# Patient Record
Sex: Female | Born: 1970 | Race: White | Hispanic: No | Smoking: Never smoker
Health system: Southern US, Community
[De-identification: ages and names within clinical notes are randomized; demographics above are authoritative.]

## PROBLEM LIST (undated history)

## (undated) DIAGNOSIS — I219 Acute myocardial infarction, unspecified: Secondary | ICD-10-CM

## (undated) DIAGNOSIS — I2510 Atherosclerotic heart disease of native coronary artery without angina pectoris: Secondary | ICD-10-CM

## (undated) DIAGNOSIS — W57XXXA Bitten or stung by nonvenomous insect and other nonvenomous arthropods, initial encounter: Secondary | ICD-10-CM

## (undated) DIAGNOSIS — I739 Peripheral vascular disease, unspecified: Secondary | ICD-10-CM

## (undated) DIAGNOSIS — E119 Type 2 diabetes mellitus without complications: Secondary | ICD-10-CM

## (undated) DIAGNOSIS — I10 Essential (primary) hypertension: Secondary | ICD-10-CM

## (undated) HISTORY — DX: Bitten or stung by nonvenomous insect and other nonvenomous arthropods, initial encounter: W57.XXXA

## (undated) HISTORY — PX: CORONARY ANGIOPLASTY WITH STENT PLACEMENT: SHX49

---

## 2003-11-19 ENCOUNTER — Emergency Department: Payer: Self-pay | Admitting: Emergency Medicine

## 2003-11-25 ENCOUNTER — Ambulatory Visit: Payer: Self-pay | Admitting: Obstetrics & Gynecology

## 2003-12-28 ENCOUNTER — Emergency Department: Payer: Self-pay | Admitting: Emergency Medicine

## 2004-03-22 ENCOUNTER — Emergency Department: Payer: Self-pay | Admitting: Emergency Medicine

## 2005-03-11 ENCOUNTER — Emergency Department: Payer: Self-pay | Admitting: General Practice

## 2006-12-28 ENCOUNTER — Emergency Department: Payer: Self-pay | Admitting: Emergency Medicine

## 2006-12-28 ENCOUNTER — Other Ambulatory Visit: Payer: Self-pay

## 2007-03-19 ENCOUNTER — Emergency Department: Payer: Self-pay | Admitting: Emergency Medicine

## 2009-06-16 DIAGNOSIS — E1142 Type 2 diabetes mellitus with diabetic polyneuropathy: Secondary | ICD-10-CM | POA: Insufficient documentation

## 2009-06-16 DIAGNOSIS — F1010 Alcohol abuse, uncomplicated: Secondary | ICD-10-CM | POA: Insufficient documentation

## 2009-06-16 DIAGNOSIS — F1210 Cannabis abuse, uncomplicated: Secondary | ICD-10-CM | POA: Insufficient documentation

## 2009-08-28 DIAGNOSIS — D234 Other benign neoplasm of skin of scalp and neck: Secondary | ICD-10-CM | POA: Insufficient documentation

## 2011-07-04 DIAGNOSIS — J309 Allergic rhinitis, unspecified: Secondary | ICD-10-CM | POA: Insufficient documentation

## 2011-07-04 DIAGNOSIS — Z1239 Encounter for other screening for malignant neoplasm of breast: Secondary | ICD-10-CM | POA: Insufficient documentation

## 2011-09-13 ENCOUNTER — Ambulatory Visit: Payer: Self-pay

## 2012-04-23 ENCOUNTER — Emergency Department: Payer: Self-pay | Admitting: Internal Medicine

## 2012-04-23 LAB — URINALYSIS, COMPLETE
Bacteria: NONE SEEN
Ph: 5 (ref 4.5–8.0)
Protein: NEGATIVE
Squamous Epithelial: 2
WBC UR: 1 /HPF (ref 0–5)

## 2013-10-05 DIAGNOSIS — N951 Menopausal and female climacteric states: Secondary | ICD-10-CM | POA: Insufficient documentation

## 2013-11-13 DIAGNOSIS — E7849 Other hyperlipidemia: Secondary | ICD-10-CM | POA: Insufficient documentation

## 2013-12-23 ENCOUNTER — Ambulatory Visit: Payer: Self-pay

## 2013-12-27 DIAGNOSIS — L820 Inflamed seborrheic keratosis: Secondary | ICD-10-CM | POA: Insufficient documentation

## 2014-01-15 ENCOUNTER — Ambulatory Visit: Payer: Self-pay

## 2014-09-12 ENCOUNTER — Encounter: Payer: Self-pay | Admitting: *Deleted

## 2014-09-12 ENCOUNTER — Emergency Department
Admission: EM | Admit: 2014-09-12 | Discharge: 2014-09-12 | Disposition: A | Discharge status: Home / Self Care | Payer: Self-pay | Attending: Student | Admitting: Student

## 2014-09-12 DIAGNOSIS — E119 Type 2 diabetes mellitus without complications: Secondary | ICD-10-CM | POA: Insufficient documentation

## 2014-09-12 DIAGNOSIS — B070 Plantar wart: Secondary | ICD-10-CM | POA: Insufficient documentation

## 2014-09-12 DIAGNOSIS — Z87891 Personal history of nicotine dependence: Secondary | ICD-10-CM | POA: Insufficient documentation

## 2014-09-12 HISTORY — DX: Type 2 diabetes mellitus without complications: E11.9

## 2014-09-12 MED ORDER — MELOXICAM 15 MG PO TABS: 15.0000 mg | ORAL_TABLET | Freq: Every day | ORAL | Status: DC

## 2014-09-12 NOTE — ED Notes (Signed)
Pt complains of a painful foot callous on bottom of right foot

## 2014-09-12 NOTE — ED Notes (Signed)
Has small bump on bottom of right foot past several months, states is hard to work

## 2014-09-12 NOTE — Discharge Instructions (Signed)
Plantar Warts  Plantar warts are growths on the bottom of your foot. Warts are caused by a germ.   HOME CARE  · Soak your foot in warm water. Dry your foot when you are done. Remove the top layer of softened skin, then apply any medicine as told by your doctor.  · Remove any bandages daily. File off extra wart tissue. Repeat this as told by your doctor until the wart goes away.  · Only use medicine as told by your doctor.  · Use a bandage with a hole in it (doughnut bandage) to relieve pain. Put the hole over the wart.  · Wear shoes and socks and change them daily.  · Keep your foot clean and dry.  · Check your feet regularly.  · Avoid contact with warts on other people.  · Have your warts checked by your doctor.  GET HELP RIGHT AWAY IF:  The treated skin becomes red, puffy (swollen), or painful.  MAKE SURE YOU:  · Understand these instructions.  · Will watch your condition.  · Will get help right away if you are not doing well or get worse.  Document Released: 03/05/2010 Document Revised: 06/17/2013 Document Reviewed: 03/05/2010  Exit[employer]® Patient Information ©2015 [employer]. This information is not intended to replace advice given to you by your health care provider. Make sure you discuss any questions you have with your health care provider.

## 2014-09-12 NOTE — ED Provider Notes (Signed)
CSN: [id number]     Arrival date & time 09/12/14  1630 History   First MD Initiated Contact with Patient 09/12/14 1725     Chief Complaint  Patient presents with  . Foot Pain     (Consider location/radiation/quality/duration/timing/severity/associated sxs/prior Treatment) HPI  44 year old female presents today for evaluation of right foot pain. Patient states she's had a tender nodule on the plantar aspect of her right foot for 2-3 months. Over the last few days this has become very painful. She describes a sharp pain along the plantar aspect of the right fifth metatarsal. She denies any trauma or injury. She is able to ambulate but only with putting weight on her toes. She has tried cushions with not much relief. She has not tried any anti-inflammatory medications. She denies any fevers warmth redness or recent foreign body secondary to puncture wound.  Past Medical History  Diagnosis Date  . Diabetes mellitus without complication    History reviewed. No pertinent past surgical history. No family history on file. History  Substance Use Topics  . Smoking status: Former Games developer  . Smokeless tobacco: Not on file  . Alcohol Use: 1.2 oz/week    2 Glasses of wine per week   OB History    No data available     Review of Systems  Constitutional: Negative for fever, chills, activity change and fatigue.  HENT: Negative for congestion, sinus pressure and sore throat.   Eyes: Negative for visual disturbance.  Respiratory: Negative for cough, chest tightness and shortness of breath.   Cardiovascular: Negative for chest pain and leg swelling.  Gastrointestinal: Negative for nausea, vomiting, abdominal pain and diarrhea.  Genitourinary: Negative for dysuria.  Musculoskeletal: Negative for arthralgias and gait problem.  Skin: Positive for wound (Plantar wart right foot). Negative for rash.  Neurological: Negative for weakness, numbness and headaches.  Hematological: Negative for adenopathy.   Psychiatric/Behavioral: Negative for behavioral problems, confusion and agitation.      Allergies  Review of patient's allergies indicates no known allergies.  Home Medications   Prior to Admission medications   Medication Sig Start Date End Date Taking? Authorizing Provider  meloxicam (MOBIC) 15 MG tablet Take 1 tablet (15 mg total) by mouth daily. 09/12/14   Evon Slack, PA-C   BP 136/79 mmHg  Pulse 100  Temp(Src) 98.3 F (36.8 C) (Oral)  Resp 18  Ht 5\' 6"  (1.676 m)  Wt 150 lb (68.04 kg)  BMI 24.22 kg/m2  SpO2 98% Physical Exam  Constitutional: She is oriented to person, place, and time. She appears well-developed and well-nourished. No distress.  HENT:  Head: Normocephalic and atraumatic.  Mouth/Throat: Oropharynx is clear and moist.  Eyes: EOM are normal. Pupils are equal, round, and reactive to light. Right eye exhibits no discharge. Left eye exhibits no discharge.  Neck: Normal range of motion. Neck supple.  Cardiovascular: Normal rate and intact distal pulses.   Pulmonary/Chest: No respiratory distress.  Abdominal: Soft.  Musculoskeletal: Normal range of motion. She exhibits no edema.  Neurological: She is alert and oriented to person, place, and time. She has normal reflexes.  Skin: Skin is warm and dry.  Plantar aspect of the right foot at the fifth metatarsal shows a 1 x 1 cm raised nodule with induration and tenderness. There is no open wound, warmth, erythema, drainage. Patient has no signs of cellulitis. She has normal ankle range of motion.  Psychiatric: She has a normal mood and affect. Her behavior is normal. Thought content normal.  ED Course  Procedures (including critical care time) Labs Review Labs Reviewed - No data to display  Imaging Review No results found.   EKG Interpretation None      MDM   Final diagnoses:  Plantar wart of right foot    44 year old female with 2-3 month history of right foot plantar wart. No signs of  infection. Patient was given postop shoe to help reduce weight placed on the right lower extremity. He will continue with doughnut Band-Aids/cushions. Start Meloxicam 15 mg 1 tab by mouth daily. Follow-up with podiatrist.    Evon Slack, PA-C 09/12/14 1734  Gayla Doss, MD 09/12/14 [phone]

## 2015-06-26 ENCOUNTER — Encounter: Payer: Self-pay | Admitting: Podiatry

## 2015-06-26 ENCOUNTER — Ambulatory Visit (INDEPENDENT_AMBULATORY_CARE_PROVIDER_SITE_OTHER): Payer: Self-pay | Admitting: Podiatry

## 2015-06-26 VITALS — BP 166/95 | HR 78 | Resp 16

## 2015-06-26 DIAGNOSIS — B070 Plantar wart: Secondary | ICD-10-CM

## 2015-06-26 NOTE — Progress Notes (Signed)
Subjective:     Patient ID: Claudia Mills, female   DOB: [birth year], 45 y.o.   MRN: [id number]  HPI patient states she has a painful lesion plantar aspect right foot that she's had for a while and it makes it hard to walk on. Patient states she thinks it's towards she's had several others and she does have diabetes which she believes is under good control   Review of Systems  All other systems reviewed and are negative.      Objective:   Physical Exam  Constitutional: She is oriented to person, place, and time.  Cardiovascular: Intact distal pulses.   Musculoskeletal: Normal range of motion.  Neurological: She is oriented to person, place, and time.  Skin: Skin is warm.  Nursing note and vitals reviewed.  neurovascular status intact muscle strength adequate range of motion within normal limits with patient found to have a painful keratotic lesion plantar mid arch area right lateral side with pinpoint bleeding upon debridement and pain to lateral pressure. Patient's noted to have good digital perfusion is well oriented 3 with good hair growth to the digits     Assessment:     What appears to be a chronic lesion plantar aspect right lateral side with pain    Plan:     H&P and condition discussed and I explained difference between possible verruca versus possible porokeratotic type lesion. It is very painful the patient and she wants it removed at this time I infiltrated 60 mg Xylocaine with epinephrine and under sterile conditions I removed the mass and it did appear to be wart tissue and applied a small amount of phenol to the base and sterile dressing. She does not want a set pathology due to cost so I did go ahead and disposable it with again and active look at it indicating verruca plantaris. Patient will be seen back for us to recheck again

## 2015-06-26 NOTE — Progress Notes (Signed)
   Subjective:    Patient ID: Claudia Mills, female    DOB: 06/14/1970, 45 y.o.   MRN: [id number]  HPI    Review of Systems  All other systems reviewed and are negative.      Objective:   Physical Exam        Assessment & Plan:

## 2015-12-30 ENCOUNTER — Emergency Department: Payer: Self-pay

## 2015-12-30 ENCOUNTER — Encounter: Payer: Self-pay | Admitting: *Deleted

## 2015-12-30 ENCOUNTER — Emergency Department
Admission: EM | Admit: 2015-12-30 | Discharge: 2015-12-31 | Disposition: A | Discharge status: Home / Self Care | Payer: Self-pay | Attending: Emergency Medicine | Admitting: Emergency Medicine

## 2015-12-30 DIAGNOSIS — Z791 Long term (current) use of non-steroidal anti-inflammatories (NSAID): Secondary | ICD-10-CM | POA: Insufficient documentation

## 2015-12-30 DIAGNOSIS — K852 Alcohol induced acute pancreatitis without necrosis or infection: Secondary | ICD-10-CM | POA: Insufficient documentation

## 2015-12-30 DIAGNOSIS — E119 Type 2 diabetes mellitus without complications: Secondary | ICD-10-CM

## 2015-12-30 DIAGNOSIS — E1165 Type 2 diabetes mellitus with hyperglycemia: Secondary | ICD-10-CM | POA: Insufficient documentation

## 2015-12-30 DIAGNOSIS — Z87891 Personal history of nicotine dependence: Secondary | ICD-10-CM | POA: Insufficient documentation

## 2015-12-30 DIAGNOSIS — R739 Hyperglycemia, unspecified: Secondary | ICD-10-CM

## 2015-12-30 LAB — URINALYSIS COMPLETE WITH MICROSCOPIC (ARMC ONLY)
BACTERIA UA: NONE SEEN
BILIRUBIN URINE: NEGATIVE
Glucose, UA: >500 mg/dL — AB
HGB URINE DIPSTICK: NEGATIVE
Ketones, ur: NEGATIVE mg/dL
LEUKOCYTES UA: NEGATIVE
Nitrite: NEGATIVE
PH: 6 (ref 5.0–8.0)
PROTEIN: NEGATIVE mg/dL
RBC / HPF: NONE SEEN RBC/hpf (ref 0–5)
Specific Gravity, Urine: 1.006 (ref 1.005–1.030)

## 2015-12-30 LAB — BASIC METABOLIC PANEL
Anion gap: 12 (ref 5–15)
BUN: 6 mg/dL (ref 6–20)
CHLORIDE: 97 mmol/L — AB (ref 101–111)
CO2: 25 mmol/L (ref 22–32)
CREATININE: 0.6 mg/dL (ref 0.44–1.00)
Calcium: 9.3 mg/dL (ref 8.9–10.3)
GFR calc Af Amer: >60 mL/min (ref >60)
GFR calc non Af Amer: >60 mL/min (ref >60)
Glucose, Bld: 378 mg/dL — ABNORMAL HIGH (ref 65–99)
POTASSIUM: 3.5 mmol/L (ref 3.5–5.1)
Sodium: 134 mmol/L — ABNORMAL LOW (ref 135–145)

## 2015-12-30 LAB — CBC WITH DIFFERENTIAL/PLATELET
Basophils Absolute: 0.1 10*3/uL (ref 0–0.1)
Basophils Relative: 1 %
EOS PCT: 2 %
Eosinophils Absolute: 0.2 10*3/uL (ref 0–0.7)
HEMATOCRIT: 46.2 % (ref 35.0–47.0)
Hemoglobin: 16.2 g/dL — ABNORMAL HIGH (ref 12.0–16.0)
LYMPHS ABS: 2.7 10*3/uL (ref 1.0–3.6)
LYMPHS PCT: 28 %
MCH: 34 pg (ref 26.0–34.0)
MCHC: 35 g/dL (ref 32.0–36.0)
MCV: 97.3 fL (ref 80.0–100.0)
MONO ABS: 0.5 10*3/uL (ref 0.2–0.9)
Monocytes Relative: 6 %
NEUTROS ABS: 6 10*3/uL (ref 1.4–6.5)
Neutrophils Relative %: 63 %
PLATELETS: 191 10*3/uL (ref 150–440)
RBC: 4.75 MIL/uL (ref 3.80–5.20)
RDW: 12.3 % (ref 11.5–14.5)
WBC: 9.4 10*3/uL (ref 3.6–11.0)

## 2015-12-30 LAB — POCT PREGNANCY, URINE: PREG TEST UR: NEGATIVE

## 2015-12-30 LAB — LIPASE, BLOOD: Lipase: 81 U/L — ABNORMAL HIGH (ref 11–51)

## 2015-12-30 MED ORDER — SODIUM CHLORIDE 0.9 % IV BOLUS (SEPSIS)
1000.0000 mL | Freq: Once | INTRAVENOUS | Status: AC
  Administered 2015-12-30: 1000 mL via INTRAVENOUS

## 2015-12-30 MED ORDER — IOPAMIDOL (ISOVUE-300) INJECTION 61%
30.0000 mL | Freq: Once | INTRAVENOUS | Status: AC | PRN
  Administered 2015-12-30: 30 mL via ORAL

## 2015-12-30 MED ORDER — IOPAMIDOL (ISOVUE-300) INJECTION 61%
100.0000 mL | Freq: Once | INTRAVENOUS | Status: AC | PRN
  Administered 2015-12-30: 100 mL via INTRAVENOUS

## 2015-12-30 NOTE — ED Provider Notes (Signed)
[facility] Emergency Department Provider Note  ____________________________________________  Time seen: 10:45 PM  I have reviewed the triage vital signs and the nursing notes.   HISTORY  Chief Complaint Urinary Frequency    HPI Claudia Mills is a 45 y.o. female presents with intermittent left lower quadrant abdominal pain 3 days. Patient also admits to history of diabetes for which she's been noncompliant with medications secondary to cost times one year. Patient admits to urinary frequency however no dysuria. Patient admits to daily EtOH ingestion most recent ingestion before presentation to the emergency department.     Past Medical History:  Diagnosis Date  . Diabetes mellitus without complication (HCC)     There are no active problems to display for this patient.   Past surgical history None  Current Outpatient Rx  . Order #: [id number] Class: Print    Allergies No known drug allergies History reviewed. No pertinent family history.  Social History Social History  Substance Use Topics  . Smoking status: Former Games developermoker  . Smokeless tobacco: Never Used  . Alcohol use 1.2 oz/week    2 Glasses of wine per week    Review of Systems  Constitutional: Negative for fever. Eyes: Negative for visual changes. ENT: Negative for sore throat. Cardiovascular: Negative for chest pain. Respiratory: Negative for shortness of breath. Gastrointestinal: Negative for abdominal pain, vomiting and diarrhea. Genitourinary: Negative for dysuria. Musculoskeletal: Negative for back pain. Skin: Negative for rash. Neurological: Negative for headaches, focal weakness or numbness. Psychiatric: Positive for   10-point ROS otherwise negative.  ____________________________________________   PHYSICAL EXAM:  VITAL SIGNS: ED Triage Vitals  Enc Vitals Group     BP 12/30/15 2045 (!) 142/81     Pulse Rate 12/30/15 2045 (!) 121     Resp 12/30/15 2045 20      Temp 12/30/15 2045 98.1 F (36.7 C)     Temp Source 12/30/15 2045 Oral     SpO2 12/30/15 2045 99 %     Weight 12/30/15 2045 143 lb (64.9 kg)     Height 12/30/15 2045 5\' 6"  (1.676 m)     Head Circumference --      Peak Flow --      Pain Score 12/30/15 2046 5     Pain Loc --      Pain Edu? --      Excl. in GC? --      Constitutional: Alert and oriented. Well appearing and in no distress. Eyes: Conjunctivae are normal. PERRL. Normal extraocular movements. ENT   Head: Normocephalic and atraumatic.   Nose: No congestion/rhinnorhea.   Mouth/Throat: Mucous membranes are moist.   Neck: No stridor. Hematological/Lymphatic/Immunilogical: No cervical lymphadenopathy. Cardiovascular: Normal rate, regular rhythm. Normal and symmetric distal pulses are present in all extremities. No murmurs, rubs, or gallops. Respiratory: Normal respiratory effort without tachypnea nor retractions. Breath sounds are clear and equal bilaterally. No wheezes/rales/rhonchi. Gastrointestinal: Soft and nontender. No distention. There is no CVA tenderness. Genitourinary: deferred Musculoskeletal: Nontender with normal range of motion in all extremities. No joint effusions.  No lower extremity tenderness nor edema. Neurologic:  Normal speech and language. No gross focal neurologic deficits are appreciated. Speech is normal.  Skin:  Skin is warm, dry and intact. No rash noted. Psychiatric: Mood and affect are normal. Speech and behavior are normal. Patient exhibits appropriate insight and judgment.  ____________________________________________    LABS (pertinent positives/negatives)  Labs Reviewed  BASIC METABOLIC PANEL - Abnormal; Notable for the following:  Result Value   Sodium 134 (*)    Chloride 97 (*)    Glucose, Bld 378 (*)    All other components within normal limits  LIPASE, BLOOD - Abnormal; Notable for the following:    Lipase 81 (*)    All other components within normal limits   CBC WITH DIFFERENTIAL/PLATELET - Abnormal; Notable for the following:    Hemoglobin 16.2 (*)    All other components within normal limits  URINALYSIS COMPLETEWITH MICROSCOPIC ([facility] ONLY) - Abnormal; Notable for the following:    Color, Urine COLORLESS (*)    APPearance CLEAR (*)    Glucose, UA >500 (*)    Squamous Epithelial / LPF 0-5 (*)    All other components within normal limits  GLUCOSE, CAPILLARY - Abnormal; Notable for the following:    Glucose-Capillary 295 (*)    All other components within normal limits  GLUCOSE, CAPILLARY - Abnormal; Notable for the following:    Glucose-Capillary 248 (*)    All other components within normal limits  POC URINE PREG, ED  POCT PREGNANCY, URINE      RADIOLOGY  CLINICAL DATA:  Acute onset of left lower abdominal pain and increased urinary frequency. Intermittent back pain and nausea. Initial encounter.  EXAM: CT ABDOMEN AND PELVIS WITH CONTRAST  TECHNIQUE: Multidetector CT imaging of the abdomen and pelvis was performed using the standard protocol following bolus administration of intravenous contrast.  CONTRAST:  ISOVUE-300 IOPAMIDOL (ISOVUE-300) INJECTION 61%  COMPARISON:  Left hip radiographs performed 04/23/2012  FINDINGS: Lower chest: The visualized lung bases are grossly clear. The visualized portions of the mediastinum are unremarkable.  Hepatobiliary: The liver is unremarkable in appearance. The gallbladder is unremarkable in appearance. The common bile duct remains normal in caliber.  Pancreas: The pancreas is within normal limits.  Spleen: The spleen is unremarkable in appearance.  Adrenals/Urinary Tract: The adrenal glands are unremarkable in appearance. The kidneys are within normal limits. There is no evidence of hydronephrosis. No renal or ureteral stones are identified. No perinephric stranding is seen.  Stomach/Bowel: The stomach is unremarkable in appearance. The small bowel is within  normal limits. The appendix is normal in caliber, without evidence of appendicitis. The colon is unremarkable in appearance.  Vascular/Lymphatic: Scattered calcification is seen along the abdominal aorta and its branches. The abdominal aorta is otherwise grossly unremarkable. The inferior vena cava is grossly unremarkable. No retroperitoneal lymphadenopathy is seen. No pelvic sidewall lymphadenopathy is identified.  Reproductive: The bladder is moderately distended and within normal limits. The uterus is grossly unremarkable in appearance. The ovaries are relatively symmetric. No suspicious adnexal masses are seen. Bilateral tubal ligation clips are noted.  Other: No additional soft tissue abnormalities are seen.  Musculoskeletal: No acute osseous abnormalities are identified. Multilevel vacuum phenomenon is noted along the lower lumbar spine, with associated sclerosis. Air is noted tracking within an underlying disc extrusion, likely arising at L4-L5. The visualized musculature is unremarkable in appearance.  IMPRESSION: 1. No acute abnormality seen within the abdomen or pelvis. 2. Scattered aortic atherosclerosis. 3. Mild degenerative change along the lower lumbar spine.     Procedures    INITIAL IMPRESSION / ASSESSMENT AND PLAN / ED COURSE  Pertinent labs & imaging results that were available during my care of the patient were reviewed by me and considered in my medical decision making (see chart for details).  NS 2 liters given as well as SQ insulin 6 units repeat glucose 248. Patient prescribed glipizide and [facility] and management.  ____________________________________________   FINAL CLINICAL IMPRESSION(S) / ED DIAGNOSES  Final diagnoses:  Hyperglycemia  Type 2 diabetes mellitus without complication, without long-term current use of insulin (HCC)  Alcohol-induced acute pancreatitis without  infection or necrosis      Darci Currentandolph N Brown, MD 12/31/15 [phone]

## 2015-12-30 NOTE — ED Triage Notes (Signed)
Pt has left lower abd pain and urinary frequency for 2-3 days.  No vag bleeding.  Pt reports vag discharge.  Pt is diabetic, no meds for 1 year.   Intermittent back pain.  Pt has nausea.  Pt alert.

## 2015-12-31 LAB — GLUCOSE, CAPILLARY
Glucose-Capillary: 248 mg/dL — ABNORMAL HIGH (ref 65–99)
Glucose-Capillary: 295 mg/dL — ABNORMAL HIGH (ref 65–99)

## 2015-12-31 MED ORDER — GLIPIZIDE 5 MG PO TABS: 5.0000 mg | ORAL_TABLET | Freq: Every day | ORAL | 0 refills | Status: DC

## 2015-12-31 MED ORDER — INSULIN ASPART 100 UNIT/ML ~~LOC~~ SOLN
SUBCUTANEOUS | Status: AC
  Administered 2015-12-31: 6 [IU] via SUBCUTANEOUS
  Filled 2015-12-31: qty 6

## 2015-12-31 MED ORDER — INSULIN ASPART 100 UNIT/ML ~~LOC~~ SOLN
6.0000 [IU] | Freq: Once | SUBCUTANEOUS | Status: AC
  Administered 2015-12-31: 6 [IU] via SUBCUTANEOUS

## 2016-05-18 ENCOUNTER — Encounter: Payer: Self-pay | Admitting: Emergency Medicine

## 2016-05-18 ENCOUNTER — Emergency Department: Payer: Self-pay

## 2016-05-18 ENCOUNTER — Emergency Department
Admission: EM | Admit: 2016-05-18 | Discharge: 2016-05-18 | Disposition: A | Discharge status: Home / Self Care | Payer: Self-pay | Attending: Student in an Organized Health Care Education/Training Program | Admitting: Student in an Organized Health Care Education/Training Program

## 2016-05-18 DIAGNOSIS — Z87891 Personal history of nicotine dependence: Secondary | ICD-10-CM | POA: Insufficient documentation

## 2016-05-18 DIAGNOSIS — J069 Acute upper respiratory infection, unspecified: Secondary | ICD-10-CM | POA: Insufficient documentation

## 2016-05-18 DIAGNOSIS — E119 Type 2 diabetes mellitus without complications: Secondary | ICD-10-CM | POA: Insufficient documentation

## 2016-05-18 MED ORDER — BENZONATATE 100 MG PO CAPS: 100.0000 mg | ORAL_CAPSULE | Freq: Three times a day (TID) | ORAL | 0 refills | Status: DC | PRN

## 2016-05-18 MED ORDER — FLUTICASONE PROPIONATE 50 MCG/ACT NA SUSP: 1.0000 | Freq: Two times a day (BID) | NASAL | 0 refills | Status: DC

## 2016-05-18 MED ORDER — LORATADINE 10 MG PO TABS: 10.0000 mg | ORAL_TABLET | Freq: Every day | ORAL | 0 refills | Status: DC

## 2016-05-18 NOTE — ED Provider Notes (Signed)
[facility] Emergency Department Provider Note  ____________________________________________  Time seen: Approximately 6:24 PM  I have reviewed the triage vital signs and the nursing notes.   HISTORY  Chief Complaint Influenza    HPI Claudia Mills is a 46 y.o. female who presents emergency department for several day history of chills, body aches, nasal congestion, coughing. Patient's states that symptoms began gradually but has increased over the past several days. Patient reports that she drinks significant amounts of caffeine and has not had any over the last 2 days and has developed a headache from same. She denies any visual changes, neck pain or stiffness, chest pain, shortness of breath, abdominal pain, nausea vomiting. Patient states that she had a bad fall approximately a month and a half ago and injured her left ribs and has residual left rib pain from same. No other pain complaint. No other complaints. Patient is tried NyQuil and DayQuil for her symptoms with mild relief.   Past Medical History:  Diagnosis Date  . Diabetes mellitus without complication (HCC)     There are no active problems to display for this patient.   History reviewed. No pertinent surgical history.  Prior to Admission medications   Medication Sig Start Date End Date Taking? Authorizing Provider  benzonatate (TESSALON) 100 MG capsule Take 1 capsule (100 mg total) by mouth 3 (three) times daily as needed for cough. 05/18/16   Delorise Royals Cuthriell, PA-C  fluticasone (FLONASE) 50 MCG/ACT nasal spray Place 1 spray into both nostrils 2 (two) times daily. 05/18/16   Delorise Royals Cuthriell, PA-C  glipiZIDE (GLUCOTROL) 5 MG tablet Take 1 tablet (5 mg total) by mouth daily before breakfast. 12/31/15 03/30/16  Darci Current, MD  loratadine (CLARITIN) 10 MG tablet Take 1 tablet (10 mg total) by mouth daily. 05/18/16   Delorise Royals Cuthriell, PA-C  meloxicam (MOBIC) 15 MG tablet Take 1 tablet (15 mg  total) by mouth daily. 09/12/14   Evon Slack, PA-C    Allergies Patient has no known allergies.  No family history on file.  Social History Social History  Substance Use Topics  . Smoking status: Former Games developer  . Smokeless tobacco: Never Used  . Alcohol use 1.2 oz/week    2 Glasses of wine per week     Review of Systems  Constitutional: No definitive fever, positive for chills Eyes: No visual changes. No discharge ENT: Nasal congestion upper respiratory complaints. Cardiovascular: no chest pain. Respiratory: Positive cough. No SOB. Gastrointestinal: No abdominal pain.  No nausea, no vomiting.  No diarrhea.  No constipation. Musculoskeletal: Negative for musculoskeletal pain. Skin: Negative for rash, abrasions, lacerations, ecchymosis. Neurological: Negative for headaches, focal weakness or numbness. 10-point ROS otherwise negative.  ____________________________________________   PHYSICAL EXAM:  VITAL SIGNS: ED Triage Vitals  Enc Vitals Group     BP 05/18/16 1759 (!) 168/74     Pulse Rate 05/18/16 1759 (!) 109     Resp 05/18/16 1759 16     Temp --      Temp src --      SpO2 05/18/16 1759 98 %     Weight --      Height --      Head Circumference --      Peak Flow --      Pain Score 05/18/16 1757 5     Pain Loc --      Pain Edu? --      Excl. in GC? --  Constitutional: Alert and oriented. Well appearing and in no acute distress. Eyes: Conjunctivae are normal. PERRL. EOMI. Head: Atraumatic. ENT:      Ears: EACs unremarkable bilaterally. TMs are mildly bulging bilaterally. No air-fluid level.      Nose: Moderate congestion/rhinnorhea.      Mouth/Throat: Mucous membranes are moist. Oropharynx is mildly erythematous but nonedematous. She is midline. Neck: No stridor. Neck is supple for range of motion Hematological/Lymphatic/Immunilogical: No cervical lymphadenopathy. Cardiovascular: Normal rate, regular rhythm. Normal S1 and S2.  Good peripheral  circulation. Respiratory: Normal respiratory effort without tachypnea or retractions. Lungs CTAB. Good air entry to the bases with no decreased or absent breath sounds. Musculoskeletal: Full range of motion to all extremities. No gross deformities appreciated. Neurologic:  Normal speech and language. No gross focal neurologic deficits are appreciated.  Skin:  Skin is warm, dry and intact. No rash noted. Psychiatric: Mood and affect are normal. Speech and behavior are normal. Patient exhibits appropriate insight and judgement.   ____________________________________________   LABS (all labs ordered are listed, but only abnormal results are displayed)  Labs Reviewed - No data to display ____________________________________________  EKG   ____________________________________________  RADIOLOGY Festus Barren Cuthriell, personally viewed and evaluated these images (plain radiographs) as part of my medical decision making, as well as reviewing the written report by the radiologist.  Dg Chest 2 View  Result Date: 05/18/2016 CLINICAL DATA:  46 year old presenting with a 3 day history of chills and left-sided chest pain. EXAM: CHEST  2 VIEW COMPARISON:  12/28/2006. FINDINGS: Cardiomediastinal silhouette unremarkable, unchanged. Lungs clear. Bronchovascular markings normal. Pulmonary vascularity normal. No visible pleural effusions. No pneumothorax. Visualized bony thorax intact. IMPRESSION: No acute cardiopulmonary disease. Electronically Signed   By: Hulan Saas M.D.   On: 05/18/2016 18:51    ____________________________________________    PROCEDURES  Procedure(s) performed:    Procedures    Medications - No data to display   ____________________________________________   INITIAL IMPRESSION / ASSESSMENT AND PLAN / ED COURSE  Pertinent labs & imaging results that were available during my care of the patient were reviewed by me and considered in my medical decision making  (see chart for details).  Review of the [place] CSRS was performed in accordance of the [facility] prior to dispensing any controlled drugs.     Patient's diagnosis is consistent with viral respiratory infection. Chest x-ray reveals no areas of consolidation consistent with pneumonia. Exam is reassuring. No indication for further imaging or labs.. Patient will be discharged home with prescriptions for symptom control medications. Patient is to follow up with primary care as needed or otherwise directed. Patient is given ED precautions to return to the ED for any worsening or new symptoms.     ____________________________________________  FINAL CLINICAL IMPRESSION(S) / ED DIAGNOSES  Final diagnoses:  Viral upper respiratory tract infection      NEW MEDICATIONS STARTED DURING THIS VISIT:  New Prescriptions   BENZONATATE (TESSALON) 100 MG CAPSULE    Take 1 capsule (100 mg total) by mouth 3 (three) times daily as needed for cough.   FLUTICASONE (FLONASE) 50 MCG/ACT NASAL SPRAY    Place 1 spray into both nostrils 2 (two) times daily.   LORATADINE (CLARITIN) 10 MG TABLET    Take 1 tablet (10 mg total) by mouth daily.        This chart was dictated using voice recognition software/Dragon. Despite best efforts to proofread, errors can occur which can change the meaning. Any change was purely unintentional.  Delorise Royals Cuthriell, PA-C 05/18/16 1918    Willy Eddy, MD 05/18/16 2037

## 2016-05-18 NOTE — ED Notes (Signed)
Pt discharged to home.  Family member driving.  Discharge instructions reviewed.  Verbalized understanding.  No questions or concerns at this time.  Teach back verified.  Pt in NAD.  No items left in ED.   

## 2016-05-18 NOTE — ED Notes (Signed)
Patient denies pain and is resting comfortably.  

## 2016-05-18 NOTE — ED Notes (Signed)
AAOx3.  Skin warm and dry.  C/O intermittent chills, onset of symptoms Sunday evening.  Denies SOB/ DOE.  Also c/o left hip pain intermittently.

## 2016-05-18 NOTE — ED Triage Notes (Signed)
Patient presents to ED via POV with c/o flu-like symptoms since Sunday. Mask applied to patient. Patient also c/o left rib cage pain. Patient states this is a chronic pain from a fall she had in January. Ambulatory to triage. Even and non labored respirations noted.

## 2016-06-14 ENCOUNTER — Telehealth: Payer: Self-pay | Admitting: Nurse Practitioner

## 2016-06-14 NOTE — Telephone Encounter (Signed)
Pt wants to be called back

## 2016-10-04 DIAGNOSIS — Z0000 Encounter for general adult medical examination without abnormal findings: Secondary | ICD-10-CM | POA: Insufficient documentation

## 2016-11-07 ENCOUNTER — Ambulatory Visit: Payer: Self-pay | Attending: Oncology | Admitting: *Deleted

## 2016-11-07 ENCOUNTER — Ambulatory Visit
Admission: RE | Admit: 2016-11-07 | Discharge: 2016-11-07 | Disposition: A | Discharge status: Home / Self Care | Payer: Self-pay | Source: Ambulatory Visit | Attending: Oncology | Admitting: Oncology

## 2016-11-07 VITALS — BP 181/109 | HR 112 | Temp 98.3°F | Ht 66.0 in | Wt 152.0 lb

## 2016-11-07 DIAGNOSIS — Z0000 Encounter for general adult medical examination without abnormal findings: Secondary | ICD-10-CM

## 2016-11-07 NOTE — Patient Instructions (Signed)

## 2016-11-08 ENCOUNTER — Encounter: Payer: Self-pay | Admitting: *Deleted

## 2016-11-08 NOTE — Progress Notes (Signed)
Subjective:     Patient ID: Claudia Mills, female   DOB: 07/02/1970, 46 y.o.   MRN: [id number]  HPI   Review of Systems     Objective:   Physical Exam  Pulmonary/Chest: Right breast exhibits no inverted nipple, no mass, no nipple discharge, no skin change and no tenderness. Left breast exhibits no inverted nipple, no mass, no nipple discharge, no skin change and no tenderness. Breasts are symmetrical.       Assessment:     46 year old female referred to [facility] by the Jeralyn Ruths for clinical breast exam and mammogram.  Clinical breast exam unremarkable.  Taught self breast awareness.  Last pap on 10/04/16 was negative / no HPV co-testing.  Next pap due in 2021.  Blood pressure elevated at 181/109.  States she has not taken her blood pressure meds.  She is to take them as soon as possible and then recheck her blood pressure at [facility] or [facility], and if remains higher than 140/90 she is to follow-up with her primary care provider.  Hand out on hypertention given to patient.Patient has been screened for eligibility.  She does not have any insurance, Medicare or Medicaid.  She also meets financial eligibility.  Hand-out given on the Affordable Care Act.    Plan:     Screening mammogram ordered.  Will follow-up per [facility] protocol.

## 2016-11-09 ENCOUNTER — Encounter: Payer: Self-pay | Admitting: *Deleted

## 2016-11-09 NOTE — Progress Notes (Signed)
Letter mailed from the [facility] to inform patient of her normal mammogram results.  Patient is to follow-up with annual screening in one year.  HSIS to [place].

## 2018-12-28 ENCOUNTER — Other Ambulatory Visit: Payer: Self-pay

## 2018-12-28 DIAGNOSIS — Z20822 Contact with and (suspected) exposure to covid-19: Secondary | ICD-10-CM

## 2018-12-31 LAB — NOVEL CORONAVIRUS, NAA: SARS-CoV-2, NAA: NOT DETECTED

## 2019-01-01 ENCOUNTER — Telehealth: Payer: Self-pay

## 2019-01-01 NOTE — Telephone Encounter (Signed)
Patient given negative result and verbalized understanding

## 2019-01-31 DIAGNOSIS — I214 Non-ST elevation (NSTEMI) myocardial infarction: Secondary | ICD-10-CM | POA: Insufficient documentation

## 2019-01-31 DIAGNOSIS — I10 Essential (primary) hypertension: Secondary | ICD-10-CM | POA: Diagnosis present

## 2019-01-31 DIAGNOSIS — E1165 Type 2 diabetes mellitus with hyperglycemia: Secondary | ICD-10-CM | POA: Insufficient documentation

## 2019-01-31 DIAGNOSIS — I200 Unstable angina: Secondary | ICD-10-CM | POA: Insufficient documentation

## 2019-02-06 DIAGNOSIS — I2510 Atherosclerotic heart disease of native coronary artery without angina pectoris: Secondary | ICD-10-CM | POA: Insufficient documentation

## 2019-03-06 DIAGNOSIS — M545 Low back pain, unspecified: Secondary | ICD-10-CM | POA: Insufficient documentation

## 2019-03-06 DIAGNOSIS — G4700 Insomnia, unspecified: Secondary | ICD-10-CM | POA: Insufficient documentation

## 2019-03-06 DIAGNOSIS — R8290 Unspecified abnormal findings in urine: Secondary | ICD-10-CM | POA: Insufficient documentation

## 2019-03-06 DIAGNOSIS — K5900 Constipation, unspecified: Secondary | ICD-10-CM | POA: Insufficient documentation

## 2019-03-06 DIAGNOSIS — R229 Localized swelling, mass and lump, unspecified: Secondary | ICD-10-CM | POA: Insufficient documentation

## 2019-03-22 ENCOUNTER — Encounter: Payer: Self-pay | Attending: Family Medicine

## 2020-04-09 ENCOUNTER — Other Ambulatory Visit: Payer: Self-pay

## 2020-04-09 ENCOUNTER — Emergency Department: Payer: No Typology Code available for payment source

## 2020-04-09 ENCOUNTER — Emergency Department
Admission: EM | Admit: 2020-04-09 | Discharge: 2020-04-09 | Disposition: A | Discharge status: Home / Self Care | Payer: No Typology Code available for payment source | Attending: Emergency Medicine | Admitting: Emergency Medicine

## 2020-04-09 ENCOUNTER — Encounter: Payer: Self-pay | Admitting: *Deleted

## 2020-04-09 DIAGNOSIS — E119 Type 2 diabetes mellitus without complications: Secondary | ICD-10-CM | POA: Insufficient documentation

## 2020-04-09 DIAGNOSIS — Y92410 Unspecified street and highway as the place of occurrence of the external cause: Secondary | ICD-10-CM | POA: Diagnosis not present

## 2020-04-09 DIAGNOSIS — Z7984 Long term (current) use of oral hypoglycemic drugs: Secondary | ICD-10-CM | POA: Diagnosis not present

## 2020-04-09 DIAGNOSIS — Z87891 Personal history of nicotine dependence: Secondary | ICD-10-CM | POA: Diagnosis not present

## 2020-04-09 DIAGNOSIS — S39012A Strain of muscle, fascia and tendon of lower back, initial encounter: Secondary | ICD-10-CM | POA: Diagnosis not present

## 2020-04-09 DIAGNOSIS — S34109A Unspecified injury to unspecified level of lumbar spinal cord, initial encounter: Secondary | ICD-10-CM | POA: Diagnosis present

## 2020-04-09 MED ORDER — MELOXICAM 7.5 MG PO TABS
15.0000 mg | ORAL_TABLET | Freq: Once | ORAL | Status: AC
  Administered 2020-04-09: 15 mg via ORAL
  Filled 2020-04-09: qty 2

## 2020-04-09 MED ORDER — MELOXICAM 15 MG PO TABS: 15.0000 mg | ORAL_TABLET | Freq: Every day | ORAL | 0 refills | Status: DC

## 2020-04-09 MED ORDER — METHOCARBAMOL 500 MG PO TABS: 500.0000 mg | ORAL_TABLET | Freq: Four times a day (QID) | ORAL | 0 refills | Status: DC

## 2020-04-09 MED ORDER — METHOCARBAMOL 500 MG PO TABS
1000.0000 mg | ORAL_TABLET | Freq: Once | ORAL | Status: AC
  Administered 2020-04-09: 1000 mg via ORAL
  Filled 2020-04-09: qty 2
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
## 2021-02-11 ENCOUNTER — Other Ambulatory Visit: Payer: Self-pay

## 2021-02-11 ENCOUNTER — Emergency Department
Admission: EM | Admit: 2021-02-11 | Discharge: 2021-02-11 | Disposition: A | Discharge status: Home / Self Care | Payer: Self-pay | Attending: Emergency Medicine | Admitting: Emergency Medicine

## 2021-02-11 ENCOUNTER — Emergency Department: Payer: Self-pay

## 2021-02-11 ENCOUNTER — Encounter: Payer: Self-pay | Admitting: Emergency Medicine

## 2021-02-11 DIAGNOSIS — Z87891 Personal history of nicotine dependence: Secondary | ICD-10-CM | POA: Insufficient documentation

## 2021-02-11 DIAGNOSIS — I2510 Atherosclerotic heart disease of native coronary artery without angina pectoris: Secondary | ICD-10-CM | POA: Insufficient documentation

## 2021-02-11 DIAGNOSIS — Z7984 Long term (current) use of oral hypoglycemic drugs: Secondary | ICD-10-CM | POA: Insufficient documentation

## 2021-02-11 DIAGNOSIS — E119 Type 2 diabetes mellitus without complications: Secondary | ICD-10-CM | POA: Insufficient documentation

## 2021-02-11 DIAGNOSIS — Z79899 Other long term (current) drug therapy: Secondary | ICD-10-CM | POA: Insufficient documentation

## 2021-02-11 DIAGNOSIS — Z7982 Long term (current) use of aspirin: Secondary | ICD-10-CM | POA: Insufficient documentation

## 2021-02-11 DIAGNOSIS — I10 Essential (primary) hypertension: Secondary | ICD-10-CM | POA: Insufficient documentation

## 2021-02-11 DIAGNOSIS — Z760 Encounter for issue of repeat prescription: Secondary | ICD-10-CM | POA: Insufficient documentation

## 2021-02-11 DIAGNOSIS — G8929 Other chronic pain: Secondary | ICD-10-CM | POA: Insufficient documentation

## 2021-02-11 DIAGNOSIS — M25511 Pain in right shoulder: Secondary | ICD-10-CM | POA: Insufficient documentation

## 2021-02-11 MED ORDER — NAPROXEN 500 MG PO TABS
500.0000 mg | ORAL_TABLET | Freq: Once | ORAL | Status: AC
  Administered 2021-02-11: 09:00:00 500 mg via ORAL
  Filled 2021-02-11: qty 1

## 2021-02-11 MED ORDER — LIDOCAINE 5 % EX PTCH
1.0000 | MEDICATED_PATCH | CUTANEOUS | Status: DC
  Administered 2021-02-11: 09:00:00 1 via TRANSDERMAL
  Filled 2021-02-11: qty 1

## 2021-02-11 MED ORDER — METOPROLOL SUCCINATE ER 100 MG PO TB24: 100.0000 mg | ORAL_TABLET | Freq: Every day | ORAL | 11 refills | Status: DC

## 2021-02-11 MED ORDER — DICLOFENAC SODIUM 1 % EX GEL: 2.0000 g | Freq: Two times a day (BID) | CUTANEOUS | 1 refills | Status: AC | PRN

## 2021-02-11 MED ORDER — METFORMIN HCL 500 MG PO TABS: 500.0000 mg | ORAL_TABLET | Freq: Two times a day (BID) | ORAL | 0 refills | Status: DC

## 2021-02-11 MED ORDER — ATORVASTATIN CALCIUM 80 MG PO TABS: 80.0000 mg | ORAL_TABLET | Freq: Every day | ORAL | 2 refills | Status: DC

## 2021-02-11 MED ORDER — ASPIRIN 81 MG PO CHEW: 81.0000 mg | CHEWABLE_TABLET | Freq: Every day | ORAL | 0 refills | Status: AC

## 2021-02-11 MED ORDER — LISINOPRIL 5 MG PO TABS: 5.0000 mg | ORAL_TABLET | Freq: Every day | ORAL | 11 refills | Status: DC
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
## 2021-02-11 NOTE — ED Triage Notes (Signed)
Pt comes into the ED via POV c/o right shoulder pain.  Pt states she has chronic pain in that shoulder from an initial injury where she dislocated it years ago.  Pt concerned she may have some arthritis in the shoulder.  Pt has full movement and is in NAD.

## 2021-02-11 NOTE — ED Notes (Signed)
Patient discharged to home per MD order. Patient in stable condition, and deemed medically cleared by ED provider for discharge. Discharge instructions reviewed with patient/family using "Teach Back"; verbalized understanding of medication education and administration, and information about follow-up care. Denies further concerns. ° °

## 2021-04-07 ENCOUNTER — Other Ambulatory Visit: Payer: Self-pay

## 2021-04-07 ENCOUNTER — Emergency Department
Admission: EM | Admit: 2021-04-07 | Discharge: 2021-04-07 | Disposition: A | Discharge status: Home / Self Care | Payer: Self-pay | Attending: Emergency Medicine | Admitting: Emergency Medicine

## 2021-04-07 ENCOUNTER — Emergency Department: Payer: Self-pay

## 2021-04-07 DIAGNOSIS — S32020A Wedge compression fracture of second lumbar vertebra, initial encounter for closed fracture: Secondary | ICD-10-CM | POA: Insufficient documentation

## 2021-04-07 DIAGNOSIS — Z7984 Long term (current) use of oral hypoglycemic drugs: Secondary | ICD-10-CM | POA: Insufficient documentation

## 2021-04-07 DIAGNOSIS — E119 Type 2 diabetes mellitus without complications: Secondary | ICD-10-CM | POA: Insufficient documentation

## 2021-04-07 DIAGNOSIS — X58XXXA Exposure to other specified factors, initial encounter: Secondary | ICD-10-CM | POA: Insufficient documentation

## 2021-04-07 DIAGNOSIS — I10 Essential (primary) hypertension: Secondary | ICD-10-CM | POA: Insufficient documentation

## 2021-04-07 HISTORY — DX: Acute myocardial infarction, unspecified: I21.9

## 2021-04-07 MED ORDER — CYCLOBENZAPRINE HCL 5 MG PO TABS: 5.0000 mg | ORAL_TABLET | Freq: Three times a day (TID) | ORAL | 0 refills | Status: DC | PRN

## 2021-04-07 MED ORDER — KETOROLAC TROMETHAMINE 30 MG/ML IJ SOLN
30.0000 mg | Freq: Once | INTRAMUSCULAR | Status: AC
  Administered 2021-04-07: 30 mg via INTRAMUSCULAR
  Filled 2021-04-07: qty 1

## 2021-04-07 MED ORDER — HYDROCODONE-ACETAMINOPHEN 5-325 MG PO TABS: 1.0000 | ORAL_TABLET | Freq: Four times a day (QID) | ORAL | 0 refills | Status: DC | PRN

## 2021-04-07 NOTE — Discharge Instructions (Signed)
Call make an appoint with Dr. Harlow Mares who is the orthopedist on-call today.  His office address and phone number listed on your discharge papers.  Medication was sent to your pharmacy.  Take only as directed and be aware that the combination of the muscle relaxant and pain medication may cause drowsiness and increase your risk for falling.  You may use ice or heat to your back as needed for discomfort.  Do not drive or operate machinery while taking the medication.

## 2021-04-07 NOTE — ED Notes (Signed)
50 yof c/o lower back pain for the past two weeks. The pt denies any injury.

## 2021-04-07 NOTE — ED Triage Notes (Signed)
Pt c/o lower back pain , worse on the left for the past 2 weeks, denies injury , states she was seen at [facility] ED 2/9 for the same and was given medication , states she ran out on Saturday and is having trouble sleeping at night

## 2021-04-07 NOTE — ED Provider Notes (Signed)
[facility] Provider Note    Event Date/Time   First MD Initiated Contact with Patient 04/07/21 [phone]     (approximate)   History   Back Pain   HPI  Claudia Mills is a 51 y.o. female   presents to the ED with complaint of left-sided low back pain for the past 2 weeks.  Patient states she was seen for the same at [facility] ED on 03/25/2021 and given medication.  She states that she was doing well until she ran out of medication on Saturday.  She denies any urinary symptoms, kidney stones or incontinence of bowel or bladder.  Patient continues to ambulate without any assistance and drove herself to the ED.  Patient has a history of hypertension, MI with coronary angioplasty and diabetes mellitus for which she takes metformin and glipizide.      Physical Exam   Triage Vital Signs: ED Triage Vitals  Enc Vitals Group     BP      Pulse      Resp      Temp      Temp src      SpO2      Weight      Height      Head Circumference      Peak Flow      Pain Score      Pain Loc      Pain Edu?      Excl. in [place]?     Most recent vital signs: Vitals:   04/07/21 0920  BP: (!) 173/89  Pulse: (!) 115  Resp: 16  Temp: 98.2 F (36.8 C)  SpO2: 100%     General: Awake, no distress.  CV:  Good peripheral perfusion.  Heart regular rate and rhythm. Resp:  Normal effort.  Lungs are clear bilaterally. Abd:  No distention.  Flat, soft, nontender. Other:  Examination of the back there is no gross deformity and no point tenderness on palpation of the thoracic or lumbar spine.  There is moderate tenderness to the left paravertebral muscles lower lumbar and SI joint area.  Straight leg raises were approximately 45 degrees with minimal discomfort in the lower back.  Good muscle strength bilaterally 5/5.  Reflexes 2+ bilaterally.  Patient is ambulatory without any assistance.   ED Results / Procedures / Treatments   Labs (all labs ordered are listed, but only abnormal results  are displayed) Labs Reviewed - No data to display    RADIOLOGY Lumbar spine x-ray was reviewed by myself is suspicious for a compression fracture a L2.  Radiology report was reviewed and agrees there is a 25% compression fracture at L2 with age undetermined.  CT scan radiology report was reviewed and confirms a 25% compression L2 fracture that is reported as being new as of 04/09/2020.   PROCEDURES:  Critical Care performed:   Procedures   MEDICATIONS ORDERED IN ED: Medications  ketorolac (TORADOL) 30 MG/ML injection 30 mg (30 mg Intramuscular Given 04/07/21 0934)     IMPRESSION / MDM / ASSESSMENT AND PLAN / ED COURSE  I reviewed the triage vital signs and the nursing notes.   Differential diagnosis includes, but is not limited to, degenerative disc disease, low back pain with left leg sciatica, osteoarthritis, compression fracture.   51 year old female presents to the ED with complaint of back pain.  Patient was seen earlier in the month at [facility] for the same.  She states she was doing better until  she ran out of medication over the weekend and has continued to have pain since that time especially at night when she is trying to sleep.  She denies any incontinence of bowel or bladder or recent falls.  Patient has been taking Flexeril and tramadol.  Lumbar spine x-ray shows L2 compression fracture and CT confirms that this is new with 25% loss of height.  Patient was made aware.  She was given follow-up with Dr. Harlow Mares who is the orthopedist on-call.  A prescription for Flexeril 5 mg 1 3 times daily as needed and hydrocodone 1 every 6 hours as needed especially at night if she is having difficulty sleeping due to pain.  She may also use ice or heat to her back as needed for discomfort.       FINAL CLINICAL IMPRESSION(S) / ED DIAGNOSES   Final diagnoses:  Compression fracture of L2 vertebra, initial encounter ([place])     Rx / DC Orders   ED Discharge Orders           Ordered    cyclobenzaprine (FLEXERIL) 5 MG tablet  3 times daily PRN        04/07/21 1233    HYDROcodone-acetaminophen (NORCO/VICODIN) 5-325 MG tablet  Every 6 hours PRN        04/07/21 1233             Note:  This document was prepared using Dragon voice recognition software and may include unintentional dictation errors.   Johnn Hai, PA-C 04/07/21 1336    Rada Hay, MD 04/07/21 [phone]

## 2021-09-20 ENCOUNTER — Emergency Department: Payer: Medicaid Other

## 2021-09-20 ENCOUNTER — Other Ambulatory Visit: Payer: Self-pay

## 2021-09-20 ENCOUNTER — Emergency Department
Admission: EM | Admit: 2021-09-20 | Discharge: 2021-09-20 | Disposition: A | Discharge status: Home / Self Care | Payer: Medicaid Other | Attending: Emergency Medicine | Admitting: Emergency Medicine

## 2021-09-20 DIAGNOSIS — E119 Type 2 diabetes mellitus without complications: Secondary | ICD-10-CM | POA: Insufficient documentation

## 2021-09-20 DIAGNOSIS — M5416 Radiculopathy, lumbar region: Secondary | ICD-10-CM | POA: Insufficient documentation

## 2021-09-20 DIAGNOSIS — M79604 Pain in right leg: Secondary | ICD-10-CM

## 2021-09-20 DIAGNOSIS — I10 Essential (primary) hypertension: Secondary | ICD-10-CM | POA: Insufficient documentation

## 2021-09-20 LAB — CBC WITH DIFFERENTIAL/PLATELET
Abs Immature Granulocytes: 0.03 10*3/uL (ref 0.00–0.07)
Basophils Absolute: 0.1 10*3/uL (ref 0.0–0.1)
Basophils Relative: 1 %
Eosinophils Absolute: 0.1 10*3/uL (ref 0.0–0.5)
Eosinophils Relative: 1 %
HCT: 40 % (ref 36.0–46.0)
Hemoglobin: 14 g/dL (ref 12.0–15.0)
Immature Granulocytes: 1 %
Lymphocytes Relative: 34 %
Lymphs Abs: 2.2 10*3/uL (ref 0.7–4.0)
MCH: 34.4 pg — ABNORMAL HIGH (ref 26.0–34.0)
MCHC: 35 g/dL (ref 30.0–36.0)
MCV: 98.3 fL (ref 80.0–100.0)
Monocytes Absolute: 0.4 10*3/uL (ref 0.1–1.0)
Monocytes Relative: 7 %
Neutro Abs: 3.6 10*3/uL (ref 1.7–7.7)
Neutrophils Relative %: 56 %
Platelets: 211 10*3/uL (ref 150–400)
RBC: 4.07 MIL/uL (ref 3.87–5.11)
RDW: 12 % (ref 11.5–15.5)
WBC: 6.3 10*3/uL (ref 4.0–10.5)
nRBC: 0 % (ref 0.0–0.2)

## 2021-09-20 LAB — BASIC METABOLIC PANEL
Anion gap: 10 (ref 5–15)
BUN: 6 mg/dL (ref 6–20)
CO2: 24 mmol/L (ref 22–32)
Calcium: 8.9 mg/dL (ref 8.9–10.3)
Chloride: 98 mmol/L (ref 98–111)
Creatinine, Ser: 0.61 mg/dL (ref 0.44–1.00)
GFR, Estimated: >60 mL/min (ref >60)
Glucose, Bld: 393 mg/dL — ABNORMAL HIGH (ref 70–99)
Potassium: 3.6 mmol/L (ref 3.5–5.1)
Sodium: 132 mmol/L — ABNORMAL LOW (ref 135–145)

## 2021-09-20 LAB — MAGNESIUM: Magnesium: 1.9 mg/dL (ref 1.7–2.4)

## 2021-09-20 LAB — CK: Total CK: 26 U/L — ABNORMAL LOW (ref 38–234)

## 2021-09-20 MED ORDER — ACETAMINOPHEN 325 MG PO TABS
650.0000 mg | ORAL_TABLET | Freq: Once | ORAL | Status: AC
  Administered 2021-09-20: 650 mg via ORAL
  Filled 2021-09-20: qty 2

## 2021-09-20 MED ORDER — KETOROLAC TROMETHAMINE 15 MG/ML IJ SOLN
15.0000 mg | Freq: Once | INTRAMUSCULAR | Status: AC
  Administered 2021-09-20: 15 mg via INTRAMUSCULAR
  Filled 2021-09-20: qty 1

## 2021-09-20 MED ORDER — NAPROXEN 500 MG PO TABS: 500.0000 mg | ORAL_TABLET | Freq: Two times a day (BID) | ORAL | 0 refills | Status: AC

## 2021-09-20 NOTE — ED Provider Triage Note (Signed)
  Emergency Medicine Provider Triage Evaluation Note  Claudia Mills , a 51 y.o.female,  was evaluated in triage.  Pt complains of right upper leg pain x 2 weeks.  Denies any recent injuries or illnesses.  Reports concern for blood clot.  No recent surgeries or travel.   Review of Systems  Positive: Right upper leg pain Negative: Denies fever, chest pain, vomiting  Physical Exam  There were no vitals filed for this visit. Gen:   Awake, no distress   Resp:  Normal effort  MSK:   Moves extremities without difficulty  Other:  Tenderness with palpation of the right thigh, particular along the medial aspect.  Medical Decision Making  Given the patient's initial medical screening exam, the following diagnostic evaluation has been ordered. The patient will be placed in the appropriate treatment space, once one is available, to complete the evaluation and treatment. I have discussed the plan of care with the patient and I have advised the patient that an ED physician or mid-level practitioner will reevaluate their condition after the test results have been received, as the results may give them additional insight into the type of treatment they may need.    Diagnostics: X-ray, ultrasound  Treatments: none immediately   Varney Daily, [place] 09/20/21 1653

## 2021-09-20 NOTE — ED Notes (Signed)
EMT tried x2 for labs but failed

## 2021-09-20 NOTE — Discharge Instructions (Signed)
Please return to the emergency department for any new, worsening, or changing symptoms or other concerns including weakness in your legs, urinary or stool incontinence or retention, numbness or tingling in your extremities/buttocks/groin, fevers, or any other concerns or change in symptoms.  

## 2021-09-20 NOTE — ED Triage Notes (Signed)
Pt to ED via POV from home. Pt reports medial right thigh pain that started 2 weeks ago. Pt denies injury to area. Pt denies hx DVT or blood thinner use.   Pt with Hx DM and MI

## 2021-09-20 NOTE — ED Provider Notes (Signed)
[facility] Provider Note    Event Date/Time   First MD Initiated Contact with Patient 09/20/21 1822     (approximate)   History   Leg Pain (Right medial thigh )   HPI  Claudia Mills is a 51 y.o. female with a PMH of HLD, HTN, DM, who presents today for evaluation of right leg pain.  Patient was seen in outside emergency department on 04/16/21 for evaluation of back pain and found to have a closed compression fracture of the L2 lumbar vertebrae.  Patient reports that her back is feeling better, however she has mild dull achiness in her thigh.  She reports that the pain is mostly medial and goes to about 2 inches below her knee.  She has not noticed any skin color changes, redness, or warmth there.  She denies any weakness in her leg.  She denies any saddle anesthesia or urinary/fecal incontinence or retention.  No infectious type symptoms.  She is still able to ambulate.  She has not noticed any swelling.  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  There are no problems to display for this patient.         Physical Exam   Triage Vital Signs: ED Triage Vitals  Enc Vitals Group     BP 09/20/21 1744 (!) 156/85     Pulse Rate 09/20/21 1744 91     Resp 09/20/21 1744 18     Temp 09/20/21 1744 98.9 F (37.2 C)     Temp Source 09/20/21 1744 Oral     SpO2 09/20/21 1744 96 %     Weight 09/20/21 1742 125 lb (56.7 kg)     Height 09/20/21 1742 5\' 6"  (1.676 m)     Head Circumference --      Peak Flow --      Pain Score 09/20/21 1742 5     Pain Loc --      Pain Edu? --      Excl. in [place]? --     Most recent vital signs: Vitals:   09/20/21 1744 09/20/21 2113  BP: (!) 156/85 (!) 154/79  Pulse: 91 84  Resp: 18 16  Temp: 98.9 F (37.2 C) 98.6 F (37 C)  SpO2: 96% 98%    Physical Exam Vitals and nursing note reviewed.  Constitutional:      General: Awake and alert. No  acute distress.    Appearance: Normal appearance. The patient is normal weight.  HENT:     Head: Normocephalic and atraumatic.     Mouth: Mucous membranes are moist.  Eyes:     General: PERRL. Normal EOMs        Right eye: No discharge.        Left eye: No discharge.     Conjunctiva/sclera: Conjunctivae normal.  Cardiovascular:     Rate and Rhythm: Normal rate and regular rhythm.     Pulses: Normal pulses.     Heart sounds: Normal heart sounds Pulmonary:     Effort: Pulmonary effort is normal. No respiratory distress.     Breath sounds: Normal breath sounds.  Abdominal:     Abdomen is soft. There is no abdominal tenderness. No rebound or guarding. No distention. Back: No midline tenderness. Strength and sensation 5/5 to bilateral lower extremities. Normal great toe extension against resistance. Normal sensation throughout feet. Normal patellar reflexes. Negative SLR and  opposite SLR bilaterally. Negative FABER test Normal-appearing legs bilaterally.  No pitting edema.  No skin color changes.  No erythema, warmth, crepitus.  Full and normal range of motion of the level of the hip, knee, ankle with active and passive range of motion.  Negative logroll of the hip.  Normal distal pulses, equal bilaterally.  Compartments are soft and compressible throughout. Musculoskeletal:        General: No swelling. Normal range of motion.     Cervical back: Normal range of motion and neck supple.  Skin:    General: Skin is warm and dry.     Capillary Refill: Capillary refill takes less than 2 seconds.     Findings: No rash.  Neurological:     Mental Status: The patient is awake and alert.      ED Results / Procedures / Treatments   Labs (all labs ordered are listed, but only abnormal results are displayed) Labs Reviewed  CBC WITH DIFFERENTIAL/PLATELET - Abnormal; Notable for the following components:      Result Value   MCH 34.4 (*)    All other components within normal limits  BASIC  METABOLIC PANEL - Abnormal; Notable for the following components:   Sodium 132 (*)    Glucose, Bld 393 (*)    All other components within normal limits  CK - Abnormal; Notable for the following components:   Total CK 26 (*)    All other components within normal limits  MAGNESIUM     EKG     RADIOLOGY     PROCEDURES:  Critical Care performed:   Procedures   MEDICATIONS ORDERED IN ED: Medications  ketorolac (TORADOL) 15 MG/ML injection 15 mg (15 mg Intramuscular Given 09/20/21 2052)  acetaminophen (TYLENOL) tablet 650 mg (650 mg Oral Given 09/20/21 2049)     IMPRESSION / MDM / ASSESSMENT AND PLAN / ED COURSE  I reviewed the triage vital signs and the nursing notes.   Differential diagnosis includes, but is not limited to, DVT, lumbar radiculopathy, electrolyte abnormality, lumbar radiculopathy, osseous injury.  Patient is awake and alert, hemodynamically stable and afebrile.  DVT study ordered at triage is negative for clot.  X-ray demonstrates no acute osseous abnormality.  Patient has normal distal pulses, normal sensation and strength throughout her leg.  No infectious type symptoms to suggest a psoas abscess.  No abdominal tenderness on exam.  She is full active and passive range of motion of hips, knees, ankles.  Do not suspect vascular occlusion given her normal pedal pulses, and foot are warm and well-perfused bilaterally.  Do not suspect compression at the level of the abdomen given the lack of edema and lack of abdominal tenderness.  There are no skin color changes to suggest infection.  No infectious symptoms to suggest infection.  Distribution of her pain is consistent with the distribution of her L2-L3 nerve root, which is at the level that she had injured before.  She may have residual lumbar radiculopathy with no neuro compromise.  Her blood tests are overall reassuring without leukocytosis, normal CPK.  No electrolyte disarray.  We will treat for lumbar radiculopathy.   We discussed return precautions and the importance of close outpatient follow-up.  Patient understands and agrees with plan.  Discharged in stable condition.   Patient's presentation is most consistent with acute complicated illness / injury requiring diagnostic workup.      FINAL CLINICAL IMPRESSION(S) / ED DIAGNOSES   Final diagnoses:  Right leg pain  Lumbar  radiculopathy     Rx / DC Orders   ED Discharge Orders          Ordered    naproxen (NAPROSYN) 500 MG tablet  2 times daily with meals        09/20/21 2102             Note:  This document was prepared using Dragon voice recognition software and may include unintentional dictation errors.   Emeline Gins 09/20/21 2205    Delman Kitten, MD 09/21/21 Laureen Abrahams

## 2022-01-17 DIAGNOSIS — E1140 Type 2 diabetes mellitus with diabetic neuropathy, unspecified: Secondary | ICD-10-CM | POA: Diagnosis not present

## 2022-01-17 DIAGNOSIS — Z0131 Encounter for examination of blood pressure with abnormal findings: Secondary | ICD-10-CM | POA: Diagnosis not present

## 2022-01-17 DIAGNOSIS — I10 Essential (primary) hypertension: Secondary | ICD-10-CM | POA: Diagnosis not present

## 2022-01-17 DIAGNOSIS — M545 Low back pain, unspecified: Secondary | ICD-10-CM | POA: Diagnosis not present

## 2022-01-17 DIAGNOSIS — M79604 Pain in right leg: Secondary | ICD-10-CM | POA: Diagnosis not present

## 2022-01-17 DIAGNOSIS — Z712 Person consulting for explanation of examination or test findings: Secondary | ICD-10-CM | POA: Diagnosis not present

## 2022-01-17 DIAGNOSIS — I2510 Atherosclerotic heart disease of native coronary artery without angina pectoris: Secondary | ICD-10-CM | POA: Diagnosis not present

## 2022-01-17 DIAGNOSIS — Z1389 Encounter for screening for other disorder: Secondary | ICD-10-CM | POA: Diagnosis not present

## 2022-01-17 DIAGNOSIS — R7309 Other abnormal glucose: Secondary | ICD-10-CM | POA: Diagnosis not present

## 2022-01-17 DIAGNOSIS — Z0130 Encounter for examination of blood pressure without abnormal findings: Secondary | ICD-10-CM | POA: Diagnosis not present

## 2022-01-21 DIAGNOSIS — M79604 Pain in right leg: Secondary | ICD-10-CM | POA: Diagnosis not present

## 2022-01-21 DIAGNOSIS — M5137 Other intervertebral disc degeneration, lumbosacral region: Secondary | ICD-10-CM | POA: Diagnosis not present

## 2022-01-21 DIAGNOSIS — I10 Essential (primary) hypertension: Secondary | ICD-10-CM | POA: Diagnosis not present

## 2022-01-21 DIAGNOSIS — M545 Low back pain, unspecified: Secondary | ICD-10-CM | POA: Diagnosis not present

## 2022-01-21 DIAGNOSIS — I252 Old myocardial infarction: Secondary | ICD-10-CM | POA: Diagnosis not present

## 2022-01-21 DIAGNOSIS — Z7984 Long term (current) use of oral hypoglycemic drugs: Secondary | ICD-10-CM | POA: Diagnosis not present

## 2022-01-21 DIAGNOSIS — Z7902 Long term (current) use of antithrombotics/antiplatelets: Secondary | ICD-10-CM | POA: Diagnosis not present

## 2022-01-21 DIAGNOSIS — R634 Abnormal weight loss: Secondary | ICD-10-CM | POA: Diagnosis not present

## 2022-01-21 DIAGNOSIS — M533 Sacrococcygeal disorders, not elsewhere classified: Secondary | ICD-10-CM | POA: Diagnosis not present

## 2022-01-21 DIAGNOSIS — E1140 Type 2 diabetes mellitus with diabetic neuropathy, unspecified: Secondary | ICD-10-CM | POA: Diagnosis not present

## 2022-01-21 DIAGNOSIS — M79605 Pain in left leg: Secondary | ICD-10-CM | POA: Diagnosis not present

## 2022-01-21 DIAGNOSIS — Z6820 Body mass index (BMI) 20.0-20.9, adult: Secondary | ICD-10-CM | POA: Diagnosis not present

## 2022-01-21 DIAGNOSIS — M160 Bilateral primary osteoarthritis of hip: Secondary | ICD-10-CM | POA: Diagnosis not present

## 2022-01-21 DIAGNOSIS — R252 Cramp and spasm: Secondary | ICD-10-CM | POA: Diagnosis not present

## 2022-01-21 DIAGNOSIS — Z7982 Long term (current) use of aspirin: Secondary | ICD-10-CM | POA: Diagnosis not present

## 2022-01-21 DIAGNOSIS — R61 Generalized hyperhidrosis: Secondary | ICD-10-CM | POA: Diagnosis not present

## 2022-01-21 DIAGNOSIS — Z79899 Other long term (current) drug therapy: Secondary | ICD-10-CM | POA: Diagnosis not present

## 2022-01-21 DIAGNOSIS — M1909 Primary osteoarthritis, other specified site: Secondary | ICD-10-CM | POA: Diagnosis not present

## 2022-01-25 DIAGNOSIS — M5126 Other intervertebral disc displacement, lumbar region: Secondary | ICD-10-CM | POA: Diagnosis not present

## 2022-01-25 DIAGNOSIS — M47816 Spondylosis without myelopathy or radiculopathy, lumbar region: Secondary | ICD-10-CM | POA: Diagnosis not present

## 2022-01-25 DIAGNOSIS — M5127 Other intervertebral disc displacement, lumbosacral region: Secondary | ICD-10-CM | POA: Diagnosis not present

## 2022-01-27 DIAGNOSIS — E119 Type 2 diabetes mellitus without complications: Secondary | ICD-10-CM | POA: Diagnosis not present

## 2022-01-27 DIAGNOSIS — Z7982 Long term (current) use of aspirin: Secondary | ICD-10-CM | POA: Diagnosis not present

## 2022-01-27 DIAGNOSIS — Z87891 Personal history of nicotine dependence: Secondary | ICD-10-CM | POA: Diagnosis not present

## 2022-01-27 DIAGNOSIS — I10 Essential (primary) hypertension: Secondary | ICD-10-CM | POA: Diagnosis not present

## 2022-01-27 DIAGNOSIS — S8991XA Unspecified injury of right lower leg, initial encounter: Secondary | ICD-10-CM | POA: Diagnosis not present

## 2022-01-27 DIAGNOSIS — S80211A Abrasion, right knee, initial encounter: Secondary | ICD-10-CM | POA: Diagnosis not present

## 2022-01-27 DIAGNOSIS — E785 Hyperlipidemia, unspecified: Secondary | ICD-10-CM | POA: Diagnosis not present

## 2022-01-27 DIAGNOSIS — S8001XA Contusion of right knee, initial encounter: Secondary | ICD-10-CM | POA: Diagnosis not present

## 2022-01-27 DIAGNOSIS — Z79899 Other long term (current) drug therapy: Secondary | ICD-10-CM | POA: Diagnosis not present

## 2022-01-27 DIAGNOSIS — M25561 Pain in right knee: Secondary | ICD-10-CM | POA: Diagnosis not present

## 2022-02-09 DIAGNOSIS — E1140 Type 2 diabetes mellitus with diabetic neuropathy, unspecified: Secondary | ICD-10-CM | POA: Diagnosis not present

## 2022-02-09 DIAGNOSIS — Z712 Person consulting for explanation of examination or test findings: Secondary | ICD-10-CM | POA: Diagnosis not present

## 2022-02-09 DIAGNOSIS — L989 Disorder of the skin and subcutaneous tissue, unspecified: Secondary | ICD-10-CM | POA: Insufficient documentation

## 2022-02-09 DIAGNOSIS — M25561 Pain in right knee: Secondary | ICD-10-CM | POA: Insufficient documentation

## 2022-02-09 DIAGNOSIS — M79604 Pain in right leg: Secondary | ICD-10-CM | POA: Diagnosis not present

## 2022-02-09 DIAGNOSIS — Z0130 Encounter for examination of blood pressure without abnormal findings: Secondary | ICD-10-CM | POA: Diagnosis not present

## 2022-02-09 DIAGNOSIS — Z1331 Encounter for screening for depression: Secondary | ICD-10-CM | POA: Diagnosis not present

## 2022-02-09 DIAGNOSIS — Z1389 Encounter for screening for other disorder: Secondary | ICD-10-CM | POA: Diagnosis not present

## 2022-02-22 DIAGNOSIS — C44219 Basal cell carcinoma of skin of left ear and external auricular canal: Secondary | ICD-10-CM | POA: Diagnosis not present

## 2022-02-22 DIAGNOSIS — D485 Neoplasm of uncertain behavior of skin: Secondary | ICD-10-CM | POA: Diagnosis not present

## 2022-02-22 DIAGNOSIS — D2371 Other benign neoplasm of skin of right lower limb, including hip: Secondary | ICD-10-CM | POA: Diagnosis not present

## 2022-02-22 DIAGNOSIS — C4441 Basal cell carcinoma of skin of scalp and neck: Secondary | ICD-10-CM | POA: Diagnosis not present

## 2022-02-28 ENCOUNTER — Ambulatory Visit: Payer: Medicaid Other | Attending: Family Medicine | Admitting: Physical Therapy

## 2022-02-28 ENCOUNTER — Encounter: Payer: Self-pay | Admitting: Physical Therapy

## 2022-02-28 DIAGNOSIS — M5459 Other low back pain: Secondary | ICD-10-CM | POA: Insufficient documentation

## 2022-02-28 DIAGNOSIS — M6281 Muscle weakness (generalized): Secondary | ICD-10-CM | POA: Diagnosis not present

## 2022-02-28 DIAGNOSIS — M2560 Stiffness of unspecified joint, not elsewhere classified: Secondary | ICD-10-CM | POA: Diagnosis not present

## 2022-02-28 NOTE — Therapy (Deleted)
OUTPATIENT PHYSICAL THERAPY THORACOLUMBAR EVALUATION   Patient Name: Claudia Mills MRN: [id number] DOB:08/24/1970, 52 y.o., female Today's Date: 02/28/2022  END OF SESSION:   Past Medical History:  Diagnosis Date   Diabetes mellitus without complication ([place])    MI (myocardial infarction) ([place])    Past Surgical History:  Procedure Laterality Date   CORONARY ANGIOPLASTY WITH STENT PLACEMENT     There are no problems to display for this patient.   PCP: ***  REFERRING PROVIDER: ***  REFERRING DIAG: ***  RATIONALE FOR EVALUATION AND TREATMENT: {HABREHAB:27488}  THERAPY DIAG: No diagnosis found.  ONSET DATE: ***  FOLLOW-UP APPT SCHEDULED WITH REFERRING PROVIDER: {yes/no:20286}    SUBJECTIVE:                                                                                                                                                                                         SUBJECTIVE STATEMENT:  ***  PERTINENT HISTORY: ***  PAIN:    Pain Intensity: Present: /10, Best: /10, Worst: /10 Pain location: *** Pain Quality: {PAIN DESCRIPTION:21022940}  Radiating: {yes/no:20286}  Numbness/Tingling: {yes/no:20286} Focal Weakness: {yes/no:20286} Aggravating factors: *** Relieving factors: *** 24-hour pain behavior: *** How long can you sit: How long can you stand: History of prior back injury, pain, surgery, or therapy: {yes/no:20286} Dominant hand: {RIGHT/LEFT:20294} Imaging: {yes/no:20286}  Red flags: Negative for bowel/bladder changes, saddle paresthesia, personal history of cancer, h/o spinal tumors, h/o compression fx, h/o abdominal aneurysm, abdominal pain, chills/fever, night sweats, nausea, vomiting, unrelenting pain, first onset of insidious LBP <20 y/o  PRECAUTIONS: {Therapy precautions:24002}  WEIGHT BEARING RESTRICTIONS: {Yes ***/No:24003}  FALLS: Has patient fallen in last 6 months? {fallsyesno:27318}  Living Environment Lives with: {OPRC lives  with:25569::"lives with their family"} Lives in: {Lives in:25570} Stairs: {opstairs:27293} Has following equipment at home: {Assistive devices:23999}  Prior level of function: {PLOF:24004}  Occupational demands:   Hobbies:   Patient Goals: ***   OBJECTIVE:  Patient Surveys  {rehab surveys:24030}  Cognition Patient is oriented to person, place, and time.  Recent memory is intact.  Remote memory is intact.  Attention span and concentration are intact.  Expressive speech is intact.  Patient's fund of knowledge is within normal limits for educational level.    Gross Musculoskeletal Assessment Tremor: None Bulk: Normal Tone: Normal No visible step-off along spinal column, no signs of scoliosis  GAIT: Distance walked: *** Assistive device utilized: {Assistive devices:23999} Level of assistance: {Levels of assistance:24026} Comments: ***  Posture: Lumbar lordosis: WNL Iliac crest height: Equal bilaterally Lumbar lateral shift: Negative  AROM AROM (Normal range in degrees) AROM   Lumbar   Flexion (65)   Extension (30)  Right lateral flexion (25)   Left lateral flexion (25)   Right rotation (30)   Left rotation (30)       Hip Right Left  Flexion (125)    Extension (15)    Abduction (40)    Adduction     Internal Rotation (45)    External Rotation (45)        Knee    Flexion (135)    Extension (0)        Ankle    Dorsiflexion (20)    Plantarflexion (50)    Inversion (35)    Eversion (15)    (* = pain; Blank rows = not tested)  LE MMT: MMT (out of 5) Right  Left   Hip flexion    Hip extension    Hip abduction    Hip adduction    Hip internal rotation    Hip external rotation    Knee flexion    Knee extension    Ankle dorsiflexion    Ankle plantarflexion    Ankle inversion    Ankle eversion    (* = pain; Blank rows = not tested)  Sensation Grossly intact to light touch throughout bilateral LEs as determined by testing dermatomes L2-S2.  Proprioception, stereognosis, and hot/cold testing deferred on this date.  Reflexes R/L Knee Jerk (L3/4): 2+/2+  Ankle Jerk (S1/2): 2+/2+   Muscle Length Hamstrings: R: {NEGATIVE/POSITIVE YE:1977733 L: {NEGATIVE/POSITIVE YE:1977733 E[place] (quadriceps): R: {NEGATIVE/POSITIVE YE:1977733 L: {NEGATIVE/POSITIVE YE:1977733 Thomas (hip flexors): R: {NEGATIVE/POSITIVE YE:1977733 L: {NEGATIVE/POSITIVE [id number] Ober: R: {NEGATIVE/POSITIVE YE:1977733 L: {NEGATIVE/POSITIVE YE:1977733  Palpation Location Right Left         Lumbar paraspinals    Quadratus Lumborum    Gluteus Maximus    Gluteus Medius    Deep hip external rotators    PSIS    Fortin's Area (SIJ)    Greater Trochanter    (Blank rows = not tested) Graded on 0-4 scale (0 = no pain, 1 = pain, 2 = pain with wincing/grimacing/flinching, 3 = pain with withdrawal, 4 = unwilling to allow palpation)  Passive Accessory Intervertebral Motion Pt denies reproduction of back pain with CPA L1-L5 and UPA bilaterally L1-L5. Generally, hypomobile throughout  Special Tests Lumbar Radiculopathy and Discogenic: Centralization and Peripheralization (SN 92, -LR 0.12): {NEGATIVE/POSITIVE FOR:19998} Slump (SN 83, -LR 0.32): R: {NEGATIVE/POSITIVE FOR:19998} L: {NEGATIVE/POSITIVE FOR:19998} SLR (SN 92, -LR 0.29): R: {NEGATIVE/POSITIVE YE:1977733 L:  {NEGATIVE/POSITIVE YE:1977733 Crossed SLR (SP 90): R: {NEGATIVE/POSITIVE YE:1977733 L: {NEGATIVE/POSITIVE YE:1977733  Facet Joint: Extension-Rotation (SN 100, -LR 0.0): R: {NEGATIVE/POSITIVE YE:1977733 L: {NEGATIVE/POSITIVE YE:1977733  Lumbar Foraminal Stenosis: Lumbar quadrant (SN 70): R: {NEGATIVE/POSITIVE YE:1977733 L: {NEGATIVE/POSITIVE YE:1977733  Hip: FABER (SN 81): R: {NEGATIVE/POSITIVE YE:1977733 L: {NEGATIVE/POSITIVE YE:1977733 FADIR (SN 94): R: {NEGATIVE/POSITIVE FOR:19998} L: {NEGATIVE/POSITIVE YE:1977733 Hip scour (SN 50): R: {NEGATIVE/POSITIVE YE:1977733 L: {NEGATIVE/POSITIVE  YE:1977733  SIJ:  Thigh Thrust (SN 88, -LR 0.18) : R: {NEGATIVE/POSITIVE YE:1977733 L: {NEGATIVE/POSITIVE YE:1977733  Piriformis Syndrome: FAIR Test (SN 88, SP 83): R: {NEGATIVE/POSITIVE YE:1977733 L: {NEGATIVE/POSITIVE YE:1977733  Functional Tasks Lifting: Deep squat: Sit to stand: Forward Step-Down Test: R:  L:  Lateral Step-Down Test: R:  L:   Beighton scale  LEFT  RIGHT           1. Passive dorsiflexion and hyperextension of the fifth MCP joint beyond 90  0 0   2. Passive apposition of the thumb to the flexor aspect of the forearm  0  0   3. Passive hyperextension of the elbow  beyond 10  0  0   4. Passive hyperextension of the knee beyond 10  0  0   5. Active forward flexion of the trunk with the knees fully extended so that the palms of the hands rest flat on the floor   0   TOTAL         0/ 9    Clinical Prediction Rule for Compression Fractures: 1. >52 y/o 2. no presence of leg pain 3. BMI <22 4. client not regularly exercising 5. female <1 of 5 = SN 97, - LR 0.16 >4 of 5 = SP 96, + LR 9.6  Clinical Prediction Rule for Spinal Stenosis: 1. Bilateral symptoms 2. Leg pain > back pain 3. Pain during walking or standing 4. Pain relief when sitting 5. Age >86 y/o <1 (+) finding = rule out stenosis (SN 96) 4 of 5 (+) findings = rule in stenosis (SP 98)  Clinical Prediction Rule for Facet Joint Syndrome (check pg 460): 1. Age > 19 y/o 2. Symptoms best when walking 3. Symptoms best when sitting 4. Onset of pain is paraspinal 5. (+) lumbar extension-rotation test >3 (+) findings = SP 91, +LR 9.7 <2 (+) findings = SN 100  Pain Provocation Cluster for SIJ Dysfunction Thigh Thrust Test (SN 88, -LR 0.18) Gaenslen's Test Distraction Test Compression Test Sacral Thrust Test  3 (+) tests: if discogenic pain has been ruled out through repeated extensions; SN 94, -LR 0.80  Rule out Hip OA (SN 86) Hip pain Hip IR <15 degrees morning stiffness < 60 min > 71  y/o  Neurogenic vs Vascular Claudication (Rieman, 2016)  Description Neurogenic Vascular  Quality of pain cramping Burning, cramping  LBP Frequently present Absent   Sensory symptoms Frequently present Absent  Muscle weakness Frequently present Absent  Reflex changes Frequently present Absent  Bicycle test Symptoms only when sitting upright Symptoms not position dependent  Arterial pulses Normal  Decreased or absent  Skin/dystrophic changes Absent  Frequently present  Aggravating factors Upright posture, extension of trunk Any leg activity  Relieving factors Sitting, bending forward Rest, no leg activity  Walking uphill Symptoms produced later Not position dependent  Walking downhill Symptoms produced earlier Not position dependent   Lumbar Spinal Stenosis vs Radiculopathy due to Disc Herniation (Rieman, 2016)  Description Spinal Stenosis Disc Herniation  Age Usually > 64 y/o Usually < 32 y/o  Onset Usually more insidious Usually more sudden  Position change: flexion Better  Worse  Position change: extension Worse Better  Focal muscle weakness Less common Can be common  Dural tension Less common Common  UMN or LMN involvement  Central stenosis: UMN Lateral foraminal stenosis: LMN LMN     TODAY'S TREATMENT: DATE: ***     PATIENT EDUCATION:  Education details: *** Person educated: {Person educated:25204} Education method: {Education Method:25205} Education comprehension: {Education Comprehension:25206}   HOME EXERCISE PROGRAM:     ASSESSMENT:  CLINICAL IMPRESSION: Patient is a *** y.o. *** who was seen today for physical therapy evaluation and treatment for ***.   OBJECTIVE IMPAIRMENTS: {opptimpairments:25111}.   ACTIVITY LIMITATIONS: {activitylimitations:27494}  PARTICIPATION LIMITATIONS: {participationrestrictions:25113}  PERSONAL FACTORS: {Personal factors:25162} are also affecting patient's functional outcome.   REHAB POTENTIAL:  {rehabpotential:25112}  CLINICAL DECISION MAKING: {clinical decision making:25114}  EVALUATION COMPLEXITY: {Evaluation complexity:25115}   GOALS: Goals reviewed with patient? {yes/no:20286}  SHORT TERM GOALS: Target date: {follow up:25551}  Pt will be independent with HEP in order to improve strength and decrease back pain to improve pain-free function  at home and work. Baseline: *** Goal status: INITIAL   LONG TERM GOALS: Target date: {follow up:25551}  Pt will increase FOTO to at least *** to demonstrate significant improvement in function at home and work related to back pain  Baseline:  Goal status: INITIAL  2.  Pt will decrease worst back pain by at least 2 points on the NPRS in order to demonstrate clinically significant reduction in back pain. Baseline: *** Goal status: INITIAL  3.  Pt will decrease mODI score by at least 13 points in order demonstrate clinically significant reduction in back pain/disability.       Baseline: *** Goal status: INITIAL  4.  *** Baseline: *** Goal status: INITIAL   PLAN: PT FREQUENCY: 1-2x/week  PT DURATION: {rehab duration:25117}  PLANNED INTERVENTIONS: Therapeutic exercises, Therapeutic activity, Neuromuscular re-education, Balance training, Gait training, Patient/Family education, Self Care, Joint mobilization, Joint manipulation, Vestibular training, Canalith repositioning, Orthotic/Fit training, DME instructions, Dry Needling, Electrical stimulation, Spinal manipulation, Spinal mobilization, Cryotherapy, Moist heat, Taping, Traction, Ultrasound, Ionotophoresis 4mg /ml Dexamethasone, Manual therapy, and Re-evaluation.  PLAN FOR NEXT SESSION: ***    Eilleen Kempf, PT 02/28/2022, 8:39 AM

## 2022-02-28 NOTE — Addendum Note (Signed)
Addended by: Eilleen Kempf on: 02/28/2022 05:36 PM   Modules accepted: Orders

## 2022-02-28 NOTE — Therapy (Addendum)
OUTPATIENT PHYSICAL THERAPY THORACOLUMBAR EVALUATION   Patient Name: Claudia Mills MRN: [id number] DOB:02/05/1971, 52 y.o., female Today's Date: 02/28/2022  END OF SESSION:  PT End of Session - 02/28/22 1631     Visit Number 1    Number of Visits 17    Date for PT Re-Evaluation 04/25/22    Authorization Type [place] Completed Medicaid, Auth req'd;   initial eval 02/28/22    Progress Note Due on Visit 10    PT Start Time 0848    PT Stop Time 0940    PT Time Calculation (min) 52 min    Activity Tolerance Patient limited by pain    Behavior During Therapy WFL for tasks assessed/performed             Past Medical History:  Diagnosis Date   Diabetes mellitus without complication (HCC)    MI (myocardial infarction) (HCC)    Past Surgical History:  Procedure Laterality Date   CORONARY ANGIOPLASTY WITH STENT PLACEMENT     There are no problems to display for this patient.   PCP: General, General Practice   REFERRING PROVIDER: Emogene Morgan, MD   REFERRING DIAG:  M25.561 (ICD-10-CM) - Pain in right knee  M79.604 (ICD-10-CM) - Pain in right leg  M54.50 (ICD-10-CM) - Low back pain, unspecified    RATIONALE FOR EVALUATION AND TREATMENT: Rehabilitation  THERAPY DIAG: Other low back pain  Muscle weakness (generalized)  Joint stiffness of spine  ONSET DATE: 01/27/22  FOLLOW-UP APPT SCHEDULED WITH REFERRING PROVIDER: No    SUBJECTIVE:                                                                                                                                                                                         SUBJECTIVE STATEMENT:  52 year old female with primary complaint of R-sided low back pain and R knee/leg pain with Hx of recent trauma 01/27/22; hx of compression fracture dating back to 04/07/21  PERTINENT HISTORY: Patient reports right LE giving out while walking longer distances in [place] and prefers to use shopping cart as a way to keep balance.  Patient states that using the steps is difficult. Patient states that they prefer to use the a shopping cart for balance. Has some numbness and tingling in the lower leg, long distances make it worse and a hot rag and CBD lotion make it feel better. 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 Goals: decrease pain and get stronger. And stepping up the steps. Wakes up in the middle in the night with aching and is taking Gabapentin and states  that helps for a short time and then will wake up in the middle of the night. Standing work with folding socks and struggles walking to the bathroom. Lost a good amount of weight recently and believes it to be due to diabetes, switch in medications, and poor nutrition.  PAIN:    Pain Intensity: Present: 0/10, Best: 0/10, Worst: 10/10 Pain location: low back with referring to anterior right thigh Pain Quality: intermittent, burning, and aching  Radiating: Yes  Numbness/Tingling: Yes, R anterior thigh Focal Weakness: Yes right LE weakness with intermittent buckling  Aggravating factors: walking long distances in [employer] and trying to climb the stairs Relieving factors: Rest and heat 24-hour pain behavior: increases with longer activities. Will wake up from sleep with  History of prior back injury, pain, surgery, or therapy: Yes  Imaging: Yes ,  Negative DVT screen  Radiograph of R femur (09/20/21): Negative  L-spine CT Scan 04/07/21 IMPRESSION: 1. L2 compression fracture with 25% height loss, likely recent given mild paravertebral soft tissue edema/hematoma. 2. Multilevel disc degeneration, most severe at L4-5 where there is severe spinal stenosis and severe right neural foraminal stenosis. 3. Moderate spinal stenosis at L3-4. 4. Asymmetric left lateral recess stenosis and moderate bilateral neural foraminal stenosis at L5-S1.   Red flags: Negative for bowel/bladder changes, saddle paresthesia, personal  history of cancer, h/o spinal tumors, h/o compression fx, h/o abdominal aneurysm, abdominal pain, chills/fever, night sweats, nausea, vomiting, unrelenting pain, first onset of insidious LBP <20 y/o  -history of nocturnal pain and weight loss (explained with poor nutrition)   PRECAUTIONS: None  WEIGHT BEARING RESTRICTIONS: No  FALLS: Has patient fallen in last 6 months? Yes. 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    OBJECTIVE:  Patient Surveys  FOTO currently 33 with a prediction of 41  Cognition Patient is oriented to person, place, and time.  Recent memory is intact.  Remote memory is intact.  Attention span and concentration are intact.  Expressive speech is intact.  Patient's fund of knowledge is within normal limits for educational level.    Gross Musculoskeletal Assessment Tremor: None Bulk: Normal Tone: Normal No visible step-off along spinal column, no signs of scoliosis   GAIT: Comments: Antalgic gait on right LE, decreased terminal knee extension at terminal swing RLE, decreased RLE weight shift; forward flexed posture maintained throughout gait cycle   Posture: Lumbar lordosis: Decreased Increased thoracic kyphosis Forward head, rounded shoulders  AROM AROM (Normal range in degrees) AROM   Lumbar   Flexion (65)  Mod motion loss *  Extension (30) Mod motion loss *  Right lateral flexion (25) Min motion loss *  Left lateral flexion (25) Min motion loss  Right rotation (30) WNL  Left  rotation (30) WNL              Hip Right Left  Flexion (125) 90* 100  Extension (15)    Abduction (40)    Adduction     Internal Rotation (45)    External Rotation (45)  Knee    Flexion (135) 85 120  Extension (0) +3 +4      Ankle    Dorsiflexion (20)    Plantarflexion (50)    Inversion (35)    Eversion (15)    (* = pain; Blank rows = not tested)   PROM Hip flexion: R 60, L 105 Knee flexion: R 85, L 145   LE MMT: MMT (out of 5) Right  Left   Hip flexion 3 3  Hip extension    Hip abduction 4 4  Hip adduction 4 4  Hip internal rotation    Hip external rotation    Knee flexion 4 4  Knee extension 3 3  Ankle dorsiflexion 4 5  Ankle plantarflexion    Ankle inversion 5 5  Ankle eversion    (* = pain; Blank rows = not tested)  Sensation Deferred  Reflexes Deferred  Muscle Length Hamstrings: R: Not examined L: Not examined Ely (quadriceps): R: Positive for back pain L: Not examined Thomas (hip flexors): R: Not examined L: Not examined Ober: R: Not examined L: Not examined  Palpation Location Right Left         Lumbar paraspinals    Quadratus Lumborum    Gluteus Maximus 1 0  Gluteus Medius 1 0  Deep hip external rotators 2 0  PSIS    Fortin's Area (SIJ)    Adductors 2   Hamstrings 1   Quadriceps  2   Gastrocnemius/soleus 2   (Blank rows = not tested) Graded on 0-4 scale (0 = no pain, 1 = pain, 2 = pain with wincing/grimacing/flinching, 3 = pain with withdrawal, 4 = unwilling to allow palpation)  Passive Accessory Intervertebral Motion Spasm end-feel with L3-5 CPA. Empty end-feel L1-2, pain prior to restriction.    Special Tests Lumbar Radiculopathy and Discogenic: Centralization and Peripheralization (SN 92, -LR 0.12): Negative Slump (SN 83, -LR 0.32): R: Positive L: Negative SLR (SN 92, -LR 0.29): R: Positive L:  Negative   Facet Joint: Extension-Rotation (SN 100, -LR 0.0): R: Not examined L: Not examined  Lumbar Foraminal  Stenosis: Lumbar quadrant (SN 70): R: Not examined L: Not examined  Hip: FABER (SN 81): R: Positive L: Not examined FADIR (SN 94): R: Negative L: Not examined Hip scour (SN 50): R: Positive for back pain L: Not examined  SIJ:  Thigh Thrust (SN 88, -LR 0.18) : R: Not examined L: Not examined  Piriformis Syndrome: FAIR Test (SN 88, SP 83): R: Not examined L: Not examined     TODAY'S TREATMENT: DATE: 02/28/2022     Therapeutic Exercise - for HEP establishment, discussion on appropriate exercise/activity modification, PT education   Reviewed baseline home exercises and provided handout for [facility] program (see Access Code); tactile cueing and therapist demonstration utilized as needed for carryover of proper technique to HEP.    Patient education on current condition, anatomy involved, prognosis, plan of care.     PATIENT EDUCATION:  Education details: Pt educated on HEP and activity modification with right LE Person educated: Patient Education method: Explanation, Demonstration, and Handouts Education comprehension: verbalized understanding   HOME EXERCISE PROGRAM:   Access Code: [email or web address] URL: [email or web address] Date: 02/28/2022 Prepared by: Valentina Gu  Exercises - Prone on Elbows Stretch  - 3-4 x daily - 7 x weekly - 3-40min hold - Prone Femoral Nerve Mobilization  - 2 x daily - 7 x weekly - 2 sets - 10 reps - 1sec hold - Supine Heel Slide with Strap  -  2 x daily - 7 x weekly - 2 sets - 10 reps - 3sec hold  ASSESSMENT:  CLINICAL IMPRESSION: Patient is a 52 y.o. female who was seen today for physical therapy evaluation and treatment for low back and right LE pain. Patient has history of treatment for atraumatic low back pain in 2023. Pt sustained fall in December 2023 with acute flare-up of R lower quarter symptoms and R leg pain. Patient had contusion of R knee following trauma with DVT and fracture rule out. Patient currently presents with  decreased LE strength, decreased ROM of R hip and knee, decreased dural/neural mobility for anterior/posterior LE chain, anterior thigh hypersensitivity and taut/tender R gluteal musculature and R>L lumbar paraspinals, activity limitations in prolonged walking and stair negotiation. Patient will benefit from skilled PT to address the noted deficits as needed for best return to PLOF.  OBJECTIVE IMPAIRMENTS: decreased activity tolerance, decreased ROM, and decreased strength.   ACTIVITY LIMITATIONS:  walking far distances and using the steps.  PARTICIPATION LIMITATIONS: shopping and occupation  PERSONAL FACTORS: Fitness, Past/current experiences, Social background, and 1 comorbidity: diabetes  are also affecting patient's functional outcome.   REHAB POTENTIAL: Good  CLINICAL DECISION MAKING: Evolving/moderate complexity  EVALUATION COMPLEXITY: Moderate   GOALS: Goals reviewed with patient? No  SHORT TERM GOALS: Target date: 03/21/2022  Pt will be independent with HEP in order to improve strength and decrease back pain to improve pain-free function at home and work. Baseline: 02/28/22: pt HEP was reviewed for carryover effect.  Goal status: INITIAL  2.  Pt will increase in right knee flexion AROM up to 120 degrees for ability to complete transferring from surfaces of different heights, bathing, dressing, etc.      Baseline: 02/28/22: +3 - 85 degree R knee AROM      Goal status: INITIAL   LONG TERM GOALS: Target date: 04/19/2022  Pt will increase FOTO to at least 57 to demonstrate significant improvement in function at home and work related to back pain  Baseline: 02/28/22:  41  Goal status: INITIAL  2.  Pt will decrease worst back pain by at least 2 points on the NPRS in order to demonstrate clinically significant reduction in back pain. Baseline: 02/28/22:  10/10  Goal status: INITIAL  3.  Pt will increase in strength of tested LE musculature for ability to tolerate standing demands  of job and ability to ambulate for longer durations that are necessary with grocery shopping. Baseline: 02/28/22:  grossly 3-4/5 (see table above) Goal status: INITIAL  4.  Pt will independently negotiate stairs equivalent to entering to show independence with accessing son's home.       Baseline: 02/28/22:   difficulty with negotiating home steps.       Goal status: INITIAL   PLAN: PT FREQUENCY: 1-2x/week  PT DURATION: 8 weeks  PLANNED INTERVENTIONS: Therapeutic exercises, Therapeutic activity, Neuromuscular re-education, Balance training, Gait training, Patient/Family education, Self Care, Joint mobilization, Joint manipulation, Orthotic/Fit training, DME instructions, Dry Needling, Electrical stimulation, Spinal manipulation, Spinal mobilization, Cryotherapy, Moist heat, Taping, Traction, Manual therapy, and Re-evaluation.  PLAN FOR NEXT SESSION: inquire more on home environment, continue with right LE nerve glides, and strength of quads and hip flexors. Soft tissue work on right LE for desensitization and LE traction to decrease pain.      Consuela Mimes, PT, DPT [phone]  Cora Collum, SPT 02/28/2022, 5:34 PM

## 2022-03-07 ENCOUNTER — Ambulatory Visit: Payer: Medicaid Other | Admitting: Physical Therapy

## 2022-03-07 ENCOUNTER — Telehealth: Payer: Self-pay | Admitting: Physical Therapy

## 2022-03-07 NOTE — Telephone Encounter (Signed)
Left voicemail regarding missed appointment today and plan for accommodation to schedule/schedule change for PT given potential conflict on Mondays at 7:01 AM.

## 2022-03-09 ENCOUNTER — Ambulatory Visit: Payer: Medicaid Other | Admitting: Physical Therapy

## 2022-03-09 NOTE — Therapy (Deleted)
OUTPATIENT PHYSICAL THERAPY TREATMENT NOTE   Patient Name: Claudia Mills MRN: [id number] DOB:[birth year], 52 y.o., female Today's Date: 03/07/2022  PCP: [facility] REFERRING PROVIDER: Donnie Coffin, MD  END OF SESSION:      Past Medical History:  Diagnosis Date   Diabetes mellitus without complication ([place])    MI (myocardial infarction) ([place])    Past Surgical History:  Procedure Laterality Date   CORONARY ANGIOPLASTY WITH STENT PLACEMENT     There are no problems to display for this patient.   REFERRING DIAG:  M25.561 (ICD-10-CM) - Pain in right knee  M79.604 (ICD-10-CM) - Pain in right leg  M54.50 (ICD-10-CM) - Low back pain, unspecified    THERAPY DIAG:  Other low back pain  Muscle weakness (generalized)  Joint stiffness of spine  Rationale for Evaluation and Treatment Rehabilitation  PERTINENT HISTORY: Patient reports right LE giving out while walking longer distances in [place] and prefers to use shopping cart as a way to keep balance. Patient states that using the steps is difficult. Patient states that they prefer to use the a shopping cart for balance. Has some numbness and tingling in the lower leg, long distances make it worse and a hot rag and CBD lotion make it feel better. 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 Goals: decrease pain and get stronger. And stepping up the steps. Wakes up in the middle in the night with aching and is taking Gabapentin and states that helps for a short time and then will wake up in the middle of the night. Standing work with folding socks and struggles walking to the bathroom. Lost a good amount of weight recently and believes it to be due to diabetes, switch in medications, and poor nutrition.   PAIN:    Pain Intensity: Present: 0/10, Best: 0/10, Worst: 10/10 Pain location: low back with referring to anterior right thigh Pain Quality: intermittent,  burning, and aching  Radiating: Yes  Numbness/Tingling: Yes, R anterior thigh Focal Weakness: Yes right LE weakness with intermittent buckling  Aggravating factors: walking long distances in [employer] and trying to climb the stairs Relieving factors: Rest and heat 24-hour pain behavior: increases with longer activities. Will wake up from sleep with  History of prior back injury, pain, surgery, or therapy: Yes   Imaging: Yes ,   Negative DVT screen   Radiograph of R femur (09/20/21): Negative   L-spine CT Scan 04/07/21 IMPRESSION: 1. L2 compression fracture with 25% height loss, likely recent given mild paravertebral soft tissue edema/hematoma. 2. Multilevel disc degeneration, most severe at L4-5 where there is severe spinal stenosis and severe right neural foraminal stenosis. 3. Moderate spinal stenosis at L3-4. 4. Asymmetric left lateral recess stenosis and moderate bilateral neural foraminal stenosis at L5-S1.     Red flags: Negative for bowel/bladder changes, saddle paresthesia, personal history of cancer, h/o spinal tumors, h/o compression fx, h/o abdominal aneurysm, abdominal pain, chills/fever, night sweats, nausea, vomiting, unrelenting pain, first onset of insidious LBP <20 y/o             -history of nocturnal pain and weight loss (explained with poor nutrition)     PRECAUTIONS: None   WEIGHT BEARING RESTRICTIONS: No   FALLS: Has patient fallen in last 6 months? Yes. 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       PRECAUTIONS: Fall history    SUBJECTIVE:                                                                                                                                                                                      SUBJECTIVE STATEMENT:  ***   PAIN:  Are you having pain? {[id number]}   OBJECTIVE: (objective measures completed at initial evaluation unless otherwise dated)  Patient Surveys  FOTO currently 3 with a prediction of 74   Cognition Patient is oriented to person, place, and time.  Recent memory is intact.  Remote memory is intact.  Attention span and concentration are intact.  Expressive speech is intact.  Patient's fund of knowledge is within normal limits for educational level.                          Gross Musculoskeletal Assessment Tremor: None Bulk: Normal Tone: Normal No visible step-off along spinal column, no signs of scoliosis     GAIT: Comments: Antalgic gait on right LE, decreased terminal knee extension at terminal swing RLE, decreased RLE weight shift; forward flexed posture maintained throughout gait cycle    Posture: Lumbar lordosis: Decreased Increased thoracic kyphosis Forward head, rounded shoulders   AROM     AROM (Normal range in degrees) AROM   Lumbar    Flexion (65)  Mod motion loss *  Extension (30) Mod motion loss *  Right lateral flexion (25) Min motion loss *  Left lateral flexion (25) Min motion loss  Right rotation (30) WNL  Left rotation (30) WNL                 Hip Right Left  Flexion (125) 90* 100  Extension (15)      Abduction (40)      Adduction       Internal Rotation (45)      External Rotation (45)             Knee      Flexion (135) 85 120  Extension (0) +3 +4         Ankle      Dorsiflexion (20)      Plantarflexion (50)      Inversion (35)  Eversion (15)      (* = pain; Blank rows = not tested)     PROM Hip flexion: R 60, L 105 Knee flexion: R 85, L 145      LE MMT: MMT (out of 5) Right   Left    Hip flexion 3 3  Hip extension      Hip abduction 4 4  Hip adduction 4 4  Hip internal rotation      Hip external rotation      Knee flexion 4 4  Knee extension 3 3  Ankle dorsiflexion 4 5  Ankle plantarflexion      Ankle inversion 5 5  Ankle eversion      (* = pain; Blank rows = not tested)   Sensation Deferred   Reflexes Deferred   Muscle Length Hamstrings: R: Not examined L: Not examined Ely (quadriceps): R: Positive for back pain L: Not examined Thomas (hip flexors): R: Not examined L: Not examined Ober: R: Not examined L: Not examined   Palpation Location Right Left         Lumbar paraspinals      Quadratus Lumborum      Gluteus Maximus 1 0  Gluteus Medius 1 0  Deep hip external rotators 2 0  PSIS      Fortin's Area (SIJ)      Adductors 2    Hamstrings 1    Quadriceps  2    Gastrocnemius/soleus 2    (Blank rows = not tested) Graded on 0-4 scale (0 = no pain, 1 = pain, 2 = pain with wincing/grimacing/flinching, 3 = pain with withdrawal, 4 = unwilling to allow palpation)   Passive Accessory Intervertebral Motion Spasm end-feel with L3-5 CPA. Empty end-feel L1-2, pain prior to restriction.      Special Tests Lumbar Radiculopathy and Discogenic: Centralization and Peripheralization (SN 92, -LR 0.12): Negative Slump (SN 83, -LR 0.32): R: Positive L: Negative SLR (SN 92, -LR 0.29): R: Positive L:  Negative     Facet Joint: Extension-Rotation (SN 100, -LR 0.0): R: Not examined L: Not examined   Lumbar Foraminal Stenosis: Lumbar quadrant (SN 70): R: Not examined L: Not examined   Hip: FABER (SN 81): R: Positive L: Not examined FADIR (SN 94): R: Negative L: Not examined Hip scour (SN 50): R: Positive for back pain L: Not examined   SIJ:  Thigh Thrust (SN 88, -LR 0.18) : R: Not examined L: Not examined  Piriformis Syndrome: FAIR Test (SN 88, SP 83): R: Not examined L: Not examined        TODAY'S  TREATMENT: DATE: 03/09/2022       Therapeutic Exercise - for HEP establishment, discussion on appropriate exercise/activity modification, PT education     Reviewed baseline home exercises and provided handout for [facility] program (see Access Code); tactile cueing and therapist demonstration utilized as needed for carryover of proper technique to HEP.     Patient education on current condition, anatomy involved, prognosis, plan of care.        PATIENT EDUCATION:  Education details: Pt educated on HEP and activity modification with right LE Person educated: Patient Education method: Explanation, Demonstration, and Handouts Education comprehension: verbalized understanding     HOME EXERCISE PROGRAM:    Access Code: [email or web address] URL: [email or web address] Date: 02/28/2022 Prepared by: Valentina Gu   Exercises - Prone on Elbows Stretch  - 3-4 x daily - 7 x weekly - 3-45mn hold - Prone Femoral Nerve Mobilization  - 2  x daily - 7 x weekly - 2 sets - 10 reps - 1sec hold - Supine Heel Slide with Strap  - 2 x daily - 7 x weekly - 2 sets - 10 reps - 3sec hold   ASSESSMENT:   CLINICAL IMPRESSION: Patient is a 52 y.o. female who was seen today for physical therapy evaluation and treatment for low back and right LE pain. Patient has history of treatment for atraumatic low back pain in 2023. Pt sustained fall in December 2023 with acute flare-up of R lower quarter symptoms and R leg pain. Patient had contusion of R knee following trauma with DVT and fracture rule out. Patient currently presents with decreased LE strength, decreased ROM of R hip and knee, decreased dural/neural mobility for anterior/posterior LE chain, anterior thigh hypersensitivity and taut/tender R gluteal musculature and R>L lumbar paraspinals, activity limitations in prolonged walking and stair negotiation. Patient will benefit from skilled PT to address the noted deficits as needed for best return to PLOF.    OBJECTIVE IMPAIRMENTS: decreased activity tolerance, decreased ROM, and decreased strength.    ACTIVITY LIMITATIONS:  walking far distances and using the steps.   PARTICIPATION LIMITATIONS: shopping and occupation   PERSONAL FACTORS: Fitness, Past/current experiences, Social background, and 1 comorbidity: diabetes  are also affecting patient's functional outcome.    REHAB POTENTIAL: Good   CLINICAL DECISION MAKING: Evolving/moderate complexity   EVALUATION COMPLEXITY: Moderate     GOALS: Goals reviewed with patient? No   SHORT TERM GOALS: Target date: 03/21/2022   Pt will be independent with HEP in order to improve strength and decrease back pain to improve pain-free function at home and work. Baseline: 02/28/22: pt HEP was reviewed for carryover effect.  Goal status: INITIAL   2.  Pt will increase in right knee flexion AROM up to 120 degrees for ability to complete transferring from surfaces of different heights, bathing, dressing, etc.      Baseline: 02/28/22: +3 - 85 degree R knee AROM      Goal status: INITIAL     LONG TERM GOALS: Target date: 04/19/2022   Pt will increase FOTO to at least 57 to demonstrate significant improvement in function at home and work related to back pain  Baseline: 02/28/22:  41  Goal status: INITIAL   2.  Pt will decrease worst back pain by at least 2 points on the NPRS in order to demonstrate clinically significant reduction in back pain. Baseline: 02/28/22:  10/10  Goal status: INITIAL   3.  Pt will increase in strength of tested LE musculature for ability to tolerate standing demands of job and ability to ambulate for longer durations that are necessary with grocery shopping. Baseline: 02/28/22:  grossly 3-4/5 (see table above) Goal status: INITIAL   4.  Pt will independently negotiate stairs equivalent to entering to show independence with accessing son's home.       Baseline: 02/28/22:   difficulty with negotiating home steps.       Goal  status: INITIAL     PLAN: PT FREQUENCY: 1-2x/week   PT DURATION: 8 weeks   PLANNED INTERVENTIONS: Therapeutic exercises, Therapeutic activity, Neuromuscular re-education, Balance training, Gait training, Patient/Family education, Self Care, Joint mobilization, Joint manipulation, Orthotic/Fit training, DME instructions, Dry Needling, Electrical stimulation, Spinal manipulation, Spinal mobilization, Cryotherapy, Moist heat, Taping, Traction, Manual therapy, and Re-evaluation.   PLAN FOR NEXT SESSION: inquire more on home environment, continue with right LE nerve glides, and strength of quads and hip flexors. Soft  tissue work on right LE for desensitization and LE traction to decrease pain.        Eilleen Kempf 03/09/2022 8:01 AM

## 2022-03-14 ENCOUNTER — Ambulatory Visit: Payer: Medicaid Other | Admitting: Physical Therapy

## 2022-03-14 DIAGNOSIS — M2560 Stiffness of unspecified joint, not elsewhere classified: Secondary | ICD-10-CM

## 2022-03-14 DIAGNOSIS — M6281 Muscle weakness (generalized): Secondary | ICD-10-CM

## 2022-03-14 DIAGNOSIS — M5459 Other low back pain: Secondary | ICD-10-CM | POA: Diagnosis not present

## 2022-03-14 NOTE — Therapy (Signed)
OUTPATIENT PHYSICAL THERAPY TREATMENT NOTE   Patient Name: Claudia Mills MRN: [id number] DOB:May 12, 1970, 52 y.o., female Today's Date: 03/14/2022   END OF SESSION:    PT End of Session - 03/15/22 0926     Visit Number 2    Number of Visits 17    Date for PT Re-Evaluation 04/25/22    Authorization Type [place] Completed Medicaid, Auth req'd;   initial eval 02/28/22    Progress Note Due on Visit 10    PT Start Time 1018    PT Stop Time 1101    PT Time Calculation (min) 43 min    Activity Tolerance Patient limited by pain    Behavior During Therapy WFL for tasks assessed/performed              Past Medical History:  Diagnosis Date   Diabetes mellitus without complication ([place])    MI (myocardial infarction) ([place])    Past Surgical History:  Procedure Laterality Date   CORONARY ANGIOPLASTY WITH STENT PLACEMENT     There are no problems to display for this patient.   PCP: [facility] [place] REFERRING PROVIDER: Donnie Coffin, MD   REFERRING DIAG:  [phone] (ICD-10-CM) - Pain in right knee  M79.604 (ICD-10-CM) - Pain in right leg  M54.50 (ICD-10-CM) - Low back pain, unspecified    THERAPY DIAG:  Other low back pain  Muscle weakness (generalized)  Joint stiffness of spine  Rationale for Evaluation and Treatment Rehabilitation  PERTINENT HISTORY: Patient reports right LE giving out while walking longer distances in [place] and prefers to use shopping cart as a way to keep balance. Patient states that using the steps is difficult. Patient states that they prefer to use the a shopping cart for balance. Has some numbness and tingling in the lower leg, long distances make it worse and a hot rag and CBD lotion make it feel better. 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 Goals: decrease pain and get stronger. And stepping up the steps. Wakes up in the middle in the night with aching and is taking  Gabapentin and states that helps for a short time and then will wake up in the middle of the night. Standing work with folding socks and struggles walking to the bathroom. Lost a good amount of weight recently and believes it to be due to diabetes, switch in medications, and poor nutrition.   PAIN:    Pain Intensity: Present: 0/10, Best: 0/10, Worst: 10/10 Pain location: low back with referring to anterior right thigh Pain Quality: intermittent, burning, and aching  Radiating: Yes  Numbness/Tingling: Yes, R anterior thigh Focal Weakness: Yes right LE weakness with intermittent buckling  Aggravating factors: walking long distances in [employer] and trying to climb the stairs Relieving factors: Rest and heat 24-hour pain behavior: increases with longer activities. Will wake up from sleep with  History of prior back injury, pain, surgery, or therapy: Yes   Imaging: Yes ,   Negative DVT screen   Radiograph of R femur (09/20/21): Negative   L-spine CT Scan 04/07/21 IMPRESSION: 1. L2 compression fracture with 25% height loss, likely recent given mild paravertebral soft tissue edema/hematoma. 2. Multilevel disc degeneration, most severe at L4-5 where there is severe spinal stenosis and severe right neural foraminal stenosis. 3. Moderate spinal stenosis at L3-4. 4. Asymmetric left lateral recess stenosis and moderate bilateral neural foraminal stenosis at L5-S1.     Red  flags: Negative for bowel/bladder changes, saddle paresthesia, personal history of cancer, h/o spinal tumors, h/o compression fx, h/o abdominal aneurysm, abdominal pain, chills/fever, night sweats, nausea, vomiting, unrelenting pain, first onset of insidious LBP <20 y/o             -history of nocturnal pain and weight loss (explained with poor nutrition)     PRECAUTIONS: None   WEIGHT BEARING RESTRICTIONS: No   FALLS: Has patient fallen in last 6 months? Yes. 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       PRECAUTIONS: Fall history    SUBJECTIVE:                                                                                                                                                                                      SUBJECTIVE STATEMENT:  Patient reports partial compliance with HEP e.g. prone on elbows. Patient reports she has poor HEP compliance. Patient reports pain in her lower lumbar region. Patient reports pain in her leg is better. Pt is continuing with Gabapentin for neuropathy. Pt feels that duloxetine is helping too. Pt feels that her R knee is improving.    PAIN:  Are you having pain? Yes: NPRS scale: 1-2/10 Pain location: L-sided low back pain     OBJECTIVE: (objective measures completed at initial evaluation unless otherwise dated)  Patient Surveys  FOTO currently 16 with a prediction of 20   Cognition Patient is oriented to person, place, and time.  Recent memory is intact.  Remote memory is intact.  Attention span and concentration are intact.  Expressive speech is intact.  Patient's fund of knowledge is within normal limits for educational level.                          Gross Musculoskeletal Assessment Tremor: None Bulk: Normal Tone: Normal No visible step-off along spinal column, no signs of scoliosis     GAIT: Comments: Antalgic gait on right LE, decreased terminal knee  extension at terminal swing RLE, decreased RLE weight shift;  forward flexed posture maintained throughout gait cycle    Posture: Lumbar lordosis: Decreased Increased thoracic kyphosis Forward head, rounded shoulders   AROM     AROM (Normal range in degrees) AROM   Lumbar    Flexion (65)  Mod motion loss *  Extension (30) Mod motion loss *  Right lateral flexion (25) Min motion loss *  Left lateral flexion (25) Min motion loss  Right rotation (30) WNL  Left rotation (30) WNL                 Hip Right Left  Flexion (125) 90* 100  Extension (15)      Abduction (40)      Adduction       Internal Rotation (45)      External Rotation (45)             Knee      Flexion (135) 85 120  Extension (0) +3 +4         Ankle      Dorsiflexion (20)      Plantarflexion (50)      Inversion (35)      Eversion (15)      (* = pain; Blank rows = not tested)     PROM Hip flexion: R 60, L 105 Knee flexion: R 85, L 145     LE MMT: MMT (out of 5) Right   Left    Hip flexion 3 3  Hip extension      Hip abduction 4 4  Hip adduction 4 4  Hip internal rotation      Hip external rotation      Knee flexion 4 4  Knee extension 3 3  Ankle dorsiflexion 4 5  Ankle plantarflexion      Ankle inversion 5 5  Ankle eversion      (* = pain; Blank rows = not tested)   Sensation Deferred   Reflexes Deferred   Muscle Length Hamstrings: R: Not examined L: Not examined Ely (quadriceps): R: Positive for back pain L: Not examined Thomas (hip flexors): R: Not examined L: Not examined Ober: R: Not examined L: Not examined   Palpation Location Right Left         Lumbar paraspinals      Quadratus Lumborum      Gluteus Maximus 1 0  Gluteus Medius 1 0  Deep hip external rotators 2 0  PSIS      Fortin's Area (SIJ)      Adductors 2    Hamstrings 1    Quadriceps  2    Gastrocnemius/soleus 2    (Blank rows = not tested) Graded on 0-4 scale (0 = no pain, 1 = pain, 2 = pain with  wincing/grimacing/flinching, 3 = pain with withdrawal, 4 = unwilling to allow palpation)   Passive Accessory Intervertebral Motion Spasm end-feel with L3-5 CPA. Empty end-feel L1-2, pain prior to restriction.      Special Tests Lumbar Radiculopathy and Discogenic: Centralization and Peripheralization (SN 92, -LR 0.12): Negative Slump (SN 83, -LR 0.32): R: Positive L: Negative SLR (SN 92, -LR 0.29): R: Positive L:  Negative     Facet Joint: Extension-Rotation (SN 100, -LR 0.0): R: Not examined L: Not examined   Lumbar Foraminal Stenosis: Lumbar quadrant (SN 70): R: Not examined L: Not examined   Hip: FABER (SN 81): R: Positive L: Not examined FADIR (SN 94): R: Negative L: Not examined Hip scour (SN 50): R: Positive  for back pain L: Not examined   SIJ:  Thigh Thrust (SN 88, -LR 0.18) : R: Not examined L: Not examined  Piriformis Syndrome: FAIR Test (SN 88, SP 83): R: Not examined L: Not examined        TODAY'S TREATMENT: DATE: 03/14/2022       Manual Therapy - for symptom modulation, soft tissue sensitivity and mobility, joint mobility, ROM   STM/DTM L L3-L5 erector spinae, L gluteus maximus/medius; x 10 minutes  Bilateral long-leg distraction with Mulligan belt, therapist at foot of table; 10 sec on, 5 sec off; x 5 minutes     Therapeutic Exercise - for improved soft tissue flexibility and extensibility as needed for ROM, dural/neural mobility as needed for lumbopelvic ROM, functional LE strength needed for transfers and stair negotiation  Femoral nerve glides, prone; 2x10 - for HEP review  -therapist demonstration and verbal cueing for degree of stretch/tension at end-range Supine piriformis stretch (figure-4, pull); 3x30 sec Lower trunk rotations, hooklying; x10 ea dir  -Pt demonstrates full knee flexion and extension AROM today   Sit to stand; 2x10, on edge of table      PATIENT EDUCATION:  Education details: see above for patient education  details Person educated: Patient Education method: Explanation, Demonstration, and Handouts Education comprehension: verbalized understanding     HOME EXERCISE PROGRAM:   Access Code: [email or web address] URL: [email or web address] Date: 02/28/2022 Prepared by: Valentina Gu   Exercises - Prone on Elbows Stretch  - 3-4 x daily - 7 x weekly - 3-54min hold - Prone Femoral Nerve Mobilization  - 2 x daily - 7 x weekly - 2 sets - 10 reps - 1sec hold - Supine Heel Slide with Strap  - 2 x daily - 7 x weekly - 2 sets - 10 reps - 3sec hold     ASSESSMENT:   CLINICAL IMPRESSION: Patient does have significantly lower NPRS today versus that reported at initial evaluation. She reports improving R knee pain and she exhibits improving R knee flexion AROM tolerated. Pt is able to bear weight onto RLE better and she has lessening c/o RLE pain/paresthesias. Pt tolerates modest progression of ROM and weightbearing exercise well without notable c/o pain. Patient currently presents with decreased LE strength, decreased ROM of R hip and knee, decreased dural/neural mobility for anterior/posterior LE chain, anterior thigh hypersensitivity and taut/tender R gluteal musculature and R>L lumbar paraspinals, activity limitations in prolonged walking and stair negotiation. Patient will benefit from skilled PT to address the noted deficits as needed for best return to PLOF.   OBJECTIVE IMPAIRMENTS: decreased activity tolerance, decreased ROM, and decreased strength.    ACTIVITY LIMITATIONS:  walking far distances and using the steps.   PARTICIPATION LIMITATIONS: shopping and occupation   PERSONAL FACTORS: Fitness, Past/current experiences, Social background, and 1 comorbidity: diabetes  are also affecting patient's functional outcome.    REHAB POTENTIAL: Good   CLINICAL DECISION MAKING: Evolving/moderate complexity   EVALUATION COMPLEXITY: Moderate     GOALS: Goals reviewed with patient? No   SHORT  TERM GOALS: Target date: 03/21/2022   Pt will be independent with HEP in order to improve strength and decrease back pain to improve pain-free function at home and work. Baseline: 02/28/22: pt HEP was reviewed for carryover effect.  Goal status: INITIAL   2.  Pt will increase in right knee flexion AROM up to 120 degrees for ability to complete transferring from surfaces of different heights, bathing, dressing, etc.      Baseline: 02/28/22: +3 -  85 degree R knee AROM      Goal status: INITIAL     LONG TERM GOALS: Target date: 04/19/2022   Pt will increase FOTO to at least 57 to demonstrate significant improvement in function at home and work related to back pain  Baseline: 02/28/22:  41  Goal status: INITIAL   2.  Pt will decrease worst back pain by at least 2 points on the NPRS in order to demonstrate clinically significant reduction in back pain. Baseline: 02/28/22:  10/10  Goal status: INITIAL   3.  Pt will increase in strength of tested LE musculature for ability to tolerate standing demands of job and ability to ambulate for longer durations that are necessary with grocery shopping. Baseline: 02/28/22:  grossly 3-4/5 (see table above) Goal status: INITIAL   4.  Pt will independently negotiate stairs equivalent to entering to show independence with accessing son's home.       Baseline: 02/28/22:   difficulty with negotiating home steps.       Goal status: INITIAL     PLAN: PT FREQUENCY: 1-2x/week   PT DURATION: 8 weeks   PLANNED INTERVENTIONS: Therapeutic exercises, Therapeutic activity, Neuromuscular re-education, Balance training, Gait training, Patient/Family education, Self Care, Joint mobilization, Joint manipulation, Orthotic/Fit training, DME instructions, Dry Needling, Electrical stimulation, Spinal manipulation, Spinal mobilization, Cryotherapy, Moist heat, Taping, Traction, Manual therapy, and Re-evaluation.   PLAN FOR NEXT SESSION: Continue with right LE nerve glides, and  strength of quads and hip flexors. Traction as needed for pain relief. Restoration of R hip/knee ROM. Manual therapy and soft tissue work for low back prn.      Consuela Mimes, PT, DPT #[id number]  Gertie Exon 03/15/2022 9:27 AM

## 2022-03-15 ENCOUNTER — Encounter: Payer: Self-pay | Admitting: Physical Therapy

## 2022-03-16 ENCOUNTER — Encounter: Payer: Medicaid Other | Admitting: Physical Therapy

## 2022-03-16 DIAGNOSIS — I214 Non-ST elevation (NSTEMI) myocardial infarction: Secondary | ICD-10-CM | POA: Diagnosis not present

## 2022-03-16 DIAGNOSIS — E7800 Pure hypercholesterolemia, unspecified: Secondary | ICD-10-CM | POA: Diagnosis not present

## 2022-03-16 DIAGNOSIS — E119 Type 2 diabetes mellitus without complications: Secondary | ICD-10-CM | POA: Insufficient documentation

## 2022-03-16 DIAGNOSIS — I10 Essential (primary) hypertension: Secondary | ICD-10-CM | POA: Diagnosis not present

## 2022-03-21 ENCOUNTER — Ambulatory Visit: Payer: Medicaid Other | Admitting: Physical Therapy

## 2022-03-21 NOTE — Therapy (Deleted)
OUTPATIENT PHYSICAL THERAPY TREATMENT NOTE   Patient Name: Claudia Mills MRN: [id number] DOB:[birth year], 52 y.o., female Today's Date: 03/14/2022   END OF SESSION:       Past Medical History:  Diagnosis Date   Diabetes mellitus without complication ([place])    MI (myocardial infarction) ([place])    Past Surgical History:  Procedure Laterality Date   CORONARY ANGIOPLASTY WITH STENT PLACEMENT     There are no problems to display for this patient.   PCP: [facility] [place] REFERRING PROVIDER: Donnie Coffin, MD   REFERRING DIAG:  [phone] (ICD-10-CM) - Pain in right knee  M79.604 (ICD-10-CM) - Pain in right leg  M54.50 (ICD-10-CM) - Low back pain, unspecified    THERAPY DIAG:  Other low back pain  Muscle weakness (generalized)  Joint stiffness of spine  Rationale for Evaluation and Treatment Rehabilitation  PERTINENT HISTORY: Patient reports right LE giving out while walking longer distances in [place] and prefers to use shopping cart as a way to keep balance. Patient states that using the steps is difficult. Patient states that they prefer to use the a shopping cart for balance. Has some numbness and tingling in the lower leg, long distances make it worse and a hot rag and CBD lotion make it feel better. 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 Goals: decrease pain and get stronger. And stepping up the steps. Wakes up in the middle in the night with aching and is taking Gabapentin and states that helps for a short time and then will wake up in the middle of the night. Standing work with folding socks and struggles walking to the bathroom. Lost a good amount of weight recently and believes it to be due to diabetes, switch in medications, and poor nutrition.   PAIN:    Pain Intensity: Present: 0/10, Best: 0/10, Worst: 10/10 Pain location: low back with referring to anterior right thigh Pain Quality:  intermittent, burning, and aching  Radiating: Yes  Numbness/Tingling: Yes, R anterior thigh Focal Weakness: Yes right LE weakness with intermittent buckling  Aggravating factors: walking long distances in [employer] and trying to climb the stairs Relieving factors: Rest and heat 24-hour pain behavior: increases with longer activities. Will wake up from sleep with  History of prior back injury, pain, surgery, or therapy: Yes   Imaging: Yes ,   Negative DVT screen   Radiograph of R femur (09/20/21): Negative   L-spine CT Scan 04/07/21 IMPRESSION: 1. L2 compression fracture with 25% height loss, likely recent given mild paravertebral soft tissue edema/hematoma. 2. Multilevel disc degeneration, most severe at L4-5 where there is severe spinal stenosis and severe right neural foraminal stenosis. 3. Moderate spinal stenosis at L3-4. 4. Asymmetric left lateral recess stenosis and moderate bilateral neural foraminal stenosis at L5-S1.     Red flags: Negative for bowel/bladder changes, saddle paresthesia, personal history of cancer, h/o spinal tumors, h/o compression fx, h/o abdominal aneurysm, abdominal pain, chills/fever, night sweats, nausea, vomiting, unrelenting pain, first onset of insidious LBP <20 y/o             -history of nocturnal pain and weight loss (explained with poor nutrition)     PRECAUTIONS: None   WEIGHT BEARING RESTRICTIONS: No   FALLS: Has patient fallen in last 6 months? Yes. 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       PRECAUTIONS: Fall history    SUBJECTIVE:                                                                                                                                                                                      SUBJECTIVE STATEMENT:  Patient reports partial compliance with HEP e.g. prone on elbows. Patient reports she has poor HEP compliance. Patient reports pain in her lower lumbar region. Patient reports pain in her leg is better. Pt is continuing with Gabapentin for neuropathy. Pt feels that duloxetine is helping too. Pt feels that her R knee is improving.    PAIN:  Are you having pain? Yes: NPRS scale: 1-2/10 Pain location: L-sided low back pain     OBJECTIVE: (objective measures completed at initial evaluation unless otherwise dated)  Patient Surveys  FOTO currently 57 with a prediction of 65   Cognition Patient is oriented to person, place, and time.  Recent memory is intact.  Remote memory is intact.  Attention span and concentration are intact.  Expressive speech is intact.  Patient's fund of knowledge is within normal limits for educational level.                          Gross Musculoskeletal Assessment Tremor: None Bulk: Normal Tone: Normal No visible step-off along spinal column, no signs of scoliosis     GAIT: Comments: Antalgic gait on right LE, decreased terminal knee extension at terminal swing RLE, decreased RLE weight shift; forward flexed posture maintained throughout gait cycle    Posture: Lumbar lordosis: Decreased Increased thoracic kyphosis Forward head, rounded shoulders   AROM     AROM (Normal range in degrees) AROM   Lumbar    Flexion (65)  Mod motion loss *  Extension (30) Mod motion loss *  Right lateral flexion (25) Min motion loss *  Left lateral flexion (25) Min motion loss   Right rotation (30) WNL  Left rotation (30) WNL                 Hip Right Left  Flexion (125) 90* 100  Extension (15)      Abduction (40)      Adduction       Internal Rotation (45)      External Rotation (45)             Knee      Flexion (135) 85 120  Extension (0) +3 +4         Ankle      Dorsiflexion (20)      Plantarflexion (50)      Inversion (35)      Eversion (15)      (* = pain; Blank rows = not tested)     PROM Hip flexion: R 60, L 105 Knee flexion: R 85, L 145     LE MMT: MMT (out of 5) Right   Left    Hip flexion 3 3  Hip extension      Hip abduction 4 4  Hip adduction 4 4  Hip internal rotation      Hip external rotation      Knee flexion 4 4  Knee extension 3 3  Ankle dorsiflexion 4 5  Ankle plantarflexion      Ankle inversion 5 5  Ankle eversion      (* = pain; Blank rows = not tested)   Sensation Deferred   Reflexes Deferred   Muscle Length Hamstrings: R: Not examined L: Not examined Ely (quadriceps): R: Positive for back pain L: Not examined Thomas (hip flexors): R: Not examined L: Not examined Ober: R: Not examined L: Not examined   Palpation Location Right Left         Lumbar paraspinals      Quadratus Lumborum      Gluteus Maximus 1 0  Gluteus Medius 1 0  Deep hip external rotators 2 0  PSIS      Fortin's Area (SIJ)      Adductors 2    Hamstrings 1    Quadriceps  2    Gastrocnemius/soleus 2    (Blank rows = not tested) Graded on 0-4 scale (0 = no pain, 1 = pain, 2 = pain with wincing/grimacing/flinching, 3 = pain with withdrawal, 4 = unwilling to allow palpation)   Passive Accessory Intervertebral Motion Spasm end-feel with L3-5 CPA. Empty end-feel L1-2, pain prior to restriction.      Special Tests Lumbar Radiculopathy and Discogenic: Centralization and Peripheralization (SN 92, -LR 0.12): Negative Slump (SN 83, -LR 0.32): R: Positive L: Negative SLR (SN 92, -LR 0.29): R: Positive L:  Negative     Facet  Joint: Extension-Rotation (SN 100, -LR 0.0): R: Not examined L: Not examined   Lumbar Foraminal Stenosis: Lumbar quadrant (SN 70): R: Not examined L: Not examined   Hip: FABER (SN 81): R: Positive L: Not examined FADIR (SN 94): R: Negative L: Not examined Hip scour (SN 50): R: Positive for back pain L: Not examined   SIJ:  Thigh Thrust (SN 88, -LR 0.18) : R: Not examined L: Not examined  Piriformis Syndrome: FAIR Test (SN 88, SP 83): R: Not examined L: Not examined        TODAY'S TREATMENT: DATE: 03/14/2022       Manual Therapy - for symptom modulation, soft tissue sensitivity and mobility, joint mobility, ROM   STM/DTM L L3-L5 erector spinae, L gluteus maximus/medius; x 10 minutes  Bilateral long-leg distraction with Mulligan belt, therapist at foot of table; 10 sec on, 5 sec off; x 5 minutes  Therapeutic Exercise - for improved soft tissue flexibility and extensibility as needed for ROM, dural/neural mobility as needed for lumbopelvic ROM, functional LE strength needed for transfers and stair negotiation  Femoral nerve glides, prone; 2x10 - for HEP review  -therapist demonstration and verbal cueing for degree of stretch/tension at end-range Supine piriformis stretch (figure-4, pull); 3x30 sec Lower trunk rotations, hooklying; x10 ea dir  -Pt demonstrates full knee flexion and extension AROM today   Sit to stand; 2x10, on edge of table      PATIENT EDUCATION:  Education details: see above for patient education details Person educated: Patient Education method: Explanation, Demonstration, and Handouts Education comprehension: verbalized understanding     HOME EXERCISE PROGRAM:   Access Code: [facility] URL: [email or web address] Date: 02/28/2022 Prepared by: Valentina Gu   Exercises - Prone on Elbows Stretch  - 3-4 x daily - 7 x weekly - 3-43mn hold - Prone Femoral Nerve Mobilization  - 2 x daily - 7 x weekly - 2 sets - 10 reps - 1sec  hold - Supine Heel Slide with Strap  - 2 x daily - 7 x weekly - 2 sets - 10 reps - 3sec hold     ASSESSMENT:   CLINICAL IMPRESSION: Patient does have significantly lower NPRS today versus that reported at initial evaluation. She reports improving R knee pain and she exhibits improving R knee flexion AROM tolerated. Pt is able to bear weight onto RLE better and she has lessening c/o RLE pain/paresthesias. Pt tolerates modest progression of ROM and weightbearing exercise well without notable c/o pain. Patient currently presents with decreased LE strength, decreased ROM of R hip and knee, decreased dural/neural mobility for anterior/posterior LE chain, anterior thigh hypersensitivity and taut/tender R gluteal musculature and R>L lumbar paraspinals, activity limitations in prolonged walking and stair negotiation. Patient will benefit from skilled PT to address the noted deficits as needed for best return to PLOF.   OBJECTIVE IMPAIRMENTS: decreased activity tolerance, decreased ROM, and decreased strength.    ACTIVITY LIMITATIONS:  walking far distances and using the steps.   PARTICIPATION LIMITATIONS: shopping and occupation   PERSONAL FACTORS: Fitness, Past/current experiences, Social background, and 1 comorbidity: diabetes  are also affecting patient's functional outcome.    REHAB POTENTIAL: Good   CLINICAL DECISION MAKING: Evolving/moderate complexity   EVALUATION COMPLEXITY: Moderate     GOALS: Goals reviewed with patient? No   SHORT TERM GOALS: Target date: 03/21/2022   Pt will be independent with HEP in order to improve strength and decrease back pain to improve pain-free function at home and work. Baseline: 02/28/22: pt HEP was reviewed for carryover effect.  Goal status: INITIAL   2.  Pt will increase in right knee flexion AROM up to 120 degrees for ability to complete transferring from surfaces of different heights, bathing, dressing, etc.      Baseline: 02/28/22: +3 - 85 degree  R knee AROM      Goal status: INITIAL     LONG TERM GOALS: Target date: 04/19/2022   Pt will increase FOTO to at least 57 to demonstrate significant improvement in function at home and work related to back pain  Baseline: 02/28/22:  41  Goal status: INITIAL   2.  Pt will decrease worst back pain by at least 2 points on the NPRS in order to demonstrate clinically significant reduction in back pain. Baseline: 02/28/22:  10/10  Goal status: INITIAL   3.  Pt will increase in strength of tested LE musculature for  ability to tolerate standing demands of job and ability to ambulate for longer durations that are necessary with grocery shopping. Baseline: 02/28/22:  grossly 3-4/5 (see table above) Goal status: INITIAL   4.  Pt will independently negotiate stairs equivalent to entering to show independence with accessing son's home.       Baseline: 02/28/22:   difficulty with negotiating home steps.       Goal status: INITIAL     PLAN: PT FREQUENCY: 1-2x/week   PT DURATION: 8 weeks   PLANNED INTERVENTIONS: Therapeutic exercises, Therapeutic activity, Neuromuscular re-education, Balance training, Gait training, Patient/Family education, Self Care, Joint mobilization, Joint manipulation, Orthotic/Fit training, DME instructions, Dry Needling, Electrical stimulation, Spinal manipulation, Spinal mobilization, Cryotherapy, Moist heat, Taping, Traction, Manual therapy, and Re-evaluation.   PLAN FOR NEXT SESSION: Continue with right LE nerve glides, and strength of quads and hip flexors. Traction as needed for pain relief. Restoration of R hip/knee ROM. Manual therapy and soft tissue work for low back prn.      Valentina Gu, PT, DPT #[id number]  Eilleen Kempf 03/21/2022 7:43 AM

## 2022-03-23 ENCOUNTER — Ambulatory Visit: Payer: Medicaid Other | Attending: Family Medicine | Admitting: Physical Therapy

## 2022-03-23 DIAGNOSIS — M5459 Other low back pain: Secondary | ICD-10-CM | POA: Insufficient documentation

## 2022-03-23 DIAGNOSIS — M2560 Stiffness of unspecified joint, not elsewhere classified: Secondary | ICD-10-CM | POA: Insufficient documentation

## 2022-03-23 DIAGNOSIS — M6281 Muscle weakness (generalized): Secondary | ICD-10-CM | POA: Insufficient documentation

## 2022-03-23 NOTE — Therapy (Deleted)
OUTPATIENT PHYSICAL THERAPY TREATMENT NOTE   Patient Name: Claudia Mills MRN: [id number] DOB:[birth year], 52 y.o., female Today's Date: 03/14/2022   END OF SESSION:       Past Medical History:  Diagnosis Date   Diabetes mellitus without complication ([place])    MI (myocardial infarction) ([place])    Past Surgical History:  Procedure Laterality Date   CORONARY ANGIOPLASTY WITH STENT PLACEMENT     There are no problems to display for this patient.   PCP: [facility] [place] REFERRING PROVIDER: Donnie Coffin, MD   REFERRING DIAG:  [phone] (ICD-10-CM) - Pain in right knee  M79.604 (ICD-10-CM) - Pain in right leg  M54.50 (ICD-10-CM) - Low back pain, unspecified    THERAPY DIAG:  Other low back pain  Muscle weakness (generalized)  Joint stiffness of spine  Rationale for Evaluation and Treatment Rehabilitation  PERTINENT HISTORY: Patient reports right LE giving out while walking longer distances in [place] and prefers to use shopping cart as a way to keep balance. Patient states that using the steps is difficult. Patient states that they prefer to use the a shopping cart for balance. Has some numbness and tingling in the lower leg, long distances make it worse and a hot rag and CBD lotion make it feel better. 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 Goals: decrease pain and get stronger. And stepping up the steps. Wakes up in the middle in the night with aching and is taking Gabapentin and states that helps for a short time and then will wake up in the middle of the night. Standing work with folding socks and struggles walking to the bathroom. Lost a good amount of weight recently and believes it to be due to diabetes, switch in medications, and poor nutrition.   PAIN:    Pain Intensity: Present: 0/10, Best: 0/10, Worst: 10/10 Pain location: low back with referring to anterior right thigh Pain Quality:  intermittent, burning, and aching  Radiating: Yes  Numbness/Tingling: Yes, R anterior thigh Focal Weakness: Yes right LE weakness with intermittent buckling  Aggravating factors: walking long distances in [employer] and trying to climb the stairs Relieving factors: Rest and heat 24-hour pain behavior: increases with longer activities. Will wake up from sleep with  History of prior back injury, pain, surgery, or therapy: Yes   Imaging: Yes ,   Negative DVT screen   Radiograph of R femur (09/20/21): Negative   L-spine CT Scan 04/07/21 IMPRESSION: 1. L2 compression fracture with 25% height loss, likely recent given mild paravertebral soft tissue edema/hematoma. 2. Multilevel disc degeneration, most severe at L4-5 where there is severe spinal stenosis and severe right neural foraminal stenosis. 3. Moderate spinal stenosis at L3-4. 4. Asymmetric left lateral recess stenosis and moderate bilateral neural foraminal stenosis at L5-S1.     Red flags: Negative for bowel/bladder changes, saddle paresthesia, personal history of cancer, h/o spinal tumors, h/o compression fx, h/o abdominal aneurysm, abdominal pain, chills/fever, night sweats, nausea, vomiting, unrelenting pain, first onset of insidious LBP <20 y/o             -history of nocturnal pain and weight loss (explained with poor nutrition)     PRECAUTIONS: None   WEIGHT BEARING RESTRICTIONS: No   FALLS: Has patient fallen in last 6 months? Yes. 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       PRECAUTIONS: Fall history    SUBJECTIVE:                                                                                                                                                                                      SUBJECTIVE STATEMENT:  Patient reports partial compliance with HEP e.g. prone on elbows. Patient reports she has poor HEP compliance. Patient reports pain in her lower lumbar region. Patient reports pain in her leg is better. Pt is continuing with Gabapentin for neuropathy. Pt feels that duloxetine is helping too. Pt feels that her R knee is improving.    PAIN:  Are you having pain? Yes: NPRS scale: 1-2/10 Pain location: L-sided low back pain     OBJECTIVE: (objective measures completed at initial evaluation unless otherwise dated)  Patient Surveys  FOTO currently 57 with a prediction of 65   Cognition Patient is oriented to person, place, and time.  Recent memory is intact.  Remote memory is intact.  Attention span and concentration are intact.  Expressive speech is intact.  Patient's fund of knowledge is within normal limits for educational level.                          Gross Musculoskeletal Assessment Tremor: None Bulk: Normal Tone: Normal No visible step-off along spinal column, no signs of scoliosis     GAIT: Comments: Antalgic gait on right LE, decreased terminal knee extension at terminal swing RLE, decreased RLE weight shift; forward flexed posture maintained throughout gait cycle    Posture: Lumbar lordosis: Decreased Increased thoracic kyphosis Forward head, rounded shoulders   AROM     AROM (Normal range in degrees) AROM   Lumbar    Flexion (65)  Mod motion loss *  Extension (30) Mod motion loss *  Right lateral flexion (25) Min motion loss *  Left lateral flexion (25) Min motion loss   Right rotation (30) WNL  Left rotation (30) WNL                 Hip Right Left  Flexion (125) 90* 100  Extension (15)      Abduction (40)      Adduction       Internal Rotation (45)      External Rotation (45)             Knee      Flexion (135) 85 120  Extension (0) +3 +4         Ankle      Dorsiflexion (20)      Plantarflexion (50)      Inversion (35)      Eversion (15)      (* = pain; Blank rows = not tested)     PROM Hip flexion: R 60, L 105 Knee flexion: R 85, L 145     LE MMT: MMT (out of 5) Right   Left    Hip flexion 3 3  Hip extension      Hip abduction 4 4  Hip adduction 4 4  Hip internal rotation      Hip external rotation      Knee flexion 4 4  Knee extension 3 3  Ankle dorsiflexion 4 5  Ankle plantarflexion      Ankle inversion 5 5  Ankle eversion      (* = pain; Blank rows = not tested)   Sensation Deferred   Reflexes Deferred   Muscle Length Hamstrings: R: Not examined L: Not examined Ely (quadriceps): R: Positive for back pain L: Not examined Thomas (hip flexors): R: Not examined L: Not examined Ober: R: Not examined L: Not examined   Palpation Location Right Left         Lumbar paraspinals      Quadratus Lumborum      Gluteus Maximus 1 0  Gluteus Medius 1 0  Deep hip external rotators 2 0  PSIS      Fortin's Area (SIJ)      Adductors 2    Hamstrings 1    Quadriceps  2    Gastrocnemius/soleus 2    (Blank rows = not tested) Graded on 0-4 scale (0 = no pain, 1 = pain, 2 = pain with wincing/grimacing/flinching, 3 = pain with withdrawal, 4 = unwilling to allow palpation)   Passive Accessory Intervertebral Motion Spasm end-feel with L3-5 CPA. Empty end-feel L1-2, pain prior to restriction.      Special Tests Lumbar Radiculopathy and Discogenic: Centralization and Peripheralization (SN 92, -LR 0.12): Negative Slump (SN 83, -LR 0.32): R: Positive L: Negative SLR (SN 92, -LR 0.29): R: Positive L:  Negative     Facet  Joint: Extension-Rotation (SN 100, -LR 0.0): R: Not examined L: Not examined   Lumbar Foraminal Stenosis: Lumbar quadrant (SN 70): R: Not examined L: Not examined   Hip: FABER (SN 81): R: Positive L: Not examined FADIR (SN 94): R: Negative L: Not examined Hip scour (SN 50): R: Positive for back pain L: Not examined   SIJ:  Thigh Thrust (SN 88, -LR 0.18) : R: Not examined L: Not examined  Piriformis Syndrome: FAIR Test (SN 88, SP 83): R: Not examined L: Not examined        TODAY'S TREATMENT: DATE: 03/14/2022       Manual Therapy - for symptom modulation, soft tissue sensitivity and mobility, joint mobility, ROM   STM/DTM L L3-L5 erector spinae, L gluteus maximus/medius; x 10 minutes  Bilateral long-leg distraction with Mulligan belt, therapist at foot of table; 10 sec on, 5 sec off; x 5 minutes  Therapeutic Exercise - for improved soft tissue flexibility and extensibility as needed for ROM, dural/neural mobility as needed for lumbopelvic ROM, functional LE strength needed for transfers and stair negotiation  Femoral nerve glides, prone; 2x10 - for HEP review  -therapist demonstration and verbal cueing for degree of stretch/tension at end-range Supine piriformis stretch (figure-4, pull); 3x30 sec Lower trunk rotations, hooklying; x10 ea dir  -Pt demonstrates full knee flexion and extension AROM today   Sit to stand; 2x10, on edge of table      PATIENT EDUCATION:  Education details: see above for patient education details Person educated: Patient Education method: Explanation, Demonstration, and Handouts Education comprehension: verbalized understanding     HOME EXERCISE PROGRAM:   Access Code: [facility] URL: [email or web address] Date: 02/28/2022 Prepared by: Valentina Gu   Exercises - Prone on Elbows Stretch  - 3-4 x daily - 7 x weekly - 3-43mn hold - Prone Femoral Nerve Mobilization  - 2 x daily - 7 x weekly - 2 sets - 10 reps - 1sec  hold - Supine Heel Slide with Strap  - 2 x daily - 7 x weekly - 2 sets - 10 reps - 3sec hold     ASSESSMENT:   CLINICAL IMPRESSION: Patient does have significantly lower NPRS today versus that reported at initial evaluation. She reports improving R knee pain and she exhibits improving R knee flexion AROM tolerated. Pt is able to bear weight onto RLE better and she has lessening c/o RLE pain/paresthesias. Pt tolerates modest progression of ROM and weightbearing exercise well without notable c/o pain. Patient currently presents with decreased LE strength, decreased ROM of R hip and knee, decreased dural/neural mobility for anterior/posterior LE chain, anterior thigh hypersensitivity and taut/tender R gluteal musculature and R>L lumbar paraspinals, activity limitations in prolonged walking and stair negotiation. Patient will benefit from skilled PT to address the noted deficits as needed for best return to PLOF.   OBJECTIVE IMPAIRMENTS: decreased activity tolerance, decreased ROM, and decreased strength.    ACTIVITY LIMITATIONS:  walking far distances and using the steps.   PARTICIPATION LIMITATIONS: shopping and occupation   PERSONAL FACTORS: Fitness, Past/current experiences, Social background, and 1 comorbidity: diabetes  are also affecting patient's functional outcome.    REHAB POTENTIAL: Good   CLINICAL DECISION MAKING: Evolving/moderate complexity   EVALUATION COMPLEXITY: Moderate     GOALS: Goals reviewed with patient? No   SHORT TERM GOALS: Target date: 03/21/2022   Pt will be independent with HEP in order to improve strength and decrease back pain to improve pain-free function at home and work. Baseline: 02/28/22: pt HEP was reviewed for carryover effect.  Goal status: INITIAL   2.  Pt will increase in right knee flexion AROM up to 120 degrees for ability to complete transferring from surfaces of different heights, bathing, dressing, etc.      Baseline: 02/28/22: +3 - 85 degree  R knee AROM      Goal status: INITIAL     LONG TERM GOALS: Target date: 04/19/2022   Pt will increase FOTO to at least 57 to demonstrate significant improvement in function at home and work related to back pain  Baseline: 02/28/22:  41  Goal status: INITIAL   2.  Pt will decrease worst back pain by at least 2 points on the NPRS in order to demonstrate clinically significant reduction in back pain. Baseline: 02/28/22:  10/10  Goal status: INITIAL   3.  Pt will increase in strength of tested LE musculature for  ability to tolerate standing demands of job and ability to ambulate for longer durations that are necessary with grocery shopping. Baseline: 02/28/22:  grossly 3-4/5 (see table above) Goal status: INITIAL   4.  Pt will independently negotiate stairs equivalent to entering to show independence with accessing son's home.       Baseline: 02/28/22:   difficulty with negotiating home steps.       Goal status: INITIAL     PLAN: PT FREQUENCY: 1-2x/week   PT DURATION: 8 weeks   PLANNED INTERVENTIONS: Therapeutic exercises, Therapeutic activity, Neuromuscular re-education, Balance training, Gait training, Patient/Family education, Self Care, Joint mobilization, Joint manipulation, Orthotic/Fit training, DME instructions, Dry Needling, Electrical stimulation, Spinal manipulation, Spinal mobilization, Cryotherapy, Moist heat, Taping, Traction, Manual therapy, and Re-evaluation.   PLAN FOR NEXT SESSION: Continue with right LE nerve glides, and strength of quads and hip flexors. Traction as needed for pain relief. Restoration of R hip/knee ROM. Manual therapy and soft tissue work for low back prn.      Valentina Gu, PT, DPT #[id number]  Eilleen Kempf 03/23/2022 7:51 AM

## 2022-03-28 ENCOUNTER — Ambulatory Visit: Payer: Medicaid Other | Admitting: Physical Therapy

## 2022-03-28 NOTE — Therapy (Deleted)
OUTPATIENT PHYSICAL THERAPY TREATMENT NOTE   Patient Name: Claudia Mills MRN: [id number] DOB:[birth year], 52 y.o., female Today's Date: 03/14/2022   END OF SESSION:       Past Medical History:  Diagnosis Date   Diabetes mellitus without complication ([place])    MI (myocardial infarction) ([place])    Past Surgical History:  Procedure Laterality Date   CORONARY ANGIOPLASTY WITH STENT PLACEMENT     There are no problems to display for this patient.   PCP: [facility] REFERRING PROVIDER: Donnie Coffin, MD   REFERRING DIAG:  [phone] (ICD-10-CM) - Pain in right knee  M79.604 (ICD-10-CM) - Pain in right leg  M54.50 (ICD-10-CM) - Low back pain, unspecified    THERAPY DIAG:  Other low back pain  Muscle weakness (generalized)  Joint stiffness of spine  Rationale for Evaluation and Treatment Rehabilitation  PERTINENT HISTORY: Patient reports right LE giving out while walking longer distances in [place] and prefers to use shopping cart as a way to keep balance. Patient states that using the steps is difficult. Patient states that they prefer to use the a shopping cart for balance. Has some numbness and tingling in the lower leg, long distances make it worse and a hot rag and CBD lotion make it feel better. 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 Goals: decrease pain and get stronger. And stepping up the steps. Wakes up in the middle in the night with aching and is taking Gabapentin and states that helps for a short time and then will wake up in the middle of the night. Standing work with folding socks and struggles walking to the bathroom. Lost a good amount of weight recently and believes it to be due to diabetes, switch in medications, and poor nutrition.   PAIN:    Pain Intensity: Present: 0/10, Best: 0/10, Worst: 10/10 Pain location: low back with referring to anterior right thigh Pain Quality:  intermittent, burning, and aching  Radiating: Yes  Numbness/Tingling: Yes, R anterior thigh Focal Weakness: Yes right LE weakness with intermittent buckling  Aggravating factors: walking long distances in [employer] and trying to climb the stairs Relieving factors: Rest and heat 24-hour pain behavior: increases with longer activities. Will wake up from sleep with  History of prior back injury, pain, surgery, or therapy: Yes   Imaging: Yes ,   Negative DVT screen   Radiograph of R femur (09/20/21): Negative   L-spine CT Scan 04/07/21 IMPRESSION: 1. L2 compression fracture with 25% height loss, likely recent given mild paravertebral soft tissue edema/hematoma. 2. Multilevel disc degeneration, most severe at L4-5 where there is severe spinal stenosis and severe right neural foraminal stenosis. 3. Moderate spinal stenosis at L3-4. 4. Asymmetric left lateral recess stenosis and moderate bilateral neural foraminal stenosis at L5-S1.     Red flags: Negative for bowel/bladder changes, saddle paresthesia, personal history of cancer, h/o spinal tumors, h/o compression fx, h/o abdominal aneurysm, abdominal pain, chills/fever, night sweats, nausea, vomiting, unrelenting pain, first onset of insidious LBP <20 y/o             -history of nocturnal pain and weight loss (explained with poor nutrition)     PRECAUTIONS: None   WEIGHT BEARING RESTRICTIONS: No   FALLS: Has patient fallen in last 6 months? Yes. 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       PRECAUTIONS: Fall history    SUBJECTIVE:                                                                                                                                                                                      SUBJECTIVE STATEMENT:  Patient reports partial compliance with HEP e.g. prone on elbows. Patient reports she has poor HEP compliance. Patient reports pain in her lower lumbar region. Patient reports pain in her leg is better. Pt is continuing with Gabapentin for neuropathy. Pt feels that duloxetine is helping too. Pt feels that her R knee is improving.    PAIN:  Are you having pain? Yes: NPRS scale: 1-2/10 Pain location: L-sided low back pain     OBJECTIVE: (objective measures completed at initial evaluation unless otherwise dated)  Patient Surveys  FOTO currently 71 with a prediction of 44   Cognition Patient is oriented to person, place, and time.  Recent memory is intact.  Remote memory is intact.  Attention span and concentration are intact.  Expressive speech is intact.  Patient's fund of knowledge is within normal limits for educational level.                          Gross Musculoskeletal Assessment Tremor: None Bulk: Normal Tone: Normal No visible step-off along spinal column, no signs of scoliosis     GAIT: Comments: Antalgic gait on right LE, decreased terminal knee extension at terminal swing RLE, decreased RLE weight shift; forward flexed posture maintained throughout gait cycle    Posture: Lumbar lordosis: Decreased Increased thoracic kyphosis Forward head, rounded shoulders   AROM     AROM (Normal range in degrees) AROM   Lumbar    Flexion (65)  Mod motion loss *  Extension (30) Mod motion loss *  Right lateral flexion (25) Min motion loss *  Left lateral flexion (25) Min motion loss   Right rotation (30) WNL  Left rotation (30) WNL                 Hip Right Left  Flexion (125) 90* 100  Extension (15)      Abduction (40)      Adduction       Internal Rotation (45)      External Rotation (45)             Knee      Flexion (135) 85 120  Extension (0) +3 +4         Ankle      Dorsiflexion (20)      Plantarflexion (50)      Inversion (35)      Eversion (15)      (* = pain; Blank rows = not tested)     PROM Hip flexion: R 60, L 105 Knee flexion: R 85, L 145     LE MMT: MMT (out of 5) Right   Left    Hip flexion 3 3  Hip extension      Hip abduction 4 4  Hip adduction 4 4  Hip internal rotation      Hip external rotation      Knee flexion 4 4  Knee extension 3 3  Ankle dorsiflexion 4 5  Ankle plantarflexion      Ankle inversion 5 5  Ankle eversion      (* = pain; Blank rows = not tested)   Sensation Deferred   Reflexes Deferred   Muscle Length Hamstrings: R: Not examined L: Not examined Ely (quadriceps): R: Positive for back pain L: Not examined Thomas (hip flexors): R: Not examined L: Not examined Ober: R: Not examined L: Not examined   Palpation Location Right Left         Lumbar paraspinals      Quadratus Lumborum      Gluteus Maximus 1 0  Gluteus Medius 1 0  Deep hip external rotators 2 0  PSIS      Fortin's Area (SIJ)      Adductors 2    Hamstrings 1    Quadriceps  2    Gastrocnemius/soleus 2    (Blank rows = not tested) Graded on 0-4 scale (0 = no pain, 1 = pain, 2 = pain with wincing/grimacing/flinching, 3 = pain with withdrawal, 4 = unwilling to allow palpation)   Passive Accessory Intervertebral Motion Spasm end-feel with L3-5 CPA. Empty end-feel L1-2, pain prior to restriction.      Special Tests Lumbar Radiculopathy and Discogenic: Centralization and Peripheralization (SN 92, -LR 0.12): Negative Slump (SN 83, -LR 0.32): R: Positive L: Negative SLR (SN 92, -LR 0.29): R: Positive L:  Negative     Facet  Joint: Extension-Rotation (SN 100, -LR 0.0): R: Not examined L: Not examined   Lumbar Foraminal Stenosis: Lumbar quadrant (SN 70): R: Not examined L: Not examined   Hip: FABER (SN 81): R: Positive L: Not examined FADIR (SN 94): R: Negative L: Not examined Hip scour (SN 50): R: Positive for back pain L: Not examined   SIJ:  Thigh Thrust (SN 88, -LR 0.18) : R: Not examined L: Not examined  Piriformis Syndrome: FAIR Test (SN 88, SP 83): R: Not examined L: Not examined        TODAY'S TREATMENT: DATE: 03/14/2022       Manual Therapy - for symptom modulation, soft tissue sensitivity and mobility, joint mobility, ROM   STM/DTM L L3-L5 erector spinae, L gluteus maximus/medius; x 10 minutes  Bilateral long-leg distraction with Mulligan belt, therapist at foot of table; 10 sec on, 5 sec off; x 5 minutes  Therapeutic Exercise - for improved soft tissue flexibility and extensibility as needed for ROM, dural/neural mobility as needed for lumbopelvic ROM, functional LE strength needed for transfers and stair negotiation  Femoral nerve glides, prone; 2x10 - for HEP review  -therapist demonstration and verbal cueing for degree of stretch/tension at end-range Supine piriformis stretch (figure-4, pull); 3x30 sec Lower trunk rotations, hooklying; x10 ea dir  -Pt demonstrates full knee flexion and extension AROM today   Sit to stand; 2x10, on edge of table      PATIENT EDUCATION:  Education details: see above for patient education details Person educated: Patient Education method: Explanation, Demonstration, and Handouts Education comprehension: verbalized understanding     HOME EXERCISE PROGRAM:   Access Code: [facility] URL: [email or web address] Date: 02/28/2022 Prepared by: Valentina Gu   Exercises - Prone on Elbows Stretch  - 3-4 x daily - 7 x weekly - 3-43mn hold - Prone Femoral Nerve Mobilization  - 2 x daily - 7 x weekly - 2 sets - 10 reps - 1sec  hold - Supine Heel Slide with Strap  - 2 x daily - 7 x weekly - 2 sets - 10 reps - 3sec hold     ASSESSMENT:   CLINICAL IMPRESSION: Patient does have significantly lower NPRS today versus that reported at initial evaluation. She reports improving R knee pain and she exhibits improving R knee flexion AROM tolerated. Pt is able to bear weight onto RLE better and she has lessening c/o RLE pain/paresthesias. Pt tolerates modest progression of ROM and weightbearing exercise well without notable c/o pain. Patient currently presents with decreased LE strength, decreased ROM of R hip and knee, decreased dural/neural mobility for anterior/posterior LE chain, anterior thigh hypersensitivity and taut/tender R gluteal musculature and R>L lumbar paraspinals, activity limitations in prolonged walking and stair negotiation. Patient will benefit from skilled PT to address the noted deficits as needed for best return to PLOF.   OBJECTIVE IMPAIRMENTS: decreased activity tolerance, decreased ROM, and decreased strength.    ACTIVITY LIMITATIONS:  walking far distances and using the steps.   PARTICIPATION LIMITATIONS: shopping and occupation   PERSONAL FACTORS: Fitness, Past/current experiences, Social background, and 1 comorbidity: diabetes  are also affecting patient's functional outcome.    REHAB POTENTIAL: Good   CLINICAL DECISION MAKING: Evolving/moderate complexity   EVALUATION COMPLEXITY: Moderate     GOALS: Goals reviewed with patient? No   SHORT TERM GOALS: Target date: 03/21/2022   Pt will be independent with HEP in order to improve strength and decrease back pain to improve pain-free function at home and work. Baseline: 02/28/22: pt HEP was reviewed for carryover effect.  Goal status: INITIAL   2.  Pt will increase in right knee flexion AROM up to 120 degrees for ability to complete transferring from surfaces of different heights, bathing, dressing, etc.      Baseline: 02/28/22: +3 - 85 degree  R knee AROM      Goal status: INITIAL     LONG TERM GOALS: Target date: 04/19/2022   Pt will increase FOTO to at least 57 to demonstrate significant improvement in function at home and work related to back pain  Baseline: 02/28/22:  41  Goal status: INITIAL   2.  Pt will decrease worst back pain by at least 2 points on the NPRS in order to demonstrate clinically significant reduction in back pain. Baseline: 02/28/22:  10/10  Goal status: INITIAL   3.  Pt will increase in strength of tested LE musculature for  ability to tolerate standing demands of job and ability to ambulate for longer durations that are necessary with grocery shopping. Baseline: 02/28/22:  grossly 3-4/5 (see table above) Goal status: INITIAL   4.  Pt will independently negotiate stairs equivalent to entering to show independence with accessing son's home.       Baseline: 02/28/22:   difficulty with negotiating home steps.       Goal status: INITIAL     PLAN: PT FREQUENCY: 1-2x/week   PT DURATION: 8 weeks   PLANNED INTERVENTIONS: Therapeutic exercises, Therapeutic activity, Neuromuscular re-education, Balance training, Gait training, Patient/Family education, Self Care, Joint mobilization, Joint manipulation, Orthotic/Fit training, DME instructions, Dry Needling, Electrical stimulation, Spinal manipulation, Spinal mobilization, Cryotherapy, Moist heat, Taping, Traction, Manual therapy, and Re-evaluation.   PLAN FOR NEXT SESSION: Continue with right LE nerve glides, and strength of quads and hip flexors. Traction as needed for pain relief. Restoration of R hip/knee ROM. Manual therapy and soft tissue work for low back prn.      Valentina Gu, PT, DPT [phone]  Eilleen Kempf 03/28/2022 8:31 AM

## 2022-03-30 ENCOUNTER — Ambulatory Visit: Payer: Medicaid Other | Admitting: Physical Therapy

## 2022-03-30 NOTE — Therapy (Deleted)
OUTPATIENT PHYSICAL THERAPY TREATMENT NOTE   Patient Name: Claudia Mills MRN: [id number] DOB:05-11-1970, 52 y.o., female Today's Date: 03/30/2022   END OF SESSION:       Past Medical History:  Diagnosis Date   Diabetes mellitus without complication ([place])    MI (myocardial infarction) ([place])    Past Surgical History:  Procedure Laterality Date   CORONARY ANGIOPLASTY WITH STENT PLACEMENT     There are no problems to display for this patient.   PCP: [facility] [place] REFERRING PROVIDER: Donnie Coffin, MD   REFERRING DIAG:  9416794869 (ICD-10-CM) - Pain in right knee  M79.604 (ICD-10-CM) - Pain in right leg  M54.50 (ICD-10-CM) - Low back pain, unspecified    THERAPY DIAG:  Other low back pain  Muscle weakness (generalized)  Joint stiffness of spine  Rationale for Evaluation and Treatment Rehabilitation  PERTINENT HISTORY: Patient reports right LE giving out while walking longer distances in [place] and prefers to use shopping cart as a way to keep balance. Patient states that using the steps is difficult. Patient states that they prefer to use the a shopping cart for balance. Has some numbness and tingling in the lower leg, long distances make it worse and a hot rag and CBD lotion make it feel better. 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 Goals: decrease pain and get stronger. And stepping up the steps. Wakes up in the middle in the night with aching and is taking Gabapentin and states that helps for a short time and then will wake up in the middle of the night. Standing work with folding socks and struggles walking to the bathroom. Lost a good amount of weight recently and believes it to be due to diabetes, switch in medications, and poor nutrition.   PAIN:    Pain Intensity: Present: 0/10, Best: 0/10, Worst: 10/10 Pain location: low back with referring to anterior right thigh Pain Quality:  intermittent, burning, and aching  Radiating: Yes  Numbness/Tingling: Yes, R anterior thigh Focal Weakness: Yes right LE weakness with intermittent buckling  Aggravating factors: walking long distances in [employer] and trying to climb the stairs Relieving factors: Rest and heat 24-hour pain behavior: increases with longer activities. Will wake up from sleep with  History of prior back injury, pain, surgery, or therapy: Yes   Imaging: Yes ,   Negative DVT screen   Radiograph of R femur (09/20/21): Negative   L-spine CT Scan 04/07/21 IMPRESSION: 1. L2 compression fracture with 25% height loss, likely recent given mild paravertebral soft tissue edema/hematoma. 2. Multilevel disc degeneration, most severe at L4-5 where there is severe spinal stenosis and severe right neural foraminal stenosis. 3. Moderate spinal stenosis at L3-4. 4. Asymmetric left lateral recess stenosis and moderate bilateral neural foraminal stenosis at L5-S1.     Red flags: Negative for bowel/bladder changes, saddle paresthesia, personal history of cancer, h/o spinal tumors, h/o compression fx, h/o abdominal aneurysm, abdominal pain, chills/fever, night sweats, nausea, vomiting, unrelenting pain, first onset of insidious LBP <20 y/o             -history of nocturnal pain and weight loss (explained with poor nutrition)     PRECAUTIONS: None   WEIGHT BEARING RESTRICTIONS: No   FALLS: Has patient fallen in last 6 months? Yes. 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       PRECAUTIONS: Fall history    SUBJECTIVE:                                                                                                                                                                                      SUBJECTIVE STATEMENT:  Patient reports partial compliance with HEP e.g. prone on elbows. Patient reports she has poor HEP compliance. Patient reports pain in her lower lumbar region. Patient reports pain in her leg is better. Pt is continuing with Gabapentin for neuropathy. Pt feels that duloxetine is helping too. Pt feels that her R knee is improving.    PAIN:  Are you having pain? Yes: NPRS scale: 1-2/10 Pain location: L-sided low back pain     OBJECTIVE: (objective measures completed at initial evaluation unless otherwise dated)  Patient Surveys  FOTO currently 57 with a prediction of 65   Cognition Patient is oriented to person, place, and time.  Recent memory is intact.  Remote memory is intact.  Attention span and concentration are intact.  Expressive speech is intact.  Patient's fund of knowledge is within normal limits for educational level.                          Gross Musculoskeletal Assessment Tremor: None Bulk: Normal Tone: Normal No visible step-off along spinal column, no signs of scoliosis     GAIT: Comments: Antalgic gait on right LE, decreased terminal knee extension at terminal swing RLE, decreased RLE weight shift; forward flexed posture maintained throughout gait cycle    Posture: Lumbar lordosis: Decreased Increased thoracic kyphosis Forward head, rounded shoulders   AROM     AROM (Normal range in degrees) AROM   Lumbar    Flexion (65)  Mod motion loss *  Extension (30) Mod motion loss *  Right lateral flexion (25) Min motion loss *  Left lateral flexion (25) Min motion loss   Right rotation (30) WNL  Left rotation (30) WNL                 Hip Right Left  Flexion (125) 90* 100  Extension (15)      Abduction (40)      Adduction       Internal Rotation (45)      External Rotation (45)             Knee      Flexion (135) 85 120  Extension (0) +3 +4         Ankle      Dorsiflexion (20)      Plantarflexion (50)      Inversion (35)      Eversion (15)      (* = pain; Blank rows = not tested)     PROM Hip flexion: R 60, L 105 Knee flexion: R 85, L 145     LE MMT: MMT (out of 5) Right   Left    Hip flexion 3 3  Hip extension      Hip abduction 4 4  Hip adduction 4 4  Hip internal rotation      Hip external rotation      Knee flexion 4 4  Knee extension 3 3  Ankle dorsiflexion 4 5  Ankle plantarflexion      Ankle inversion 5 5  Ankle eversion      (* = pain; Blank rows = not tested)   Sensation Deferred   Reflexes Deferred   Muscle Length Hamstrings: R: Not examined L: Not examined Ely (quadriceps): R: Positive for back pain L: Not examined Thomas (hip flexors): R: Not examined L: Not examined Ober: R: Not examined L: Not examined   Palpation Location Right Left         Lumbar paraspinals      Quadratus Lumborum      Gluteus Maximus 1 0  Gluteus Medius 1 0  Deep hip external rotators 2 0  PSIS      Fortin's Area (SIJ)      Adductors 2    Hamstrings 1    Quadriceps  2    Gastrocnemius/soleus 2    (Blank rows = not tested) Graded on 0-4 scale (0 = no pain, 1 = pain, 2 = pain with wincing/grimacing/flinching, 3 = pain with withdrawal, 4 = unwilling to allow palpation)   Passive Accessory Intervertebral Motion Spasm end-feel with L3-5 CPA. Empty end-feel L1-2, pain prior to restriction.      Special Tests Lumbar Radiculopathy and Discogenic: Centralization and Peripheralization (SN 92, -LR 0.12): Negative Slump (SN 83, -LR 0.32): R: Positive L: Negative SLR (SN 92, -LR 0.29): R: Positive L:  Negative     Facet  Joint: Extension-Rotation (SN 100, -LR 0.0): R: Not examined L: Not examined   Lumbar Foraminal Stenosis: Lumbar quadrant (SN 70): R: Not examined L: Not examined   Hip: FABER (SN 81): R: Positive L: Not examined FADIR (SN 94): R: Negative L: Not examined Hip scour (SN 50): R: Positive for back pain L: Not examined   SIJ:  Thigh Thrust (SN 88, -LR 0.18) : R: Not examined L: Not examined  Piriformis Syndrome: FAIR Test (SN 88, SP 83): R: Not examined L: Not examined        TODAY'S TREATMENT: DATE: 03/30/2022      Manual Therapy - for symptom modulation, soft tissue sensitivity and mobility, joint mobility, ROM   STM/DTM L L3-L5 erector spinae, L gluteus maximus/medius; x 10 minutes  Bilateral long-leg distraction with Mulligan belt, therapist at foot of table; 10 sec on, 5 sec off; x 5 minutes  Therapeutic Exercise - for improved soft tissue flexibility and extensibility as needed for ROM, dural/neural mobility as needed for lumbopelvic ROM, functional LE strength needed for transfers and stair negotiation  Femoral nerve glides, prone; 2x10 - for HEP review  -therapist demonstration and verbal cueing for degree of stretch/tension at end-range Supine piriformis stretch (figure-4, pull); 3x30 sec Lower trunk rotations, hooklying; x10 ea dir  -Pt demonstrates full knee flexion and extension AROM today   Sit to stand; 2x10, on edge of table      PATIENT EDUCATION:  Education details: see above for patient education details Person educated: Patient Education method: Explanation, Demonstration, and Handouts Education comprehension: verbalized understanding     HOME EXERCISE PROGRAM:   Access Code: [facility] URL: [email or web address] Date: 02/28/2022 Prepared by: Valentina Gu   Exercises - Prone on Elbows Stretch  - 3-4 x daily - 7 x weekly - 3-54mn hold - Prone Femoral Nerve Mobilization  - 2 x daily - 7 x weekly - 2 sets - 10 reps - 1sec  hold - Supine Heel Slide with Strap  - 2 x daily - 7 x weekly - 2 sets - 10 reps - 3sec hold     ASSESSMENT:   CLINICAL IMPRESSION: Patient does have significantly lower NPRS today versus that reported at initial evaluation. She reports improving R knee pain and she exhibits improving R knee flexion AROM tolerated. Pt is able to bear weight onto RLE better and she has lessening c/o RLE pain/paresthesias. Pt tolerates modest progression of ROM and weightbearing exercise well without notable c/o pain. Patient currently presents with decreased LE strength, decreased ROM of R hip and knee, decreased dural/neural mobility for anterior/posterior LE chain, anterior thigh hypersensitivity and taut/tender R gluteal musculature and R>L lumbar paraspinals, activity limitations in prolonged walking and stair negotiation. Patient will benefit from skilled PT to address the noted deficits as needed for best return to PLOF.   OBJECTIVE IMPAIRMENTS: decreased activity tolerance, decreased ROM, and decreased strength.    ACTIVITY LIMITATIONS:  walking far distances and using the steps.   PARTICIPATION LIMITATIONS: shopping and occupation   PERSONAL FACTORS: Fitness, Past/current experiences, Social background, and 1 comorbidity: diabetes  are also affecting patient's functional outcome.    REHAB POTENTIAL: Good   CLINICAL DECISION MAKING: Evolving/moderate complexity   EVALUATION COMPLEXITY: Moderate     GOALS: Goals reviewed with patient? No   SHORT TERM GOALS: Target date: 03/21/2022   Pt will be independent with HEP in order to improve strength and decrease back pain to improve pain-free function at home and work. Baseline: 02/28/22: pt HEP was reviewed for carryover effect.  Goal status: INITIAL   2.  Pt will increase in right knee flexion AROM up to 120 degrees for ability to complete transferring from surfaces of different heights, bathing, dressing, etc.      Baseline: 02/28/22: +3 - 85 degree  R knee AROM      Goal status: INITIAL     LONG TERM GOALS: Target date: 04/19/2022   Pt will increase FOTO to at least 57 to demonstrate significant improvement in function at home and work related to back pain  Baseline: 02/28/22:  41  Goal status: INITIAL   2.  Pt will decrease worst back pain by at least 2 points on the NPRS in order to demonstrate clinically significant reduction in back pain. Baseline: 02/28/22:  10/10  Goal status: INITIAL   3.  Pt will increase in strength of tested LE musculature for  ability to tolerate standing demands of job and ability to ambulate for longer durations that are necessary with grocery shopping. Baseline: 02/28/22:  grossly 3-4/5 (see table above) Goal status: INITIAL   4.  Pt will independently negotiate stairs equivalent to entering to show independence with accessing son's home.       Baseline: 02/28/22:   difficulty with negotiating home steps.       Goal status: INITIAL     PLAN: PT FREQUENCY: 1-2x/week   PT DURATION: 8 weeks   PLANNED INTERVENTIONS: Therapeutic exercises, Therapeutic activity, Neuromuscular re-education, Balance training, Gait training, Patient/Family education, Self Care, Joint mobilization, Joint manipulation, Orthotic/Fit training, DME instructions, Dry Needling, Electrical stimulation, Spinal manipulation, Spinal mobilization, Cryotherapy, Moist heat, Taping, Traction, Manual therapy, and Re-evaluation.   PLAN FOR NEXT SESSION: Continue with right LE nerve glides, and strength of quads and hip flexors. Traction as needed for pain relief. Restoration of R hip/knee ROM. Manual therapy and soft tissue work for low back prn.      Valentina Gu, PT, DPT [id number]  Eilleen Kempf 03/30/2022 8:00 AM

## 2022-04-04 ENCOUNTER — Encounter: Payer: Medicaid Other | Admitting: Physical Therapy

## 2022-04-06 ENCOUNTER — Encounter: Payer: Medicaid Other | Admitting: Physical Therapy

## 2022-04-11 ENCOUNTER — Encounter: Payer: Medicaid Other | Admitting: Physical Therapy

## 2022-04-13 ENCOUNTER — Encounter: Payer: Medicaid Other | Admitting: Physical Therapy

## 2022-04-20 ENCOUNTER — Telehealth: Payer: Self-pay

## 2022-04-20 NOTE — Progress Notes (Signed)
..   Medicaid Managed Care   Unsuccessful Outreach Note  04/20/2022 Name: Claudia Mills MRN: [id number] DOB: March 11, 1970  Referred by: Donnie Coffin, MD Reason for referral : No chief complaint on file.   An unsuccessful telephone outreach was attempted today. The patient was referred to the case management team for assistance with care management and care coordination.   Follow Up Plan: A HIPAA compliant phone message was left for the patient providing contact information and requesting a return call.  The care management team will reach out to the patient again over the next 7 days.    [place]

## 2022-06-10 ENCOUNTER — Telehealth: Payer: Self-pay

## 2022-06-10 NOTE — Telephone Encounter (Signed)
..   Medicaid Managed Care   Unsuccessful Outreach Note  06/10/2022 Name: Claudia Mills MRN: [id number] DOB: Jun 09, 1970  Referred by: Emogene Morgan, MD Reason for referral : Appointment   A second unsuccessful telephone outreach was attempted today. The patient was referred to the case management team for assistance with care management and care coordination.   Follow Up Plan: A HIPAA compliant phone message was left for the patient providing contact information and requesting a return call.  The care management team will reach out to the patient again over the next 7-14 days.   Weston Settle Care Guide  [facility] Managed  [facility]  [phone]

## 2022-06-22 ENCOUNTER — Telehealth: Payer: Self-pay

## 2022-06-22 NOTE — Telephone Encounter (Signed)
..   Medicaid Managed Care   Unsuccessful Outreach Note  06/22/2022 Name: Claudia Mills MRN: [id number] DOB: 1970-06-22  Referred by: Emogene Morgan, MD Reason for referral : Appointment   Third unsuccessful telephone outreach was attempted today. The patient was referred to the case management team for assistance with care management and care coordination. The patient's primary care provider has been notified of our unsuccessful attempts to make or maintain contact with the patient. The care management team is pleased to engage with this patient at any time in the future should he/she be interested in assistance from the care management team.   Follow Up Plan: We have been unable to make contact with the patient for follow up. The care management team is available to follow up with the patient after provider conversation with the patient regarding recommendation for care management engagement and subsequent re-referral to the care management team.   Weston Settle Care Guide  [facility] Managed  Care Guide [facility]  [phone]

## 2022-06-28 ENCOUNTER — Emergency Department: Payer: Medicaid Other

## 2022-06-28 ENCOUNTER — Encounter: Payer: Self-pay | Admitting: Emergency Medicine

## 2022-06-28 ENCOUNTER — Other Ambulatory Visit: Payer: Self-pay

## 2022-06-28 ENCOUNTER — Emergency Department
Admission: EM | Admit: 2022-06-28 | Discharge: 2022-06-28 | Disposition: A | Discharge status: Home / Self Care | Payer: Medicaid Other | Source: Home / Self Care | Attending: Emergency Medicine | Admitting: Emergency Medicine

## 2022-06-28 ENCOUNTER — Emergency Department
Admission: EM | Admit: 2022-06-28 | Discharge: 2022-06-28 | Disposition: A | Discharge status: Home / Self Care | Payer: Medicaid Other | Attending: Emergency Medicine | Admitting: Emergency Medicine

## 2022-06-28 DIAGNOSIS — Y92810 Car as the place of occurrence of the external cause: Secondary | ICD-10-CM | POA: Insufficient documentation

## 2022-06-28 DIAGNOSIS — E119 Type 2 diabetes mellitus without complications: Secondary | ICD-10-CM | POA: Diagnosis not present

## 2022-06-28 DIAGNOSIS — M47816 Spondylosis without myelopathy or radiculopathy, lumbar region: Secondary | ICD-10-CM | POA: Diagnosis not present

## 2022-06-28 DIAGNOSIS — I2510 Atherosclerotic heart disease of native coronary artery without angina pectoris: Secondary | ICD-10-CM | POA: Insufficient documentation

## 2022-06-28 DIAGNOSIS — M545 Low back pain, unspecified: Secondary | ICD-10-CM | POA: Diagnosis not present

## 2022-06-28 DIAGNOSIS — I10 Essential (primary) hypertension: Secondary | ICD-10-CM | POA: Insufficient documentation

## 2022-06-28 DIAGNOSIS — X501XXA Overexertion from prolonged static or awkward postures, initial encounter: Secondary | ICD-10-CM | POA: Insufficient documentation

## 2022-06-28 DIAGNOSIS — R42 Dizziness and giddiness: Secondary | ICD-10-CM | POA: Insufficient documentation

## 2022-06-28 DIAGNOSIS — M8588 Other specified disorders of bone density and structure, other site: Secondary | ICD-10-CM | POA: Diagnosis not present

## 2022-06-28 DIAGNOSIS — R0789 Other chest pain: Secondary | ICD-10-CM | POA: Insufficient documentation

## 2022-06-28 DIAGNOSIS — S32010A Wedge compression fracture of first lumbar vertebra, initial encounter for closed fracture: Secondary | ICD-10-CM

## 2022-06-28 DIAGNOSIS — S3992XA Unspecified injury of lower back, initial encounter: Secondary | ICD-10-CM | POA: Diagnosis not present

## 2022-06-28 DIAGNOSIS — S32019A Unspecified fracture of first lumbar vertebra, initial encounter for closed fracture: Secondary | ICD-10-CM | POA: Insufficient documentation

## 2022-06-28 DIAGNOSIS — R079 Chest pain, unspecified: Secondary | ICD-10-CM | POA: Diagnosis not present

## 2022-06-28 HISTORY — DX: Essential (primary) hypertension: I10

## 2022-06-28 LAB — CBC
HCT: 45.1 % (ref 36.0–46.0)
Hemoglobin: 15.4 g/dL — ABNORMAL HIGH (ref 12.0–15.0)
MCH: 34.1 pg — ABNORMAL HIGH (ref 26.0–34.0)
MCHC: 34.1 g/dL (ref 30.0–36.0)
MCV: 100 fL (ref 80.0–100.0)
Platelets: 218 10*3/uL (ref 150–400)
RBC: 4.51 MIL/uL (ref 3.87–5.11)
RDW: 12 % (ref 11.5–15.5)
WBC: 10.5 10*3/uL (ref 4.0–10.5)
nRBC: 0 % (ref 0.0–0.2)

## 2022-06-28 LAB — TROPONIN I (HIGH SENSITIVITY)
Troponin I (High Sensitivity): 6 ng/L (ref <18)
Troponin I (High Sensitivity): 6 ng/L (ref <18)

## 2022-06-28 LAB — BASIC METABOLIC PANEL
Anion gap: 13 (ref 5–15)
BUN: 8 mg/dL (ref 6–20)
CO2: 21 mmol/L — ABNORMAL LOW (ref 22–32)
Calcium: 9.3 mg/dL (ref 8.9–10.3)
Chloride: 100 mmol/L (ref 98–111)
Creatinine, Ser: 0.52 mg/dL (ref 0.44–1.00)
GFR, Estimated: >60 mL/min (ref >60)
Glucose, Bld: 369 mg/dL — ABNORMAL HIGH (ref 70–99)
Potassium: 3.9 mmol/L (ref 3.5–5.1)
Sodium: 134 mmol/L — ABNORMAL LOW (ref 135–145)

## 2022-06-28 MED ORDER — OXYCODONE-ACETAMINOPHEN 5-325 MG PO TABS: 1.0000 | ORAL_TABLET | ORAL | 0 refills | Status: DC | PRN

## 2022-06-28 MED ORDER — ONDANSETRON 4 MG PO TBDP
4.0000 mg | ORAL_TABLET | Freq: Once | ORAL | Status: AC
  Administered 2022-06-28: 4 mg via ORAL
  Filled 2022-06-28: qty 1

## 2022-06-28 MED ORDER — OXYCODONE-ACETAMINOPHEN 5-325 MG PO TABS
1.0000 | ORAL_TABLET | Freq: Once | ORAL | Status: AC
  Administered 2022-06-28: 1 via ORAL
  Filled 2022-06-28: qty 1

## 2022-06-28 MED ORDER — LISINOPRIL 5 MG PO TABS
5.0000 mg | ORAL_TABLET | Freq: Once | ORAL | Status: AC
  Administered 2022-06-28: 5 mg via ORAL
  Filled 2022-06-28: qty 1

## 2022-06-28 MED ORDER — MECLIZINE HCL 25 MG PO TABS: 25.0000 mg | ORAL_TABLET | Freq: Three times a day (TID) | ORAL | 0 refills | Status: DC | PRN

## 2022-06-28 MED ORDER — METOPROLOL SUCCINATE ER 50 MG PO TB24
100.0000 mg | ORAL_TABLET | Freq: Once | ORAL | Status: AC
  Administered 2022-06-28: 100 mg via ORAL
  Filled 2022-06-28: qty 2

## 2022-06-28 MED ORDER — ONDANSETRON 4 MG PO TBDP: 4.0000 mg | ORAL_TABLET | Freq: Three times a day (TID) | ORAL | 0 refills | Status: DC | PRN

## 2022-06-28 MED ORDER — NITROGLYCERIN 0.4 MG SL SUBL
0.4000 mg | SUBLINGUAL_TABLET | Freq: Once | SUBLINGUAL | Status: AC
  Administered 2022-06-28: 0.4 mg via SUBLINGUAL
  Filled 2022-06-28: qty 1

## 2022-06-28 MED ORDER — KETOROLAC TROMETHAMINE 30 MG/ML IJ SOLN
15.0000 mg | Freq: Once | INTRAMUSCULAR | Status: AC
Start: 2022-06-28 — End: 2022-06-28
  Administered 2022-06-28: 15 mg via INTRAVENOUS
  Filled 2022-06-28: qty 1

## 2022-06-28 MED ORDER — LIDOCAINE 5 % EX PTCH
1.0000 | MEDICATED_PATCH | CUTANEOUS | Status: DC
  Administered 2022-06-28: 1 via TRANSDERMAL
  Filled 2022-06-28: qty 1

## 2022-06-28 NOTE — ED Triage Notes (Signed)
Pt ems for bain pain. Pt states that she was trying to urinate, turned suddenly, and felt a pop and 10/10 pain started. Pt has hx L2 compression fx.

## 2022-06-28 NOTE — ED Provider Notes (Signed)
[facility] Provider Note    Event Date/Time   First MD Initiated Contact with Patient 06/28/22 1026     (approximate)   History   Chest Pain   HPI  Claudia Mills is a 52 y.o. female with a history of diabetes hypertension, CAD who presents with complaints of mild chest discomfort which is resolving as well as complaints of vertigo.  Patient reports when she turned over in bed this morning she felt the room spinning.  She has never had this before.  She denies neurodeficits.  No headache.  No ear ringing.  Long history of uncontrolled high blood pressure     Physical Exam   Triage Vital Signs: ED Triage Vitals  Enc Vitals Group     BP 06/28/22 0941 (!) 198/89     Pulse Rate 06/28/22 0941 (!) 103     Resp 06/28/22 0941 20     Temp 06/28/22 0941 97.8 F (36.6 C)     Temp src --      SpO2 06/28/22 0941 100 %     Weight 06/28/22 0940 59 kg (130 lb)     Height 06/28/22 0940 1.676 m (5\' 6" )     Head Circumference --      Peak Flow --      Pain Score 06/28/22 0940 6     Pain Loc --      Pain Edu? --      Excl. in GC? --     Most recent vital signs: Vitals:   06/28/22 0941  BP: (!) 198/89  Pulse: (!) 103  Resp: 20  Temp: 97.8 F (36.6 C)  SpO2: 100%     General: Awake, no distress.  CV:  Good peripheral perfusion.  Resp:  Normal effort.  Abd:  No distention.  Other:  Cranial nerves II through XII are normal, patient is well-appearing and in no acute distress.  Normal strength in all extremities, ambulating well.  Mild left-sided nystagmus   ED Results / Procedures / Treatments   Labs (all labs ordered are listed, but only abnormal results are displayed) Labs Reviewed  BASIC METABOLIC PANEL - Abnormal; Notable for the following components:      Result Value   Sodium 134 (*)    CO2 21 (*)    Glucose, Bld 369 (*)    All other components within normal limits  CBC - Abnormal; Notable for the following components:   Hemoglobin 15.4 (*)     MCH 34.1 (*)    All other components within normal limits  TROPONIN I (HIGH SENSITIVITY)  TROPONIN I (HIGH SENSITIVITY)     EKG  ED ECG REPORT I, Jene Every, the attending physician, personally viewed and interpreted this ECG.  Date: 06/28/2022  Rhythm: normal sinus rhythm QRS Axis: normal Intervals: normal ST/T Wave abnormalities: normal Narrative Interpretation: no evidence of acute ischemia    RADIOLOGY Chest x-ray viewed interpreted by me, no acute abnormality    PROCEDURES:  Critical Care performed:   Procedures   MEDICATIONS ORDERED IN ED: Medications  nitroGLYCERIN (NITROSTAT) SL tablet 0.4 mg (0.4 mg Sublingual Given 06/28/22 1148)     IMPRESSION / MDM / ASSESSMENT AND PLAN / ED COURSE  I reviewed the triage vital signs and the nursing notes. Patient's presentation is most consistent with acute presentation with potential threat to life or bodily function.  Patient presents with chest discomfort and vertigo as detailed above.  She reports chest discomfort is resolving and  is not unusual for her, she reports typically she takes a nitroglycerin and her symptoms resolve.  Vertigo is new for her and developed while she was lying in bed and then rolled over.  This seems to be a peripheral vertigo, she does have mild nystagmus, no concerning neurodeficits.  EKG, delta troponin reassuring.  Glucose mildly elevated but she reports this is in line with where it is typically.  She has poorly controlled diabetic and hypertension  She continues to have mild vertigo chest pain has resolved after nitroglycerin.  Will treat with Zofran, meclizine, ENT follow-up.  No indication for admission at this time.  Recommend follow-up with cardiology, return precautions discussed        FINAL CLINICAL IMPRESSION(S) / ED DIAGNOSES   Final diagnoses:  Atypical chest pain  Vertigo     Rx / DC Orders   ED Discharge Orders          Ordered    meclizine (ANTIVERT)  25 MG tablet  3 times daily PRN        06/28/22 1255    ondansetron (ZOFRAN-ODT) 4 MG disintegrating tablet  Every 8 hours PRN        06/28/22 1255             Note:  This document was prepared using Dragon voice recognition software and may include unintentional dictation errors.   Jene Every, MD 06/28/22 1314

## 2022-06-28 NOTE — ED Notes (Signed)
[facility] ortho tech called [phone] about TLSO brace.

## 2022-06-28 NOTE — ED Triage Notes (Signed)
Pt to ED for left sided chest pain, n/v started within past hour. Hx MI.

## 2022-06-28 NOTE — ED Provider Notes (Signed)
[facility] Provider Note    Event Date/Time   First MD Initiated Contact with Patient 06/28/22 1617     (approximate)   History   Chief Complaint Back Pain   HPI  Claudia Mills is a 52 y.o. female with past medical history of hypertension, diabetes, and CAD who presents to the ED complaining of back pain.  Patient reports that she was sitting in the front seat of her car attempting to urinate into an emesis bag, lifting her hips up off of the seat in order to do so.  She then felt a sudden "pop" in the middle of her lower back with onset of severe pain.  She reports issues with her back in this area in the past, reports history of compression fracture remotely.  Pain does not seem to radiate down either leg and she denies any numbness or weakness in her extremities.  She has not had any numbness in her groin and denies any bowel or bladder incontinence.  She has not taken anything for her symptoms prior to arrival.  She was seen in the ED earlier today for dizziness and chest pain, both which she states have resolved.     Physical Exam   Triage Vital Signs: ED Triage Vitals  Enc Vitals Group     BP 06/28/22 1625 (!) 198/84     Pulse Rate 06/28/22 1625 96     Resp 06/28/22 1625 14     Temp 06/28/22 1625 97.8 F (36.6 C)     Temp Source 06/28/22 1625 Oral     SpO2 06/28/22 1625 100 %     Weight 06/28/22 1626 129 lb 13.6 oz (58.9 kg)     Height 06/28/22 1626 5\' 6"  (1.676 m)     Head Circumference --      Peak Flow --      Pain Score 06/28/22 1626 10     Pain Loc --      Pain Edu? --      Excl. in GC? --     Most recent vital signs: Vitals:   06/28/22 1900 06/28/22 2000  BP: (!) 220/129 (!) 196/90  Pulse: 93 (!) 104  Resp: 20 20  Temp:    SpO2: 100% 99%    Constitutional: Alert and oriented. Eyes: Conjunctivae are normal. Head: Atraumatic. Nose: No congestion/rhinnorhea. Mouth/Throat: Mucous membranes are moist.  Cardiovascular: Normal  rate, regular rhythm. Grossly normal heart sounds.  2+ radial and DP pulses bilaterally. Respiratory: Normal respiratory effort.  No retractions. Lungs CTAB. Gastrointestinal: Soft and nontender. No distention. Musculoskeletal: No lower extremity tenderness nor edema.  Midline lumbar spinal tenderness to palpation noted. Neurologic:  Normal speech and language. No gross focal neurologic deficits are appreciated.    ED Results / Procedures / Treatments   Labs (all labs ordered are listed, but only abnormal results are displayed) Labs Reviewed - No data to display   EKG  ED ECG REPORT I, Chesley Noon, the attending physician, personally viewed and interpreted this ECG.   Date: 06/28/2022  EKG Time: 16:22  Rate: 101  Rhythm: sinus tachycardia  Axis: Normal  Intervals: Prolonged QT  ST&T Change: None  RADIOLOGY CT lumbar spine reviewed and interpreted by me with compression fracture at L1.  PROCEDURES:  Critical Care performed: No  Procedures   MEDICATIONS ORDERED IN ED: Medications  lidocaine (LIDODERM) 5 % 1 patch (1 patch Transdermal Patch Applied 06/28/22 1708)  ketorolac (TORADOL) 30 MG/ML injection 15 mg (  15 mg Intravenous Given 06/28/22 1707)  oxyCODONE-acetaminophen (PERCOCET/ROXICET) 5-325 MG per tablet 1 tablet (1 tablet Oral Given 06/28/22 1938)  lisinopril (ZESTRIL) tablet 5 mg (5 mg Oral Given 06/28/22 1938)  metoprolol succinate (TOPROL-XL) 24 hr tablet 100 mg (100 mg Oral Given 06/28/22 1938)     IMPRESSION / MDM / ASSESSMENT AND PLAN / ED COURSE  I reviewed the triage vital signs and the nursing notes.                              52 y.o. female with past medical history of hypertension, diabetes, and CAD who presents to the ED with increasing pain in her lower back after lifting up her pelvis in her car just prior to arrival.  Patient's presentation is most consistent with acute presentation with potential threat to life or bodily  function.  Differential diagnosis includes, but is not limited to, compression fracture, radiculopathy, lumbar strain, cauda equina.  Patient well-appearing and in no acute distress, vital signs are unremarkable.  She remains neurovascularly intact to her bilateral lower extremities with no findings on history or exam that are concerning for cauda equina.  Given sudden onset of pain, we will further assess with x-ray, treat symptomatically with IV Toradol and Lidoderm patch.  Patient states she does not think there is any chance she could be pregnant.  X-ray imaging is concerning for lumbar compression fracture at L1, chronic appearing compression fracture at L2 noted.  This was further assessed with CT imaging which confirms acute compression fracture at L1 with chronic findings at L2.  Findings reviewed with Dr. Marcell Barlow of neurosurgery, who recommends TLSO placement and outpatient follow-up.  Will prescribe short course of pain medication, patient counseled to follow-up with neurosurgery and to return to the ED for new or worsening symptoms, patient agrees with plan.      FINAL CLINICAL IMPRESSION(S) / ED DIAGNOSES   Final diagnoses:  Compression fracture of L1 vertebra, initial encounter (HCC)     Rx / DC Orders   ED Discharge Orders          Ordered    oxyCODONE-acetaminophen (PERCOCET) 5-325 MG tablet  Every 4 hours PRN        06/28/22 2100             Note:  This document was prepared using Dragon voice recognition software and may include unintentional dictation errors.   Chesley Noon, MD 06/28/22 2101

## 2022-06-28 NOTE — Progress Notes (Signed)
Orthopedic Tech Progress Note Patient Details:  Claudia Mills [birth year] [id number] Called in order to [facility] for TLSO Patient ID: Claudia Mills, female   DOB: [birth year], 52 y.o.   MRN: [id number]  Claudia Mills 06/28/2022, 6:44 PM

## 2022-06-29 ENCOUNTER — Telehealth: Payer: Self-pay | Admitting: *Deleted

## 2022-06-29 NOTE — Transitions of Care (Post Inpatient/ED Visit) (Signed)
   06/29/2022  Name: Cadi Seahorn MRN: [id number] DOB: Dec 08, 1970  Today's TOC FU Call Status: Today's TOC FU Call Status:: Unsuccessul Call (1st Attempt) Unsuccessful Call (1st Attempt) Date: 06/29/22  Attempted to reach the patient regarding the most recent Inpatient/ED visit.  Follow Up Plan: Additional outreach attempts will be made to reach the patient to complete the Transitions of Care (Post Inpatient/ED visit) call.   [name] RN, BSN [place]  Managed [facility] RN Care Coordinator [phone]

## 2022-07-01 ENCOUNTER — Telehealth: Payer: Self-pay | Admitting: *Deleted

## 2022-07-01 NOTE — Transitions of Care (Post Inpatient/ED Visit) (Signed)
07/01/2022  Name: Claudia Mills MRN: [id number] DOB: 12-18-1970  Today's TOC FU Call Status: Today's TOC FU Call Status:: Successful TOC FU Call Competed TOC FU Call Complete Date: 07/01/22  Transition Care Management Follow-up Telephone Call How have you been since you were released from the hospital?: Worse  Items Reviewed: Did you receive and understand the discharge instructions provided?: Yes Medications obtained,verified, and reconciled?: Yes (Medications Reviewed) Any new allergies since your discharge?: No Dietary orders reviewed?: NA Do you have support at home?: Yes People in Home: parent(s) Name of Support/Comfort Primary Source: Mother/Claudia Mills  Medications Reviewed Today: Medications Reviewed Today     Reviewed by Heidi Dach, RN (Registered Nurse) on 07/01/22 at 1627  Med List Status: <None>   Medication Order Taking? Sig Documenting Provider Last Dose Status Informant  aspirin EC 81 MG tablet [id number] Yes Take 81 mg by mouth daily. Swallow whole. [provider] Taking Active   atorvastatin (LIPITOR) 80 MG tablet [id number]  Take 1 tablet (80 mg total) by mouth daily. Gilles Chiquito, MD  Expired 05/12/21 2359   DULoxetine (CYMBALTA) 30 MG capsule [id number]  Take 30 mg by mouth daily. [provider]  Active   Empagliflozin (JARDIANCE PO) [id number] Yes Take by mouth. [provider] Taking Active   gabapentin (NEURONTIN) 300 MG capsule [id number]  Take 300 mg by mouth 3 (three) times daily. [provider]  Active   glipiZIDE (GLUCOTROL) 5 MG tablet [id number]  Take 1 tablet (5 mg total) by mouth daily before breakfast. Darci Current, MD  Expired 03/30/16 2359   lisinopril (ZESTRIL) 5 MG tablet [id number]  Take 1 tablet (5 mg total) by mouth daily. Gilles Chiquito, MD  Expired 02/11/22 2359   LISINOPRIL PO [id number] Yes Take by mouth. [provider] Taking Active   meclizine (ANTIVERT) 25 MG tablet [id number] No Take 1  tablet (25 mg total) by mouth 3 (three) times daily as needed for dizziness.  Patient not taking: Reported on 07/01/2022   Jene Every, MD Not Taking Active   meloxicam (MOBIC) 15 MG tablet [id number] No Take 1 tablet (15 mg total) by mouth daily.  Patient not taking: Reported on 07/01/2022   Cuthriell, Delorise Royals, PA-C Not Taking Active   metFORMIN (GLUCOPHAGE) 500 MG tablet [id number]  Take 1 tablet (500 mg total) by mouth 2 (two) times daily with a meal. Gilles Chiquito, MD  Expired 05/12/21 2359   metoprolol succinate (TOPROL XL) 100 MG 24 hr tablet [id number]  Take 1 tablet (100 mg total) by mouth daily. Take with or immediately following a meal. Gilles Chiquito, MD  Expired 02/11/22 2359   nitroGLYCERIN (NITROSTAT) 0.3 MG SL tablet [id number] Yes Place 0.3 mg under the tongue every 5 (five) minutes as needed for chest pain. [provider] Taking Active   ondansetron (ZOFRAN-ODT) 4 MG disintegrating tablet [id number] Yes Take 1 tablet (4 mg total) by mouth every 8 (eight) hours as needed for nausea or vomiting. Jene Every, MD Taking Active   oxyCODONE-acetaminophen (PERCOCET) 5-325 MG tablet [id number] Yes Take 1 tablet by mouth every 4 (four) hours as needed for severe pain. Chesley Noon, MD Taking Active             Home Care and Equipment/Supplies: Were Home Health Services Ordered?: NA Any new equipment or medical supplies ordered?: NA  Functional Questionnaire: Do you need assistance with bathing/showering or dressing?: Yes Do you need assistance with meal preparation?: Yes Do you need  assistance with eating?: No Do you have difficulty maintaining continence: No Do you need assistance with getting out of bed/getting out of a chair/moving?: No Do you have difficulty managing or taking your medications?: No  Follow up appointments reviewed: PCP Follow-up appointment confirmed?: NA Specialist Hospital Follow-up appointment confirmed?: Yes Date of Specialist  follow-up appointment?: 07/05/22 Follow-Up Specialty Provider:: Neurosurgery Do you need transportation to your follow-up appointment?: Yes Transportation Need Intervention Addressed By:: Other: (Provided patietn with [facility] [phone]) Do you understand care options if your condition(s) worsen?: Yes-patient verbalized understanding  SDOH Interventions Today    Flowsheet Row Most Recent Value  SDOH Interventions   Transportation Interventions Payor Benefit  [provided with [facility] [phone]      Advised patient to contact [facility] to update current PCP information.  [name] RN, BSN [place]  Managed [facility] RN Care Coordinator [phone]

## 2022-07-04 NOTE — Progress Notes (Deleted)
Referring Physician:  Emogene Morgan, MD [address] [place],  [place] [address]  Primary Physician:  Emogene Morgan, MD  History of Present Illness: 07/04/2022*** [name] has a history of HTN, CAD, and DM.   Seen in ED on 06/28/22 after injury to back and feeling a pop. History of compression fractures in the past. She was found to have an acute L1 compression fracture  and likely chronic L2 compression fracture. She was placed in TLSO brace.   She is here for follow up.       Given percocet 5/325 from the ED.   Duration: *** Location: *** Quality: *** Severity: ***  Precipitating: aggravated by *** Modifying factors: made better by *** Weakness: none Timing: *** Bowel/Bladder Dysfunction: none  Conservative measures:  Physical therapy: ***  Multimodal medical therapy including regular antiinflammatories: percocet   Injections: *** epidural steroid injections  Past Surgery: ***  Claudia Mills has ***no symptoms of cervical myelopathy.  The symptoms are causing a significant impact on the patient's life.   Review of Systems:  A 10 point review of systems is negative, except for the pertinent positives and negatives detailed in the HPI.  Past Medical History: Past Medical History:  Diagnosis Date   Diabetes mellitus without complication (HCC)    Hypertension    MI (myocardial infarction) (HCC)     Past Surgical History: Past Surgical History:  Procedure Laterality Date   CORONARY ANGIOPLASTY WITH STENT PLACEMENT      Allergies: Allergies as of 07/05/2022 - Review Complete 07/01/2022  Allergen Reaction Noted   Bee venom Anaphylaxis 04/07/2021    Medications: Outpatient Encounter Medications as of 07/05/2022  Medication Sig   aspirin EC 81 MG tablet Take 81 mg by mouth daily. Swallow whole.   atorvastatin (LIPITOR) 80 MG tablet Take 1 tablet (80 mg total) by mouth daily.   DULoxetine (CYMBALTA) 30 MG capsule Take 30 mg by mouth daily.    Empagliflozin (JARDIANCE PO) Take by mouth.   gabapentin (NEURONTIN) 300 MG capsule Take 300 mg by mouth 3 (three) times daily.   glipiZIDE (GLUCOTROL) 5 MG tablet Take 1 tablet (5 mg total) by mouth daily before breakfast.   lisinopril (ZESTRIL) 5 MG tablet Take 1 tablet (5 mg total) by mouth daily.   LISINOPRIL PO Take by mouth.   meclizine (ANTIVERT) 25 MG tablet Take 1 tablet (25 mg total) by mouth 3 (three) times daily as needed for dizziness. (Patient not taking: Reported on 07/01/2022)   meloxicam (MOBIC) 15 MG tablet Take 1 tablet (15 mg total) by mouth daily. (Patient not taking: Reported on 07/01/2022)   metFORMIN (GLUCOPHAGE) 500 MG tablet Take 1 tablet (500 mg total) by mouth 2 (two) times daily with a meal.   metoprolol succinate (TOPROL XL) 100 MG 24 hr tablet Take 1 tablet (100 mg total) by mouth daily. Take with or immediately following a meal.   nitroGLYCERIN (NITROSTAT) 0.3 MG SL tablet Place 0.3 mg under the tongue every 5 (five) minutes as needed for chest pain.   ondansetron (ZOFRAN-ODT) 4 MG disintegrating tablet Take 1 tablet (4 mg total) by mouth every 8 (eight) hours as needed for nausea or vomiting.   oxyCODONE-acetaminophen (PERCOCET) 5-325 MG tablet Take 1 tablet by mouth every 4 (four) hours as needed for severe pain.   No facility-administered encounter medications on file as of 07/05/2022.    Social History: Social History   Tobacco Use   Smoking status: Former  Smokeless tobacco: Never  Substance Use Topics   Alcohol use: Yes    Alcohol/week: 2.0 standard drinks of alcohol    Types: 2 Glasses of wine per week   Drug use: Yes    Types: Marijuana    Family Medical History: No family history on file.  Physical Examination: There were no vitals filed for this visit.  General: Patient is well developed, well nourished, calm, collected, and in no apparent distress. Attention to examination is appropriate.  Respiratory: Patient is breathing without any  difficulty.   NEUROLOGICAL:     Awake, alert, oriented to person, place, and time.  Speech is clear and fluent. Fund of knowledge is appropriate.   Cranial Nerves: Pupils equal round and reactive to light.  Facial tone is symmetric.    *** ROM of cervical spine *** pain *** posterior cervical tenderness. *** tenderness in bilateral trapezial region.   *** ROM of lumbar spine *** pain *** posterior lumbar tenderness.   No abnormal lesions on exposed skin.   Strength: Side Biceps Triceps Deltoid Interossei Grip Wrist Ext. Wrist Flex.  R 5 5 5 5 5 5 5   L 5 5 5 5 5 5 5    Side Iliopsoas Quads Hamstring PF DF EHL  R 5 5 5 5 5 5   L 5 5 5 5 5 5    Reflexes are ***2+ and symmetric at the biceps, triceps, brachioradialis, patella and achilles.   Hoffman's is absent.  Clonus is not present.   Bilateral upper and lower extremity sensation is intact to light touch.     Gait is normal.   ***No difficulty with tandem gait.    Medical Decision Making  Imaging: CT of lumbar spine dated 06/28/22:  FINDINGS: Segmentation: 5 lumbar vertebrae. The caudal most well-formed intervertebral disc space is designated L5-S1.   Alignment: 2 mm bony retropulsion at the level of the L1 superior endplate. 2 mm bony retropulsion at the level of the L2 inferior endplate   Vertebrae: L1 vertebral compression fracture (30-40% height loss) with vertically oriented fracture through the vertebral body, new from the prior lumbar spine CT of 04/07/2021 and acute in appearance. Height loss at site of an L2 inferior endplate compression fracture has progressed from the prior lumbar spine CT of 04/07/2021 (now 40-50%). This is age indeterminate, but favored chronic given the degree of sclerosis along the L2 inferior endplate. Lumbar vertebral body height is otherwise maintained. Degenerative endplate sclerosis at L4-L5 and L5-S1.   Paraspinal and other soft tissues: Aortoiliac atherosclerosis. Punctate  nonobstructing bilateral renal calculi. Distended urinary bladder, incompletely imaged. No paraspinal hematoma.   Disc levels:   Multilevel disc space narrowing, greatest at L4-L5 (advanced) and L5-S1 (moderate to advanced). Disc vacuum phenomenon at L2-L3, L4-L5 and L5-S1.   T12-L1: Mild bony retropulsion at the level of the L1 superior endplate. No significant disc herniation or stenosis.   L1-L2: No significant disc herniation or stenosis.   L2-L3: Mild bony retropulsion at the level of the L2 inferior endplate, slightly progressed. Progressive disc bulge. Facet arthrosis and ligamentum flavum hypertrophy. Right greater than left subarticular narrowing. Mild narrowing of the central canal. No significant foraminal stenosis.   L3-L4: Disc bulge. Superimposed broad-based disc protrusion spanning the central and bilateral subarticular zones. Facet arthrosis and ligamentum flavum hypertrophy. Bilateral subarticular narrowing. Moderate central canal stenosis. No significant foraminal stenosis. As before, there is a large anterior disc extrusion near midline which contacts the aorta.   L4-L5: Disc bulge with endplate  osteophytes. Superimposed moderately large central disc extrusion with mild caudal migration. Facet arthrosis and ligamentum flavum hypertrophy. Severe bilateral subarticular and central canal stenosis. Bilateral neural foraminal narrowing (moderate/severe right, mild left).   L5-S1: Disc bulge with endplate spurring. Superimposed moderately enlarged central/left subarticular disc extrusion with mild cranial migration. Facet arthrosis and ligamentum flavum hypertrophy. The disc extrusion results in severe left subarticular stenosis and likely encroaches upon the descending left S1 nerve root. Mild right subarticular stenosis. Moderate/severe bilateral neural foraminal narrowing.   IMPRESSION: 1. L1 compression fracture (30-40% height loss) with vertically-oriented  fracture through the vertebral body, new from the prior lumbar spine CT of 04/07/2021 and acute in appearance. 2. L2 inferior endplate vertebral compression fracture with progressive height loss as compared to the prior lumbar spine CT (now 40-50%). This is age-indeterminate but favored chronic given the degree of sclerosis at this site. 3. Apart from mild progression of a disc bulge at L2-L3, lumbar spondylosis is unchanged from the prior exam. Findings are most notably as follows. 4. At L3-L4, there is multifactorial bilateral subarticular narrowing and moderate central canal stenosis. 5. At L4-L5, there is multifactorial severe bilateral subarticular and central canal stenosis. Moderate/severe right neural foraminal and also present at this level. 6. At L5-S1, there is multifactorial severe left subarticular stenosis. Moderate/severe bilateral neural foraminal narrowing also present at this level. 7. Bilateral nonobstructive nephrolithiasis. 8. Distended urinary bladder, incompletely imaged. 9.  Aortic Atherosclerosis (ICD10-I70.0).     Electronically Signed   By: Jackey Loge D.O.   On: 06/28/2022 18:24   Lumbar xrays dated 06/28/22:   FINDINGS: Five lumbar-type vertebral bodies. Osteopenia. Moderate disc height loss at L4-5 and L5-S1 with endplate osteophytes. Scattered osteophytes elsewhere as well. Slight dextroconvex curvature of the upper lumbar spine. Compression of the inferior endplate of L2 but progressive height loss and more sclerosis. This also new mild-to-moderate compression of L1. This could be acute. Tubal ligation clips along the pelvis.   IMPRESSION: Increasing compression of L2 with more sclerosis. New compression of L1. This could be acute. Please correlate with symptoms and if needed additional cross-sectional imaging workup.   Degenerative changes particularly at L4-5 and L5-S1.   Osteopenia.     Electronically Signed   By: Karen Kays M.D.    On: 06/28/2022 17:10    I have personally reviewed the images and agree with the above interpretation.  Assessment and Plan: Claudia Mills is a pleasant 52 y.o. female has ***  Treatment options discussed with patient and following plan made:   - Order for physical therapy for *** spine ***. Patient to call to schedule appointment. *** - Continue current medications including ***. Reviewed dosing and side effects.  - Prescription for ***. Reviewed dosing and side effects. Take with food.  - Prescription for *** to take prn muscle spasms. Reviewed dosing and side effects. Discussed this can cause drowsiness.  - MRI of *** to further evaluate *** radiculopathy. No improvement time or medications (***).  - Referral to PMR at [facility] to discuss possible *** injections.  - Will schedule phone visit to review MRI results once I get them back.   I spent a total of *** minutes in face-to-face and non-face-to-face activities related to this patient's care today including review of outside records, review of imaging, review of symptoms, physical exam, discussion of differential diagnosis, discussion of treatment options, and documentation.   Thank you for involving me in the care of this patient.   Drake Leach PA-C  Dept. of Neurosurgery

## 2022-07-05 ENCOUNTER — Inpatient Hospital Stay
Admission: RE | Admit: 2022-07-05 | Discharge: 2022-07-05 | Disposition: A | Discharge status: Home / Self Care | Payer: Self-pay | Source: Ambulatory Visit | Attending: Orthopedic Surgery | Admitting: Orthopedic Surgery

## 2022-07-05 ENCOUNTER — Ambulatory Visit: Payer: Medicaid Other | Admitting: Orthopedic Surgery

## 2022-07-05 ENCOUNTER — Other Ambulatory Visit: Payer: Self-pay

## 2022-07-05 DIAGNOSIS — Z049 Encounter for examination and observation for unspecified reason: Secondary | ICD-10-CM

## 2022-07-07 DIAGNOSIS — Z0131 Encounter for examination of blood pressure with abnormal findings: Secondary | ICD-10-CM | POA: Diagnosis not present

## 2022-07-07 DIAGNOSIS — M4856XD Collapsed vertebra, not elsewhere classified, lumbar region, subsequent encounter for fracture with routine healing: Secondary | ICD-10-CM | POA: Diagnosis not present

## 2022-07-07 DIAGNOSIS — Z1389 Encounter for screening for other disorder: Secondary | ICD-10-CM | POA: Diagnosis not present

## 2022-07-22 ENCOUNTER — Other Ambulatory Visit: Payer: Self-pay | Admitting: Orthopedic Surgery

## 2022-07-22 DIAGNOSIS — S32010A Wedge compression fracture of first lumbar vertebra, initial encounter for closed fracture: Secondary | ICD-10-CM

## 2022-07-22 NOTE — Progress Notes (Unsigned)
Referring Physician:  Emogene Morgan, MD [address] [place],  [place] [address]  Primary Physician:  Emogene Morgan, MD  History of Present Illness: 07/22/2022*** Ms. Jem Henshaw has a history of HTN, CAD, and DM.   Seen in ED on 06/28/22 after injury to back and feeling a pop. History of compression fractures in the past. She was found to have an acute L1 compression fracture  and likely chronic L2 compression fracture. She was placed in TLSO brace.   She is here for follow up.       Given percocet 5/325 from the ED.   Duration: *** Location: *** Quality: *** Severity: ***  Precipitating: aggravated by *** Modifying factors: made better by *** Weakness: none Timing: *** Bowel/Bladder Dysfunction: none  Conservative measures:  Physical therapy: ***  Multimodal medical therapy including regular antiinflammatories: percocet   Injections: *** epidural steroid injections  Past Surgery: ***  [name] has ***no symptoms of cervical myelopathy.  The symptoms are causing a significant impact on the patient's life.   Review of Systems:  A 10 point review of systems is negative, except for the pertinent positives and negatives detailed in the HPI.  Past Medical History: Past Medical History:  Diagnosis Date   Diabetes mellitus without complication (HCC)    Hypertension    MI (myocardial infarction) (HCC)     Past Surgical History: Past Surgical History:  Procedure Laterality Date   CORONARY ANGIOPLASTY WITH STENT PLACEMENT      Allergies: Allergies as of 07/27/2022 - Review Complete 07/01/2022  Allergen Reaction Noted   Bee venom Anaphylaxis 04/07/2021    Medications: Outpatient Encounter Medications as of 07/27/2022  Medication Sig   aspirin EC 81 MG tablet Take 81 mg by mouth daily. Swallow whole.   atorvastatin (LIPITOR) 80 MG tablet Take 1 tablet (80 mg total) by mouth daily.   DULoxetine (CYMBALTA) 30 MG capsule Take 30 mg by mouth daily.    Empagliflozin (JARDIANCE PO) Take by mouth.   gabapentin (NEURONTIN) 300 MG capsule Take 300 mg by mouth 3 (three) times daily.   glipiZIDE (GLUCOTROL) 5 MG tablet Take 1 tablet (5 mg total) by mouth daily before breakfast.   lisinopril (ZESTRIL) 5 MG tablet Take 1 tablet (5 mg total) by mouth daily.   LISINOPRIL PO Take by mouth.   meclizine (ANTIVERT) 25 MG tablet Take 1 tablet (25 mg total) by mouth 3 (three) times daily as needed for dizziness. (Patient not taking: Reported on 07/01/2022)   meloxicam (MOBIC) 15 MG tablet Take 1 tablet (15 mg total) by mouth daily. (Patient not taking: Reported on 07/01/2022)   metFORMIN (GLUCOPHAGE) 500 MG tablet Take 1 tablet (500 mg total) by mouth 2 (two) times daily with a meal.   metoprolol succinate (TOPROL XL) 100 MG 24 hr tablet Take 1 tablet (100 mg total) by mouth daily. Take with or immediately following a meal.   nitroGLYCERIN (NITROSTAT) 0.3 MG SL tablet Place 0.3 mg under the tongue every 5 (five) minutes as needed for chest pain.   ondansetron (ZOFRAN-ODT) 4 MG disintegrating tablet Take 1 tablet (4 mg total) by mouth every 8 (eight) hours as needed for nausea or vomiting.   oxyCODONE-acetaminophen (PERCOCET) 5-325 MG tablet Take 1 tablet by mouth every 4 (four) hours as needed for severe pain.   No facility-administered encounter medications on file as of 07/27/2022.    Social History: Social History   Tobacco Use   Smoking status: Former  Smokeless tobacco: Never  Substance Use Topics   Alcohol use: Yes    Alcohol/week: 2.0 standard drinks of alcohol    Types: 2 Glasses of wine per week   Drug use: Yes    Types: Marijuana    Family Medical History: No family history on file.  Physical Examination: There were no vitals filed for this visit.  General: Patient is well developed, well nourished, calm, collected, and in no apparent distress. Attention to examination is appropriate.  Respiratory: Patient is breathing without any  difficulty.   NEUROLOGICAL:     Awake, alert, oriented to person, place, and time.  Speech is clear and fluent. Fund of knowledge is appropriate.   Cranial Nerves: Pupils equal round and reactive to light.  Facial tone is symmetric.    *** ROM of cervical spine *** pain *** posterior cervical tenderness. *** tenderness in bilateral trapezial region.   *** ROM of lumbar spine *** pain *** posterior lumbar tenderness.   No abnormal lesions on exposed skin.   Strength: Side Biceps Triceps Deltoid Interossei Grip Wrist Ext. Wrist Flex.  R 5 5 5 5 5 5 5   L 5 5 5 5 5 5 5    Side Iliopsoas Quads Hamstring PF DF EHL  R 5 5 5 5 5 5   L 5 5 5 5 5 5    Reflexes are ***2+ and symmetric at the biceps, triceps, brachioradialis, patella and achilles.   Hoffman's is absent.  Clonus is not present.   Bilateral upper and lower extremity sensation is intact to light touch.     Gait is normal.   ***No difficulty with tandem gait.    Medical Decision Making  Imaging: Lumbar xrays dated ***:  ***  No radiology report available for above xrays.    CT of lumbar spine dated 06/28/22:  FINDINGS: Segmentation: 5 lumbar vertebrae. The caudal most well-formed intervertebral disc space is designated L5-S1.   Alignment: 2 mm bony retropulsion at the level of the L1 superior endplate. 2 mm bony retropulsion at the level of the L2 inferior endplate   Vertebrae: L1 vertebral compression fracture (30-40% height loss) with vertically oriented fracture through the vertebral body, new from the prior lumbar spine CT of 04/07/2021 and acute in appearance. Height loss at site of an L2 inferior endplate compression fracture has progressed from the prior lumbar spine CT of 04/07/2021 (now 40-50%). This is age indeterminate, but favored chronic given the degree of sclerosis along the L2 inferior endplate. Lumbar vertebral body height is otherwise maintained. Degenerative endplate sclerosis at L4-L5 and  L5-S1.   Paraspinal and other soft tissues: Aortoiliac atherosclerosis. Punctate nonobstructing bilateral renal calculi. Distended urinary bladder, incompletely imaged. No paraspinal hematoma.   Disc levels:   Multilevel disc space narrowing, greatest at L4-L5 (advanced) and L5-S1 (moderate to advanced). Disc vacuum phenomenon at L2-L3, L4-L5 and L5-S1.   T12-L1: Mild bony retropulsion at the level of the L1 superior endplate. No significant disc herniation or stenosis.   L1-L2: No significant disc herniation or stenosis.   L2-L3: Mild bony retropulsion at the level of the L2 inferior endplate, slightly progressed. Progressive disc bulge. Facet arthrosis and ligamentum flavum hypertrophy. Right greater than left subarticular narrowing. Mild narrowing of the central canal. No significant foraminal stenosis.   L3-L4: Disc bulge. Superimposed broad-based disc protrusion spanning the central and bilateral subarticular zones. Facet arthrosis and ligamentum flavum hypertrophy. Bilateral subarticular narrowing. Moderate central canal stenosis. No significant foraminal stenosis. As before, there is a  large anterior disc extrusion near midline which contacts the aorta.   L4-L5: Disc bulge with endplate osteophytes. Superimposed moderately large central disc extrusion with mild caudal migration. Facet arthrosis and ligamentum flavum hypertrophy. Severe bilateral subarticular and central canal stenosis. Bilateral neural foraminal narrowing (moderate/severe right, mild left).   L5-S1: Disc bulge with endplate spurring. Superimposed moderately enlarged central/left subarticular disc extrusion with mild cranial migration. Facet arthrosis and ligamentum flavum hypertrophy. The disc extrusion results in severe left subarticular stenosis and likely encroaches upon the descending left S1 nerve root. Mild right subarticular stenosis. Moderate/severe bilateral neural foraminal narrowing.    IMPRESSION: 1. L1 compression fracture (30-40% height loss) with vertically-oriented fracture through the vertebral body, new from the prior lumbar spine CT of 04/07/2021 and acute in appearance. 2. L2 inferior endplate vertebral compression fracture with progressive height loss as compared to the prior lumbar spine CT (now 40-50%). This is age-indeterminate but favored chronic given the degree of sclerosis at this site. 3. Apart from mild progression of a disc bulge at L2-L3, lumbar spondylosis is unchanged from the prior exam. Findings are most notably as follows. 4. At L3-L4, there is multifactorial bilateral subarticular narrowing and moderate central canal stenosis. 5. At L4-L5, there is multifactorial severe bilateral subarticular and central canal stenosis. Moderate/severe right neural foraminal and also present at this level. 6. At L5-S1, there is multifactorial severe left subarticular stenosis. Moderate/severe bilateral neural foraminal narrowing also present at this level. 7. Bilateral nonobstructive nephrolithiasis. 8. Distended urinary bladder, incompletely imaged. 9.  Aortic Atherosclerosis (ICD10-I70.0).     Electronically Signed   By: Jackey Loge D.O.   On: 06/28/2022 18:24   Lumbar xrays dated 06/28/22:   FINDINGS: Five lumbar-type vertebral bodies. Osteopenia. Moderate disc height loss at L4-5 and L5-S1 with endplate osteophytes. Scattered osteophytes elsewhere as well. Slight dextroconvex curvature of the upper lumbar spine. Compression of the inferior endplate of L2 but progressive height loss and more sclerosis. This also new mild-to-moderate compression of L1. This could be acute. Tubal ligation clips along the pelvis.   IMPRESSION: Increasing compression of L2 with more sclerosis. New compression of L1. This could be acute. Please correlate with symptoms and if needed additional cross-sectional imaging workup.   Degenerative changes particularly at  L4-5 and L5-S1.   Osteopenia.     Electronically Signed   By: Karen Kays M.D.   On: 06/28/2022 17:10    I have personally reviewed the images and agree with the above interpretation.  Assessment and Plan: Ms. Grenda is a pleasant 52 y.o. female has ***  Treatment options discussed with patient and following plan made:   - Order for physical therapy for *** spine ***. Patient to call to schedule appointment. *** - Continue current medications including ***. Reviewed dosing and side effects.  - Prescription for ***. Reviewed dosing and side effects. Take with food.  - Prescription for *** to take prn muscle spasms. Reviewed dosing and side effects. Discussed this can cause drowsiness.  - MRI of *** to further evaluate *** radiculopathy. No improvement time or medications (***).  - Referral to PMR at [facility] to discuss possible *** injections.  - Will schedule phone visit to review MRI results once I get them back.   I spent a total of *** minutes in face-to-face and non-face-to-face activities related to this patient's care today including review of outside records, review of imaging, review of symptoms, physical exam, discussion of differential diagnosis, discussion of treatment options, and documentation.  Thank you for involving me in the care of this patient.   Drake Leach PA-C Dept. of Neurosurgery

## 2022-07-27 ENCOUNTER — Ambulatory Visit
Admission: RE | Admit: 2022-07-27 | Discharge: 2022-07-27 | Disposition: A | Discharge status: Home / Self Care | Payer: Medicaid Other | Attending: Orthopedic Surgery | Admitting: Orthopedic Surgery

## 2022-07-27 ENCOUNTER — Ambulatory Visit
Admission: RE | Admit: 2022-07-27 | Discharge: 2022-07-27 | Disposition: A | Discharge status: Home / Self Care | Payer: Medicaid Other | Source: Ambulatory Visit | Attending: Orthopedic Surgery | Admitting: Orthopedic Surgery

## 2022-07-27 ENCOUNTER — Encounter: Payer: Self-pay | Admitting: Orthopedic Surgery

## 2022-07-27 ENCOUNTER — Ambulatory Visit (INDEPENDENT_AMBULATORY_CARE_PROVIDER_SITE_OTHER): Payer: Medicaid Other | Admitting: Orthopedic Surgery

## 2022-07-27 VITALS — BP 110/70 | Ht 66.0 in | Wt 107.4 lb

## 2022-07-27 DIAGNOSIS — S32010A Wedge compression fracture of first lumbar vertebra, initial encounter for closed fracture: Secondary | ICD-10-CM | POA: Diagnosis not present

## 2022-07-27 NOTE — Patient Instructions (Addendum)
It was so nice to see you today. Thank you so much for coming in.    You have a broken bone (compression fracture) at L1 and an old fracture at L2.   I want to get an MRI of your lower back to look into things further. We will get this approved through your insurance and [facility] will call you to schedule the appointment.   I also want to get DEXA scan (bone density scan). This can be done at [facility] (upstairs from our office). You need to call [phone] to schedule this.   Call the [facility] to make an appointment for them to look at your brace to be sure it is fitting appropriately.   You need to wear the brace when you are up and walking. Do not wear to sleep. Can remove it sitting and watching TV.   Depending on results of above imaging, I may refer you to see if you can have a kyphoplasty procedure (injection of cement into fracture). Once I have the results back, we will call you to set up a phone visit with me.   You remain out of work. I have given you a note. Let me know if they need anything else.   Please do not hesitate to call if you have any questions or concerns. You can also message me in MyChart.   Drake Leach PA-C [phone]

## 2022-08-11 DIAGNOSIS — Z79899 Other long term (current) drug therapy: Secondary | ICD-10-CM | POA: Diagnosis not present

## 2022-08-11 DIAGNOSIS — Z8674 Personal history of sudden cardiac arrest: Secondary | ICD-10-CM | POA: Diagnosis not present

## 2022-08-11 DIAGNOSIS — Z7984 Long term (current) use of oral hypoglycemic drugs: Secondary | ICD-10-CM | POA: Diagnosis not present

## 2022-08-11 DIAGNOSIS — R42 Dizziness and giddiness: Secondary | ICD-10-CM | POA: Diagnosis not present

## 2022-08-11 DIAGNOSIS — Z7982 Long term (current) use of aspirin: Secondary | ICD-10-CM | POA: Diagnosis not present

## 2022-08-11 DIAGNOSIS — Z043 Encounter for examination and observation following other accident: Secondary | ICD-10-CM | POA: Diagnosis not present

## 2022-08-11 DIAGNOSIS — I10 Essential (primary) hypertension: Secondary | ICD-10-CM | POA: Diagnosis not present

## 2022-08-11 DIAGNOSIS — E119 Type 2 diabetes mellitus without complications: Secondary | ICD-10-CM | POA: Diagnosis not present

## 2022-08-11 DIAGNOSIS — M5136 Other intervertebral disc degeneration, lumbar region: Secondary | ICD-10-CM | POA: Diagnosis not present

## 2022-08-11 DIAGNOSIS — M48061 Spinal stenosis, lumbar region without neurogenic claudication: Secondary | ICD-10-CM | POA: Diagnosis not present

## 2022-08-11 DIAGNOSIS — R296 Repeated falls: Secondary | ICD-10-CM | POA: Diagnosis not present

## 2022-08-12 DIAGNOSIS — R9431 Abnormal electrocardiogram [ECG] [EKG]: Secondary | ICD-10-CM | POA: Diagnosis not present

## 2022-08-12 DIAGNOSIS — R55 Syncope and collapse: Secondary | ICD-10-CM | POA: Diagnosis not present

## 2022-08-12 DIAGNOSIS — Z043 Encounter for examination and observation following other accident: Secondary | ICD-10-CM | POA: Diagnosis not present

## 2022-08-12 DIAGNOSIS — M48061 Spinal stenosis, lumbar region without neurogenic claudication: Secondary | ICD-10-CM | POA: Diagnosis not present

## 2022-08-12 DIAGNOSIS — M5136 Other intervertebral disc degeneration, lumbar region: Secondary | ICD-10-CM | POA: Diagnosis not present

## 2022-08-17 ENCOUNTER — Ambulatory Visit
Admission: RE | Admit: 2022-08-17 | Discharge: 2022-08-17 | Disposition: A | Discharge status: Home / Self Care | Payer: Medicaid Other | Source: Ambulatory Visit | Attending: Orthopedic Surgery | Admitting: Orthopedic Surgery

## 2022-08-17 DIAGNOSIS — M810 Age-related osteoporosis without current pathological fracture: Secondary | ICD-10-CM | POA: Diagnosis not present

## 2022-08-17 DIAGNOSIS — S32010A Wedge compression fracture of first lumbar vertebra, initial encounter for closed fracture: Secondary | ICD-10-CM | POA: Insufficient documentation

## 2022-08-22 ENCOUNTER — Ambulatory Visit
Admission: RE | Admit: 2022-08-22 | Discharge: 2022-08-22 | Disposition: A | Discharge status: Home / Self Care | Payer: Medicaid Other | Source: Ambulatory Visit | Attending: Orthopedic Surgery | Admitting: Orthopedic Surgery

## 2022-08-22 DIAGNOSIS — M48061 Spinal stenosis, lumbar region without neurogenic claudication: Secondary | ICD-10-CM | POA: Diagnosis not present

## 2022-08-22 DIAGNOSIS — S32029A Unspecified fracture of second lumbar vertebra, initial encounter for closed fracture: Secondary | ICD-10-CM | POA: Diagnosis not present

## 2022-08-22 DIAGNOSIS — M5126 Other intervertebral disc displacement, lumbar region: Secondary | ICD-10-CM | POA: Diagnosis not present

## 2022-08-22 DIAGNOSIS — R609 Edema, unspecified: Secondary | ICD-10-CM | POA: Diagnosis not present

## 2022-08-22 DIAGNOSIS — S32010A Wedge compression fracture of first lumbar vertebra, initial encounter for closed fracture: Secondary | ICD-10-CM | POA: Diagnosis not present

## 2022-08-31 ENCOUNTER — Ambulatory Visit (INDEPENDENT_AMBULATORY_CARE_PROVIDER_SITE_OTHER): Payer: Medicaid Other | Admitting: Orthopedic Surgery

## 2022-08-31 ENCOUNTER — Encounter: Payer: Self-pay | Admitting: Orthopedic Surgery

## 2022-08-31 DIAGNOSIS — S32010D Wedge compression fracture of first lumbar vertebra, subsequent encounter for fracture with routine healing: Secondary | ICD-10-CM

## 2022-08-31 DIAGNOSIS — S32010A Wedge compression fracture of first lumbar vertebra, initial encounter for closed fracture: Secondary | ICD-10-CM

## 2022-08-31 DIAGNOSIS — M810 Age-related osteoporosis without current pathological fracture: Secondary | ICD-10-CM | POA: Diagnosis not present

## 2022-08-31 NOTE — Progress Notes (Signed)
++  Telephone Visit- Progress Note: Referring Physician:  No referring provider defined for this encounter.  Primary Physician:  Emogene Morgan, MD  This visit was performed via telephone.  Patient location: home Provider location: office  I spent a total of 10 minutes non-face-to-face activities for this visit on the date of this encounter including review of current clinical condition and response to treatment.    Patient has given verbal consent to this telephone visits and we reviewed the limitations of a telephone visit. Patient wishes to proceed.    Chief Complaint:  review MRI/DEXA results  History of Present Illness: Miel Wisener is a 52 y.o. female has a history of HTN, CAD, and DM.    Seen in ED on 08/11/22 for presyncopal symptoms/fall. Had CT scan done showing known L1 compression fracture.   She feels that her LBP has improved slightly since her last visit. She still has more constant LBP that is worse with changing positions and using stairs. She has no leg pain. No numbness, tingling, or weakness. Legs feel shaky at times when she does stairs.    She is not wearing her brace- has not called [facility] to have them look at it.   She is working on getting her blood sugars under control.    Bowel/Bladder Dysfunction: none   Conservative measures:  Physical therapy: has not participated in Multimodal medical therapy including regular antiinflammatories: percocet   Injections: has not received epidural steroid injections   Past Surgery: denies  Exam: No exam done as this was a telephone encounter.     Imaging: MRI of lumbar spine dated 08/22/22:  FINDINGS: Segmentation:  Standard.   Alignment:  Physiologic.   Vertebrae: Nonacute but unhealed L1 compression fracture with progressive and advanced height loss and diffuse marrow edema. Posterior and superior corner has retropulsed by 4 mm.   Edematous signal at the tips of the T12 and L1 spinous processes which  is likely reactive.   L2 inferior endplate fracture with moderate depression. Mild superimposed edema is likely reactive at the site of chronic fracturing.   Mild reactive appearing edema at the T12 anterior inferior endplate.   No evidence of aggressive bone lesion or infection.   Conus medullaris and cauda equina: Conus extends to the L1-2 level. Conus and cauda equina appear normal.   Paraspinal and other soft tissues: Mild perivertebral edema at the level of L1 fracture.   Disc levels:   T12- L1: Posttraumatic distortion of the disc.   L1-L2: Unremarkable.   L2-L3: Posttraumatic distortion of the disc with mild desiccation and bulging.   L3-L4: Central disc protrusion causing moderate thecal sac stenosis. Negative facets and patent foramina   L4-L5: Disc collapse with central herniation causing moderate spinal stenosis and impacting the right more than left L5 nerve roots at the subarticular recesses. The foramina are patent   L5-S1:Disc collapse with left eccentric protrusion impinging on the left S1 nerve root. Disc height loss and endplate ridging causes mild to moderate foraminal narrowing on the left.   IMPRESSION: 1. Nonacute but unhealed L1 compression fracture with progressed and advanced height loss since 06/28/2022. There is also been retropulsion of the posterosuperior corner mild indentation of the ventral cord. 2. Mild edema in the T12, L1 spinous processes and T12 body which is likely reactive. 3. Remote L2 inferior endplate fracture. 4. 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 At L4-5 there is asymmetric impingement of the right  L5 nerve root the subarticular recess.     Electronically Signed   By: Tiburcio Pea M.D.   On: 08/30/2022 05:47   CT scan of lumbar spine dated 08/11/22:  FINDINGS:   Subacute compression fracture of the L1 vertebral body with near complete compression  of the mid vertebral body and osseous retropulsion causing at least moderate spinal canal narrowing (3:19). There is moderate degree of multilevel degenerative disc disease, consisting of loss of intervertebral disc space, marginal osteophytosis, subchondral sclerosis, and vacuum disc phenomenon, greatest at L4-L5. Multiple Schmorl nodes seen, greatest at the inferior endplate of L2. Level facet degeneration. Mild focal anterior kyphosis at L1-L2. Mild dextroscoliosis of the thoracolumbar junction.  The vertebrae are normally aligned. No paravertebral soft tissue abnormality. Dense vascular calcifications of the aorta.  IMPRESSION: Severe compression deformity of L1 with near complete loss of vertebral body height and osseous retropulsion with at least moderate spinal canal narrowing at this level.  ==================== MODIFIED REPORT: (08/12/2022 6:24 AM) This report has been modified from its preliminary version; you may check the prior versions of radiology report, results history link for prior report versions (if they were previously visible in Epic).  ----------------------------------------------- Exam End: 08/12/22 00:51   Specimen Collected: 08/12/22 01:06 Last Resulted: 08/12/22 06:24  Received From: [facility]  Result Received: 08/12/22 15:29    I have personally reviewed the images and agree with the above interpretation. Above MRI reviewed with Dr. Myer Haff as well.   DEXA scan dated 08/17/22:  Shows osteoporosis.   Assessment and Plan: Ms. Marshman is a pleasant 52 y.o. female felt a pop in her back on 06/28/22 and was found to have acute L1 compression fracture and chronic L2 compression fracture.   She notes some improvement in pain since last visit, but she still has constant LBP with no leg pain. Pain is worse with changing positions and stairs. She is not wearing her brace.   MRI shows progression of L1 fracture with slight retropulsion. DEXA scan shows osteoporosis.     Treatment options discussed with patient and following plan made:   - Referral to IR to consider L1 kyphoplasty is okay per Dr. Myer Haff.  - I still recommend she wear her TLSO brace when up and walking. No bending, twisting, or lifting.  - Discussed with her that DEXA showed osteoporosis. She will follow up with PCP Letta Pate) about this. I have also sent him a letter.   Drake Leach PA-C Neurosurgery

## 2022-08-31 NOTE — Progress Notes (Signed)
Lumbar MRI dated 08/22/22:  FINDINGS: Segmentation:  Standard.   Alignment:  Physiologic.   Vertebrae: Nonacute but unhealed L1 compression fracture with progressive and advanced height loss and diffuse marrow edema. Posterior and superior corner has retropulsed by 4 mm.   Edematous signal at the tips of the T12 and L1 spinous processes which is likely reactive.   L2 inferior endplate fracture with moderate depression. Mild superimposed edema is likely reactive at the site of chronic fracturing.   Mild reactive appearing edema at the T12 anterior inferior endplate.   No evidence of aggressive bone lesion or infection.   Conus medullaris and cauda equina: Conus extends to the L1-2 level. Conus and cauda equina appear normal.   Paraspinal and other soft tissues: Mild perivertebral edema at the level of L1 fracture.   Disc levels:   T12- L1: Posttraumatic distortion of the disc.   L1-L2: Unremarkable.   L2-L3: Posttraumatic distortion of the disc with mild desiccation and bulging.   L3-L4: Central disc protrusion causing moderate thecal sac stenosis. Negative facets and patent foramina   L4-L5: Disc collapse with central herniation causing moderate spinal stenosis and impacting the right more than left L5 nerve roots at the subarticular recesses. The foramina are patent   L5-S1:Disc collapse with left eccentric protrusion impinging on the left S1 nerve root. Disc height loss and endplate ridging causes mild to moderate foraminal narrowing on the left.   IMPRESSION: 1. Nonacute but unhealed L1 compression fracture with progressed and advanced height loss since 06/28/2022. There is also been retropulsion of the posterosuperior corner mild indentation of the ventral cord. 2. Mild edema in the T12, L1 spinous processes and T12 body which is likely reactive. 3. Remote L2 inferior endplate fracture. 4. Premature lumbar spine degeneration with herniations causing moderate  spinal stenosis at L3-4 and L4-5. At L4-5 there is asymmetric impingement of the right L5 nerve root the subarticular recess.     Electronically Signed   By: Tiburcio Pea M.D.   On: 08/30/2022 05:47  I have personally reviewed the images and agree with the above interpretation.   DEXA scan dated 08/17/22:  Shows osteoporosis.   Reviewed lumbar MRI with Dr. Myer Haff. He thinks she is still a candidate for kyphoplasty. Will need to have her follow up with PCP for treatment of osteoporosis.   Will discuss with patient during phone visit to review MRI and DEXA.

## 2022-09-02 ENCOUNTER — Other Ambulatory Visit: Payer: Self-pay

## 2022-09-02 ENCOUNTER — Inpatient Hospital Stay
Admission: RE | Admit: 2022-09-02 | Discharge: 2022-09-02 | Disposition: A | Discharge status: Home / Self Care | Payer: Self-pay | Source: Ambulatory Visit | Attending: Orthopedic Surgery | Admitting: Orthopedic Surgery

## 2022-09-02 DIAGNOSIS — Z049 Encounter for examination and observation for unspecified reason: Secondary | ICD-10-CM

## 2022-09-20 ENCOUNTER — Other Ambulatory Visit: Payer: Self-pay | Admitting: Orthopedic Surgery

## 2022-09-20 ENCOUNTER — Ambulatory Visit
Admission: RE | Admit: 2022-09-20 | Discharge: 2022-09-20 | Disposition: A | Discharge status: Home / Self Care | Payer: Medicaid Other | Source: Ambulatory Visit | Attending: Orthopedic Surgery | Admitting: Orthopedic Surgery

## 2022-09-20 DIAGNOSIS — M8008XA Age-related osteoporosis with current pathological fracture, vertebra(e), initial encounter for fracture: Secondary | ICD-10-CM | POA: Diagnosis not present

## 2022-09-20 DIAGNOSIS — S32010A Wedge compression fracture of first lumbar vertebra, initial encounter for closed fracture: Secondary | ICD-10-CM

## 2022-09-20 HISTORY — PX: IR RADIOLOGIST EVAL & MGMT: IMG5224

## 2022-09-20 LAB — CBC
HCT: 43.5 % (ref 35.0–45.0)
Hemoglobin: 14.5 g/dL (ref 11.7–15.5)
MCH: 34.1 pg — ABNORMAL HIGH (ref 27.0–33.0)
MCHC: 33.3 g/dL (ref 32.0–36.0)
MCV: 102.4 fL — ABNORMAL HIGH (ref 80.0–100.0)
MPV: 10.4 fL (ref 7.5–12.5)
Platelets: 246 10*3/uL (ref 140–400)
RBC: 4.25 10*6/uL (ref 3.80–5.10)
RDW: 11.9 % (ref 11.0–15.0)
WBC: 6.5 10*3/uL (ref 3.8–10.8)

## 2022-09-20 NOTE — H&P (Signed)
Interventional Radiology - Clinic Visit, Initial H&P    Referring Provider: Drake Leach, PA-C  Reason for Visit: L1 compression fracture     History of Present Illness  Claudia Mills is a 52 y.o. female with a relevant past medical history of osteoporosis (DEXA 08/17/2022) seen today in Interventional Radiology clinic for back pain related to L1 compression fracture.  The patient reports acute onset of lower back pain on Jun 28, 2022 following a low force mechanism injury while she was shifting out of her car seat.  The pain was severe enough resulting in having to call an ambulance and be evaluated in the emergency room that day.  She was prescribed a lumbar brace that day.  She was then evaluated in the neurosurgery clinic on July 27, 2022, and most recently August 31, 2022.  Given her persistent back pain, she was referred to the interventional radiology clinic for consideration of kyphoplasty candidacy.    She reports constant lower back pain that she rates 8/10 most of the time.  It was particularly worse when she is going up stairs or changing positions.  She has been prescribed oxycodone/Percocet for pain relief.  She has moderate relief with prescription pain medication to 4/10, and does not complain of any significant side effects related to the prescription pain medication.  She has been given a lumbar brace, but has been unable to use it significantly due to reported weight loss and poor fit, resulting in the brace moving upwards and "choking her."  She rates significant disability on the [employer] disability questionnaire with 19/24 positive.    Her most recent imaging is an MRI lumbar spine performed on August 22, 2022.  This demonstrates a subacute compression fracture of the L1 vertebral body with advanced greater than 50% height loss.  There has been progression of height loss when compared to her CT of the lumbar spine performed on Jun 28, 2022.    Medication history significant for  daily aspirin.    Additional Past Medical History Past Medical History:  Diagnosis Date   Diabetes mellitus without complication (HCC)    Hypertension    MI (myocardial infarction) [facility])      Surgical History  Past Surgical History:  Procedure Laterality Date   CORONARY ANGIOPLASTY WITH STENT PLACEMENT       Medications  I have reviewed the current medication list. Refer to chart for details. Current Outpatient Medications  Medication Instructions   aspirin EC 81 mg, Oral, Daily, Swallow whole.   atorvastatin (LIPITOR) 80 mg, Oral, Daily   DULoxetine (CYMBALTA) 30 mg, Oral, Daily   Empagliflozin (JARDIANCE PO) Oral   gabapentin (NEURONTIN) 300 mg, Oral, 3 times daily   glipiZIDE (GLUCOTROL) 5 mg, Oral, Daily before breakfast   lisinopril (ZESTRIL) 5 mg, Oral, Daily   metoprolol succinate (TOPROL XL) 100 mg, Oral, Daily, Take with or immediately following a meal.   nitroGLYCERIN (NITROSTAT) 0.3 mg, Sublingual, Every 5 min PRN   ondansetron (ZOFRAN-ODT) 4 mg, Oral, Every 8 hours PRN   oxyCODONE-acetaminophen (PERCOCET) 5-325 MG tablet 1 tablet, Oral, Every 4 hours PRN      Allergies Allergies  Allergen Reactions   Bee Venom Anaphylaxis   Does patient have contrast allergy: No     Physical Exam Current Vitals   ( )                    There is no height or weight on file to calculate BMI.  General:  Alert and answers questions appropriately.  HEENT: Normocephalic, atraumatic. Conjunctivae normal without scleral icterus. Cardiac: Regular rate and rhythm. No dependent edema. Pulmonary: Normal work of breathing. On room air. Back: TTP in lower back.    Pertinent Lab Results    Latest Ref Rng & Units 06/28/2022    9:43 AM 09/20/2021    6:49 PM 12/30/2015    8:48 PM  CBC  WBC 4.0 - 10.5 K/uL 10.5  6.3  9.4   Hemoglobin 12.0 - 15.0 g/dL 16.1  09.6  04.5   Hematocrit 36.0 - 46.0 % 45.1  40.0  46.2   Platelets 150 - 400 K/uL 218  211  191       Latest Ref  Rng & Units 06/28/2022    9:43 AM 09/20/2021    6:49 PM 12/30/2015    8:48 PM  CMP  Glucose 70 - 99 mg/dL 409  811  914   BUN 6 - 20 mg/dL 8  6  6    Creatinine 0.44 - 1.00 mg/dL 7.82  9.56  2.13   Sodium 135 - 145 mmol/L 134  132  134   Potassium 3.5 - 5.1 mmol/L 3.9  3.6  3.5   Chloride 98 - 111 mmol/L 100  98  97   CO2 22 - 32 mmol/L 21  24  25    Calcium 8.9 - 10.3 mg/dL 9.3  8.9  9.3       Relevant and/or Recent Imaging: MRI L spine 08/22/2022  IMPRESSION: 1. Nonacute but unhealed L1 compression fracture with progressed and advanced height loss since 06/28/2022. There is also been retropulsion of the posterosuperior corner mild indentation of the ventral cord. 2. Mild edema in the T12, L1 spinous processes and T12 body which is likely reactive. 3. Remote L2 inferior endplate fracture. 4. 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 At L4-5 there is asymmetric impingement of the right L5 nerve root the subarticular recess. Electronically Signed By: Tiburcio Pea M.D. On: 08/30/2022 05:47      Assessment & Plan:   Patient has suffered subacute osteoporotic fracture of the L1 vertebra. Recent MRI shows progressive >50% height loss of the L1 vertebral body.    History and exam have demonstrated the following:  Acute/Subacute fracture by imaging dated 08/22/2022, Pain on exam concordant with level of fracture, Failure of conservative therapy including inability to wear back brace and persistent pain despite narcotic pain use, and Significant disability on the [employer] Disability Questionnaire with 19/24 positive symptoms, reflecting significant impact/impairment of (ADLs)    ICD-10-CM Codes that Support Medical Necessity ([email or web address].at.[email or web address]?[email or web address])  M80.08XA    Age-related osteoporosis with current pathological fracture, vertebra(e), initial encounter for  fracture  S32.010A    Wedge compression fracture of first lumbar vertebra, initial encounter for closed fracture    Plan:  L1 vertebral body augmentation with balloon kyphoplasty  Post-procedure disposition: outpatient DRI-A  Medication holds: Aspirin   The patient has suffered a fracture of the L1 vertebral body. It is recommended that patients aged 50 years or older be evaluated for possible testing or treatment of osteoporosis. A copy of this consult report is sent to the patient's referring physician.  Advanced Care Plan: The patient did not want to provide an Advanced Care Plan at the time of this visit     Total time spent on today's visit was over 60 Minutes, including both face-to-face time and non face-to-face time, personally spent on review of  chart (including labs and relevant imaging), discussing further workup and treatment options, referral to specialist if needed, reviewing outside records if pertinent, answering patient questions, and coordinating care regarding L1 compression fracture as well as management strategy.        Olive Bass, MD  Vascular and Interventional Radiology 09/20/2022 9:22 AM

## 2022-09-26 ENCOUNTER — Telehealth: Payer: Self-pay | Admitting: Orthopedic Surgery

## 2022-09-26 NOTE — Telephone Encounter (Signed)
Looks like she is being scheduled for kyphoplasty with IR.   Have her let me know when this is scheduled and will plan to keep her out for 2 weeks after the procedure.

## 2022-09-26 NOTE — Telephone Encounter (Signed)
Pt called stating that Claudia Mills wrote her work note on 07/27/2022 keeping her out of work until 09/28/2022. Pt is wanting to know if we can extend this as she isnt sure she can return to work. CB: [phone]

## 2022-09-26 NOTE — Telephone Encounter (Signed)
This appointment has not been scheduled yet. She has to see her Cardiologist to get a clearance to stop ASA, ticagrelor. Then she will be scheduled for the procedure.

## 2022-09-26 NOTE — Telephone Encounter (Signed)
I can do a note keeping her out for another 3 weeks and we can revisit it once she has kyphoplasty.   Note done. Please let her know I sent it to her MyChart.

## 2022-09-26 NOTE — Telephone Encounter (Signed)
Patient states she is not as mobile as she was before the accident. She is having a hard time walking distance like 6 houses down the road. Her right leg is what hurts. She states bending and twisting is the worse part. She works at [employer] and she has to bend down to pick up a tray of sock and fold the sock and twist to put them in a box. Lift the box off the table and start another box.

## 2022-09-26 NOTE — Telephone Encounter (Signed)
Patient notified and voiced understanding.

## 2022-09-27 DIAGNOSIS — Z0130 Encounter for examination of blood pressure without abnormal findings: Secondary | ICD-10-CM | POA: Diagnosis not present

## 2022-09-27 DIAGNOSIS — M4856XD Collapsed vertebra, not elsewhere classified, lumbar region, subsequent encounter for fracture with routine healing: Secondary | ICD-10-CM | POA: Diagnosis not present

## 2022-09-27 DIAGNOSIS — Z1389 Encounter for screening for other disorder: Secondary | ICD-10-CM | POA: Diagnosis not present

## 2022-09-27 DIAGNOSIS — Z0131 Encounter for examination of blood pressure with abnormal findings: Secondary | ICD-10-CM | POA: Diagnosis not present

## 2022-09-29 DIAGNOSIS — I10 Essential (primary) hypertension: Secondary | ICD-10-CM | POA: Diagnosis not present

## 2022-09-29 DIAGNOSIS — I214 Non-ST elevation (NSTEMI) myocardial infarction: Secondary | ICD-10-CM | POA: Diagnosis not present

## 2022-09-29 DIAGNOSIS — E7800 Pure hypercholesterolemia, unspecified: Secondary | ICD-10-CM | POA: Diagnosis not present

## 2022-09-29 DIAGNOSIS — E119 Type 2 diabetes mellitus without complications: Secondary | ICD-10-CM | POA: Diagnosis not present

## 2022-09-30 ENCOUNTER — Other Ambulatory Visit: Payer: Self-pay | Admitting: Orthopedic Surgery

## 2022-09-30 DIAGNOSIS — M8008XA Age-related osteoporosis with current pathological fracture, vertebra(e), initial encounter for fracture: Secondary | ICD-10-CM

## 2022-09-30 DIAGNOSIS — S32010A Wedge compression fracture of first lumbar vertebra, initial encounter for closed fracture: Secondary | ICD-10-CM

## 2022-10-05 MED ORDER — MIDAZOLAM HCL 2 MG/2ML IJ SOLN
1.0000 mg | INTRAMUSCULAR | Status: DC | PRN
  Administered 2022-10-06 (×2): 1 mg via INTRAVENOUS

## 2022-10-05 MED ORDER — FENTANYL CITRATE PF 50 MCG/ML IJ SOSY
25.0000 ug | PREFILLED_SYRINGE | INTRAMUSCULAR | Status: DC | PRN
  Administered 2022-10-06: 50 ug via INTRAVENOUS
  Administered 2022-10-06: 25 ug via INTRAVENOUS

## 2022-10-05 NOTE — Discharge Instructions (Signed)
Kyphoplasty Post Procedure Discharge Instructions  May resume a regular diet and any medications that you routinely take (including pain medications). However, if you are taking Aspirin or an anticoagulant/blood thinner you will be told when you can resume taking these by the healthcare provider. No driving day of procedure. The day of your procedure take it easy. You may use an ice pack as needed to injection sites on back.  Ice to back 30 minutes on and 30 minutes off, as needed. May remove bandaids tomorrow after taking a shower. Replace daily with a clean bandaid until healed.  Do not lift anything heavier than a milk jug for 1-2 weeks or determined by your physician.  Follow up with your physician in 2 weeks.    Please contact our office at [phone] for the following symptoms or if you have any questions:  Fever greater than 100 degrees Increased swelling, pain, or redness at injection site. Increased back and/or leg pain New numbness or change in symptoms from before the procedure.    Thank you for visiting [facility].  You may resume your Brilinta 24 hours after your procedure.

## 2022-10-06 ENCOUNTER — Ambulatory Visit
Admission: RE | Admit: 2022-10-06 | Discharge: 2022-10-06 | Disposition: A | Discharge status: Home / Self Care | Payer: Medicaid Other | Source: Ambulatory Visit | Attending: Orthopedic Surgery | Admitting: Orthopedic Surgery

## 2022-10-06 DIAGNOSIS — M8008XA Age-related osteoporosis with current pathological fracture, vertebra(e), initial encounter for fracture: Secondary | ICD-10-CM

## 2022-10-06 DIAGNOSIS — M4856XA Collapsed vertebra, not elsewhere classified, lumbar region, initial encounter for fracture: Secondary | ICD-10-CM | POA: Diagnosis not present

## 2022-10-06 DIAGNOSIS — S32010A Wedge compression fracture of first lumbar vertebra, initial encounter for closed fracture: Secondary | ICD-10-CM

## 2022-10-06 HISTORY — PX: IR KYPHO LUMBAR INC FX REDUCE BONE BX UNI/BIL CANNULATION INC/IMAGING: IMG5519

## 2022-10-06 MED ORDER — SODIUM CHLORIDE 0.9 % IV SOLN: INTRAVENOUS | Status: DC

## 2022-10-06 MED ORDER — CEFAZOLIN SODIUM-DEXTROSE 2-4 GM/100ML-% IV SOLN
2.0000 g | INTRAVENOUS | Status: AC
  Administered 2022-10-06: 2 g via INTRAVENOUS

## 2022-10-06 MED ORDER — KETOROLAC TROMETHAMINE 30 MG/ML IJ SOLN
30.0000 mg | Freq: Once | INTRAMUSCULAR | Status: AC
  Administered 2022-10-06: 30 mg via INTRAVENOUS

## 2022-10-06 NOTE — Progress Notes (Signed)
Pt back in nursing recovery area. Pt still drowsy from procedure but will wake up when spoken to. Pt follows commands, talks in complete sentences and has no complaints at this time. Pt will remain in nurses station until discharged by Radiologist.   

## 2022-10-12 ENCOUNTER — Encounter: Payer: Self-pay | Admitting: *Deleted

## 2022-10-12 ENCOUNTER — Other Ambulatory Visit: Payer: Self-pay

## 2022-10-12 ENCOUNTER — Emergency Department
Admission: EM | Admit: 2022-10-12 | Discharge: 2022-10-12 | Disposition: A | Discharge status: Home / Self Care | Payer: Medicaid Other | Attending: Emergency Medicine | Admitting: Emergency Medicine

## 2022-10-12 DIAGNOSIS — W1849XA Other slipping, tripping and stumbling without falling, initial encounter: Secondary | ICD-10-CM | POA: Diagnosis not present

## 2022-10-12 DIAGNOSIS — M549 Dorsalgia, unspecified: Secondary | ICD-10-CM | POA: Diagnosis present

## 2022-10-12 DIAGNOSIS — S39012A Strain of muscle, fascia and tendon of lower back, initial encounter: Secondary | ICD-10-CM | POA: Insufficient documentation

## 2022-10-12 MED ORDER — KETOROLAC TROMETHAMINE 15 MG/ML IJ SOLN
15.0000 mg | Freq: Once | INTRAMUSCULAR | Status: AC
  Administered 2022-10-12: 15 mg via INTRAMUSCULAR
  Filled 2022-10-12: qty 1

## 2022-10-12 MED ORDER — DEXAMETHASONE SODIUM PHOSPHATE 10 MG/ML IJ SOLN
10.0000 mg | Freq: Once | INTRAMUSCULAR | Status: AC
  Administered 2022-10-12: 10 mg via INTRAMUSCULAR
  Filled 2022-10-12: qty 1

## 2022-10-12 NOTE — Discharge Instructions (Signed)
Your evaluated in the ED today for lower back pain following being over your cat.  Physical exam is reassuring.  Continue to follow-up with your neurosurgeon who performed your kyphoplasty procedure tomorrow at your scheduled appointment.  Take ibuprofen for pain as needed.

## 2022-10-12 NOTE — ED Triage Notes (Signed)
Pt ambulatory to triage.  Pt states she tripped over the cat yesterday but did not fall.  Pt has mid back pain.  Pt has compression fx.   Pt reports having a back procedure last week.  Pt alert  speech clear.

## 2022-10-12 NOTE — ED Provider Notes (Signed)
[facility] Emergency Department Provider Note     Event Date/Time   First MD Initiated Contact with Patient 10/12/22 1843     (approximate)   History   Back Pain   HPI  Claudia Mills is a 52 y.o. female with a history of compression fracture presents to the emergency department with complaint of back pain x 1 day.  Patient reports she accidentally tripped over her cat.  Denies falling and making contact to the floor. She reports she did not immediately feel pain but gradually throughout the day felt a dull like pain to the mid area of her back.  Patient reports kyphoplasty procedure last Thursday in which is the reason she comes to the ED today.  Denies numbness, leg weakness and fever.    Physical Exam   Triage Vital Signs: ED Triage Vitals  Encounter Vitals Group     BP 10/12/22 1832 (!) 179/90     Systolic BP Percentile --      Diastolic BP Percentile --      Pulse Rate 10/12/22 1832 88     Resp 10/12/22 1832 20     Temp 10/12/22 1832 98.5 F (36.9 C)     Temp Source 10/12/22 1832 Oral     SpO2 10/12/22 1832 100 %     Weight 10/12/22 1833 120 lb (54.4 kg)     Height 10/12/22 1833 5\' 3"  (1.6 m)     Head Circumference --      Peak Flow --      Pain Score 10/12/22 1832 10     Pain Loc --      Pain Education --      Exclude from Growth Chart --     Most recent vital signs: Vitals:   10/12/22 1832  BP: (!) 179/90  Pulse: 88  Resp: 20  Temp: 98.5 F (36.9 C)  SpO2: 100%    General: Alert and oriented. INAD.      Head:  NCAT.  Neck:   No cervical spine tenderness to palpation.  CV:  Good peripheral perfusion. RRR.  RESP:  Normal effort. LCTAB.    BACK:  Spinous process is midline without deformity or tenderness.  There is a gauze dressing over L1 midline.  Tenderness to palpation over L5 paraspinal muscle region.  MSK:   Full ROM in all joints. No swelling, deformity or tenderness.  NEURO: Cranial nerves intact. No focal deficits.  Sensation and motor function intact. 5/5 UE and LE muscle strength.    ED Results / Procedures / Treatments   Labs (all labs ordered are listed, but only abnormal results are displayed) Labs Reviewed - No data to display  No results found.  PROCEDURES:  Critical Care performed: No  Procedures   MEDICATIONS ORDERED IN ED: Medications  ketorolac (TORADOL) 15 MG/ML injection 15 mg (15 mg Intramuscular Given 10/12/22 1938)  dexamethasone (DECADRON) injection 10 mg (10 mg Intramuscular Given 10/12/22 1938)     IMPRESSION / MDM / ASSESSMENT AND PLAN / ED COURSE  I reviewed the triage vital signs and the nursing notes.                              Clinical Course as of 10/13/22 0131  Wed Oct 12, 2022  2011 Patient reports improvement in symptoms. [MH]    Clinical Course User Index [MH] Kern Reap A, PA-C    52 y.o.  female presents to the emergency department for evaluation and treatment of lower back pain. See HPI for further details.   Differential diagnosis includes, but is not limited to fracture, dislocation, muscle strain, cauda equina.  Patient's presentation is most consistent with acute complicated illness / injury requiring diagnostic workup.  Given patient's history stated above, presentation is clinically consistent with a muscle strain.  Imaging was considered given her procedure on 08/22 for disruption, but physical exam findings showed tenderness to region of L5 muscles rather than vertebrae and not where procedure was performed.  I am reassured given patient has a scheduled appointment with her neurosurgeon tomorrow for follow-up.  Pain management in the ED with Toradol and steroids improved patient's symptoms.  This is reassuring.  Patient is advised to take ibuprofen as needed for her pain and is encouraged to make her appointment.  Patient is in stable condition and is ready for discharge home. Patient is given ED precautions to return to the ED for any  worsening or new symptoms. Patient verbalizes understanding. All questions and concerns were addressed during ED visit.     FINAL CLINICAL IMPRESSION(S) / ED DIAGNOSES   Final diagnoses:  Strain of lumbar region, initial encounter   Rx / DC Orders   ED Discharge Orders     None        Note:  This document was prepared using Dragon voice recognition software and may include unintentional dictation errors.    Romeo Apple, Keyri Salberg A, PA-C 10/13/22 Kathrynn Humble    Corena Herter, MD 10/24/22 1620

## 2022-10-13 ENCOUNTER — Telehealth: Payer: Self-pay

## 2022-10-13 ENCOUNTER — Ambulatory Visit
Admission: RE | Admit: 2022-10-13 | Discharge: 2022-10-13 | Disposition: A | Discharge status: Home / Self Care | Payer: Medicaid Other | Source: Ambulatory Visit | Attending: Interventional Radiology | Admitting: Interventional Radiology

## 2022-10-13 ENCOUNTER — Other Ambulatory Visit: Payer: Self-pay | Admitting: Interventional Radiology

## 2022-10-13 DIAGNOSIS — S32010S Wedge compression fracture of first lumbar vertebra, sequela: Secondary | ICD-10-CM

## 2022-10-13 DIAGNOSIS — M545 Low back pain, unspecified: Secondary | ICD-10-CM | POA: Diagnosis not present

## 2022-10-13 HISTORY — PX: IR RADIOLOGIST EVAL & MGMT: IMG5224

## 2022-10-13 MED ORDER — METHOCARBAMOL 750 MG PO TABS: 750.0000 mg | ORAL_TABLET | Freq: Three times a day (TID) | ORAL | 0 refills | Status: DC

## 2022-10-13 NOTE — Telephone Encounter (Signed)
Dr. Archer Asa requested for patient to try robaxin over the weekend to see if that would help with some of her pain. Pt. Made aware that if her pain has continued through Tuesday to give [place] a call back and we would do an MRI on her.

## 2022-10-13 NOTE — Progress Notes (Signed)
Chief Complaint: Patient was seen in consultation today for low back pain at the request of Reginal Wojcicki K  Referring Physician(s): Yasir Kitner K  History of Present Illness: Claudia Mills is a 52 y.o. female who was recently treated by my partner, Dr. Juliette Alcide, last week for a symptomatic osteoporotic fracture of the L1 vertebral body.  She was feeling fantastic the first several days following the procedure.  She states that she felt "like a new woman".  Unfortunately, on Tuesday of this week she stumbled over her cat having to catch herself abruptly.  She developed new onset of severe lower back pain slightly more inferior to the site of her recent kyphoplasty.  She went to the emergency department yesterday where she was treated with a steroid injection and tramadol.  This improved her pain temporarily.  She states that no imaging was performed at that time.  Today, her pain is persistent.  I ordered lumbar spine radiographs and reviewed them myself.  I see no convincing evidence of a new acute compression fracture although conventional radiography is somewhat limited.  Her L1 kyphoplasty site looks perfect.  The chronic changes at L2 are stable.  Past Medical History:  Diagnosis Date   Diabetes mellitus without complication (HCC)    Hypertension    MI (myocardial infarction) (HCC)     Past Surgical History:  Procedure Laterality Date   CORONARY ANGIOPLASTY WITH STENT PLACEMENT     IR KYPHO LUMBAR INC FX REDUCE BONE BX UNI/BIL CANNULATION INC/IMAGING  10/06/2022   IR RADIOLOGIST EVAL & MGMT  09/20/2022   IR RADIOLOGIST EVAL & MGMT  10/13/2022    Allergies: Bee venom  Medications: Prior to Admission medications   Medication Sig Start Date End Date Taking? Authorizing Provider  aspirin EC 81 MG tablet Take 81 mg by mouth daily. Swallow whole.    [provider]  atorvastatin (LIPITOR) 80 MG tablet Take 1 tablet (80 mg total) by mouth daily. Patient not taking:  Reported on 09/20/2022 02/11/21 07/27/22  Gilles Chiquito, MD  DULoxetine (CYMBALTA) 30 MG capsule Take 30 mg by mouth daily.    [provider]  Empagliflozin (JARDIANCE PO) Take by mouth.    [provider]  gabapentin (NEURONTIN) 300 MG capsule Take 300 mg by mouth 3 (three) times daily.    [provider]  glipiZIDE (GLUCOTROL) 5 MG tablet Take 1 tablet (5 mg total) by mouth daily before breakfast. 12/31/15 09/20/22  Darci Current, MD  lisinopril (ZESTRIL) 5 MG tablet Take 1 tablet (5 mg total) by mouth daily. 02/11/21 09/20/22  Gilles Chiquito, MD  methocarbamol (ROBAXIN-750) 750 MG tablet Take 1 tablet (750 mg total) by mouth 3 (three) times daily. 10/13/22   Sterling Big, MD  metoprolol succinate (TOPROL XL) 100 MG 24 hr tablet Take 1 tablet (100 mg total) by mouth daily. Take with or immediately following a meal. 02/11/21 09/20/22  Gilles Chiquito, MD  nitroGLYCERIN (NITROSTAT) 0.3 MG SL tablet Place 0.3 mg under the tongue every 5 (five) minutes as needed for chest pain.    [provider]  ondansetron (ZOFRAN-ODT) 4 MG disintegrating tablet Take 1 tablet (4 mg total) by mouth every 8 (eight) hours as needed for nausea or vomiting. 06/28/22   Jene Every, MD  oxyCODONE-acetaminophen (PERCOCET) 5-325 MG tablet Take 1 tablet by mouth every 4 (four) hours as needed for severe pain. Patient not taking: Reported on 09/20/2022 06/28/22 06/28/23  Chesley Noon, MD  No family history on file.  Social History   Socioeconomic History   Marital status: Married    Spouse name: Not on file   Number of children: Not on file   Years of education: Not on file   Highest education level: Not on file  Occupational History   Not on file  Tobacco Use   Smoking status: Former   Smokeless tobacco: Never  Substance and Sexual Activity   Alcohol use: Yes    Alcohol/week: 2.0 standard drinks of alcohol    Types: 2 Glasses of wine per week   Drug use: Yes     Types: Marijuana   Sexual activity: Not on file  Other Topics Concern   Not on file  Social History Narrative   Not on file   Social Determinants of Health   Financial Resource Strain: Not on file  Food Insecurity: No Food Insecurity (01/31/2019)   Received from [facility]   Hunger Vital Sign    Worried About Running Out of Food in the Last Year: Never true    Ran Out of Food in the Last Year: Never true  Transportation Needs: Unmet Transportation Needs (07/01/2022)   PRAPARE - Administrator, Civil Service (Medical): Yes    Lack of Transportation (Non-Medical): No  Physical Activity: Not on file  Stress: Not on file  Social Connections: Not on file    Review of Systems: A 12 point ROS discussed and pertinent positives are indicated in the HPI above.  All other systems are negative.  Review of Systems  Vital Signs: LMP 09/29/2015 (Approximate) Comment: neg. preg test    Physical Exam Constitutional:      General: She is not in acute distress.    Appearance: Normal appearance. She is normal weight.  HENT:     Head: Normocephalic and atraumatic.  Eyes:     General: No scleral icterus. Cardiovascular:     Rate and Rhythm: Normal rate.  Pulmonary:     Effort: Pulmonary effort is normal.  Abdominal:     General: Abdomen is flat.     Palpations: Abdomen is soft.  Musculoskeletal:        General: No swelling, tenderness or deformity.  Skin:    General: Skin is warm and dry.  Neurological:     Mental Status: She is alert and oriented to person, place, and time.  Psychiatric:        Mood and Affect: Mood normal.        Behavior: Behavior normal.       Imaging: DG Lumbar Spine 2-3 Views  Result Date: 10/13/2022 CLINICAL DATA:  52 year old female with acute recurrence of back pain after tripping on her cat EXAM: LUMBAR SPINE - 2-3 VIEW COMPARISON:  Recent prior kyphoplasty 10/06/2022; CT scan of the lumbar spine 09/02/2022; MRI of  the lumbar spine 08/22/2022 FINDINGS: Stable postoperative changes of L1 cement augmentation. Chronic compression fracture with Schmorl's node of the L2 vertebral body appears similar compared to prior. No definite acute fracture or malalignment. Vertebral body heights are otherwise maintained. Degenerative changes are again noted at L4-L5 and L5-S1. Bilateral lower lumbar facet arthropathy. Atherosclerotic calcifications present in the abdominal aorta. Tubal ligation clips noted on the frontal view. IMPRESSION: 1. No convincing evidence of acute fracture by conventional x-ray. 2. Stable postoperative changes of L1 cement augmentation. 3. Stable chronic compression fracture with Schmorl's node deformity at L2. Electronically Signed   By: Malachy Moan  M.D.   On: 10/13/2022 14:30   IR Radiologist Eval & Mgmt  Result Date: 10/13/2022 EXAM: ESTABLISHED PATIENT OFFICE VISIT CHIEF COMPLAINT: SEE EPIC NOTE HISTORY OF PRESENT ILLNESS: SEE EPIC NOTE REVIEW OF SYSTEMS: SEE EPIC NOTE PHYSICAL EXAMINATION: SEE EPIC NOTE ASSESSMENT AND PLAN: SEE EPIC NOTE Electronically Signed   By: Malachy Moan M.D.   On: 10/13/2022 14:27   IR KYPHO LUMBAR INC FX REDUCE BONE BX UNI/BIL CANNULATION INC/IMAGING  Result Date: 10/06/2022 INDICATION: L1 compression fracture EXAM: L1 vertebral body augmentation using balloon kyphoplasty COMPARISON:  None Available. MEDICATIONS: Documented in the EMR ANESTHESIA/SEDATION: Moderate (conscious) sedation was employed during this procedure. A total of Versed 2 mg and Fentanyl 75 mcg was administered intravenously by the radiology nurse. Total intra-service moderate Sedation Time: 27 minutes. The patient's level of consciousness and vital signs were monitored continuously by radiology nursing throughout the procedure under my direct supervision. FLUOROSCOPY: Radiation Exposure Index (as provided by the fluoroscopic device): 6.0 minutes (55 mGy) COMPLICATIONS: None immediate. PROCEDURE:  Informed written consent was obtained from the patient after a thorough discussion of the procedural risks, benefits and alternatives. All questions were addressed. Maximal Sterile Barrier Technique was utilized including caps, mask, sterile gowns, sterile gloves, sterile drape, hand hygiene and skin antiseptic. A timeout was performed prior to the initiation of the procedure. The patient was positioned prone on the exam table. A unipedicular access was planned. Skin entry site was marked overlying the L1 vertebral body using fluoroscopy. The overlying skin was then prepped and draped in the standard sterile fashion. Local analgesia was obtained with 1% lidocaine. Attention was turned to the right side. Under fluoroscopic guidance, a 10 gauge introducer needle was advanced towards the lateral margin of the pedicle. Using multiple projections, the introducer needle was advanced towards the posterior margin of the vertebral body via a transpedicular approach. The inner needle was then removed, and the drill was advanced towards the anterior margin of the vertebral body. A Kyphon 15 mm inflatable bone tamp was then advanced through the transpedicular access needle and positioned within the mid vertebral body. Kyphoplasty was then performed, ensuring that the balloon contours stayed within the vertebral body margins. The balloon was then deflated and removed, followed by advancement of the bone filler device and the instillation of acrylic bone cement with excellent filling in the AP and lateral projections. No extravasation was noted in the disk spaces or posteriorly into the spinal canal. No epidural venous contamination was seen. At the end of the procedure, the introducer cannula and bone filler device were removed without difficulty. A clean dressing was placed after hemostasis. The patient tolerated all aspects of the procedure well, and was transferred to recovery in stable condition. IMPRESSION: 1. Successful L1  vertebral body augmentation using balloon kyphoplasty. If the patient has known osteoporosis, recommend treatment as clinically indicated. If the patient's bone density status is unknown, DEXA scan is recommended. Electronically Signed   By: Olive Bass M.D.   On: 10/06/2022 10:52   IR Radiologist Eval & Mgmt  Result Date: 09/20/2022 EXAM: NEW PATIENT OFFICE VISIT CHIEF COMPLAINT: Refer to EMR HISTORY OF PRESENT ILLNESS: The patient reports acute onset of lower back pain on Jun 28, 2022 following a low force mechanism injury while she was shifting out of her car seat. The pain was severe enough resulting in having to call an ambulance and be evaluated in the emergency room that day. She was prescribed a lumbar brace that day. She was  then evaluated in the neurosurgery clinic on July 27, 2022, and most recently August 31, 2022. Given her persistent back pain, she was referred to the interventional radiology clinic for consideration of kyphoplasty candidacy. She reports constant lower back pain that she rates 8/10 most of the time. It was particularly worse when she is going up stairs or changing positions. She has been prescribed oxycodone/Percocet for pain relief. She has moderate relief with prescription pain medication to 4/10, and does not complain of any significant side effects related to the prescription pain medication. She has been given a lumbar brace, but has been unable to use it significantly due to reported weight loss and poor fit, resulting in the brace moving upwards and "choking her." She rates significant disability on the [employer] disability questionnaire with 19/24 positive. Her most recent imaging is an MRI lumbar spine performed on August 22, 2022. This demonstrates a subacute compression fracture of the L1 vertebral body with advanced greater than 50% height loss. There has been progression of height loss when compared to her CT of the lumbar spine performed on Jun 28, 2022. Medication  history significant for daily aspirin. REVIEW OF SYSTEMS: Refer to EMR PHYSICAL EXAMINATION: Refer to EMR ASSESSMENT AND PLAN: Refer to EMR Electronically Signed   By: Olive Bass M.D.   On: 09/20/2022 09:32    Labs:  CBC: Recent Labs    06/28/22 0943 09/20/22 0930  WBC 10.5 6.5  HGB 15.4* 14.5  HCT 45.1 43.5  PLT 218 246    COAGS: Recent Labs    09/20/22 0930  INR 0.9    BMP: Recent Labs    06/28/22 0943 09/20/22 0930  NA 134* 138  K 3.9 3.9  CL 100 102  CO2 21* 24  GLUCOSE 369* 246*  BUN 8 6*  CALCIUM 9.3 9.6  CREATININE 0.52 0.58  GFRNONAA >60  --     LIVER FUNCTION TESTS: Recent Labs    09/20/22 0930  BILITOT 0.5  AST 9*  ALT 5*  PROT 7.1    TUMOR MARKERS: No results for input(s): "AFPTM", "CEA", "CA199", "CHROMGRNA" in the last 8760 hours.  Assessment and Plan:  Pleasant 52 year old female with recurrent low back pain after tripping on her cat the day before yesterday.  No evidence of acute fracture on lumbar spine radiographs.  Her symptoms seem to be more consistent with muscle strain and spasm.  We will treat conservatively with a muscle relaxer.  1.) Methocarbamol 750 mg PRN every 8 hrs as needed for pain/spasm.  2.)  Rest and heat as well over the weekend. 3.)  Patient to reach out to [place] on Tuesday if her pain is persistent or worsening.  At that point, she would likely need a repeat MRI to assess for new occult fracture.  Electronically Signed: Sterling Big 10/13/2022, 2:33 PM   I spent a total of  15 Minutes in face to face in clinical consultation, greater than 50% of which was counseling/coordinating care for back pain

## 2022-10-14 ENCOUNTER — Telehealth: Payer: Self-pay

## 2022-10-14 NOTE — Telephone Encounter (Signed)
Phone call to pt to follow up from her kyphoplasty on 10/06/22. Pt reports her pain had gotten a lot better until previously tripping over a cat that has caused some new back pain. Pt reports she is able to move around a little better. Pt denies any signs of infection, redness at the site, draining or fever. Pt. Was given an appt. To come in today to follow up since she is having pain since tripping over the cat. Pt advised to call back if anything were to change or any concerns arise and we will arrange an in person appointment. Pt verbalized understanding.

## 2022-10-18 ENCOUNTER — Emergency Department: Payer: Medicaid Other

## 2022-10-18 ENCOUNTER — Observation Stay
Admission: EM | Admit: 2022-10-18 | Discharge: 2022-10-19 | Disposition: A | Discharge status: Home / Self Care | Payer: Medicaid Other | Attending: Internal Medicine | Admitting: Internal Medicine

## 2022-10-18 ENCOUNTER — Other Ambulatory Visit: Payer: Self-pay

## 2022-10-18 ENCOUNTER — Other Ambulatory Visit: Payer: Self-pay | Admitting: Interventional Radiology

## 2022-10-18 DIAGNOSIS — Z7982 Long term (current) use of aspirin: Secondary | ICD-10-CM | POA: Diagnosis not present

## 2022-10-18 DIAGNOSIS — E860 Dehydration: Secondary | ICD-10-CM

## 2022-10-18 DIAGNOSIS — R112 Nausea with vomiting, unspecified: Secondary | ICD-10-CM | POA: Diagnosis present

## 2022-10-18 DIAGNOSIS — Z7984 Long term (current) use of oral hypoglycemic drugs: Secondary | ICD-10-CM | POA: Insufficient documentation

## 2022-10-18 DIAGNOSIS — R140 Abdominal distension (gaseous): Secondary | ICD-10-CM | POA: Diagnosis not present

## 2022-10-18 DIAGNOSIS — M8008XA Age-related osteoporosis with current pathological fracture, vertebra(e), initial encounter for fracture: Secondary | ICD-10-CM

## 2022-10-18 DIAGNOSIS — R109 Unspecified abdominal pain: Secondary | ICD-10-CM | POA: Diagnosis not present

## 2022-10-18 DIAGNOSIS — I10 Essential (primary) hypertension: Secondary | ICD-10-CM | POA: Diagnosis not present

## 2022-10-18 DIAGNOSIS — E1165 Type 2 diabetes mellitus with hyperglycemia: Secondary | ICD-10-CM | POA: Diagnosis not present

## 2022-10-18 DIAGNOSIS — R1032 Left lower quadrant pain: Secondary | ICD-10-CM | POA: Insufficient documentation

## 2022-10-18 DIAGNOSIS — R739 Hyperglycemia, unspecified: Secondary | ICD-10-CM

## 2022-10-18 DIAGNOSIS — Z79899 Other long term (current) drug therapy: Secondary | ICD-10-CM | POA: Insufficient documentation

## 2022-10-18 DIAGNOSIS — Z87891 Personal history of nicotine dependence: Secondary | ICD-10-CM | POA: Insufficient documentation

## 2022-10-18 DIAGNOSIS — Z955 Presence of coronary angioplasty implant and graft: Secondary | ICD-10-CM | POA: Insufficient documentation

## 2022-10-18 DIAGNOSIS — R000 Tachycardia, unspecified: Secondary | ICD-10-CM | POA: Diagnosis not present

## 2022-10-18 DIAGNOSIS — N200 Calculus of kidney: Secondary | ICD-10-CM | POA: Diagnosis not present

## 2022-10-18 DIAGNOSIS — E1110 Type 2 diabetes mellitus with ketoacidosis without coma: Secondary | ICD-10-CM | POA: Diagnosis present

## 2022-10-18 DIAGNOSIS — R1114 Bilious vomiting: Secondary | ICD-10-CM | POA: Diagnosis not present

## 2022-10-18 DIAGNOSIS — S32010A Wedge compression fracture of first lumbar vertebra, initial encounter for closed fracture: Secondary | ICD-10-CM

## 2022-10-18 LAB — LACTIC ACID, PLASMA: Lactic Acid, Venous: 1.8 mmol/L (ref 0.5–1.9)

## 2022-10-18 LAB — COMPREHENSIVE METABOLIC PANEL
ALT: 12 U/L (ref 0–44)
AST: 17 U/L (ref 15–41)
Albumin: 4.6 g/dL (ref 3.5–5.0)
Alkaline Phosphatase: 126 U/L (ref 38–126)
Anion gap: 25 — ABNORMAL HIGH (ref 5–15)
BUN: 12 mg/dL (ref 6–20)
CO2: 17 mmol/L — ABNORMAL LOW (ref 22–32)
Calcium: 10 mg/dL (ref 8.9–10.3)
Chloride: 95 mmol/L — ABNORMAL LOW (ref 98–111)
Creatinine, Ser: 0.65 mg/dL (ref 0.44–1.00)
GFR, Estimated: >60 mL/min (ref >60)
Glucose, Bld: 479 mg/dL — ABNORMAL HIGH (ref 70–99)
Potassium: 3.3 mmol/L — ABNORMAL LOW (ref 3.5–5.1)
Sodium: 137 mmol/L (ref 135–145)
Total Bilirubin: 1.4 mg/dL — ABNORMAL HIGH (ref 0.3–1.2)
Total Protein: 8.5 g/dL — ABNORMAL HIGH (ref 6.5–8.1)

## 2022-10-18 LAB — URINALYSIS, ROUTINE W REFLEX MICROSCOPIC
Bacteria, UA: NONE SEEN
Bilirubin Urine: NEGATIVE
Glucose, UA: >=500 mg/dL — AB
Ketones, ur: 80 mg/dL — AB
Nitrite: NEGATIVE
Protein, ur: 100 mg/dL — AB
Specific Gravity, Urine: 1.028 (ref 1.005–1.030)
pH: 6 (ref 5.0–8.0)

## 2022-10-18 LAB — CBC
HCT: 44.6 % (ref 36.0–46.0)
Hemoglobin: 15.7 g/dL — ABNORMAL HIGH (ref 12.0–15.0)
MCH: 34.2 pg — ABNORMAL HIGH (ref 26.0–34.0)
MCHC: 35.2 g/dL (ref 30.0–36.0)
MCV: 97.2 fL (ref 80.0–100.0)
Platelets: 377 10*3/uL (ref 150–400)
RBC: 4.59 MIL/uL (ref 3.87–5.11)
RDW: 11.5 % (ref 11.5–15.5)
WBC: 17.1 10*3/uL — ABNORMAL HIGH (ref 4.0–10.5)
nRBC: 0 % (ref 0.0–0.2)

## 2022-10-18 LAB — BASIC METABOLIC PANEL
Anion gap: 14 (ref 5–15)
BUN: 8 mg/dL (ref 6–20)
CO2: 24 mmol/L (ref 22–32)
Calcium: 8.8 mg/dL — ABNORMAL LOW (ref 8.9–10.3)
Chloride: 98 mmol/L (ref 98–111)
Creatinine, Ser: 0.5 mg/dL (ref 0.44–1.00)
GFR, Estimated: >60 mL/min (ref >60)
Glucose, Bld: 232 mg/dL — ABNORMAL HIGH (ref 70–99)
Potassium: 2.8 mmol/L — ABNORMAL LOW (ref 3.5–5.1)
Sodium: 136 mmol/L (ref 135–145)

## 2022-10-18 LAB — CBG MONITORING, ED
Glucose-Capillary: 414 mg/dL — ABNORMAL HIGH (ref 70–99)
Glucose-Capillary: 221 mg/dL — ABNORMAL HIGH (ref 70–99)

## 2022-10-18 LAB — LIPASE, BLOOD: Lipase: 55 U/L — ABNORMAL HIGH (ref 11–51)

## 2022-10-18 LAB — BETA-HYDROXYBUTYRIC ACID: Beta-Hydroxybutyric Acid: 2.12 mmol/L — ABNORMAL HIGH (ref 0.05–0.27)

## 2022-10-18 MED ORDER — MORPHINE SULFATE (PF) 4 MG/ML IV SOLN
4.0000 mg | Freq: Once | INTRAVENOUS | Status: AC
  Administered 2022-10-18: 4 mg via INTRAVENOUS
  Filled 2022-10-18: qty 1

## 2022-10-18 MED ORDER — ONDANSETRON HCL 4 MG/2ML IJ SOLN
4.0000 mg | Freq: Once | INTRAMUSCULAR | Status: AC
  Administered 2022-10-18: 4 mg via INTRAVENOUS
  Filled 2022-10-18: qty 2

## 2022-10-18 MED ORDER — LACTATED RINGERS IV BOLUS
1000.0000 mL | Freq: Once | INTRAVENOUS | Status: AC
  Administered 2022-10-18: 1000 mL via INTRAVENOUS

## 2022-10-18 MED ORDER — SODIUM CHLORIDE 0.9 % IV BOLUS
1000.0000 mL | Freq: Once | INTRAVENOUS | Status: AC
  Administered 2022-10-18: 1000 mL via INTRAVENOUS

## 2022-10-18 MED ORDER — METOCLOPRAMIDE HCL 5 MG/ML IJ SOLN
10.0000 mg | Freq: Once | INTRAMUSCULAR | Status: AC
  Administered 2022-10-18: 10 mg via INTRAVENOUS
  Filled 2022-10-18: qty 2

## 2022-10-18 MED ORDER — IOHEXOL 300 MG/ML  SOLN
100.0000 mL | Freq: Once | INTRAMUSCULAR | Status: AC | PRN
  Administered 2022-10-18: 100 mL via INTRAVENOUS

## 2022-10-18 MED ORDER — DEXTROSE 50 % IV SOLN: 0.0000 mL | INTRAVENOUS | Status: DC | PRN

## 2022-10-18 MED ORDER — LACTATED RINGERS IV SOLN: INTRAVENOUS | Status: DC

## 2022-10-18 MED ORDER — INSULIN REGULAR(HUMAN) IN NACL 100-0.9 UT/100ML-% IV SOLN: INTRAVENOUS | Status: DC

## 2022-10-18 MED ORDER — HEPARIN SODIUM (PORCINE) 5000 UNIT/ML IJ SOLN
5000.0000 [IU] | Freq: Three times a day (TID) | INTRAMUSCULAR | Status: DC
  Administered 2022-10-18 – 2022-10-19 (×2): 5000 [IU] via SUBCUTANEOUS
  Filled 2022-10-18 (×2): qty 1

## 2022-10-18 MED ORDER — LISINOPRIL 10 MG PO TABS
10.0000 mg | ORAL_TABLET | Freq: Once | ORAL | Status: AC
  Administered 2022-10-18: 10 mg via ORAL
  Filled 2022-10-18: qty 1

## 2022-10-18 MED ORDER — DEXTROSE IN LACTATED RINGERS 5 % IV SOLN: INTRAVENOUS | Status: DC

## 2022-10-18 MED ORDER — INSULIN ASPART 100 UNIT/ML IJ SOLN
10.0000 [IU] | Freq: Once | INTRAMUSCULAR | Status: AC
  Administered 2022-10-18: 10 [IU] via INTRAVENOUS
  Filled 2022-10-18: qty 1

## 2022-10-18 MED ORDER — PANTOPRAZOLE SODIUM 40 MG IV SOLR
40.0000 mg | Freq: Two times a day (BID) | INTRAVENOUS | Status: DC
  Administered 2022-10-18 – 2022-10-19 (×2): 40 mg via INTRAVENOUS
  Filled 2022-10-18 (×2): qty 10

## 2022-10-18 NOTE — H&P (Addendum)
History and Physical    Patient: Claudia Mills RUE:[id number] DOB: 1970/06/09 DOA: 10/18/2022 DOS: the patient was seen and examined on 10/19/2022 PCP: Emogene Morgan, MD  Patient coming from: Home   Chief Complaint:  Chief Complaint  Patient presents with   Emesis   Abdominal Pain    HPI: Claudia Mills is a 52 y.o. female with medical history significant for Dm II, Htn, MI coming for N/V and abdominal pain since past few days. Pt does not smoke cigarette.  No fever chills or weight loss.  And reports some low back pain from her compression fracture.  Denies any alcohol abuse except for occasional alcohol only.  In the emergency room patient is alert awake oriented afebrile but hypertensive with Htn 210/112.  EKG shows Sinus tach at 113, biatrial enlargement, significant Q waves in leads V1 and V2 concerning for septal infarct, prolonged QTc at 534. Initial blood work showed potassium of 3.3 bicarb of 17 glucose 479 anion gap of 25 lipase mildly elevated at 55 total protein 8.5 total bili 1.4.  Leukocytosis of 17.1 hemoglobin of 15.7 normal platelets at 377.  Elevated beta hydroxy at 2.12, normal lactic at 1.8. In the emergency room patient was given 10 units of IV regular insulin.  Admission requested for suspected DKA.  Review of Systems: Review of Systems  Constitutional:  Positive for malaise/fatigue.  Gastrointestinal:  Positive for abdominal pain, nausea and vomiting.  Neurological:  Positive for weakness.    Past Medical History:  Diagnosis Date   Diabetes mellitus without complication (HCC)    Hypertension    MI (myocardial infarction) (HCC)    Past Surgical History:  Procedure Laterality Date   CORONARY ANGIOPLASTY WITH STENT PLACEMENT     IR KYPHO LUMBAR INC FX REDUCE BONE BX UNI/BIL CANNULATION INC/IMAGING  10/06/2022   IR RADIOLOGIST EVAL & MGMT  09/20/2022   IR RADIOLOGIST EVAL & MGMT  10/13/2022   Social History:   reports that she has quit smoking. She has never used  smokeless tobacco. She reports current alcohol use of about 2.0 standard drinks of alcohol per week. She reports current drug use. Drug: Marijuana.  Allergies  Allergen Reactions   Bee Venom Anaphylaxis    History reviewed. No pertinent family history.  Prior to Admission medications   Medication Sig Start Date End Date Taking? Authorizing Provider  aspirin EC 81 MG tablet Take 81 mg by mouth daily. Swallow whole.    [provider]  atorvastatin (LIPITOR) 80 MG tablet Take 1 tablet (80 mg total) by mouth daily. Patient not taking: Reported on 09/20/2022 02/11/21 07/27/22  Gilles Chiquito, MD  DULoxetine (CYMBALTA) 30 MG capsule Take 30 mg by mouth daily.    [provider]  Empagliflozin (JARDIANCE PO) Take by mouth.    [provider]  gabapentin (NEURONTIN) 300 MG capsule Take 300 mg by mouth 3 (three) times daily.    [provider]  glipiZIDE (GLUCOTROL) 5 MG tablet Take 1 tablet (5 mg total) by mouth daily before breakfast. 12/31/15 09/20/22  Darci Current, MD  lisinopril (ZESTRIL) 5 MG tablet Take 1 tablet (5 mg total) by mouth daily. 02/11/21 09/20/22  Gilles Chiquito, MD  methocarbamol (ROBAXIN-750) 750 MG tablet Take 1 tablet (750 mg total) by mouth 3 (three) times daily. 10/13/22   Sterling Big, MD  metoprolol succinate (TOPROL XL) 100 MG 24 hr tablet Take 1 tablet (100 mg total) by mouth daily. Take with or immediately following  a meal. 02/11/21 09/20/22  Gilles Chiquito, MD  nitroGLYCERIN (NITROSTAT) 0.3 MG SL tablet Place 0.3 mg under the tongue every 5 (five) minutes as needed for chest pain.    [provider]  ondansetron (ZOFRAN-ODT) 4 MG disintegrating tablet Take 1 tablet (4 mg total) by mouth every 8 (eight) hours as needed for nausea or vomiting. 06/28/22   Jene Every, MD  oxyCODONE-acetaminophen (PERCOCET) 5-325 MG tablet Take 1 tablet by mouth every 4 (four) hours as needed for severe pain. Patient not taking:  Reported on 09/20/2022 06/28/22 06/28/23  Chesley Noon, MD     Vitals:   10/19/22 0000 10/19/22 0030 10/19/22 0100 10/19/22 0130  BP: (!) 162/68 (!) 149/76 (!) 155/69 (!) 153/82  Pulse: (!) 101 (!) 108 (!) 103 (!) 106  Resp: 18 19 18  (!) 23  Temp:  98.2 F (36.8 C)    TempSrc:      SpO2: 100% 98% 99% 99%  Weight:      Height:       Physical Exam Vitals and nursing note reviewed.  Constitutional:      General: She is not in acute distress.    Appearance: She is underweight.  HENT:     Head: Normocephalic and atraumatic.     Right Ear: Hearing and external ear normal.     Left Ear: Hearing and external ear normal.     Nose: Nose normal. No nasal deformity.     Mouth/Throat:     Lips: Pink.     Mouth: Mucous membranes are dry.     Tongue: No lesions.  Eyes:     General: Lids are normal.     Extraocular Movements: Extraocular movements intact.  Cardiovascular:     Rate and Rhythm: Normal rate and regular rhythm.     Heart sounds: Normal heart sounds.  Pulmonary:     Effort: Pulmonary effort is normal.     Breath sounds: Normal breath sounds.  Abdominal:     General: Bowel sounds are normal. There is no distension.     Palpations: Abdomen is soft. There is no mass.     Tenderness: There is generalized abdominal tenderness.  Musculoskeletal:     Right lower leg: No edema.     Left lower leg: No edema.  Skin:    General: Skin is warm.  Neurological:     General: No focal deficit present.     Mental Status: She is alert and oriented to person, place, and time.     Cranial Nerves: Cranial nerves 2-12 are intact.  Psychiatric:        Attention and Perception: Attention normal.        Mood and Affect: Mood normal.        Speech: Speech normal.        Behavior: Behavior normal. Behavior is cooperative.     Labs on Admission: I have personally reviewed following labs and imaging studies  CBC: Recent Labs  Lab 10/18/22 1648  WBC 17.1*  HGB 15.7*  HCT 44.6  MCV  97.2  PLT 377   Basic Metabolic Panel: Recent Labs  Lab 10/18/22 1648 10/18/22 2232  NA 137 136  K 3.3* 2.8*  CL 95* 98  CO2 17* 24  GLUCOSE 479* 232*  BUN 12 8  CREATININE 0.65 0.50  CALCIUM 10.0 8.8*   GFR: Estimated Creatinine Clearance: 68 mL/min (by C-G formula based on SCr of 0.5 mg/dL). Liver Function Tests: Recent Labs  Lab 10/18/22  1648  AST 17  ALT 12  ALKPHOS 126  BILITOT 1.4*  PROT 8.5*  ALBUMIN 4.6   Recent Labs  Lab 10/18/22 1648  LIPASE 55*   No results for input(s): "AMMONIA" in the last 168 hours. Coagulation Profile: No results for input(s): "INR", "PROTIME" in the last 168 hours. Cardiac Enzymes: No results for input(s): "CKTOTAL", "CKMB", "CKMBINDEX", "TROPONINI" in the last 168 hours. BNP (last 3 results) No results for input(s): "PROBNP" in the last 8760 hours. HbA1C: No results for input(s): "HGBA1C" in the last 72 hours. CBG: Recent Labs  Lab 10/18/22 2059 10/18/22 2227  GLUCAP 414* 221*   Lipid Profile: No results for input(s): "CHOL", "HDL", "LDLCALC", "TRIG", "CHOLHDL", "LDLDIRECT" in the last 72 hours. Thyroid Function Tests: No results for input(s): "TSH", "T4TOTAL", "FREET4", "T3FREE", "THYROIDAB" in the last 72 hours. Anemia Panel: No results for input(s): "VITAMINB12", "FOLATE", "FERRITIN", "TIBC", "IRON", "RETICCTPCT" in the last 72 hours. Urinalysis    Component Value Date/Time   COLORURINE STRAW (A) 10/18/2022 1648   APPEARANCEUR CLEAR (A) 10/18/2022 1648   APPEARANCEUR Clear 04/23/2012 1141   LABSPEC 1.028 10/18/2022 1648   LABSPEC 1.015 04/23/2012 1141   PHURINE 6.0 10/18/2022 1648   GLUCOSEU >=500 (A) 10/18/2022 1648   GLUCOSEU >=500 04/23/2012 1141   HGBUR SMALL (A) 10/18/2022 1648   BILIRUBINUR NEGATIVE 10/18/2022 1648   BILIRUBINUR Negative 04/23/2012 1141   KETONESUR 80 (A) 10/18/2022 1648   PROTEINUR 100 (A) 10/18/2022 1648   NITRITE NEGATIVE 10/18/2022 1648   LEUKOCYTESUR TRACE (A) 10/18/2022 1648    LEUKOCYTESUR Negative 04/23/2012 1141   Unresulted Labs (From admission, onward)     Start     Ordered   10/19/22 0225  Magnesium  Add-on,   AD        10/19/22 0224   10/19/22 0225  Phosphorus  Add-on,   AD        10/19/22 0224   10/18/22 2205  HIV Antibody (routine testing w rflx)  (HIV Antibody (Routine testing w reflex) panel)  Once,   R        10/18/22 2206   10/18/22 2205  Basic metabolic panel  (Diabetes Ketoacidosis (DKA))  STAT Now then every 4 hours ,   STAT      10/18/22 2206   10/18/22 2205  Beta-hydroxybutyric acid  (Diabetes Ketoacidosis (DKA))  Now then every 8 hours,   URGENT      10/18/22 2206   10/18/22 2205  Hemoglobin A1c  (Diabetes Ketoacidosis (DKA))  Once,   R       Comments: To assess prior glycemic control.    10/18/22 2206            Medications  dextrose 5 % in lactated ringers infusion (0 mLs Intravenous Hold 10/18/22 2220)  dextrose 50 % solution 0-50 mL (has no administration in time range)  heparin injection 5,000 Units (5,000 Units Subcutaneous Given 10/18/22 2308)  lactated ringers infusion ( Intravenous New Bag/Given 10/18/22 2306)  pantoprazole (PROTONIX) injection 40 mg (40 mg Intravenous Given 10/18/22 2229)  potassium chloride 10 mEq in 100 mL IVPB (10 mEq Intravenous New Bag/Given 10/19/22 0242)  metoprolol succinate (TOPROL-XL) 24 hr tablet 100 mg (has no administration in time range)  gabapentin (NEURONTIN) capsule 300 mg (has no administration in time range)  DULoxetine (CYMBALTA) DR capsule 30 mg (has no administration in time range)  aspirin EC tablet 81 mg (has no administration in time range)  hydrALAZINE (APRESOLINE) injection 5 mg (has no  administration in time range)  morphine (PF) 4 MG/ML injection 4 mg (4 mg Intravenous Given 10/18/22 1742)  ondansetron (ZOFRAN) injection 4 mg (4 mg Intravenous Given 10/18/22 1742)  sodium chloride 0.9 % bolus 1,000 mL (0 mLs Intravenous Stopped 10/18/22 2217)  iohexol (OMNIPAQUE) 300 MG/ML solution 100 mL  (100 mLs Intravenous Contrast Given 10/18/22 1805)  lisinopril (ZESTRIL) tablet 10 mg (10 mg Oral Given 10/18/22 1815)  lactated ringers bolus 1,000 mL (0 mLs Intravenous Stopped 10/18/22 2210)  metoCLOPramide (REGLAN) injection 10 mg (10 mg Intravenous Given 10/18/22 2016)  insulin aspart (novoLOG) injection 10 Units (10 Units Intravenous Given 10/18/22 2059)    Radiological Exams on Admission: CT ABDOMEN PELVIS W CONTRAST  Result Date: 10/18/2022 CLINICAL DATA:  Abdominal pain, acute, nonlocalized EXAM: CT ABDOMEN AND PELVIS WITH CONTRAST TECHNIQUE: Multidetector CT imaging of the abdomen and pelvis was performed using the standard protocol following bolus administration of intravenous contrast. RADIATION DOSE REDUCTION: This exam was performed according to the departmental dose-optimization program which includes automated exposure control, adjustment of the mA and/or kV according to patient size and/or use of iterative reconstruction technique. CONTRAST:  OMNIPAQUE IOHEXOL 300 MG/ML  SOLN COMPARISON:  12/30/2015 FINDINGS: Lower chest: No acute abnormality Hepatobiliary: No focal hepatic abnormality. Gallbladder unremarkable. Pancreas: No focal abnormality or ductal dilatation. Spleen: Benign calcification in the spleen.  Normal size. Adrenals/Urinary Tract: No suspicious renal or adrenal lesion. Small nonobstructing stone in the lower pole of the right kidney. No ureteral stones or hydronephrosis. Urinary bladder unremarkable. Stomach/Bowel: Mild distention of the stomach. Large and small bowel decompressed, unremarkable. Normal appendix. Vascular/Lymphatic: Severe aortoiliac atherosclerosis, advanced for age. No aneurysm or adenopathy. Reproductive: Uterus and adnexa unremarkable. No mass. Bilateral tubal ligation clips. Other: No free fluid or free air. Musculoskeletal: No acute bony abnormality. Severe compression fracture at L1 with vertebroplasty changes. Mild compression fracture through the inferior  endplate of L2, unchanged since prior MRI. IMPRESSION: No acute findings in the abdomen or pelvis. Right lower pole nephrolithiasis.  No hydronephrosis. Severe diffuse aortoiliac atherosclerosis, advanced for age. Moderately distended stomach with fluid. Electronically Signed   By: Charlett Nose M.D.   On: 10/18/2022 19:58     Data Reviewed: Relevant notes from primary care and specialist visits, past discharge summaries as available in EHR, including Care Everywhere. Prior diagnostic testing as pertinent to current admission diagnoses Updated medications and problem lists for reconciliation ED course, including vitals, labs, imaging, treatment and response to treatment Triage notes, nursing and pharmacy notes and ED provider's notes Notable results as noted in HPI  Assessment and Plan: * Nausea and vomiting Patient presenting with nausea and vomiting as attributed to acidosis and her underlying diabetic ketoacidosis with elevated anion gap.  Patient denies any alcohol abuse.   Supportive care with IV fluids, antiemetics as needed as patient has prolonged QTc and was given Protonix for her nausea.    DKA, type 2, not at goal [facility]) Patient given 10 units of insulin in the emergency room.  Beta hydroxy elevated and was admitted stat to progressive unit and started on DKA protocol. Later patient's gap was closed and was started on high-dose sliding scale insulin regimen.  A1c is pending.   Essential hypertension Vitals:   10/18/22 1647 10/18/22 1747 10/18/22 1815 10/18/22 2102  BP: (!) 210/112 (!) 207/89 (!) 202/102 (!) 160/72   10/18/22 2200 10/18/22 2330 10/19/22 0000 10/19/22 0030  BP: (!) 154/72 (!) 162/78 (!) 162/68 (!) 149/76   10/19/22 0100 10/19/22  0130  BP: (!) 155/69 (!) 153/82  Will continue patient on metoprolol, as needed hydralazine.    DVT prophylaxis:  Heparin   Consults:  None   Advance Care Planning:    Code Status: Full Code   Family Communication:  None    Disposition Plan:  Home   Severity of Illness: The appropriate patient status for this patient is OBSERVATION. Observation status is judged to be reasonable and necessary in order to provide the required intensity of service to ensure the patient's safety. The patient's presenting symptoms, physical exam findings, and initial radiographic and laboratory data in the context of their medical condition is felt to place them at decreased risk for further clinical deterioration. Furthermore, it is anticipated that the patient will be medically stable for discharge from the hospital within 2 midnights of admission.   Author: Gertha Calkin, MD 10/19/2022 2:46 AM  For on call review [email or web address].

## 2022-10-18 NOTE — ED Triage Notes (Signed)
Pt to ED for emesis and lower abdominal pain since early this AM, states only able to keep gingerale down. Pt asking for drink, informed not at this time.  Has gallbladder and appendix. Also c/o chronic mid- lower back pain from compression fx.

## 2022-10-18 NOTE — ED Notes (Signed)
Pt care taken, started fluids, md to come and evaluate for insulin drip.

## 2022-10-18 NOTE — ED Provider Notes (Signed)
[facility] Provider Note    Event Date/Time   First MD Initiated Contact with Patient 10/18/22 1727     (approximate)  History   Chief Complaint: Emesis and Abdominal Pain  HPI  Claudia Mills is a 52 y.o. female with a past medical history of diabetes, hypertension, presents the emergency department for nausea vomiting left lower quadrant abdominal pain.  According to the patient since this morning she has been nauseated with frequent episodes of vomiting and has been experiencing pain in the left lower quadrant of her abdomen.  Denies any diarrhea.  No fever.  No chest pain or shortness of breath.  Patient states she feels very dehydrated.  Denies any urinary symptoms.  Physical Exam   Triage Vital Signs: ED Triage Vitals  Encounter Vitals Group     BP 10/18/22 1647 (!) 210/112     Systolic BP Percentile --      Diastolic BP Percentile --      Pulse Rate 10/18/22 1647 (!) 113     Resp 10/18/22 1647 18     Temp 10/18/22 1647 97.7 F (36.5 C)     Temp src --      SpO2 10/18/22 1647 99 %     Weight 10/18/22 1648 119 lb 14.9 oz (54.4 kg)     Height 10/18/22 1648 5\' 3"  (1.6 m)     Head Circumference --      Peak Flow --      Pain Score 10/18/22 1644 5     Pain Loc --      Pain Education --      Exclude from Growth Chart --     Most recent vital signs: Vitals:   10/18/22 1647  BP: (!) 210/112  Pulse: (!) 113  Resp: 18  Temp: 97.7 F (36.5 C)  SpO2: 99%    General: Awake, no distress.  CV:  Good peripheral perfusion.  Regular rate and rhythm  Resp:  Normal effort.  Equal breath sounds bilaterally.  Abd:  No distention.  Soft, mild left lower quadrant tenderness.  No rebound or guarding.  ED Results / Procedures / Treatments   EKG  EKG viewed and interpreted by myself shows sinus tachycardia at 113 bpm with a narrow QRS, normal axis, slight QTc prolongation otherwise normal intervals.  Nonspecific findings.  RADIOLOGY  I have reviewed  and interpreted CT images.  Patient appears to have a very distended stomach.  Awaiting CT read for further evaluation. CT read as negative by radiology   MEDICATIONS ORDERED IN ED: Medications  morphine (PF) 4 MG/ML injection 4 mg (has no administration in time range)  ondansetron (ZOFRAN) injection 4 mg (has no administration in time range)  sodium chloride 0.9 % bolus 1,000 mL (has no administration in time range)     IMPRESSION / MDM / ASSESSMENT AND PLAN / ED COURSE  I reviewed the triage vital signs and the nursing notes.  Patient's presentation is most consistent with acute presentation with potential threat to life or bodily function.  Patient presents emergency department for nausea vomiting left lower quadrant abdominal pain.  Mild tenderness to palpation left lower quadrant my examination.  Does have dry appearing mucous membranes.  Will get IV hydration.  We will check labs and obtain CT imaging of the abdomen and pelvis.  Differential would include colitis, diverticulitis, gastritis, gastroenteritis, infectious etiology.  Patient's lab work has resulted showing an overall reassuring chemistry besides hyperglycemia and elevated anion gap  of 25.  Patient receiving IV fluids.  Suspect significant dehydration.  Given her anion gap is elevated and she is hyperglycemic as well.  Leukocytosis.  Patient has received IV insulin she is on her second liter of IV fluids.  Urinalysis does show greater than 500 glucose as well as ketones.  I have ordered a beta hydroxybutyric acid as well as a lactate to help decipher from dehydration induced anion gap versus true DKA.  Regardless patient will be admitted to the hospital service for further workup and treatment.  CT scan read as negative although does appear to have abdominal distention I ordered Reglan for the patient.  Patient agreeable to admission.  FINAL CLINICAL IMPRESSION(S) / ED DIAGNOSES   Nausea vomiting Abdominal  pain Hydration   Note:  This document was prepared using Dragon voice recognition software and may include unintentional dictation errors.   Minna Antis, MD 10/18/22 2035

## 2022-10-18 NOTE — ED Notes (Signed)
CBG improved to 221, BMP drawn and sent.  Await results from BMP and orders from Dr. Allena Katz based on this for K replacement before starting Insulin

## 2022-10-19 ENCOUNTER — Encounter: Payer: Self-pay | Admitting: Internal Medicine

## 2022-10-19 DIAGNOSIS — R739 Hyperglycemia, unspecified: Secondary | ICD-10-CM

## 2022-10-19 DIAGNOSIS — I10 Essential (primary) hypertension: Secondary | ICD-10-CM

## 2022-10-19 DIAGNOSIS — R1114 Bilious vomiting: Secondary | ICD-10-CM | POA: Diagnosis not present

## 2022-10-19 DIAGNOSIS — E860 Dehydration: Principal | ICD-10-CM

## 2022-10-19 LAB — BASIC METABOLIC PANEL
Anion gap: 13 (ref 5–15)
Anion gap: 10 (ref 5–15)
BUN: 7 mg/dL (ref 6–20)
BUN: 6 mg/dL (ref 6–20)
CO2: 23 mmol/L (ref 22–32)
CO2: 23 mmol/L (ref 22–32)
Calcium: 8.7 mg/dL — ABNORMAL LOW (ref 8.9–10.3)
Calcium: 8.6 mg/dL — ABNORMAL LOW (ref 8.9–10.3)
Chloride: 99 mmol/L (ref 98–111)
Chloride: 99 mmol/L (ref 98–111)
Creatinine, Ser: 0.47 mg/dL (ref 0.44–1.00)
Creatinine, Ser: 0.4 mg/dL — ABNORMAL LOW (ref 0.44–1.00)
GFR, Estimated: >60 mL/min (ref >60)
GFR, Estimated: >60 mL/min (ref >60)
Glucose, Bld: 222 mg/dL — ABNORMAL HIGH (ref 70–99)
Glucose, Bld: 212 mg/dL — ABNORMAL HIGH (ref 70–99)
Potassium: 3.3 mmol/L — ABNORMAL LOW (ref 3.5–5.1)
Potassium: 3.3 mmol/L — ABNORMAL LOW (ref 3.5–5.1)
Sodium: 135 mmol/L (ref 135–145)
Sodium: 132 mmol/L — ABNORMAL LOW (ref 135–145)

## 2022-10-19 LAB — MAGNESIUM: Magnesium: 1.6 mg/dL — ABNORMAL LOW (ref 1.7–2.4)

## 2022-10-19 LAB — BETA-HYDROXYBUTYRIC ACID
Beta-Hydroxybutyric Acid: 0.81 mmol/L — ABNORMAL HIGH (ref 0.05–0.27)
Beta-Hydroxybutyric Acid: 1.65 mmol/L — ABNORMAL HIGH (ref 0.05–0.27)

## 2022-10-19 LAB — PHOSPHORUS: Phosphorus: 2.8 mg/dL (ref 2.5–4.6)

## 2022-10-19 LAB — CBG MONITORING, ED: Glucose-Capillary: 208 mg/dL — ABNORMAL HIGH (ref 70–99)

## 2022-10-19 LAB — HIV ANTIBODY (ROUTINE TESTING W REFLEX): HIV Screen 4th Generation wRfx: NONREACTIVE

## 2022-10-19 LAB — HEMOGLOBIN A1C
Hgb A1c MFr Bld: 8.7 % — ABNORMAL HIGH (ref 4.8–5.6)
Mean Plasma Glucose: 202.99 mg/dL

## 2022-10-19 MED ORDER — POTASSIUM CHLORIDE 10 MEQ/100ML IV SOLN
10.0000 meq | INTRAVENOUS | Status: AC
  Administered 2022-10-19 (×4): 10 meq via INTRAVENOUS
  Filled 2022-10-19 (×4): qty 100

## 2022-10-19 MED ORDER — INSULIN GLARGINE-YFGN 100 UNIT/ML ~~LOC~~ SOLN
10.0000 [IU] | Freq: Every day | SUBCUTANEOUS | Status: DC
  Administered 2022-10-19: 10 [IU] via SUBCUTANEOUS
  Filled 2022-10-19: qty 0.1

## 2022-10-19 MED ORDER — DULOXETINE HCL 30 MG PO CPEP
30.0000 mg | ORAL_CAPSULE | Freq: Every day | ORAL | Status: DC
  Administered 2022-10-19: 30 mg via ORAL
  Filled 2022-10-19: qty 1

## 2022-10-19 MED ORDER — INSULIN ASPART 100 UNIT/ML IJ SOLN: 4.0000 [IU] | Freq: Three times a day (TID) | INTRAMUSCULAR | Status: DC

## 2022-10-19 MED ORDER — POTASSIUM CHLORIDE CRYS ER 20 MEQ PO TBCR
40.0000 meq | EXTENDED_RELEASE_TABLET | Freq: Once | ORAL | Status: AC
  Administered 2022-10-19: 40 meq via ORAL
  Filled 2022-10-19: qty 2

## 2022-10-19 MED ORDER — LISINOPRIL 10 MG PO TABS
20.0000 mg | ORAL_TABLET | Freq: Every day | ORAL | Status: DC
  Administered 2022-10-19: 20 mg via ORAL
  Filled 2022-10-19: qty 2

## 2022-10-19 MED ORDER — METOPROLOL SUCCINATE ER 50 MG PO TB24
100.0000 mg | ORAL_TABLET | Freq: Every day | ORAL | Status: DC
  Administered 2022-10-19: 100 mg via ORAL
  Filled 2022-10-19: qty 2

## 2022-10-19 MED ORDER — INFLUENZA VIRUS VACC SPLIT PF (FLUZONE) 0.5 ML IM SUSY: 0.5000 mL | PREFILLED_SYRINGE | INTRAMUSCULAR | Status: DC

## 2022-10-19 MED ORDER — INSULIN ASPART 100 UNIT/ML IJ SOLN: 0.0000 [IU] | Freq: Every day | INTRAMUSCULAR | Status: DC

## 2022-10-19 MED ORDER — PANTOPRAZOLE SODIUM 40 MG PO TBEC: 40.0000 mg | DELAYED_RELEASE_TABLET | Freq: Two times a day (BID) | ORAL | Status: DC

## 2022-10-19 MED ORDER — POTASSIUM CHLORIDE CRYS ER 20 MEQ PO TBCR: 40.0000 meq | EXTENDED_RELEASE_TABLET | Freq: Once | ORAL | Status: DC

## 2022-10-19 MED ORDER — TICAGRELOR 90 MG PO TABS: 90.0000 mg | ORAL_TABLET | Freq: Two times a day (BID) | ORAL | Status: DC

## 2022-10-19 MED ORDER — HYDRALAZINE HCL 20 MG/ML IJ SOLN
5.0000 mg | INTRAMUSCULAR | Status: DC | PRN
  Administered 2022-10-19: 5 mg via INTRAVENOUS
  Filled 2022-10-19: qty 1

## 2022-10-19 MED ORDER — INSULIN ASPART 100 UNIT/ML IJ SOLN: 0.0000 [IU] | Freq: Three times a day (TID) | INTRAMUSCULAR | Status: DC

## 2022-10-19 MED ORDER — GABAPENTIN 300 MG PO CAPS
300.0000 mg | ORAL_CAPSULE | Freq: Three times a day (TID) | ORAL | Status: DC
  Administered 2022-10-19: 300 mg via ORAL
  Filled 2022-10-19: qty 1

## 2022-10-19 MED ORDER — PANTOPRAZOLE SODIUM 40 MG PO TBEC: 40.0000 mg | DELAYED_RELEASE_TABLET | Freq: Every day | ORAL | 0 refills | Status: AC

## 2022-10-19 MED ORDER — MAGNESIUM SULFATE 2 GM/50ML IV SOLN
2.0000 g | Freq: Once | INTRAVENOUS | Status: AC
  Administered 2022-10-19: 2 g via INTRAVENOUS
  Filled 2022-10-19: qty 50

## 2022-10-19 MED ORDER — ASPIRIN 81 MG PO TBEC
81.0000 mg | DELAYED_RELEASE_TABLET | Freq: Every day | ORAL | Status: DC
  Administered 2022-10-19: 81 mg via ORAL
  Filled 2022-10-19: qty 1

## 2022-10-19 MED ORDER — GLIPIZIDE 5 MG PO TABS: 5.0000 mg | ORAL_TABLET | Freq: Every day | ORAL | 0 refills | Status: AC

## 2022-10-19 MED ORDER — LISINOPRIL 20 MG PO TABS: 20.0000 mg | ORAL_TABLET | Freq: Every day | ORAL | 1 refills | Status: AC

## 2022-10-19 MED ORDER — MORPHINE SULFATE (PF) 2 MG/ML IV SOLN
2.0000 mg | INTRAVENOUS | Status: DC | PRN
  Administered 2022-10-19 (×2): 2 mg via INTRAVENOUS
  Filled 2022-10-19 (×2): qty 1

## 2022-10-19 NOTE — Inpatient Diabetes Management (Addendum)
Inpatient Diabetes Program Recommendations  AACE/ADA: New Consensus Statement on Inpatient Glycemic Control (2015)  Target Ranges:  Prepandial:   less than 140 mg/dL      Peak postprandial:   less than 180 mg/dL (1-2 hours)      Critically ill patients:  140 - 180 mg/dL    Latest Reference Range & Units 10/18/22 22:32  Hemoglobin A1C 4.8 - 5.6 % 8.7 (H)  (H): Data is abnormally high  Latest Reference Range & Units 10/18/22 20:54 10/18/22 22:32  Beta-Hydroxybutyric Acid 0.05 - 0.27 mmol/L 2.12 (H) 0.81 (H)  (H): Data is abnormally high  Latest Reference Range & Units 10/18/22 16:48  Sodium 135 - 145 mmol/L 137  Potassium 3.5 - 5.1 mmol/L 3.3 (L)  Chloride 98 - 111 mmol/L 95 (L)  CO2 22 - 32 mmol/L 17 (L)  Glucose 70 - 99 mg/dL 960 (H)  BUN 6 - 20 mg/dL 12  Creatinine 4.54 - 0.98 mg/dL 1.19  Calcium 8.9 - 14.7 mg/dL 82.9  Anion gap 5 - 15  25 (H)  (L): Data is abnormally low (H): Data is abnormally high  Latest Reference Range & Units 10/18/22 20:59 10/18/22 22:27  Glucose-Capillary 70 - 99 mg/dL 562 (H)  10 units Novolog +  IVF boluses  221 (H)  (H): Data is abnormally high     Admit with: N&V/ DKA  History: DM2  Home DM Meds: Jardiance 25 mg daily       Glipizide 5 mg daily (NOT taking)         Current Orders: Semglee 10 units Daily     Novolog Moderate Correction Scale/ SSI (0-15 units) TID AC + HS     Novolog 4 units TID with meals    MD- Note IV Insulin Drip never started as Anion Gap and CO2 levels improved with IVF and one dose Novolog   Given pt admitted with DKA, may want to have pt hold home Jardiance at time of d/c home until she can follow up with her PCP.  May need different oral DM med for home.   Addendum: Met w/ pt at bedside.  Pt told me she has CBG meter at home but does not check often.  Goes to [facility] for primary care.  Has follow up appt on 10/25/2022.  Pt stated she is unsure when and why she stopped the Glipizide that was  listed in home med rec.  Spoke with patient about her current A1c of 8.7% (pt thinks her last A1c was higher than 9%).  Explained what an A1c is and what it measures.  Reminded patient that her goal A1c is 7% or less per ADA standards to prevent both acute and long-term complications.  Explained to patient the extreme importance of good glucose control at home.  Encouraged patient to check her CBGs at least TID AC at home and occasionally 2 hrs after meals and to to record all CBGs in a logbook for her PCP to review.  Reviewed ED labs with pt and explained that she was in early DKA--Explained what DKA is and treatment.  Reviewed with pt the importance of staying hydrated when taking Jardiance at home.  Also reviewed with pt that the discharging MD has given her a Rx for restarting Glipizide at home.  Pt very appreciative of visit and I am hopeful she will have better CBG control at home with the addition of the Glipizide.     --Will follow patient during hospitalization--  Ambrose Finland RN, MSN, CDCES Diabetes Coordinator Inpatient Glycemic Control Team Team Pager: [phone] (8a-5p)

## 2022-10-19 NOTE — ED Notes (Signed)
Md advised not to start the insulin drip. Will start on s/s insulin.

## 2022-10-19 NOTE — ED Notes (Signed)
MD notified pt requesting pain medication for back pain

## 2022-10-19 NOTE — Hospital Course (Addendum)
Taken from H&P.  Roselle Cucinella is a 52 y.o. female with medical history significant for Dm II, Htn, MI coming for N/V and abdominal pain since past few days.   On presentation she was hypertensive at 210/112.  Labs pertinent for potassium of 3.3, bicarb 17, blood glucose 479 and anion gap of 25.  Lipase mildly elevated at 55 and T. bili 1.4, leukocytosis at 17.1.  Beta-hydroxybutyrate at 2.12 and normal lactic acid.  Patient was given 10 units of IV regular insulin with IV fluid in ED and admission was requested for suspected DKA.  Later gap closed pretty quickly with insulin and fluids so she was not started on Endo tool.  9/4: Blood pressure remained elevated at 179/80.  Restarting home lisinopril at a higher dose of 20 mg daily.  Replacing magnesium and potassium.  Started on basal and short-acting.  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  We increased her home dose of lisinopril to 20 mg daily.  Patient remained stable and able to tolerate diet without any difficulty.  She was started on glipizide by PCP but was not taking it.  She was given a new prescription and advised to have a close follow-up with primary care provider for better control of diabetes and hypertension.  He was also counseled to stay compliant with medications which she was not.  Patient will continue on current medications and need to have a close follow-up with her providers for further management of her chronic conditions.

## 2022-10-19 NOTE — Assessment & Plan Note (Signed)
Patient given 10 units of insulin in the emergency room.  Beta hydroxy elevated and was admitted stat to progressive unit and started on DKA protocol. Later patient's gap was closed and was started on high-dose sliding scale insulin regimen.  A1c is pending.

## 2022-10-19 NOTE — Progress Notes (Signed)
PHARMACIST - PHYSICIAN COMMUNICATION  CONCERNING: IV to Oral Route Change Policy  RECOMMENDATION: This patient is receiving pantoprazole by the intravenous route.  Based on criteria approved by the Pharmacy and Therapeutics Committee, the intravenous medication(s) is/are being converted to the equivalent oral dose form(s).  DESCRIPTION: These criteria include: The patient is eating (either orally or via tube) and/or has been taking other orally administered medications for a least 24 hours The patient has no evidence of active gastrointestinal bleeding or impaired GI absorption (gastrectomy, short bowel, patient on TNA or NPO).  If you have questions about this conversion, please contact the Pharmacy Department    Tressie Ellis, [facility] 10/19/2022 10:47 AM

## 2022-10-19 NOTE — Discharge Summary (Signed)
Physician Discharge Summary   Patient: Claudia Mills MRN: [id number] DOB: 1971/01/23  Admit date:     10/18/2022  Discharge date: 10/19/22  Discharge Physician: Arnetha Courser   PCP: Emogene Morgan, MD   Recommendations at discharge:  Please obtain CBC and BMP in 1 week Follow-up with primary care provider within a week Follow-up with cardiology to discuss the need of continuation of DAPT at this time.  Discharge Diagnoses: Principal Problem:   Nausea and vomiting Active Problems:   DKA, type 2, not at goal [facility])   Essential hypertension   Hyperglycemia   Dehydration   Hospital Course: Taken from H&P.  Claudia Mills is a 52 y.o. female with medical history significant for Dm II, Htn, MI coming for N/V and abdominal pain since past few days.   On presentation she was hypertensive at 210/112.  Labs pertinent for potassium of 3.3, bicarb 17, blood glucose 479 and anion gap of 25.  Lipase mildly elevated at 55 and T. bili 1.4, leukocytosis at 17.1.  Beta-hydroxybutyrate at 2.12 and normal lactic acid.  Patient was given 10 units of IV regular insulin with IV fluid in ED and admission was requested for suspected DKA.  Later gap closed pretty quickly with insulin and fluids so she was not started on Endo tool.  9/4: Blood pressure remained elevated at 179/80.  Restarting home lisinopril at a higher dose of 20 mg daily.  Replacing magnesium and potassium.  Started on basal and short-acting.  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  We increased her home dose of lisinopril to 20 mg daily.  Patient remained stable and able to tolerate diet without any difficulty.  She was started on glipizide by PCP but was not taking it.  She was given a new prescription and advised to have a close follow-up with primary care provider for better control of diabetes and hypertension.  He was also  counseled to stay compliant with medications which she was not.  Patient will continue on current medications and need to have a close follow-up with her providers for further management of her chronic conditions.   Consultants: None Procedures performed: None Disposition: Home Diet recommendation:  Discharge Diet Orders (From admission, onward)     Start     Ordered   10/19/22 0000  Diet - low sodium heart healthy        10/19/22 1407           Cardiac and Carb modified diet DISCHARGE MEDICATION: Allergies as of 10/19/2022       Reactions   Bee Venom Anaphylaxis        Medication List     TAKE these medications    aspirin EC 81 MG tablet Take 81 mg by mouth daily. Swallow whole.   atorvastatin 80 MG tablet Commonly known as: Lipitor Take 1 tablet (80 mg total) by mouth daily.   Brilinta 90 MG Tabs tablet Generic drug: ticagrelor Take 90 mg by mouth 2 (two) times daily.   DULoxetine 30 MG capsule Commonly known as: CYMBALTA Take 30 mg by mouth daily.   gabapentin 300 MG capsule Commonly known as: NEURONTIN Take 300 mg by mouth 3 (three) times daily.   glipiZIDE 5 MG tablet Commonly known as: GLUCOTROL Take 1 tablet (5 mg total) by mouth daily before breakfast.   JARDIANCE PO Take 25 mg by  mouth daily.   lisinopril 20 MG tablet Commonly known as: ZESTRIL Take 1 tablet (20 mg total) by mouth daily. Start taking on: October 20, 2022 What changed:  medication strength how much to take   methocarbamol 750 MG tablet Commonly known as: Robaxin-750 Take 1 tablet (750 mg total) by mouth 3 (three) times daily.   metoprolol succinate 100 MG 24 hr tablet Commonly known as: Toprol XL Take 1 tablet (100 mg total) by mouth daily. Take with or immediately following a meal.   nitroGLYCERIN 0.3 MG SL tablet Commonly known as: NITROSTAT Place 0.3 mg under the tongue every 5 (five) minutes as needed for chest pain.   ondansetron 4 MG disintegrating  tablet Commonly known as: ZOFRAN-ODT Take 1 tablet (4 mg total) by mouth every 8 (eight) hours as needed for nausea or vomiting.   oxyCODONE-acetaminophen 5-325 MG tablet Commonly known as: Percocet Take 1 tablet by mouth every 4 (four) hours as needed for severe pain.   pantoprazole 40 MG tablet Commonly known as: PROTONIX Take 1 tablet (40 mg total) by mouth daily.        Follow-up Information     Aycock, Ngwe A, MD. Schedule an appointment as soon as possible for a visit in 1 week(s).   Specialty: Family Medicine Contact information: [address] [place] [address] [phone]                Discharge Exam: Ceasar Mons Weights   10/18/22 1648  Weight: 54.4 kg   General.  Thin built lady, in no acute distress. Pulmonary.  Lungs clear bilaterally, normal respiratory effort. CV.  Regular rate and rhythm, no JVD, rub or murmur. Abdomen.  Soft, nontender, nondistended, BS positive. CNS.  Alert and oriented .  No focal neurologic deficit. Extremities.  No edema, no cyanosis, pulses intact and symmetrical. Psychiatry.  Judgment and insight appears normal.   Condition at discharge: stable  The results of significant diagnostics from this hospitalization (including imaging, microbiology, ancillary and laboratory) are listed below for reference.   Imaging Studies: CT ABDOMEN PELVIS W CONTRAST  Result Date: 10/18/2022 CLINICAL DATA:  Abdominal pain, acute, nonlocalized EXAM: CT ABDOMEN AND PELVIS WITH CONTRAST TECHNIQUE: Multidetector CT imaging of the abdomen and pelvis was performed using the standard protocol following bolus administration of intravenous contrast. RADIATION DOSE REDUCTION: This exam was performed according to the departmental dose-optimization program which includes automated exposure control, adjustment of the mA and/or kV according to patient size and/or use of iterative reconstruction technique. CONTRAST:  OMNIPAQUE IOHEXOL 300 MG/ML   SOLN COMPARISON:  12/30/2015 FINDINGS: Lower chest: No acute abnormality Hepatobiliary: No focal hepatic abnormality. Gallbladder unremarkable. Pancreas: No focal abnormality or ductal dilatation. Spleen: Benign calcification in the spleen.  Normal size. Adrenals/Urinary Tract: No suspicious renal or adrenal lesion. Small nonobstructing stone in the lower pole of the right kidney. No ureteral stones or hydronephrosis. Urinary bladder unremarkable. Stomach/Bowel: Mild distention of the stomach. Large and small bowel decompressed, unremarkable. Normal appendix. Vascular/Lymphatic: Severe aortoiliac atherosclerosis, advanced for age. No aneurysm or adenopathy. Reproductive: Uterus and adnexa unremarkable. No mass. Bilateral tubal ligation clips. Other: No free fluid or free air. Musculoskeletal: No acute bony abnormality. Severe compression fracture at L1 with vertebroplasty changes. Mild compression fracture through the inferior endplate of L2, unchanged since prior MRI. IMPRESSION: No acute findings in the abdomen or pelvis. Right lower pole nephrolithiasis.  No hydronephrosis. Severe diffuse aortoiliac atherosclerosis, advanced for age. Moderately distended stomach with fluid. Electronically  Signed   By: Charlett Nose M.D.   On: 10/18/2022 19:58   DG Lumbar Spine 2-3 Views  Result Date: 10/13/2022 CLINICAL DATA:  52 year old female with acute recurrence of back pain after tripping on her cat EXAM: LUMBAR SPINE - 2-3 VIEW COMPARISON:  Recent prior kyphoplasty 10/06/2022; CT scan of the lumbar spine 09/02/2022; MRI of the lumbar spine 08/22/2022 FINDINGS: Stable postoperative changes of L1 cement augmentation. Chronic compression fracture with Schmorl's node of the L2 vertebral body appears similar compared to prior. No definite acute fracture or malalignment. Vertebral body heights are otherwise maintained. Degenerative changes are again noted at L4-L5 and L5-S1. Bilateral lower lumbar facet arthropathy.  Atherosclerotic calcifications present in the abdominal aorta. Tubal ligation clips noted on the frontal view. IMPRESSION: 1. No convincing evidence of acute fracture by conventional x-ray. 2. Stable postoperative changes of L1 cement augmentation. 3. Stable chronic compression fracture with Schmorl's node deformity at L2. Electronically Signed   By: Malachy Moan M.D.   On: 10/13/2022 14:30   IR Radiologist Eval & Mgmt  Result Date: 10/13/2022 EXAM: ESTABLISHED PATIENT OFFICE VISIT CHIEF COMPLAINT: SEE EPIC NOTE HISTORY OF PRESENT ILLNESS: SEE EPIC NOTE REVIEW OF SYSTEMS: SEE EPIC NOTE PHYSICAL EXAMINATION: SEE EPIC NOTE ASSESSMENT AND PLAN: SEE EPIC NOTE Electronically Signed   By: Malachy Moan M.D.   On: 10/13/2022 14:27   IR KYPHO LUMBAR INC FX REDUCE BONE BX UNI/BIL CANNULATION INC/IMAGING  Result Date: 10/06/2022 INDICATION: L1 compression fracture EXAM: L1 vertebral body augmentation using balloon kyphoplasty COMPARISON:  None Available. MEDICATIONS: Documented in the EMR ANESTHESIA/SEDATION: Moderate (conscious) sedation was employed during this procedure. A total of Versed 2 mg and Fentanyl 75 mcg was administered intravenously by the radiology nurse. Total intra-service moderate Sedation Time: 27 minutes. The patient's level of consciousness and vital signs were monitored continuously by radiology nursing throughout the procedure under my direct supervision. FLUOROSCOPY: Radiation Exposure Index (as provided by the fluoroscopic device): 6.0 minutes (55 mGy) COMPLICATIONS: None immediate. PROCEDURE: Informed written consent was obtained from the patient after a thorough discussion of the procedural risks, benefits and alternatives. All questions were addressed. Maximal Sterile Barrier Technique was utilized including caps, mask, sterile gowns, sterile gloves, sterile drape, hand hygiene and skin antiseptic. A timeout was performed prior to the initiation of the procedure. The patient was  positioned prone on the exam table. A unipedicular access was planned. Skin entry site was marked overlying the L1 vertebral body using fluoroscopy. The overlying skin was then prepped and draped in the standard sterile fashion. Local analgesia was obtained with 1% lidocaine. Attention was turned to the right side. Under fluoroscopic guidance, a 10 gauge introducer needle was advanced towards the lateral margin of the pedicle. Using multiple projections, the introducer needle was advanced towards the posterior margin of the vertebral body via a transpedicular approach. The inner needle was then removed, and the drill was advanced towards the anterior margin of the vertebral body. A Kyphon 15 mm inflatable bone tamp was then advanced through the transpedicular access needle and positioned within the mid vertebral body. Kyphoplasty was then performed, ensuring that the balloon contours stayed within the vertebral body margins. The balloon was then deflated and removed, followed by advancement of the bone filler device and the instillation of acrylic bone cement with excellent filling in the AP and lateral projections. No extravasation was noted in the disk spaces or posteriorly into the spinal canal. No epidural venous contamination was seen. At the end of  the procedure, the introducer cannula and bone filler device were removed without difficulty. A clean dressing was placed after hemostasis. The patient tolerated all aspects of the procedure well, and was transferred to recovery in stable condition. IMPRESSION: 1. Successful L1 vertebral body augmentation using balloon kyphoplasty. If the patient has known osteoporosis, recommend treatment as clinically indicated. If the patient's bone density status is unknown, DEXA scan is recommended. Electronically Signed   By: Olive Bass M.D.   On: 10/06/2022 10:52   IR Radiologist Eval & Mgmt  Result Date: 09/20/2022 EXAM: NEW PATIENT OFFICE VISIT CHIEF COMPLAINT: Refer  to EMR HISTORY OF PRESENT ILLNESS: The patient reports acute onset of lower back pain on Jun 28, 2022 following a low force mechanism injury while she was shifting out of her car seat. The pain was severe enough resulting in having to call an ambulance and be evaluated in the emergency room that day. She was prescribed a lumbar brace that day. She was then evaluated in the neurosurgery clinic on July 27, 2022, and most recently August 31, 2022. Given her persistent back pain, she was referred to the interventional radiology clinic for consideration of kyphoplasty candidacy. She reports constant lower back pain that she rates 8/10 most of the time. It was particularly worse when she is going up stairs or changing positions. She has been prescribed oxycodone/Percocet for pain relief. She has moderate relief with prescription pain medication to 4/10, and does not complain of any significant side effects related to the prescription pain medication. She has been given a lumbar brace, but has been unable to use it significantly due to reported weight loss and poor fit, resulting in the brace moving upwards and "choking her." She rates significant disability on the [employer] disability questionnaire with 19/24 positive. Her most recent imaging is an MRI lumbar spine performed on August 22, 2022. This demonstrates a subacute compression fracture of the L1 vertebral body with advanced greater than 50% height loss. There has been progression of height loss when compared to her CT of the lumbar spine performed on Jun 28, 2022. Medication history significant for daily aspirin. REVIEW OF SYSTEMS: Refer to EMR PHYSICAL EXAMINATION: Refer to EMR ASSESSMENT AND PLAN: Refer to EMR Electronically Signed   By: Olive Bass M.D.   On: 09/20/2022 09:32    Microbiology: Results for orders placed or performed in visit on 12/28/18  Novel Coronavirus, NAA ([facility])     Status: None   Collection Time: 12/28/18 12:00 AM   Specimen:  Nasopharyngeal(NP) swabs in vial transport medium   NASOPHARYNGE  TESTING  Result Value Ref Range Status   SARS-CoV-2, NAA Not Detected Not Detected Final    Comment: This nucleic acid amplification test was developed and its performance characteristics determined by [employer]. Nucleic acid amplification tests include PCR and TMA. This test has not been FDA cleared or approved. This test has been authorized by FDA under an Emergency Use Authorization (EUA). This test is only authorized for the duration of time the declaration that circumstances exist justifying the authorization of the emergency use of in vitro diagnostic tests for detection of SARS-CoV-2 virus and/or diagnosis of COVID-19 infection under section 564(b)(1) of the Act, 21 U.S.C. 045WUJ-8(J) (1), unless the authorization is terminated or revoked sooner. When diagnostic testing is negative, the possibility of a false negative result should be considered in the context of a patient's recent exposures and the presence of clinical signs and symptoms consistent with COVID-19. An individual  without symptoms of COVID-19 and who is not shedding SARS-CoV-2 virus would  expect to have a negative (not detected) result in this assay.     Labs: CBC: Recent Labs  Lab 10/18/22 1648  WBC 17.1*  HGB 15.7*  HCT 44.6  MCV 97.2  PLT 377   Basic Metabolic Panel: Recent Labs  Lab 10/18/22 1648 10/18/22 2232 10/19/22 0326 10/19/22 0641  NA 137 136 135 132*  K 3.3* 2.8* 3.3* 3.3*  CL 95* 98 99 99  CO2 17* 24 23 23   GLUCOSE 479* 232* 222* 212*  BUN 12 8 7 6   CREATININE 0.65 0.50 0.47 0.40*  CALCIUM 10.0 8.8* 8.7* 8.6*  MG  --   --  1.6*  --   PHOS  --   --  2.8  --    Liver Function Tests: Recent Labs  Lab 10/18/22 1648  AST 17  ALT 12  ALKPHOS 126  BILITOT 1.4*  PROT 8.5*  ALBUMIN 4.6   CBG: Recent Labs  Lab 10/18/22 2059 10/18/22 2227  GLUCAP 414* 221*    Discharge time spent: greater than 30  minutes.  This record has been created using Conservation officer, historic buildings. Errors have been sought and corrected,but may not always be located. Such creation errors do not reflect on the standard of care.   Signed: Arnetha Courser, MD [facility] 10/19/2022

## 2022-10-19 NOTE — Progress Notes (Signed)
PHARMACY CONSULT NOTE - ELECTROLYTES  Pharmacy Consult for Electrolyte Monitoring and Replacement   Recent Labs: Height: 5\' 3"  (160 cm) Weight: 54.4 kg (119 lb 14.9 oz) IBW/kg (Calculated) : 52.4 Estimated Creatinine Clearance: 68 mL/min (A) (by C-G formula based on SCr of 0.4 mg/dL (L)). Potassium (mmol/L)  Date Value  10/19/2022 3.3 (L)   Magnesium (mg/dL)  Date Value  13/09/6576 1.6 (L)   Calcium (mg/dL)  Date Value  46/96/2952 8.6 (L)   Albumin (g/dL)  Date Value  84/13/2440 4.6   Phosphorus (mg/dL)  Date Value  12/11/2534 2.8   Sodium (mmol/L)  Date Value  10/19/2022 132 (L)   Corrected Ca:   mg/dL  Assessment  Claudia Mills is a 52 y.o. female presenting with emesis, Abd pain. PMH significant for Diabetes, HTN, MI. Pharmacy has been consulted to monitor and replace electrolytes. Initally started on DKA protocol then gap closed and was started on moderate sliding scale insulin w/ meal coverage and semglee  Diet: carb modified MIVF: D5LR @ 125 mL/hr Pertinent medications: lisinopril  Goal of Therapy: Electrolytes WNL  Plan:  Mag 1.6  Will order Magnesium sulfate 2 gm IV x1 K 3.3  MD ordered KCL 40 meq PO x 1 Check BMP, Mg, Phos with AM labs  Thank you for allowing pharmacy to be a part of this patient's care.  Angelique Blonder, PharmD Clinical Pharmacist 10/19/2022 9:08 AM

## 2022-10-19 NOTE — Assessment & Plan Note (Signed)
Vitals:   10/18/22 1647 10/18/22 1747 10/18/22 1815 10/18/22 2102  BP: (!) 210/112 (!) 207/89 (!) 202/102 (!) 160/72   10/18/22 2200 10/18/22 2330 10/19/22 0000 10/19/22 0030  BP: (!) 154/72 (!) 162/78 (!) 162/68 (!) 149/76   10/19/22 0100 10/19/22 0130  BP: (!) 155/69 (!) 153/82  Will continue patient on metoprolol, as needed hydralazine.

## 2022-10-19 NOTE — Assessment & Plan Note (Signed)
Patient presenting with nausea and vomiting as attributed to acidosis and her underlying diabetic ketoacidosis with elevated anion gap.  Patient denies any alcohol abuse.   Supportive care with IV fluids, antiemetics as needed as patient has prolonged QTc and was given Protonix for her nausea.

## 2022-10-20 ENCOUNTER — Ambulatory Visit
Admission: RE | Admit: 2022-10-20 | Discharge: 2022-10-20 | Disposition: A | Discharge status: Home / Self Care | Payer: Medicaid Other | Source: Ambulatory Visit | Attending: Interventional Radiology | Admitting: Interventional Radiology

## 2022-10-20 DIAGNOSIS — M8008XA Age-related osteoporosis with current pathological fracture, vertebra(e), initial encounter for fracture: Secondary | ICD-10-CM

## 2022-10-20 DIAGNOSIS — S22080A Wedge compression fracture of T11-T12 vertebra, initial encounter for closed fracture: Secondary | ICD-10-CM

## 2022-10-20 HISTORY — PX: IR RADIOLOGIST EVAL & MGMT: IMG5224

## 2022-10-20 NOTE — Progress Notes (Signed)
IR brief note   The patient is status post L1 kyphoplasty on October 06, 2022.  The patient is now approximately 2 weeks status post kyphoplasty.  The patient initially reported immediate relief in her severe lower back pain after the procedure, which included a weekend camping trip.  She reported feeling "like a new person."  Unfortunately, approximately 1 week after the procedure, she reported tripping over her CT and had new onset of lower back pain.  She was re-evaluated in the IR clinic including a lumbar spine radiograph, which demonstrated no new acute fracture of the lumbar spine.  Of note, she was also admitted to the hospital on October 18, 2022 and was discharged the following day for nausea and vomiting.  She reports some ongoing abdominal pain today.  She has an appointment with her PCP next week.  Following her most recent fall, she does report ongoing 5/10 back pain, and is taking a muscle relaxer intermittent.    ASSESSMENT AND PLAN: Patient reported near immediate relief of her lower back pain following kyphoplasty.  However, she experienced another fall and now has some ongoing back pain/soreness.  Given her initial relief and lack of new fracture on repeat imaging, I expect her current pain to be due to muscle strain/injury.  This would be expected to improve over the next several weeks.  She may continue to follow-up in the interventional radiology clinic as needed.    Olive Bass, MD  Vascular and Interventional Radiology 10/20/2022 11:32 AM

## 2022-10-21 ENCOUNTER — Emergency Department
Admission: EM | Admit: 2022-10-21 | Discharge: 2022-10-21 | Disposition: A | Discharge status: Home / Self Care | Payer: Medicaid Other | Attending: Emergency Medicine | Admitting: Emergency Medicine

## 2022-10-21 ENCOUNTER — Other Ambulatory Visit: Payer: Self-pay

## 2022-10-21 ENCOUNTER — Encounter: Payer: Self-pay | Admitting: Intensive Care

## 2022-10-21 DIAGNOSIS — E860 Dehydration: Secondary | ICD-10-CM | POA: Diagnosis not present

## 2022-10-21 DIAGNOSIS — E1165 Type 2 diabetes mellitus with hyperglycemia: Secondary | ICD-10-CM | POA: Diagnosis not present

## 2022-10-21 DIAGNOSIS — R739 Hyperglycemia, unspecified: Secondary | ICD-10-CM

## 2022-10-21 DIAGNOSIS — R55 Syncope and collapse: Secondary | ICD-10-CM | POA: Insufficient documentation

## 2022-10-21 LAB — CBC
HCT: 42 % (ref 36.0–46.0)
Hemoglobin: 15.1 g/dL — ABNORMAL HIGH (ref 12.0–15.0)
MCH: 34 pg (ref 26.0–34.0)
MCHC: 36 g/dL (ref 30.0–36.0)
MCV: 94.6 fL (ref 80.0–100.0)
Platelets: 397 10*3/uL (ref 150–400)
RBC: 4.44 MIL/uL (ref 3.87–5.11)
RDW: 11.3 % — ABNORMAL LOW (ref 11.5–15.5)
WBC: 11.9 10*3/uL — ABNORMAL HIGH (ref 4.0–10.5)
nRBC: 0 % (ref 0.0–0.2)

## 2022-10-21 LAB — BASIC METABOLIC PANEL
Anion gap: 15 (ref 5–15)
BUN: 15 mg/dL (ref 6–20)
CO2: 21 mmol/L — ABNORMAL LOW (ref 22–32)
Calcium: 9.1 mg/dL (ref 8.9–10.3)
Chloride: 88 mmol/L — ABNORMAL LOW (ref 98–111)
Creatinine, Ser: 0.95 mg/dL (ref 0.44–1.00)
GFR, Estimated: >60 mL/min (ref >60)
Glucose, Bld: 461 mg/dL — ABNORMAL HIGH (ref 70–99)
Potassium: 3.1 mmol/L — ABNORMAL LOW (ref 3.5–5.1)
Sodium: 124 mmol/L — ABNORMAL LOW (ref 135–145)

## 2022-10-21 LAB — CBG MONITORING, ED: Glucose-Capillary: 330 mg/dL — ABNORMAL HIGH (ref 70–99)

## 2022-10-21 MED ORDER — GLIPIZIDE 5 MG PO TABS
5.0000 mg | ORAL_TABLET | Freq: Every day | ORAL | Status: DC
  Administered 2022-10-21: 5 mg via ORAL
  Filled 2022-10-21 (×2): qty 1

## 2022-10-21 MED ORDER — SODIUM CHLORIDE 0.9 % IV BOLUS
1000.0000 mL | Freq: Once | INTRAVENOUS | Status: AC
  Administered 2022-10-21: 1000 mL via INTRAVENOUS

## 2022-10-21 NOTE — Discharge Instructions (Addendum)
Please remember to take your new diabetes medication (glipizide) with food

## 2022-10-21 NOTE — ED Triage Notes (Signed)
Friend with patient reports the patient had an episode of LOC around 2:30pm today. Admitted to [facility] on 10/18/22 due to hyperglycemia.   History of diabetes.   A&O x4 in triage

## 2022-10-21 NOTE — ED Notes (Signed)
Pt verbalizes understanding of discharge instructions. Opportunity for questioning and answers were provided. Pt discharged from ED to home with friend.    

## 2022-10-21 NOTE — ED Provider Notes (Signed)
[facility] Provider Note   Event Date/Time   First MD Initiated Contact with Patient 10/21/22 1600     (approximate) History  Loss of Consciousness  HPI Claudia Mills is a 52 y.o. female with a stated past medical history of type 2 diabetes who presents complaining of an episode of loss of consciousness at around 1430 today.  Patient states that she became extremely lightheaded and lost consciousness for an unknown period of time.  Patient states that she has had polyuria, polydipsia, and feels that her blood sugar is elevated as she has not been able to get her glipizide due to financial constraints.  Patient states that she will she has taken his Jardiance on time and as prescribed.  Patient denies any subsequent losses of consciousness ROS: Patient currently denies any vision changes, tinnitus, difficulty speaking, facial droop, sore throat, chest pain, shortness of breath, abdominal pain, nausea/vomiting/diarrhea, dysuria, or weakness/numbness/paresthesias in any extremity   Physical Exam  Triage Vital Signs: ED Triage Vitals  Encounter Vitals Group     BP 10/21/22 1500 114/67     Systolic BP Percentile --      Diastolic BP Percentile --      Pulse Rate 10/21/22 1500 (!) 123     Resp 10/21/22 1500 18     Temp 10/21/22 1500 99.8 F (37.7 C)     Temp Source 10/21/22 1500 Oral     SpO2 10/21/22 1500 100 %     Weight 10/21/22 1502 120 lb (54.4 kg)     Height 10/21/22 1502 5\' 3"  (1.6 m)     Head Circumference --      Peak Flow --      Pain Score 10/21/22 1501 3     Pain Loc --      Pain Education --      Exclude from Growth Chart --    Most recent vital signs: Vitals:   10/21/22 1500  BP: 114/67  Pulse: (!) 123  Resp: 18  Temp: 99.8 F (37.7 C)  SpO2: 100%   General: Awake, oriented x4. CV:  Good peripheral perfusion.  Resp:  Normal effort.  Abd:  No distention.  Other:  Middle-aged well-developed, well-nourished Caucasian female resting  comfortably in no acute distress ED Results / Procedures / Treatments  Labs (all labs ordered are listed, but only abnormal results are displayed) Labs Reviewed  BASIC METABOLIC PANEL - Abnormal; Notable for the following components:      Result Value   Sodium 124 (*)    Potassium 3.1 (*)    Chloride 88 (*)    CO2 21 (*)    Glucose, Bld 461 (*)    All other components within normal limits  CBC - Abnormal; Notable for the following components:   WBC 11.9 (*)    Hemoglobin 15.1 (*)    RDW 11.3 (*)    All other components within normal limits  URINALYSIS, ROUTINE W REFLEX MICROSCOPIC  CBG MONITORING, ED   EKG ED ECG REPORT I, Merwyn Katos, the attending physician, personally viewed and interpreted this ECG. Date: 10/21/2022 EKG Time: 1504 Rate: 125 Rhythm: Tachycardic sinus rhythm QRS Axis: normal Intervals: normal ST/T Wave abnormalities: normal Narrative Interpretation: Tachycardic sinus rhythm.  No evidence of acute ischemia PROCEDURES: Critical Care performed: No .1-3 Lead EKG Interpretation  Performed by: Merwyn Katos, MD Authorized by: Merwyn Katos, MD     Interpretation: normal     ECG rate:  92  ECG rate assessment: normal     Rhythm: sinus rhythm     Ectopy: none     Conduction: normal    MEDICATIONS ORDERED IN ED: Medications  glipiZIDE (GLUCOTROL) tablet 5 mg (has no administration in time range)  sodium chloride 0.9 % bolus 1,000 mL (1,000 mLs Intravenous New Bag/Given 10/21/22 1749)   IMPRESSION / MDM / ASSESSMENT AND PLAN / ED COURSE  I reviewed the triage vital signs and the nursing notes.                             The patient is on the cardiac monitor to evaluate for evidence of arrhythmia and/or significant heart rate changes. Patient's presentation is most consistent with acute presentation with potential threat to life or bodily function. Patient's presentation most consistent with hyperglycemic state WITHOUT evidence of DKA. Given  Exam, History, and Workup I have low suspicion for an emergent precipitating factor of this hyperglycemic state such as atypical MI, acute abdomen, or other serious bacterial illness. Patient is Type 2 Diabetic with changes in medication regimen/adherence.  Findings: Patient without AGAP or significant ketones in urine to suggest DKA. Findings compatible with pseudohyponatremia Interventions: IVF bolus  Re-evaluation: Patient's serum glucose downtrended significantly with stable electrolytes and no anion gap at this time.  Disposition: Discharge home with appropriate insulin regimen and prompt PCP follow up instructions.   FINAL CLINICAL IMPRESSION(S) / ED DIAGNOSES   Final diagnoses:  Hyperglycemia  Syncope and collapse  Dehydration   Rx / DC Orders   ED Discharge Orders     None      Note:  This document was prepared using Dragon voice recognition software and may include unintentional dictation errors.   Merwyn Katos, MD 10/21/22 [phone]

## 2022-10-24 DIAGNOSIS — R1012 Left upper quadrant pain: Secondary | ICD-10-CM | POA: Diagnosis not present

## 2022-10-24 DIAGNOSIS — R112 Nausea with vomiting, unspecified: Secondary | ICD-10-CM | POA: Diagnosis not present

## 2022-10-24 DIAGNOSIS — Z79899 Other long term (current) drug therapy: Secondary | ICD-10-CM | POA: Diagnosis not present

## 2022-10-24 DIAGNOSIS — I252 Old myocardial infarction: Secondary | ICD-10-CM | POA: Diagnosis not present

## 2022-10-24 DIAGNOSIS — E119 Type 2 diabetes mellitus without complications: Secondary | ICD-10-CM | POA: Diagnosis not present

## 2022-10-24 DIAGNOSIS — I10 Essential (primary) hypertension: Secondary | ICD-10-CM | POA: Diagnosis not present

## 2022-10-24 DIAGNOSIS — R1032 Left lower quadrant pain: Secondary | ICD-10-CM | POA: Diagnosis not present

## 2022-10-24 DIAGNOSIS — Z7984 Long term (current) use of oral hypoglycemic drugs: Secondary | ICD-10-CM | POA: Diagnosis not present

## 2022-10-24 DIAGNOSIS — R109 Unspecified abdominal pain: Secondary | ICD-10-CM | POA: Diagnosis not present

## 2022-10-24 NOTE — Group Note (Deleted)

## 2022-10-25 DIAGNOSIS — I10 Essential (primary) hypertension: Secondary | ICD-10-CM | POA: Diagnosis not present

## 2022-10-25 DIAGNOSIS — Z0130 Encounter for examination of blood pressure without abnormal findings: Secondary | ICD-10-CM | POA: Diagnosis not present

## 2022-10-25 DIAGNOSIS — Z712 Person consulting for explanation of examination or test findings: Secondary | ICD-10-CM | POA: Diagnosis not present

## 2022-10-25 DIAGNOSIS — E1140 Type 2 diabetes mellitus with diabetic neuropathy, unspecified: Secondary | ICD-10-CM | POA: Diagnosis not present

## 2022-10-25 DIAGNOSIS — M810 Age-related osteoporosis without current pathological fracture: Secondary | ICD-10-CM | POA: Diagnosis not present

## 2022-10-25 DIAGNOSIS — Z1389 Encounter for screening for other disorder: Secondary | ICD-10-CM | POA: Diagnosis not present

## 2022-10-25 DIAGNOSIS — E1165 Type 2 diabetes mellitus with hyperglycemia: Secondary | ICD-10-CM | POA: Diagnosis not present

## 2022-10-27 ENCOUNTER — Telehealth: Payer: Self-pay | Admitting: Orthopedic Surgery

## 2022-10-27 NOTE — Telephone Encounter (Signed)
Patient is asking for an extension on her work note. She is still not able to bend, twist, or lift. Her last work note had her out through 10/19/2022. She just had an injection on 10/20/2022 and she is still hurting. I asked her how much longer was she requesting she said she did not know because she is still in alot of pain.

## 2022-10-27 NOTE — Telephone Encounter (Signed)
Please make her a follow up with me in about 4 weeks and you can give her a work note until that appointment.

## 2022-10-27 NOTE — Telephone Encounter (Signed)
She scheduled appt for 11/24/2022. She will pick up her work note.

## 2022-11-20 NOTE — Progress Notes (Deleted)
Referring Physician:  Emogene Morgan, MD [address] [place],  [place] [address]  Primary Physician:  Emogene Morgan, MD  History of Present Illness: 11/20/2022 Claudia Mills has a history of HTN, CAD, and DM. Recent diagnosis of vertigo.   She had L1 compression fracture back on 06/28/22. DEXA showed osteoporosis.   She was referred to IR and had L1 kyphoplasty on 10/06/22. She was doing well after her procedure with no pain until a week later when she tripped over her cat on 10/11/22.   She was seen in ED on 10/12/22. She saw IR on 10/13/22 and repeat xrays showed no acute compression fractures.   She was given a note keeping her out of work. She is here for follow up.      She is taking prn robaxin.     Seen in ED on 06/28/22 after injury to back and feeling a pop. History of compression fractures in the past. She was found to have an acute L1 compression fracture  and likely chronic L2 compression fracture. She was placed in TLSO brace.   She is here for follow up.   She has constant LBP with no leg pain. Pain is worse with prolonged standing, bending, and lifting. Some improvement in pain with laying flat. Had an episode on Friday where she felt her legs getting weak, she fell down on left knee. No increase in back pain since that episode. No other numbness, tingling, or weakness.   She is not wearing her brace- feels like it is "strangling her."  Given percocet 5/325 from the ED.   Bowel/Bladder Dysfunction: none  Conservative measures:  Physical therapy: has not participated in Multimodal medical therapy including regular antiinflammatories: percocet   Injections: has not received epidural steroid injections  Past Surgery: denies  Claudia Mills has no symptoms of cervical myelopathy.  The symptoms are causing a significant impact on the patient's life.   Review of Systems:  A 10 point review of systems is negative, except for the pertinent positives and  negatives detailed in the HPI.  Past Medical History: Past Medical History:  Diagnosis Date   Diabetes mellitus without complication (HCC)    Hypertension    MI (myocardial infarction) (HCC)     Past Surgical History: Past Surgical History:  Procedure Laterality Date   CORONARY ANGIOPLASTY WITH STENT PLACEMENT     IR KYPHO LUMBAR INC FX REDUCE BONE BX UNI/BIL CANNULATION INC/IMAGING  10/06/2022   IR RADIOLOGIST EVAL & MGMT  09/20/2022   IR RADIOLOGIST EVAL & MGMT  10/13/2022   IR RADIOLOGIST EVAL & MGMT  10/20/2022    Allergies: Allergies as of 11/24/2022 - Review Complete 10/21/2022  Allergen Reaction Noted   Bee venom Anaphylaxis 04/07/2021    Medications: Outpatient Encounter Medications as of 11/24/2022  Medication Sig   aspirin EC 81 MG tablet Take 81 mg by mouth daily. Swallow whole.   atorvastatin (LIPITOR) 80 MG tablet Take 1 tablet (80 mg total) by mouth daily. (Patient not taking: Reported on 09/20/2022)   BRILINTA 90 MG TABS tablet Take 90 mg by mouth 2 (two) times daily.   DULoxetine (CYMBALTA) 30 MG capsule Take 30 mg by mouth daily.   Empagliflozin (JARDIANCE PO) Take 25 mg by mouth daily.   gabapentin (NEURONTIN) 300 MG capsule Take 300 mg by mouth 3 (three) times daily.   glipiZIDE (GLUCOTROL) 5 MG tablet Take 1 tablet (5 mg total) by mouth daily before breakfast.  lisinopril (ZESTRIL) 20 MG tablet Take 1 tablet (20 mg total) by mouth daily.   methocarbamol (ROBAXIN-750) 750 MG tablet Take 1 tablet (750 mg total) by mouth 3 (three) times daily.   metoprolol succinate (TOPROL XL) 100 MG 24 hr tablet Take 1 tablet (100 mg total) by mouth daily. Take with or immediately following a meal.   nitroGLYCERIN (NITROSTAT) 0.3 MG SL tablet Place 0.3 mg under the tongue every 5 (five) minutes as needed for chest pain.   ondansetron (ZOFRAN-ODT) 4 MG disintegrating tablet Take 1 tablet (4 mg total) by mouth every 8 (eight) hours as needed for nausea or vomiting.    oxyCODONE-acetaminophen (PERCOCET) 5-325 MG tablet Take 1 tablet by mouth every 4 (four) hours as needed for severe pain.   pantoprazole (PROTONIX) 40 MG tablet Take 1 tablet (40 mg total) by mouth daily.   No facility-administered encounter medications on file as of 11/24/2022.    Social History: Social History   Tobacco Use   Smoking status: Former   Smokeless tobacco: Never  Advertising account planner   Vaping status: Never Used  Substance Use Topics   Alcohol use: Not Currently    Alcohol/week: 12.0 standard drinks of alcohol    Types: 12 Cans of beer per week   Drug use: Yes    Types: Marijuana    Family Medical History: No family history on file.  Physical Examination: There were no vitals filed for this visit.    Awake, alert, oriented to person, place, and time.  Speech is clear and fluent. Fund of knowledge is appropriate.   Cranial Nerves: Pupils equal round and reactive to light.  Facial tone is symmetric.    She has tenderness at TL junction. ROM of lumbar spine not tested.   No abnormal lesions on exposed skin.   Strength: Side Biceps Triceps Deltoid Interossei Grip Wrist Ext. Wrist Flex.  R 5 5 5 5 5 5 5   L 5 5 5 5 5 5 5    Side Iliopsoas Quads Hamstring PF DF EHL  R 5 5 5 5 5 5   L 5 5 5 5 5 5    Reflexes are 2+ and symmetric at the biceps, triceps, brachioradialis, and achilles.   Hoffman's is absent.  Clonus is not present.   Bilateral upper and lower extremity sensation is intact to light touch.     Gait is normal.     Medical Decision Making  Imaging: Lumbar xrays dated 10/13/22:  FINDINGS: Stable postoperative changes of L1 cement augmentation. Chronic compression fracture with Schmorl's node of the L2 vertebral body appears similar compared to prior. No definite acute fracture or malalignment. Vertebral body heights are otherwise maintained. Degenerative changes are again noted at L4-L5 and L5-S1. Bilateral lower lumbar facet arthropathy. Atherosclerotic  calcifications present in the abdominal aorta. Tubal ligation clips noted on the frontal view.   IMPRESSION: 1. No convincing evidence of acute fracture by conventional x-ray. 2. Stable postoperative changes of L1 cement augmentation. 3. Stable chronic compression fracture with Schmorl's node deformity at L2.     Electronically Signed   By: Malachy Moan M.D.   On: 10/13/2022 14:30   I have personally reviewed the images and agree with the above interpretation.  Assessment and Plan: Claudia Mills is a pleasant 52 y.o. female felt a pop in her back on 06/28/22 and was found to have acute L1 compression fracture and chronic L2 compression fracture.   She continues with constant LBP with no leg pain. Pain  is worse with prolonged standing, bending, and lifting. She is not wearing her brace.   Xrays from today show some progression of L1 fracture in comparison to CT. L2 fracture looks stable. She is neurologically intact on exam.   Treatment options discussed with patient and following plan made:   - MRI of lumbar spine to further evaluate L1 compression fracture. She continues with constant pain.  - DEXA scan- this is her second compression fracture without trauma. She will call [facility] to get this scheduled.  - Continue to wear TLSO when up and walking- she will call [facility] to make appointment to be sure it fits correctly. She does not have brace with her today.  - No bending, twisting, or lifting.  - She remains out of work. Note given that she will likely be out for 3 months s/p injury on 06/28/22.  - Phone visit to review MRI and DEXA. Depending on results, will likely refer to IR for kyphoplasty.   I spent a total of 35 minutes in face-to-face and non-face-to-face activities related to this patient's care today including review of outside records, review of imaging, review of symptoms, physical exam, discussion of differential diagnosis, discussion of treatment options,  and documentation.   Thank you for involving me in the care of this patient.   Drake Leach PA-C Dept. of Neurosurgery

## 2022-11-22 ENCOUNTER — Ambulatory Visit: Payer: Medicaid Other | Admitting: Orthopedic Surgery

## 2022-11-24 ENCOUNTER — Ambulatory Visit: Payer: Medicaid Other | Admitting: Orthopedic Surgery

## 2022-11-24 ENCOUNTER — Telehealth: Payer: Self-pay | Admitting: Orthopedic Surgery

## 2022-11-24 NOTE — Telephone Encounter (Signed)
Ok for work note? 

## 2022-11-24 NOTE — Telephone Encounter (Signed)
Claudia Mills has to leave early today. Patient was supposed to see her today and be released back to work. Can patient get a work note to be  out of work until her appt on Tuesday 11/29/2022.

## 2022-11-24 NOTE — Telephone Encounter (Signed)
Work note has been completed. 

## 2022-11-24 NOTE — Telephone Encounter (Signed)
Patient will pick up note on Tuesday at her appt.

## 2022-11-24 NOTE — Telephone Encounter (Signed)
Okay for work note until I see her on 10/15.

## 2022-11-25 NOTE — Progress Notes (Unsigned)
Referring Physician:  No referring provider defined for this encounter.  Primary Physician:  Claudia Morgan, MD  History of Present Illness: [name] has a history of HTN, CAD, and DM. Recent diagnosis of vertigo.   She had L1 compression fracture back on 06/28/22. DEXA showed osteoporosis. MRI also showed lumbar spondylosis with DDD, moderate central stenosis L4-L5 with left lateral recess stenosis and mild/moderate foraminal stenosis on left at L5-S1.   She was referred to IR and had L1 kyphoplasty on 10/06/22. She was doing well after her procedure with no pain until a week later when she tripped over her cat on 10/11/22.   She was seen in ED on 10/12/22. She saw IR on 10/13/22 and repeat xrays showed no acute compression fractures.   She was given a note keeping her out of work. She is here for follow up.   She had some improvement in her LBP until Sunday when she woke up with increased pain. No new injury. She has constant LBP with no leg pain. No numbness, tingling, or weakness in her legs. Pain is worse with bending, lifting.   She is taking motrin, neurontin, and oxycodone. Robaxin made her sick so she stopped it.   Bowel/Bladder Dysfunction: none  Conservative measures:  Physical therapy: has not participated in Multimodal medical therapy including regular antiinflammatories: percocet   Injections: has not received epidural steroid injections  Past Surgery: L1 kyphoplasty on 10/06/22  Claudia Mills has no symptoms of cervical myelopathy.  The symptoms are causing a significant impact on the patient's life.   Review of Systems:  A 10 point review of systems is negative, except for the pertinent positives and negatives detailed in the HPI.  Past Medical History: Past Medical History:  Diagnosis Date   Diabetes mellitus without complication (HCC)    Hypertension    MI (myocardial infarction) (HCC)     Past Surgical History: Past Surgical History:  Procedure  Laterality Date   CORONARY ANGIOPLASTY WITH STENT PLACEMENT     IR KYPHO LUMBAR INC FX REDUCE BONE BX UNI/BIL CANNULATION INC/IMAGING  10/06/2022   IR RADIOLOGIST EVAL & MGMT  09/20/2022   IR RADIOLOGIST EVAL & MGMT  10/13/2022   IR RADIOLOGIST EVAL & MGMT  10/20/2022    Allergies: Allergies as of 11/29/2022 - Review Complete 10/21/2022  Allergen Reaction Noted   Bee venom Anaphylaxis 04/07/2021    Medications: Outpatient Encounter Medications as of 11/29/2022  Medication Sig   aspirin EC 81 MG tablet Take 81 mg by mouth daily. Swallow whole.   atorvastatin (LIPITOR) 80 MG tablet Take 1 tablet (80 mg total) by mouth daily. (Patient not taking: Reported on 09/20/2022)   BRILINTA 90 MG TABS tablet Take 90 mg by mouth 2 (two) times daily.   DULoxetine (CYMBALTA) 30 MG capsule Take 30 mg by mouth daily.   Empagliflozin (JARDIANCE PO) Take 25 mg by mouth daily.   gabapentin (NEURONTIN) 300 MG capsule Take 300 mg by mouth 3 (three) times daily.   glipiZIDE (GLUCOTROL) 5 MG tablet Take 1 tablet (5 mg total) by mouth daily before breakfast.   lisinopril (ZESTRIL) 20 MG tablet Take 1 tablet (20 mg total) by mouth daily.   methocarbamol (ROBAXIN-750) 750 MG tablet Take 1 tablet (750 mg total) by mouth 3 (three) times daily.   metoprolol succinate (TOPROL XL) 100 MG 24 hr tablet Take 1 tablet (100 mg total) by mouth daily. Take with or immediately following a meal.   nitroGLYCERIN (  NITROSTAT) 0.3 MG SL tablet Place 0.3 mg under the tongue every 5 (five) minutes as needed for chest pain.   ondansetron (ZOFRAN-ODT) 4 MG disintegrating tablet Take 1 tablet (4 mg total) by mouth every 8 (eight) hours as needed for nausea or vomiting.   oxyCODONE-acetaminophen (PERCOCET) 5-325 MG tablet Take 1 tablet by mouth every 4 (four) hours as needed for severe pain.   pantoprazole (PROTONIX) 40 MG tablet Take 1 tablet (40 mg total) by mouth daily.   No facility-administered encounter medications on file as of  11/29/2022.    Social History: Social History   Tobacco Use   Smoking status: Former   Smokeless tobacco: Never  Advertising account planner   Vaping status: Never Used  Substance Use Topics   Alcohol use: Not Currently    Alcohol/week: 12.0 standard drinks of alcohol    Types: 12 Cans of beer per week   Drug use: Yes    Types: Marijuana    Family Medical History: No family history on file.  Physical Examination: There were no vitals filed for this visit.    Awake, alert, oriented to person, place, and time.  Speech is clear and fluent. Fund of knowledge is appropriate.   Cranial Nerves: Pupils equal round and reactive to light.  Facial tone is symmetric.    She has tenderness mid lower lumbar region. No thoracic tenderness.   No abnormal lesions on exposed skin.   Strength: Side Iliopsoas Quads Hamstring PF DF EHL  R 5 5 5 5 5 5   L 5 5 5 5 5 5    Clonus is not present.   Bilateral lower extremity sensation is intact to light touch, slightly diminished medial ankle bilaterally.   Gait is normal.     Medical Decision Making  Imaging: Lumbar xrays dated 10/13/22:  FINDINGS: Stable postoperative changes of L1 cement augmentation. Chronic compression fracture with Schmorl's node of the L2 vertebral body appears similar compared to prior. No definite acute fracture or malalignment. Vertebral body heights are otherwise maintained. Degenerative changes are again noted at L4-L5 and L5-S1. Bilateral lower lumbar facet arthropathy. Atherosclerotic calcifications present in the abdominal aorta. Tubal ligation clips noted on the frontal view.   IMPRESSION: 1. No convincing evidence of acute fracture by conventional x-ray. 2. Stable postoperative changes of L1 cement augmentation. 3. Stable chronic compression fracture with Schmorl's node deformity at L2.     Electronically Signed   By: Claudia Mills M.D.   On: 10/13/2022 14:30   I have personally reviewed the images and  agree with the above interpretation.  Assessment and Plan: [name] is a pleasant 52 y.o. female who had L1 kyphoplasty on 10/06/22. She was doing well after her procedure with no pain until a week later when she tripped over her cat on 10/11/22.   She had some improvement in her LBP until Sunday when she woke up with increased pain. No new injury. She has constant LBP with no leg pain. No numbness, tingling, or weakness in her legs.   Xrays from 10/13/22 (after procedure) show no new compression fractures. Previous MRI showed lumbar spondylosis with DDD, moderate central stenosis L4-L5 with left lateral recess stenosis and mild/moderate foraminal stenosis on left at L5-S1.   LBP is likely due to underlying spondylosis.   Treatment options discussed with patient and following plan made:   - Referral to pain management to consider injections.  - PT discussed and she declined for now. May revisit when pain is better.  -  New prescription for flexeril. Reviewed dosing and side effects. Can make her sleepy. Robaxin stopped at it caused GI upset.  - Given small prescription of oxycodone for severe pain. This will be last prescription. PMP reviewed and is appropriate.  - She does bending, twisting, and lifting at work (packs socks). Note keeping her out until follow up with me.  - I will see her back in 6-8 weeks and prn.  - If no better, may consider updated imaging.   I spent a total of 20 minutes in face-to-face and non-face-to-face activities related to this patient's care today including review of outside records, review of imaging, review of symptoms, physical exam, discussion of differential diagnosis, discussion of treatment options, and documentation.   Drake Leach PA-C Dept. of Neurosurgery

## 2022-11-29 ENCOUNTER — Encounter: Payer: Self-pay | Admitting: Orthopedic Surgery

## 2022-11-29 ENCOUNTER — Ambulatory Visit (INDEPENDENT_AMBULATORY_CARE_PROVIDER_SITE_OTHER): Payer: Medicaid Other | Admitting: Orthopedic Surgery

## 2022-11-29 VITALS — BP 138/82 | Ht 63.0 in | Wt 120.0 lb

## 2022-11-29 DIAGNOSIS — M47816 Spondylosis without myelopathy or radiculopathy, lumbar region: Secondary | ICD-10-CM | POA: Diagnosis not present

## 2022-11-29 DIAGNOSIS — S32010D Wedge compression fracture of first lumbar vertebra, subsequent encounter for fracture with routine healing: Secondary | ICD-10-CM | POA: Diagnosis not present

## 2022-11-29 DIAGNOSIS — M5136 Other intervertebral disc degeneration, lumbar region with discogenic back pain only: Secondary | ICD-10-CM

## 2022-11-29 DIAGNOSIS — M48061 Spinal stenosis, lumbar region without neurogenic claudication: Secondary | ICD-10-CM

## 2022-11-29 DIAGNOSIS — S32010S Wedge compression fracture of first lumbar vertebra, sequela: Secondary | ICD-10-CM

## 2022-11-29 MED ORDER — CYCLOBENZAPRINE HCL 10 MG PO TABS
10.0000 mg | ORAL_TABLET | Freq: Three times a day (TID) | ORAL | 0 refills | Status: DC | PRN
Start: 2022-11-29 — End: 2023-02-16

## 2022-11-29 MED ORDER — OXYCODONE HCL 5 MG PO TABS
5.0000 mg | ORAL_TABLET | Freq: Two times a day (BID) | ORAL | 0 refills | Status: DC | PRN
Start: 2022-11-29 — End: 2023-02-16

## 2022-11-29 NOTE — Patient Instructions (Signed)
It was so nice to see you today. Thank you so much for coming in.    You have some wear and tear in your back (arthritis) and this may be causing your pain.   I sent a prescription for oxycodone to help with severe pain. Take only as needed. This can make you sleepy and/or constipated.   I also sent a prescription for cyclobenzaprine to help with muscle spasms. Use only as needed and be careful, this can make you sleepy.  I want you to see pain management here in [place] (Dr. Cherylann Ratel) to discuss possible lumbar injections. They should call you to schedule an appointment or you can call them at [phone].   I gave you a work note.   Please do not hesitate to call if you have any questions or concerns. You can also message me in MyChart.   Drake Leach PA-C [phone]      The physicians and staff at [facility] are committed to providing excellent care. You may receive a survey requesting feedback about your experience at our office. We strive to receive "very good" responses to the survey questions. If you feel that your experience would prevent you from giving the office a "very good " response, please contact our office to try to remedy the situation. We may be reached at [phone]. Thank you for taking the time out of your busy day to complete the survey.

## 2022-12-27 ENCOUNTER — Telehealth: Payer: Self-pay

## 2022-12-27 ENCOUNTER — Ambulatory Visit
Payer: Medicaid Other | Attending: Student in an Organized Health Care Education/Training Program | Admitting: Student in an Organized Health Care Education/Training Program

## 2022-12-27 ENCOUNTER — Encounter: Payer: Self-pay | Admitting: Student in an Organized Health Care Education/Training Program

## 2022-12-27 VITALS — BP 130/73 | HR 124 | Temp 97.4°F | Ht 66.0 in | Wt 120.0 lb

## 2022-12-27 DIAGNOSIS — M47816 Spondylosis without myelopathy or radiculopathy, lumbar region: Secondary | ICD-10-CM | POA: Diagnosis not present

## 2022-12-27 DIAGNOSIS — G8929 Other chronic pain: Secondary | ICD-10-CM | POA: Insufficient documentation

## 2022-12-27 DIAGNOSIS — S32010S Wedge compression fracture of first lumbar vertebra, sequela: Secondary | ICD-10-CM | POA: Diagnosis not present

## 2022-12-27 DIAGNOSIS — M545 Low back pain, unspecified: Secondary | ICD-10-CM | POA: Diagnosis not present

## 2022-12-27 DIAGNOSIS — S32010A Wedge compression fracture of first lumbar vertebra, initial encounter for closed fracture: Secondary | ICD-10-CM | POA: Insufficient documentation

## 2022-12-27 NOTE — Telephone Encounter (Signed)
Dr Cherylann Ratel would like to have clearance to stop the Brillinta for 5 days for a Lumbar Facet block.  Thank you

## 2022-12-27 NOTE — Patient Instructions (Signed)
You have been referred to physical therapy  GENERAL RISKS AND COMPLICATIONS  What are the risk, side effects and possible complications? Generally speaking, most procedures are safe.  However, with any procedure there are risks, side effects, and the possibility of complications.  The risks and complications are dependent upon the sites that are lesioned, or the type of nerve block to be performed.  The closer the procedure is to the spine, the more serious the risks are.  Great care is taken when placing the radio frequency needles, block needles or lesioning probes, but sometimes complications can occur. Infection: Any time there is an injection through the skin, there is a risk of infection.  This is why sterile conditions are used for these blocks.  There are four possible types of infection. Localized skin infection. Central Nervous System Infection-This can be in the form of Meningitis, which can be deadly. Epidural Infections-This can be in the form of an epidural abscess, which can cause pressure inside of the spine, causing compression of the spinal cord with subsequent paralysis. This would require an emergency surgery to decompress, and there are no guarantees that the patient would recover from the paralysis. Discitis-This is an infection of the intervertebral discs.  It occurs in about 1% of discography procedures.  It is difficult to treat and it may lead to surgery.        2. Pain: the needles have to go through skin and soft tissues, will cause soreness.       3. Damage to internal structures:  The nerves to be lesioned may be near blood vessels or    other nerves which can be potentially damaged.       4. Bleeding: Bleeding is more common if the patient is taking blood thinners such as  aspirin, Coumadin, Ticiid, Plavix, etc., or if he/she have some genetic predisposition  such as hemophilia. Bleeding into the spinal canal can cause compression of the spinal  cord with subsequent  paralysis.  This would require an emergency surgery to  decompress and there are no guarantees that the patient would recover from the  paralysis.       5. Pneumothorax:  Puncturing of a lung is a possibility, every time a needle is introduced in  the area of the chest or upper back.  Pneumothorax refers to free air around the  collapsed lung(s), inside of the thoracic cavity (chest cavity).  Another two possible  complications related to a similar event would include: Hemothorax and Chylothorax.   These are variations of the Pneumothorax, where instead of air around the collapsed  lung(s), you may have blood or chyle, respectively.       6. Spinal headaches: They may occur with any procedures in the area of the spine.       7. Persistent CSF (Cerebro-Spinal Fluid) leakage: This is a rare problem, but may occur  with prolonged intrathecal or epidural catheters either due to the formation of a fistulous  track or a dural tear.       8. Nerve damage: By working so close to the spinal cord, there is always a possibility of  nerve damage, which could be as serious as a permanent spinal cord injury with  paralysis.       9. Death:  Although rare, severe deadly allergic reactions known as "Anaphylactic  reaction" can occur to any of the medications used.      10. Worsening of the symptoms:  We can always  make thing worse.  What are the chances of something like this happening? Chances of any of this occuring are extremely low.  By statistics, you have more of a chance of getting killed in a motor vehicle accident: while driving to the hospital than any of the above occurring .  Nevertheless, you should be aware that they are possibilities.  In general, it is similar to taking a shower.  Everybody knows that you can slip, hit your head and get killed.  Does that mean that you should not shower again?  Nevertheless always keep in mind that statistics do not mean anything if you happen to be on the wrong side of  them.  Even if a procedure has a 1 (one) in a 1,000,000 (million) chance of going wrong, it you happen to be that one..Also, keep in mind that by statistics, you have more of a chance of having something go wrong when taking medications.  Who should not have this procedure? If you are on a blood thinning medication (e.g. Coumadin, Plavix, see list of "Blood Thinners"), or if you have an active infection going on, you should not have the procedure.  If you are taking any blood thinners, please inform your physician.  How should I prepare for this procedure? Do not eat or drink anything at least six hours prior to the procedure. Bring a driver with you .  It cannot be a taxi. Come accompanied by an adult that can drive you back, and that is strong enough to help you if your legs get weak or numb from the local anesthetic. Take all of your medicines the morning of the procedure with just enough water to swallow them. If you have diabetes, make sure that you are scheduled to have your procedure done first thing in the morning, whenever possible. If you have diabetes, take only half of your insulin dose and notify our nurse that you have done so as soon as you arrive at the clinic. If you are diabetic, but only take blood sugar pills (oral hypoglycemic), then do not take them on the morning of your procedure.  You may take them after you have had the procedure. Do not take aspirin or any aspirin-containing medications, at least eleven (11) days prior to the procedure.  They may prolong bleeding. Wear loose fitting clothing that may be easy to take off and that you would not mind if it got stained with Betadine or blood. Do not wear any jewelry or perfume Remove any nail coloring.  It will interfere with some of our monitoring equipment.  NOTE: Remember that this is not meant to be interpreted as a complete list of all possible complications.  Unforeseen problems may occur.  BLOOD THINNERS The  following drugs contain aspirin or other products, which can cause increased bleeding during surgery and should not be taken for 2 weeks prior to and 1 week after surgery.  If you should need take something for relief of minor pain, you may take acetaminophen which is found in Tylenol,m Datril, Anacin-3 and Panadol. It is not blood thinner. The products listed below are.  Do not take any of the products listed below in addition to any listed on your instruction sheet.  A.P.C or A.P.C with Codeine Codeine Phosphate Capsules #3 Ibuprofen Ridaura  ABC compound Congesprin Imuran rimadil  Advil Cope Indocin Robaxisal  Alka-Seltzer Effervescent Pain Reliever and Antacid Coricidin or Coricidin-D  Indomethacin Rufen  Alka-Seltzer plus Cold Medicine Cosprin Ketoprofen S-A-C  Tablets  Anacin Analgesic Tablets or Capsules Coumadin Korlgesic Salflex  Anacin Extra Strength Analgesic tablets or capsules CP-2 Tablets Lanoril Salicylate  Anaprox Cuprimine Capsules Levenox Salocol  Anexsia-D Dalteparin Magan Salsalate  Anodynos Darvon compound Magnesium Salicylate Sine-off  Ansaid Dasin Capsules Magsal Sodium Salicylate  Anturane Depen Capsules Marnal Soma  APF Arthritis pain formula Dewitt's Pills Measurin Stanback  Argesic Dia-Gesic Meclofenamic Sulfinpyrazone  Arthritis Bayer Timed Release Aspirin Diclofenac Meclomen Sulindac  Arthritis pain formula Anacin Dicumarol Medipren Supac  Analgesic (Safety coated) Arthralgen Diffunasal Mefanamic Suprofen  Arthritis Strength Bufferin Dihydrocodeine Mepro Compound Suprol  Arthropan liquid Dopirydamole Methcarbomol with Aspirin Synalgos  ASA tablets/Enseals Disalcid Micrainin Tagament  Ascriptin Doan's Midol Talwin  Ascriptin A/D Dolene Mobidin Tanderil  Ascriptin Extra Strength Dolobid Moblgesic Ticlid  Ascriptin with Codeine Doloprin or Doloprin with Codeine Momentum Tolectin  Asperbuf Duoprin Mono-gesic Trendar  Aspergum Duradyne Motrin or Motrin IB Triminicin   Aspirin plain, buffered or enteric coated Durasal Myochrisine Trigesic  Aspirin Suppositories Easprin Nalfon Trillsate  Aspirin with Codeine Ecotrin Regular or Extra Strength Naprosyn Uracel  Atromid-S Efficin Naproxen Ursinus  Auranofin Capsules Elmiron Neocylate Vanquish  Axotal Emagrin Norgesic Verin  Azathioprine Empirin or Empirin with Codeine Normiflo Vitamin E  Azolid Emprazil Nuprin Voltaren  Bayer Aspirin plain, buffered or children's or timed BC Tablets or powders Encaprin Orgaran Warfarin Sodium  Buff-a-Comp Enoxaparin Orudis Zorpin  Buff-a-Comp with Codeine Equegesic Os-Cal-Gesic   Buffaprin Excedrin plain, buffered or Extra Strength Oxalid   Bufferin Arthritis Strength Feldene Oxphenbutazone   Bufferin plain or Extra Strength Feldene Capsules Oxycodone with Aspirin   Bufferin with Codeine Fenoprofen Fenoprofen Pabalate or Pabalate-SF   Buffets II Flogesic Panagesic   Buffinol plain or Extra Strength Florinal or Florinal with Codeine Panwarfarin   Buf-Tabs Flurbiprofen Penicillamine   Butalbital Compound Four-way cold tablets Penicillin   Butazolidin Fragmin Pepto-Bismol   Carbenicillin Geminisyn Percodan   Carna Arthritis Reliever Geopen Persantine   Carprofen Gold's salt Persistin   Chloramphenicol Goody's Phenylbutazone   Chloromycetin Haltrain Piroxlcam   Clmetidine heparin Plaquenil   Cllnoril Hyco-pap Ponstel   Clofibrate Hydroxy chloroquine Propoxyphen         Before stopping any of these medications, be sure to consult the physician who ordered them.  Some, such as Coumadin (Warfarin) are ordered to prevent or treat serious conditions such as "deep thrombosis", "pumonary embolisms", and other heart problems.  The amount of time that you may need off of the medication may also vary with the medication and the reason for which you were taking it.  If you are taking any of these medications, please make sure you notify your pain physician before you undergo any  procedures.         Moderate Conscious Sedation, Adult Sedation is the use of medicines to help you relax and not feel pain. Moderate conscious sedation is a type of sedation that makes you less alert than normal. You are still able to respond to instructions, touch, or both. This type of sedation is used during short medical and dental procedures. It is milder than deep sedation, which is a type of sedation you cannot be easily woken up from. It is also milder than general anesthesia, which is the use of medicines to make you fall asleep. Moderate conscious sedation lets you return to your normal activities sooner. Tell a health care provider about: Any allergies you have. All medicines you are taking, including vitamins, herbs, steroids, eye drops, creams, and over-the-counter medicines. Any problems  you or family members have had with anesthesia. Any bleeding problems you have. Any surgeries you have had. Any medical conditions you have. Whether you are pregnant or may be pregnant. Any recent alcohol, tobacco, or drug use. What are the risks? Your health care provider will talk with you about risks. These may include: Oversedation. This is when you get too much medicine. Nausea or vomiting. Allergic reaction to medicines. Trouble breathing. If this happens, a breathing tube may be used. It will be removed when you can breathe better on your own. Heart trouble. Lung trouble. Emergence delirium. This is when you feel confused while the sedation wears off. This gets better with time. What happens before the procedure? When to stop eating and drinking Follow instructions from your health care provider about what you may eat and drink. These may include: 8 hours before your procedure Stop eating most foods. Do not eat meat, fried foods, or fatty foods. Eat only light foods, such as toast or crackers. All liquids are okay except energy drinks and alcohol. 6 hours before your  procedure Stop eating. Drink only clear liquids, such as water, clear fruit juice, black coffee, plain tea, and sports drinks. Do not drink energy drinks or alcohol. 2 hours before your procedure Stop drinking all liquids. You may be allowed to take medicines with small sips of water. If you do not follow your health care provider's instructions, your procedure may be delayed or canceled. Medicines Ask your health care provider about: Changing or stopping your regular medicines. These include any diabetes medicines or blood thinners you take. Taking medicines such as aspirin and ibuprofen. These medicines can thin your blood. Do not take them unless your health care provider tells you to. Taking over-the-counter medicines, vitamins, herbs, and supplements. Tests and exams You may have an exam or testing. You may have a blood or urine sample taken. General instructions Do not use any products that contain nicotine or tobacco for at least 4 weeks before the procedure. These products include cigarettes, chewing tobacco, and vaping devices, such as e-cigarettes. If you need help quitting, ask your health care provider. If you will be going home right after the procedure, plan to have a responsible adult: Take you home from the hospital or clinic. You will not be allowed to drive. Care for you for the time you are told. What happens during the procedure?  You will be given the sedative. It may be given: As a pill you can take by mouth. It can also be put into the rectum. As a spray through the nose. As an injection into muscle. As an injection into a vein through an IV. You may be given oxygen as needed. Your blood pressure, heart rate, breathing rate, and blood oxygen level will be monitored during the procedure. The medical or dental procedure will be done. The procedure may vary among health care providers and hospitals. What happens after the procedure? Your blood pressure, heart  rate, breathing rate, and blood oxygen level will be monitored until you leave the hospital or clinic. You will get fluids through an IV as needed. Do not drive or operate machinery until your health care provider says that it is safe. This information is not intended to replace advice given to you by your health care provider. Make sure you discuss any questions you have with your health care provider. Document Revised: 08/16/2021 Document Reviewed: 08/16/2021 [employer] Patient Education  2024 [employer] Facet Blocks Patient Information  Description:  The facets are joints in the spine between the vertebrae.  Like any joints in the body, facets can become irritated and painful.  Arthritis can also effect the facets.  By injecting steroids and local anesthetic in and around these joints, we can temporarily block the nerve supply to them.  Steroids act directly on irritated nerves and tissues to reduce selling and inflammation which often leads to decreased pain.  Facet blocks may be done anywhere along the spine from the neck to the low back depending upon the location of your pain.   After numbing the skin with local anesthetic (like Novocaine), a small needle is passed onto the facet joints under x-ray guidance.  You may experience a sensation of pressure while this is being done.  The entire block usually lasts about 15-25 minutes.   Conditions which may be treated by facet blocks:  Low back/buttock pain Neck/shoulder pain Certain types of headaches  Preparation for the injection:  Do not eat any solid food or dairy products within 8 hours of your appointment. You may drink clear liquid up to 3 hours before appointment.  Clear liquids include water, black coffee, juice or soda.  No milk or cream please. You may take your regular medication, including pain medications, with a sip of water before your appointment.  Diabetics should hold regular insulin (if taken separately) and take 1/2  normal NPH dose the morning of the procedure.  Carry some sugar containing items with you to your appointment. A driver must accompany you and be prepared to drive you home after your procedure. Bring all your current medications with you. An IV may be inserted and sedation may be given at the discretion of the physician. A blood pressure cuff, EKG and other monitors will often be applied during the procedure.  Some patients may need to have extra oxygen administered for a short period. You will be asked to provide medical information, including your allergies and medications, prior to the procedure.  We must know immediately if you are taking blood thinners (like Coumadin/Warfarin) or if you are allergic to IV iodine contrast (dye).  We must know if you could possible be pregnant.  Possible side-effects:  Bleeding from needle site Infection (rare, may require surgery) Nerve injury (rare) Numbness & tingling (temporary) Difficulty urinating (rare, temporary) Spinal headache (a headache worse with upright posture) Light-headedness (temporary) Pain at injection site (serveral days) Decreased blood pressure (rare, temporary) Weakness in arm/leg (temporary) Pressure sensation in back/neck (temporary)   Call if you experience:  Fever/chills associated with headache or increased back/neck pain Headache worsened by an upright position New onset, weakness or numbness of an extremity below the injection site Hives or difficulty breathing (go to the emergency room) Inflammation or drainage at the injection site(s) Severe back/neck pain greater than usual New symptoms which are concerning to you  Please note:  Although the local anesthetic injected can often make your back or neck feel good for several hours after the injection, the pain will likely return. It takes 3-7 days for steroids to work.  You may not notice any pain relief for at least one week.  If effective, we will often do a  series of 2-3 injections spaced 3-6 weeks apart to maximally decrease your pain.  After the initial series, you may be a candidate for a more permanent nerve block of the facets.  If you have any questions, please call #[phone] [facility]

## 2022-12-27 NOTE — Progress Notes (Signed)
Patient: Claudia Mills  Service Category: E/M  Provider: Edward Jolly, MD  DOB: 03-Oct-1970  DOS: 12/27/2022  Referring Provider: Gardiner Rhyme  MRN: [id number]  Setting: Ambulatory outpatient  PCP: Emogene Morgan, MD  Type: New Patient  Specialty: Interventional Pain Management    Location: Office  Delivery: Face-to-face     Primary Reason(s) for Visit: Encounter for initial evaluation of one or more chronic problems (new to examiner) potentially causing chronic pain, and posing a threat to normal musculoskeletal function. (Level of risk: High) CC: Back Pain (Lower left back)  HPI  Claudia Mills is a 52 y.o. year old, female patient, who comes for the first time to our practice referred by Drake Leach, PA-C for our initial evaluation of her chronic pain. She has Nausea and vomiting; Essential hypertension; NSTEMI (non-ST elevated myocardial infarction) (HCC); DKA, type 2, not at goal [facility]); Hyperglycemia; Dehydration; Lumbar facet arthropathy; Compression fracture of L1 lumbar vertebra (HCC); and Chronic bilateral low back pain without sciatica on their problem list. Today she comes in for evaluation of her Back Pain (Lower left back)  Pain Assessment: Location: Left, Lower Back Radiating: denies Onset: More than a month ago Duration: Chronic pain Quality: Constant, Sharp Severity: 4 /10 (subjective, self-reported pain score)  Effect on ADL: limits ADLS Timing: Constant Modifying factors: meds BP: 130/73  HR: (!) 124  Onset and Duration: Date of onset: 06-28-2022 Cause of pain:  other Severity: NAS-11 at its worse: 10/10, NAS-11 at its best: 2/10, NAS-11 now: 5/10, and NAS-11 on the average: 6/10 Timing: Morning, Night, During activity or exercise, After activity or exercise, and After a period of immobility Aggravating Factors: Bending, Lifiting, Prolonged sitting, Prolonged standing, Twisting, and Walking uphill Alleviating Factors: Lying down, Resting, Sitting, and Warm showers or  baths Associated Problems: Spasms and Pain that wakes patient up Quality of Pain: Aching, Sharp, and Uncomfortable Previous Examinations or Tests: CT scan, MRI scan, and X-rays Previous Treatments: Narcotic medications  Claudia Mills is being evaluated for possible interventional pain management therapies for the treatment of her chronic pain.   Patient is a pleasant 52 year old female who presents with a chief complaint of axial low back pain.  Of note she has a history of L1 compression fracture on 06/28/2022 status post L1 kyphoplasty on 10/06/2022.  She was doing well after her procedure until a week later when she tripped over her cat.  She was seen in the ED afterwards, repeat x-rays did not reveal any acute compression fractures related to her fall.  Her primary issue today is low back pain below her compression fracture.  MRI shows lumbar spondylosis at L3, L4 and L5.  I also discussed the importance of physical therapy and she is interested in pursuing this.  Meds   Current Outpatient Medications:    aspirin EC 81 MG tablet, Take 81 mg by mouth daily. Swallow whole., Disp: , Rfl:    BRILINTA 90 MG TABS tablet, Take 90 mg by mouth 2 (two) times daily., Disp: , Rfl:    cyclobenzaprine (FLEXERIL) 10 MG tablet, Take 1 tablet (10 mg total) by mouth 3 (three) times daily as needed for muscle spasms. This can make you sleepy., Disp: 60 tablet, Rfl: 0   DULoxetine (CYMBALTA) 30 MG capsule, Take 30 mg by mouth daily., Disp: , Rfl:    Empagliflozin (JARDIANCE PO), Take 25 mg by mouth daily., Disp: , Rfl:    gabapentin (NEURONTIN) 300 MG capsule, Take 300 mg by mouth 3 (three)  times daily., Disp: , Rfl:    glipiZIDE (GLUCOTROL) 5 MG tablet, Take 1 tablet (5 mg total) by mouth daily before breakfast., Disp: 90 tablet, Rfl: 0   ibuprofen (ADVIL) 800 MG tablet, Take 800 mg by mouth 3 (three) times daily., Disp: , Rfl:    lisinopril (ZESTRIL) 20 MG tablet, Take 1 tablet (20 mg total) by mouth daily., Disp:  30 tablet, Rfl: 1   methocarbamol (ROBAXIN-750) 750 MG tablet, Take 1 tablet (750 mg total) by mouth 3 (three) times daily., Disp: 30 tablet, Rfl: 0   metoprolol succinate (TOPROL XL) 100 MG 24 hr tablet, Take 1 tablet (100 mg total) by mouth daily. Take with or immediately following a meal., Disp: 30 tablet, Rfl: 11   nitroGLYCERIN (NITROSTAT) 0.3 MG SL tablet, Place 0.3 mg under the tongue every 5 (five) minutes as needed for chest pain., Disp: , Rfl:    ondansetron (ZOFRAN-ODT) 4 MG disintegrating tablet, Take 1 tablet (4 mg total) by mouth every 8 (eight) hours as needed for nausea or vomiting., Disp: 20 tablet, Rfl: 0   oxyCODONE (ROXICODONE) 5 MG immediate release tablet, Take 1 tablet (5 mg total) by mouth every 12 (twelve) hours as needed., Disp: 10 tablet, Rfl: 0   pantoprazole (PROTONIX) 40 MG tablet, Take 1 tablet (40 mg total) by mouth daily. (Patient not taking: Reported on 12/27/2022), Disp: 30 tablet, Rfl: 0  Imaging Review   DG Shoulder Right  Narrative CLINICAL DATA:  Right shoulder pain  EXAM: RIGHT SHOULDER - 2+ VIEW  COMPARISON:  None.  FINDINGS: No acute fracture or dislocation identified. Mild narrowing of the glenohumeral joint. No soft tissue abnormality visualized.  IMPRESSION: No acute osseous abnormality identified.   Electronically Signed By: Jannifer Hick M.D. On: 02/11/2021 09:04   MR LUMBAR SPINE WO CONTRAST  Narrative CLINICAL DATA:  Low back pain since 05/14 when patient felt a pop in her back.  EXAM: MRI LUMBAR SPINE WITHOUT CONTRAST  TECHNIQUE: Multiplanar, multisequence MR imaging of the lumbar spine was performed. No intravenous contrast was administered.  COMPARISON:  Lumbar CT 06/28/2022  FINDINGS: Segmentation:  Standard.  Alignment:  Physiologic.  Vertebrae: Nonacute but unhealed L1 compression fracture with progressive and advanced height loss and diffuse marrow edema. Posterior and superior corner has retropulsed by  4 mm.  Edematous signal at the tips of the T12 and L1 spinous processes which is likely reactive.  L2 inferior endplate fracture with moderate depression. Mild superimposed edema is likely reactive at the site of chronic fracturing.  Mild reactive appearing edema at the T12 anterior inferior endplate.  No evidence of aggressive bone lesion or infection.  Conus medullaris and cauda equina: Conus extends to the L1-2 level. Conus and cauda equina appear normal.  Paraspinal and other soft tissues: Mild perivertebral edema at the level of L1 fracture.  Disc levels:  T12- L1: Posttraumatic distortion of the disc.  L1-L2: Unremarkable.  L2-L3: Posttraumatic distortion of the disc with mild desiccation and bulging.  L3-L4: Central disc protrusion causing moderate thecal sac stenosis. Negative facets and patent foramina  L4-L5: Disc collapse with central herniation causing moderate spinal stenosis and impacting the right more than left L5 nerve roots at the subarticular recesses. The foramina are patent  L5-S1:Disc collapse with left eccentric protrusion impinging on the left S1 nerve root. Disc height loss and endplate ridging causes mild to moderate foraminal narrowing on the left.  IMPRESSION: 1. Nonacute but unhealed L1 compression fracture with progressed and advanced height  loss since 06/28/2022. There is also been retropulsion of the posterosuperior corner mild indentation of the ventral cord. 2. Mild edema in the T12, L1 spinous processes and T12 body which is likely reactive. 3. Remote L2 inferior endplate fracture. 4. 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 At L4-5 there is asymmetric impingement of the right L5 nerve root the subarticular recess.   Electronically Signed By: Tiburcio Pea M.D. On: 08/30/2022 05:47    CT Lumbar Spine Wo Contrast  Narrative CLINICAL DATA:  Provided history:  Compression fracture, lumbar. History of L2 vertebral compression fracture.  EXAM: CT LUMBAR SPINE WITHOUT CONTRAST  TECHNIQUE: Multidetector CT imaging of the lumbar spine was performed without intravenous contrast administration. Multiplanar CT image reconstructions were also generated.  RADIATION DOSE REDUCTION: This exam was performed according to the departmental dose-optimization program which includes automated exposure control, adjustment of the mA and/or kV according to patient size and/or use of iterative reconstruction technique.  COMPARISON:  Lumbar spine CT 04/07/2021. Lumbar spine radiographs 04/07/2021.  FINDINGS: Segmentation: 5 lumbar vertebrae. The caudal most well-formed intervertebral disc space is designated L5-S1.  Alignment: 2 mm bony retropulsion at the level of the L1 superior endplate. 2 mm bony retropulsion at the level of the L2 inferior endplate  Vertebrae: L1 vertebral compression fracture (30-40% height loss) with vertically oriented fracture through the vertebral body, new from the prior lumbar spine CT of 04/07/2021 and acute in appearance. Height loss at site of an L2 inferior endplate compression fracture has progressed from the prior lumbar spine CT of 04/07/2021 (now 40-50%). This is age indeterminate, but favored chronic given the degree of sclerosis along the L2 inferior endplate. Lumbar vertebral body height is otherwise maintained. Degenerative endplate sclerosis at L4-L5 and L5-S1.  Paraspinal and other soft tissues: Aortoiliac atherosclerosis. Punctate nonobstructing bilateral renal calculi. Distended urinary bladder, incompletely imaged. No paraspinal hematoma.  Disc levels:  Multilevel disc space narrowing, greatest at L4-L5 (advanced) and L5-S1 (moderate to advanced). Disc vacuum phenomenon at L2-L3, L4-L5 and L5-S1.  T12-L1: Mild bony retropulsion at the level of the L1 superior endplate. No significant disc herniation or  stenosis.  L1-L2: No significant disc herniation or stenosis.  L2-L3: Mild bony retropulsion at the level of the L2 inferior endplate, slightly progressed. Progressive disc bulge. Facet arthrosis and ligamentum flavum hypertrophy. Right greater than left subarticular narrowing. Mild narrowing of the central canal. No significant foraminal stenosis.  L3-L4: Disc bulge. Superimposed broad-based disc protrusion spanning the central and bilateral subarticular zones. Facet arthrosis and ligamentum flavum hypertrophy. Bilateral subarticular narrowing. Moderate central canal stenosis. No significant foraminal stenosis. As before, there is a large anterior disc extrusion near midline which contacts the aorta.  L4-L5: Disc bulge with endplate osteophytes. Superimposed moderately large central disc extrusion with mild caudal migration. Facet arthrosis and ligamentum flavum hypertrophy. Severe bilateral subarticular and central canal stenosis. Bilateral neural foraminal narrowing (moderate/severe right, mild left).  L5-S1: Disc bulge with endplate spurring. Superimposed moderately enlarged central/left subarticular disc extrusion with mild cranial migration. Facet arthrosis and ligamentum flavum hypertrophy. The disc extrusion results in severe left subarticular stenosis and likely encroaches upon the descending left S1 nerve root. Mild right subarticular stenosis. Moderate/severe bilateral neural foraminal narrowing.  IMPRESSION: 1. L1 compression fracture (30-40% height loss) with vertically-oriented fracture through the vertebral body, new from the prior lumbar spine CT of 04/07/2021 and acute in appearance. 2. L2 inferior endplate vertebral compression fracture with progressive height loss as  compared to the prior lumbar spine CT (now 40-50%). This is age-indeterminate but favored chronic given the degree of sclerosis at this site. 3. Apart from mild progression of a disc bulge at  L2-L3, lumbar spondylosis is unchanged from the prior exam. Findings are most notably as follows. 4. At L3-L4, there is multifactorial bilateral subarticular narrowing and moderate central canal stenosis. 5. At L4-L5, there is multifactorial severe bilateral subarticular and central canal stenosis. Moderate/severe right neural foraminal and also present at this level. 6. At L5-S1, there is multifactorial severe left subarticular stenosis. Moderate/severe bilateral neural foraminal narrowing also present at this level. 7. Bilateral nonobstructive nephrolithiasis. 8. Distended urinary bladder, incompletely imaged. 9.  Aortic Atherosclerosis (ICD10-I70.0).   Electronically Signed By: Jackey Loge D.O. On: 06/28/2022 18:24   DG Lumbar Spine 2-3 Views  Narrative CLINICAL DATA:  52 year old female with acute recurrence of back pain after tripping on her cat  EXAM: LUMBAR SPINE - 2-3 VIEW  COMPARISON:  Recent prior kyphoplasty 10/06/2022; CT scan of the lumbar spine 09/02/2022; MRI of the lumbar spine 08/22/2022  FINDINGS: Stable postoperative changes of L1 cement augmentation. Chronic compression fracture with Schmorl's node of the L2 vertebral body appears similar compared to prior. No definite acute fracture or malalignment. Vertebral body heights are otherwise maintained. Degenerative changes are again noted at L4-L5 and L5-S1. Bilateral lower lumbar facet arthropathy. Atherosclerotic calcifications present in the abdominal aorta. Tubal ligation clips noted on the frontal view.  IMPRESSION: 1. No convincing evidence of acute fracture by conventional x-ray. 2. Stable postoperative changes of L1 cement augmentation. 3. Stable chronic compression fracture with Schmorl's node deformity at L2.   Electronically Signed By: Malachy Moan M.D. On: 10/13/2022 14:30   Complexity Note: Imaging results reviewed.                         ROS  Cardiovascular: Daily Aspirin  intake, High blood pressure, Heart attack ( Date: 2020), Heart surgery, and Blood thinners:  Antiplatelet Pulmonary or Respiratory: Snoring  Neurological: No reported neurological signs or symptoms such as seizures, abnormal skin sensations, urinary and/or fecal incontinence, being born with an abnormal open spine and/or a tethered spinal cord Psychological-Psychiatric: No reported psychological or psychiatric signs or symptoms such as difficulty sleeping, anxiety, depression, delusions or hallucinations (schizophrenial), mood swings (bipolar disorders) or suicidal ideations or attempts Gastrointestinal: Reflux or heatburn Genitourinary: No reported renal or genitourinary signs or symptoms such as difficulty voiding or producing urine, peeing blood, non-functioning kidney, kidney stones, difficulty emptying the bladder, difficulty controlling the flow of urine, or chronic kidney disease Hematological: Brusing easily Endocrine: High blood sugar controlled without the use of insulin (NIDDM) Rheumatologic: Joint aches and or swelling due to excess weight (Osteoarthritis) Musculoskeletal: Negative for myasthenia gravis, muscular dystrophy, multiple sclerosis or malignant hyperthermia Work History: Disabled  Allergies  Claudia Mills is allergic to bee venom.  Laboratory Chemistry Profile   Renal Lab Results  Component Value Date   BUN 15 10/21/2022   CREATININE 0.95 10/21/2022   BCR 10 09/20/2022   GFRAA >60 12/30/2015   GFRNONAA >60 10/21/2022   PROTEINUR 100 (A) 10/18/2022     Electrolytes Lab Results  Component Value Date   NA 124 (L) 10/21/2022   K 3.1 (L) 10/21/2022   CL 88 (L) 10/21/2022   CALCIUM 9.1 10/21/2022   MG 1.6 (L) 10/19/2022   PHOS 2.8 10/19/2022     Hepatic Lab Results  Component Value Date  AST 17 10/18/2022   ALT 12 10/18/2022   ALBUMIN 4.6 10/18/2022   ALKPHOS 126 10/18/2022   LIPASE 55 (H) 10/18/2022     ID Lab Results  Component Value Date   HIV Non  Reactive 10/18/2022   SARSCOV2NAA Not Detected 12/28/2018   PREGTESTUR NEGATIVE 12/30/2015     Bone No results found for: "VD25OH", "VD125OH2TOT", "BJ4782NF6", "OZ3086VH8", "25OHVITD1", "25OHVITD2", "25OHVITD3", "TESTOFREE", "TESTOSTERONE"   Endocrine Lab Results  Component Value Date   GLUCOSE 461 (H) 10/21/2022   GLUCOSEU >=500 (A) 10/18/2022   HGBA1C 8.7 (H) 10/18/2022     Neuropathy Lab Results  Component Value Date   HGBA1C 8.7 (H) 10/18/2022   HIV Non Reactive 10/18/2022     CNS No results found for: "COLORCSF", "APPEARCSF", "RBCCOUNTCSF", "WBCCSF", "POLYSCSF", "LYMPHSCSF", "EOSCSF", "PROTEINCSF", "GLUCCSF", "JCVIRUS", "CSFOLI", "IGGCSF", "LABACHR", "ACETBL"   Inflammation (CRP: Acute  ESR: Chronic) Lab Results  Component Value Date   LATICACIDVEN 1.8 10/18/2022     Rheumatology No results found for: "RF", "ANA", "LABURIC", "URICUR", "LYMEIGGIGMAB", "LYMEABIGMQN", "HLAB27"   Coagulation Lab Results  Component Value Date   INR 0.9 09/20/2022   LABPROT 10.0 09/20/2022   PLT 397 10/21/2022     Cardiovascular Lab Results  Component Value Date   CKTOTAL 26 (L) 09/20/2021   HGB 15.1 (H) 10/21/2022   HCT 42.0 10/21/2022     Screening Lab Results  Component Value Date   SARSCOV2NAA Not Detected 12/28/2018   HIV Non Reactive 10/18/2022   PREGTESTUR NEGATIVE 12/30/2015     Cancer No results found for: "CEA", "CA125", "LABCA2"   Allergens No results found for: "ALMOND", "APPLE", "ASPARAGUS", "AVOCADO", "BANANA", "BARLEY", "BASIL", "BAYLEAF", "GREENBEAN", "LIMABEAN", "WHITEBEAN", "BEEFIGE", "REDBEET", "BLUEBERRY", "BROCCOLI", "CABBAGE", "MELON", "CARROT", "CASEIN", "CASHEWNUT", "CAULIFLOWER", "CELERY"     Note: Lab results reviewed.  PFSH  Drug: Claudia Mills  reports current drug use. Drug: Marijuana. Alcohol:  reports that she does not currently use alcohol after a past usage of about 12.0 standard drinks of alcohol per week. Tobacco:  reports that she has  quit smoking. She has never used smokeless tobacco. Medical:  has a past medical history of Diabetes mellitus without complication (HCC), Hypertension, and MI (myocardial infarction) (HCC). Family: family history is not on file.  Past Surgical History:  Procedure Laterality Date   CORONARY ANGIOPLASTY WITH STENT PLACEMENT     IR KYPHO LUMBAR INC FX REDUCE BONE BX UNI/BIL CANNULATION INC/IMAGING  10/06/2022   IR RADIOLOGIST EVAL & MGMT  09/20/2022   IR RADIOLOGIST EVAL & MGMT  10/13/2022   IR RADIOLOGIST EVAL & MGMT  10/20/2022   Active Ambulatory Problems    Diagnosis Date Noted   Nausea and vomiting 10/18/2022   Essential hypertension 01/31/2019   NSTEMI (non-ST elevated myocardial infarction) (HCC) 01/31/2019   DKA, type 2, not at goal [facility]) 10/18/2022   Hyperglycemia 10/19/2022   Dehydration 10/19/2022   Lumbar facet arthropathy 12/27/2022   Compression fracture of L1 lumbar vertebra (HCC) 12/27/2022   Chronic bilateral low back pain without sciatica 12/27/2022   Resolved Ambulatory Problems    Diagnosis Date Noted   No Resolved Ambulatory Problems   Past Medical History:  Diagnosis Date   Diabetes mellitus without complication (HCC)    Hypertension    MI (myocardial infarction) (HCC)    Constitutional Exam  General appearance: Well nourished, well developed, and well hydrated. In no apparent acute distress Vitals:   12/27/22 0855  BP: 130/73  Pulse: (!) 124  Temp: (!) 97.4 F (36.3  C)  SpO2: 100%  Weight: 120 lb (54.4 kg)  Height: 5\' 6"  (1.676 m)   BMI Assessment: Estimated body mass index is 19.37 kg/m as calculated from the following:   Height as of this encounter: 5\' 6"  (1.676 m).   Weight as of this encounter: 120 lb (54.4 kg).  BMI interpretation table: BMI level Category Range association with higher incidence of chronic pain  <18 kg/m2 Underweight   18.5-24.9 kg/m2 Ideal body weight   25-29.9 kg/m2 Overweight Increased incidence by 20%  30-34.9 kg/m2 Obese  (Class I) Increased incidence by 68%  35-39.9 kg/m2 Severe obesity (Class II) Increased incidence by 136%  >40 kg/m2 Extreme obesity (Class III) Increased incidence by 254%   Patient's current BMI Ideal Body weight  Body mass index is 19.37 kg/m. Ideal body weight: 59.3 kg (130 lb 11.7 oz)   BMI Readings from Last 4 Encounters:  12/27/22 19.37 kg/m  11/29/22 21.26 kg/m  10/21/22 21.26 kg/m  10/18/22 21.24 kg/m   Wt Readings from Last 4 Encounters:  12/27/22 120 lb (54.4 kg)  11/29/22 120 lb (54.4 kg)  10/21/22 120 lb (54.4 kg)  10/18/22 119 lb 14.9 oz (54.4 kg)    Psych/Mental status: Alert, oriented x 3 (person, place, & time)       Eyes: PERLA Respiratory: No evidence of acute respiratory distress  Lumbar Spine Area Exam  Skin & Axial Inspection: No masses, redness, or swelling Alignment: Symmetrical Functional ROM: Pain restricted ROM affecting both sides Stability: No instability detected Muscle Tone/Strength: Functionally intact. No obvious neuro-muscular anomalies detected. Sensory (Neurological): Musculoskeletal pain pattern Palpation: No palpable anomalies       Provocative Tests: Hyperextension/rotation test: (+) bilaterally for facet joint pain. Lumbar quadrant test (Kemp's test): (+) bilaterally for facet joint pain.  Lower Extremity Exam    Side: Right lower extremity  Side: Left lower extremity  Stability: No instability observed          Stability: No instability observed          Skin & Extremity Inspection: Skin color, temperature, and hair growth are WNL. No peripheral edema or cyanosis. No masses, redness, swelling, asymmetry, or associated skin lesions. No contractures.  Skin & Extremity Inspection: Skin color, temperature, and hair growth are WNL. No peripheral edema or cyanosis. No masses, redness, swelling, asymmetry, or associated skin lesions. No contractures.  Functional ROM: Unrestricted ROM                  Functional ROM: Unrestricted ROM                   Muscle Tone/Strength: Functionally intact. No obvious neuro-muscular anomalies detected.  Muscle Tone/Strength: Functionally intact. No obvious neuro-muscular anomalies detected.  Sensory (Neurological): Unimpaired        Sensory (Neurological): Unimpaired        DTR: Patellar: deferred today Achilles: deferred today Plantar: deferred today  DTR: Patellar: deferred today Achilles: deferred today Plantar: deferred today  Palpation: No palpable anomalies  Palpation: No palpable anomalies    Assessment  Primary Diagnosis & Pertinent Problem List: The primary encounter diagnosis was Lumbar spondylosis. Diagnoses of Lumbar facet arthropathy, Compression fracture of L1 vertebra, sequela, and Chronic bilateral low back pain without sciatica were also pertinent to this visit.  Visit Diagnosis (New problems to examiner): 1. Lumbar spondylosis   2. Lumbar facet arthropathy   3. Compression fracture of L1 vertebra, sequela   4. Chronic bilateral low back pain without sciatica  Plan of Care (Initial workup plan)  Claudia Mills has a history of greater than 3 months of moderate to severe pain which is resulted in functional impairment.  The patient has tried various conservative therapeutic options such as NSAIDs, Tylenol, muscle relaxants, which was inadequately effective.  Patient's pain is predominantly axial with physical exam findings suggestive of facet arthropathy. Lumbar facet medial branch nerve blocks were discussed with the patient.  Risks and benefits were reviewed.  Patient would like to proceed with bilateral L3, L4, L5 medial branch nerve block.  1. Lumbar spondylosis - LUMBAR FACET(MEDIAL BRANCH NERVE BLOCK) MBNB; Future - Ambulatory referral to Physical Therapy  2. Lumbar facet arthropathy - LUMBAR FACET(MEDIAL BRANCH NERVE BLOCK) MBNB; Future - Ambulatory referral to Physical Therapy  3. Compression fracture of L1 vertebra, sequela  4. Chronic bilateral low back  pain without sciatica - LUMBAR FACET(MEDIAL BRANCH NERVE BLOCK) MBNB; Future - Ambulatory referral to Physical Therapy     Referral Orders         Ambulatory referral to Physical Therapy     Procedure Orders         LUMBAR FACET(MEDIAL BRANCH NERVE BLOCK) MBNB     Provider-requested follow-up: Return in about 15 days (around 01/11/2023) for B/L L3, 4, 5 MBNB #1, in clinic IV Versed.  Future Appointments  Date Time Provider Department Center  01/16/2023  1:00 PM Drake Leach, PA-C [facility] None    Duration of encounter: .  Total time on encounter, as per AMA guidelines included both the face-to-face and non-face-to-face time personally spent by the physician and/or other qualified health care professional(s) on the day of the encounter (includes time in activities that require the physician or other qualified health care professional and does not include time in activities normally performed by clinical staff). Physician's time may include the following activities when performed: Preparing to see the patient (e.g., pre-charting review of records, searching for previously ordered imaging, lab work, and nerve conduction tests) Review of prior analgesic pharmacotherapies. Reviewing PMP Interpreting ordered tests (e.g., lab work, imaging, nerve conduction tests) Performing post-procedure evaluations, including interpretation of diagnostic procedures Obtaining and/or reviewing separately obtained history Performing a medically appropriate examination and/or evaluation Counseling and educating the patient/family/caregiver Ordering medications, tests, or procedures Referring and communicating with other health care professionals (when not separately reported) Documenting clinical information in the electronic or other health record Independently interpreting results (not separately reported) and communicating results to the patient/ family/caregiver Care coordination (not separately  reported)  Note by: Edward Jolly, MD (TTS technology used. I apologize for any typographical errors that were not detected and corrected.) Date: 12/27/2022; Time: 9:28 AM

## 2022-12-27 NOTE — Progress Notes (Signed)
Safety precautions to be maintained throughout the outpatient stay will include: orient to surroundings, keep bed in low position, maintain call bell within reach at all times, provide assistance with transfer out of bed and ambulation.  

## 2023-01-10 ENCOUNTER — Telehealth: Payer: Self-pay | Admitting: Orthopedic Surgery

## 2023-01-10 NOTE — Telephone Encounter (Signed)
Patient's appointment has been rescheduled to 02/16/2023 due to not having her injections until 01/18/2023. The patient is requesting a new work note to keep her out of work until her next appointment with Kennyth Arnold.

## 2023-01-10 NOTE — Telephone Encounter (Signed)
Injection appt 12/4. We typically see pt's about 2 weeks after injections. Stacy's next available is 02/16/23.

## 2023-01-11 ENCOUNTER — Telehealth: Payer: Self-pay

## 2023-01-11 ENCOUNTER — Encounter: Payer: Self-pay | Admitting: Family Medicine

## 2023-01-11 NOTE — Telephone Encounter (Signed)
All questions answered

## 2023-01-11 NOTE — Telephone Encounter (Signed)
She has questions regarding stopping medicines for her upcoming procedure.

## 2023-01-11 NOTE — Telephone Encounter (Signed)
Patient called to see if she could stop Brillinta for upcoming procedure.  Informed patient that we had not heard back from Dr Darrold Junker yet and I would send him another request.  Patient states she is going to stop it anyway since her procedue is scheduled for 01-18-23.  I will send another request to Dr Darrold Junker.

## 2023-01-11 NOTE — Telephone Encounter (Signed)
Patient notified letter sent to My[facility].

## 2023-01-16 ENCOUNTER — Ambulatory Visit: Payer: Medicaid Other | Admitting: Orthopedic Surgery

## 2023-01-18 ENCOUNTER — Ambulatory Visit
Admission: RE | Admit: 2023-01-18 | Discharge: 2023-01-18 | Disposition: A | Discharge status: Home / Self Care | Payer: Medicaid Other | Source: Ambulatory Visit | Attending: Student in an Organized Health Care Education/Training Program | Admitting: Student in an Organized Health Care Education/Training Program

## 2023-01-18 ENCOUNTER — Ambulatory Visit
Payer: Medicaid Other | Attending: Student in an Organized Health Care Education/Training Program | Admitting: Student in an Organized Health Care Education/Training Program

## 2023-01-18 ENCOUNTER — Encounter: Payer: Self-pay | Admitting: Student in an Organized Health Care Education/Training Program

## 2023-01-18 DIAGNOSIS — G8929 Other chronic pain: Secondary | ICD-10-CM | POA: Diagnosis not present

## 2023-01-18 DIAGNOSIS — M47816 Spondylosis without myelopathy or radiculopathy, lumbar region: Secondary | ICD-10-CM | POA: Diagnosis not present

## 2023-01-18 DIAGNOSIS — M545 Low back pain, unspecified: Secondary | ICD-10-CM | POA: Insufficient documentation

## 2023-01-18 MED ORDER — DEXAMETHASONE SODIUM PHOSPHATE 10 MG/ML IJ SOLN
20.0000 mg | Freq: Once | INTRAMUSCULAR | Status: AC
  Administered 2023-01-18: 20 mg

## 2023-01-18 MED ORDER — MIDAZOLAM HCL 2 MG/2ML IJ SOLN
0.5000 mg | Freq: Once | INTRAMUSCULAR | Status: AC
  Administered 2023-01-18: 2 mg via INTRAVENOUS

## 2023-01-18 MED ORDER — LACTATED RINGERS IV SOLN: Freq: Once | INTRAVENOUS | Status: AC

## 2023-01-18 MED ORDER — MIDAZOLAM HCL 2 MG/2ML IJ SOLN
INTRAMUSCULAR | Status: AC
  Filled 2023-01-18: qty 2

## 2023-01-18 MED ORDER — ROPIVACAINE HCL 2 MG/ML IJ SOLN
INTRAMUSCULAR | Status: AC
  Filled 2023-01-18: qty 20

## 2023-01-18 MED ORDER — DEXAMETHASONE SODIUM PHOSPHATE 10 MG/ML IJ SOLN
INTRAMUSCULAR | Status: AC
  Filled 2023-01-18: qty 2

## 2023-01-18 MED ORDER — ROPIVACAINE HCL 2 MG/ML IJ SOLN
18.0000 mL | Freq: Once | INTRAMUSCULAR | Status: AC
  Administered 2023-01-18: 18 mL via PERINEURAL

## 2023-01-18 MED ORDER — LIDOCAINE HCL 2 % IJ SOLN
20.0000 mL | Freq: Once | INTRAMUSCULAR | Status: AC
  Administered 2023-01-18: 400 mg

## 2023-01-18 MED ORDER — LIDOCAINE HCL 2 % IJ SOLN
INTRAMUSCULAR | Status: AC
  Filled 2023-01-18: qty 20

## 2023-01-18 NOTE — Progress Notes (Signed)
PROVIDER NOTE: Interpretation of information contained herein should be left to medically-trained personnel. Specific patient instructions are provided elsewhere under "Patient Instructions" section of medical record. This document was created in part using STT-dictation technology, any transcriptional errors that may result from this process are unintentional.  Patient: Claudia Mills Type: Established DOB: [birth year] MRN: [id number] PCP: Emogene Morgan, MD  Service: Procedure DOS: 01/18/2023 Setting: Ambulatory Location: Ambulatory outpatient facility Delivery: Face-to-face Provider: Edward Jolly, MD Specialty: Interventional Pain Management Specialty designation: 09 Location: Outpatient facility Ref. Prov.: Edward Jolly, MD       Interventional Therapy   Type: Lumbar Facet, Medial Branch Block(s) (w/ fluoroscopic mapping)          Laterality: Bilateral  Level: L3, L4, and L5 Medial Branch Level(s). Injecting these levels blocks the L3-4 and L4-5 lumbar facet joints. Imaging: Fluoroscopic guidance         Anesthesia: Local anesthesia (1-2% Lidocaine Sedation: Minimal Sedation                       DOS: 01/18/2023 Performed by: Edward Jolly, MD  Primary Purpose: Diagnostic/Therapeutic Indications: Low back pain severe enough to impact quality of life or function. 1. Lumbar spondylosis   2. Lumbar facet arthropathy   3. Chronic bilateral low back pain without sciatica    NAS-11 Pain score:   Pre-procedure: 4 /10   Post-procedure: 1 /10     Position / Prep / Materials:  Position: Prone  Prep solution: ChloraPrep (2% chlorhexidine gluconate and 70% isopropyl alcohol) Area Prepped: Posterolateral Lumbosacral Spine (Wide prep: From the lower border of the scapula down to the end of the tailbone and from flank to flank.)  Materials:  Tray: Block Needle(s):  Type: Spinal  Gauge (G): 22  Length: 3.5-in Qty: 2      H&P (Pre-op Assessment):  Claudia Mills is a 52 y.o. (year old),  female patient, seen today for interventional treatment. She  has a past surgical history that includes Coronary angioplasty with stent; IR Radiologist Eval & Mgmt (09/20/2022); IR KYPHO LUMBAR INC FX REDUCE BONE BX UNI/BIL CANNULATION INC/IMAGING (10/06/2022); IR Radiologist Eval & Mgmt (10/13/2022); and IR Radiologist Eval & Mgmt (10/20/2022). Claudia Mills has a current medication list which includes the following prescription(s): alendronate, aspirin ec, brilinta, cyclobenzaprine, duloxetine, empagliflozin, gabapentin, glipizide, ibuprofen, lisinopril, methocarbamol, metoprolol succinate, nitroglycerin, ondansetron, oxycodone, and pantoprazole, and the following Facility-Administered Medications: lactated ringers. Her primarily concern today is the Back Pain (lower)  Initial Vital Signs:  Pulse/HCG Rate: (!) 106ECG Heart Rate: (!) 115 Temp: (!) 97 F (36.1 C) Resp: 18 BP: (!) 142/88 SpO2: 100 %  BMI: Estimated body mass index is 19.37 kg/m as calculated from the following:   Height as of this encounter: 5\' 6"  (1.676 m).   Weight as of this encounter: 120 lb (54.4 kg).  Risk Assessment: Allergies: Reviewed. She is allergic to bee venom.  Allergy Precautions: None required Coagulopathies: Reviewed. None identified.  Blood-thinner therapy: None at this time Active Infection(s): Reviewed. None identified. Claudia Mills is afebrile  Site Confirmation: Claudia Mills was asked to confirm the procedure and laterality before marking the site Procedure checklist: Completed Consent: Before the procedure and under the influence of no sedative(s), amnesic(s), or anxiolytics, the patient was informed of the treatment options, risks and possible complications. To fulfill our ethical and legal obligations, as recommended by the American Medical Association's Code of Ethics, I have informed the patient of my clinical impression; the nature and  purpose of the treatment or procedure; the risks, benefits, and possible  complications of the intervention; the alternatives, including doing nothing; the risk(s) and benefit(s) of the alternative treatment(s) or procedure(s); and the risk(s) and benefit(s) of doing nothing. The patient was provided information about the general risks and possible complications associated with the procedure. These may include, but are not limited to: failure to achieve desired goals, infection, bleeding, organ or nerve damage, allergic reactions, paralysis, and death. In addition, the patient was informed of those risks and complications associated to Spine-related procedures, such as failure to decrease pain; infection (i.e.: Meningitis, epidural or intraspinal abscess); bleeding (i.e.: epidural hematoma, subarachnoid hemorrhage, or any other type of intraspinal or peri-dural bleeding); organ or nerve damage (i.e.: Any type of peripheral nerve, nerve root, or spinal cord injury) with subsequent damage to sensory, motor, and/or autonomic systems, resulting in permanent pain, numbness, and/or weakness of one or several areas of the body; allergic reactions; (i.e.: anaphylactic reaction); and/or death. Furthermore, the patient was informed of those risks and complications associated with the medications. These include, but are not limited to: allergic reactions (i.e.: anaphylactic or anaphylactoid reaction(s)); adrenal axis suppression; blood sugar elevation that in diabetics may result in ketoacidosis or comma; water retention that in patients with history of congestive heart failure may result in shortness of breath, pulmonary edema, and decompensation with resultant heart failure; weight gain; swelling or edema; medication-induced neural toxicity; particulate matter embolism and blood vessel occlusion with resultant organ, and/or nervous system infarction; and/or aseptic necrosis of one or more joints. Finally, the patient was informed that Medicine is not an exact science; therefore, there is also  the possibility of unforeseen or unpredictable risks and/or possible complications that may result in a catastrophic outcome. The patient indicated having understood very clearly. We have given the patient no guarantees and we have made no promises. Enough time was given to the patient to ask questions, all of which were answered to the patient's satisfaction. Ms. Waldrum has indicated that she wanted to continue with the procedure. Attestation: I, the ordering provider, attest that I have discussed with the patient the benefits, risks, side-effects, alternatives, likelihood of achieving goals, and potential problems during recovery for the procedure that I have provided informed consent. Date  Time: 01/18/2023 10:50 AM   Pre-Procedure Preparation:  Monitoring: As per clinic protocol. Respiration, ETCO2, SpO2, BP, heart rate and rhythm monitor placed and checked for adequate function Safety Precautions: Patient was assessed for positional comfort and pressure points before starting the procedure. Time-out: I initiated and conducted the "Time-out" before starting the procedure, as per protocol. The patient was asked to participate by confirming the accuracy of the "Time Out" information. Verification of the correct person, site, and procedure were performed and confirmed by me, the nursing staff, and the patient. "Time-out" conducted as per Joint Commission's Universal Protocol (UP.01.01.01). Time: 1124 Start Time: 1124 hrs.  Description of Procedure:          Laterality: (see above) Targeted Levels: (see above)  Safety Precautions: Aspiration looking for blood return was conducted prior to all injections. At no point did we inject any substances, as a needle was being advanced. Before injecting, the patient was told to immediately notify me if she was experiencing any new onset of "ringing in the ears, or metallic taste in the mouth". No attempts were made at seeking any paresthesias. Safe injection  practices and needle disposal techniques used. Medications properly checked for expiration dates. SDV (single dose  vial) medications used. After the completion of the procedure, all disposable equipment used was discarded in the proper designated medical waste containers. Local Anesthesia: Protocol guidelines were followed. The patient was positioned over the fluoroscopy table. The area was prepped in the usual manner. The time-out was completed. The target area was identified using fluoroscopy. A 12-in long, straight, sterile hemostat was used with fluoroscopic guidance to locate the targets for each level blocked. Once located, the skin was marked with an approved surgical skin marker. Once all sites were marked, the skin (epidermis, dermis, and hypodermis), as well as deeper tissues (fat, connective tissue and muscle) were infiltrated with a small amount of a short-acting local anesthetic, loaded on a 10cc syringe with a 25G, 1.5-in  Needle. An appropriate amount of time was allowed for local anesthetics to take effect before proceeding to the next step. Local Anesthetic: Lidocaine 2.0% The unused portion of the local anesthetic was discarded in the proper designated containers. Technical description of process:   L3 Medial Branch Nerve Block (MBB): The target area for the L3 medial branch is at the junction of the postero-lateral aspect of the superior articular process and the superior, posterior, and medial edge of the transverse process of L4. Under fluoroscopic guidance, a Quincke needle was inserted until contact was made with os over the superior postero-lateral aspect of the pedicular shadow (target area). After negative aspiration for blood, 2 mL of the nerve block solution was injected without difficulty or complication. The needle was removed intact. L4 Medial Branch Nerve Block (MBB): The target area for the L4 medial branch is at the junction of the postero-lateral aspect of the superior  articular process and the superior, posterior, and medial edge of the transverse process of L5. Under fluoroscopic guidance, a Quincke needle was inserted until contact was made with os over the superior postero-lateral aspect of the pedicular shadow (target area). After negative aspiration for blood, 2mL of the nerve block solution was injected without difficulty or complication. The needle was removed intact. L5 Medial Branch Nerve Block (MBB): The target area for the L5 medial branch is at the junction of the postero-lateral aspect of the superior articular process and the superior, posterior, and medial edge of the sacral ala. Under fluoroscopic guidance, a Quincke needle was inserted until contact was made with os over the superior postero-lateral aspect of the pedicular shadow (target area). After negative aspiration for blood, 2mL of the nerve block solution was injected without difficulty or complication. The needle was removed intact.   Once the entire procedure was completed, the treated area was cleaned, making sure to leave some of the prepping solution back to take advantage of its long term bactericidal properties.         Illustration of the posterior view of the lumbar spine and the posterior neural structures. Laminae of L2 through S1 are labeled. DPRL5, dorsal primary ramus of L5; DPRS1, dorsal primary ramus of S1; DPR3, dorsal primary ramus of L3; FJ, facet (zygapophyseal) joint L3-L4; I, inferior articular process of L4; LB1, lateral branch of dorsal primary ramus of L1; IAB, inferior articular branches from L3 medial branch (supplies L4-L5 facet joint); IBP, intermediate branch plexus; MB3, medial branch of dorsal primary ramus of L3; NR3, third lumbar nerve root; S, superior articular process of L5; SAB, superior articular branches from L4 (supplies L4-5 facet joint also); TP3, transverse process of L3.   Facet Joint Innervation (* possible contribution)  L1-2 T12, L1 (L2*)   Medial  Branch  L2-3 L1, L2 (L3*)         "          "  L3-4 L2, L3 (L4*)         "          "  L4-5 L3, L4 (L5*)         "          "  L5-S1 L4, L5, S1          "          "    Vitals:   01/18/23 1123 01/18/23 1128 01/18/23 1131 01/18/23 1137  BP: (!) 157/89 (!) 161/85 (!) 163/87 (!) 150/87  Pulse:      Resp: 16 17 18 16   Temp:      SpO2: 100% 100% 100% 100%  Weight:      Height:         End Time: 1131 hrs.  Imaging Guidance (Spinal):          Type of Imaging Technique: Fluoroscopy Guidance (Spinal) Indication(s): Fluoroscopy guidance for needle placement to enhance accuracy in procedures requiring precise needle localization for targeted delivery of medication in or near specific anatomical locations not easily accessible without such real-time imaging assistance. Exposure Time: Please see nurses notes. Contrast: None used. Fluoroscopic Guidance: I was personally present during the use of fluoroscopy. "Tunnel Vision Technique" used to obtain the best possible view of the target area. Parallax error corrected before commencing the procedure. "Direction-depth-direction" technique used to introduce the needle under continuous pulsed fluoroscopy. Once target was reached, antero-posterior, oblique, and lateral fluoroscopic projection used confirm needle placement in all planes. Images permanently stored in EMR. Interpretation: No contrast injected. I personally interpreted the imaging intraoperatively. Adequate needle placement confirmed in multiple planes. Permanent images saved into the patient's record.  Post-operative Assessment:  Post-procedure Vital Signs:  Pulse/HCG Rate: (!) 106(!) 109 Temp: (!) 97 F (36.1 C) Resp: 16 BP: (!) 150/87 SpO2: 100 %  EBL: None  Complications: No immediate post-treatment complications observed by team, or reported by patient.  Note: The patient tolerated the entire procedure well. A repeat set of vitals were taken after the procedure and the  patient was kept under observation following institutional policy, for this type of procedure. Post-procedural neurological assessment was performed, showing return to baseline, prior to discharge. The patient was provided with post-procedure discharge instructions, including a section on how to identify potential problems. Should any problems arise concerning this procedure, the patient was given instructions to immediately contact us, at any time, without hesitation. In any case, we plan to contact the patient by telephone for a follow-up status report regarding this interventional procedure.  Comments:  No additional relevant information.  Plan of Care (POC)  Orders:  Orders Placed This Encounter  Procedures   DG PAIN [facility] C-ARM 1-60 MIN NO REPORT    Intraoperative interpretation by procedural physician at [facility].    Standing Status:   Standing    Number of Occurrences:   1    Order Specific Question:   Reason for exam:    Answer:   Assistance in needle guidance and placement for procedures requiring needle placement in or near specific anatomical locations not easily accessible without such assistance.    Medications ordered for procedure: Meds ordered this encounter  Medications   lidocaine (XYLOCAINE) 2 % (with pres) injection 400 mg   lactated ringers infusion   midazolam (VERSED) injection  0.5-2 mg    Make sure Flumazenil is available in the pyxis when using this medication. If oversedation occurs, administer 0.2 mg IV over 15 sec. If after 45 sec no response, administer 0.2 mg again over 1 min; may repeat at 1 min intervals; not to exceed 4 doses (1 mg)   ropivacaine (PF) 2 mg/mL (0.2%) (NAROPIN) injection 18 mL   dexamethasone (DECADRON) injection 20 mg   Medications administered: We administered lidocaine, lactated ringers, midazolam, ropivacaine (PF) 2 mg/mL (0.2%), and dexamethasone.  See the medical record for exact dosing, route, and time of  administration.  Follow-up plan:   Return in about 5 weeks (around 02/22/2023) for PPE, F2F.       BLF L3-5 01/18/23    Recent Visits Date Type Provider Dept  12/27/22 Office Visit Edward Jolly, MD [facility]  Showing recent visits within past 90 days and meeting all other requirements Today's Visits Date Type Provider Dept  01/18/23 Procedure visit Edward Jolly, MD [facility]  Showing today's visits and meeting all other requirements Future Appointments Date Type Provider Dept  02/22/23 Appointment Edward Jolly, MD [facility]  Showing future appointments within next 90 days and meeting all other requirements  Disposition: Discharge home  Discharge (Date  Time): 01/18/2023; 1140 hrs.   Primary Care Physician: Emogene Morgan, MD Location: [facility] Note by: Edward Jolly, MD (TTS technology used. I apologize for any typographical errors that were not detected and corrected.) Date: 01/18/2023; Time: 11:47 AM  Disclaimer:  Medicine is not an Visual merchandiser. The only guarantee in medicine is that nothing is guaranteed. It is important to note that the decision to proceed with this intervention was based on the information collected from the patient. The Data and conclusions were drawn from the patient's questionnaire, the interview, and the physical examination. Because the information was provided in large part by the patient, it cannot be guaranteed that it has not been purposely or unconsciously manipulated. Every effort has been made to obtain as much relevant data as possible for this evaluation. It is important to note that the conclusions that lead to this procedure are derived in large part from the available data. Always take into account that the treatment will also be dependent on availability of resources and existing treatment guidelines, considered by other Pain Management Practitioners as being common knowledge and  practice, at the time of the intervention. For Medico-Legal purposes, it is also important to point out that variation in procedural techniques and pharmacological choices are the acceptable norm. The indications, contraindications, technique, and results of the above procedure should only be interpreted and judged by a Board-Certified Interventional Pain Specialist with extensive familiarity and expertise in the same exact procedure and technique.

## 2023-01-18 NOTE — Progress Notes (Signed)
Safety precautions to be maintained throughout the outpatient stay will include: orient to surroundings, keep bed in low position, maintain call bell within reach at all times, provide assistance with transfer out of bed and ambulation.  

## 2023-01-18 NOTE — Patient Instructions (Signed)

## 2023-01-19 ENCOUNTER — Telehealth: Payer: Self-pay | Admitting: *Deleted

## 2023-01-19 NOTE — Telephone Encounter (Signed)
Attempted to call for post procedure follow-up. Message left. 

## 2023-01-24 ENCOUNTER — Other Ambulatory Visit: Payer: Self-pay | Admitting: Orthopedic Surgery

## 2023-01-24 ENCOUNTER — Other Ambulatory Visit: Payer: Self-pay | Admitting: Physician Assistant

## 2023-01-24 DIAGNOSIS — M47816 Spondylosis without myelopathy or radiculopathy, lumbar region: Secondary | ICD-10-CM

## 2023-01-24 DIAGNOSIS — S32010S Wedge compression fracture of first lumbar vertebra, sequela: Secondary | ICD-10-CM

## 2023-02-13 NOTE — Progress Notes (Signed)
 Referring Physician:  Lorel Maxie LABOR, MD [name] [place],  [place] [address]  Primary Physician:  Lorel Maxie LABOR, MD  History of Present Illness: [name] has a history of HTN, CAD, and DM. Recent diagnosis of vertigo.   Claudia Mills had L1 kyphoplasty on 10/06/22. Claudia Mills was doing well after her procedure with no pain until a week later when Claudia Mills tripped over her cat on 10/11/22.   Last seen by me on 11/29/22 for constant LBP with no leg pain.    Xrays from 10/13/22 (after procedure) show no new compression fractures. Previous MRI showed lumbar spondylosis with DDD, moderate central stenosis L4-L5 with left lateral recess stenosis and mild/moderate foraminal stenosis on left at L5-S1.    LBP is likely due to underlying spondylosis.   Claudia Mills declined PT. Claudia Mills was given flexeril  and referred to pain management. Claudia Mills was given note to keep her out of work until her follow up.   Dr. Marcelino sent her to PT and Claudia Mills had bilateral L3, L4, L5 MBB on 01/18/23.   Claudia Mills had good relief of her pain (more than 50% improvement) for a few days and then pain returned. Now Claudia Mills is back to her constant LBP with no leg pain. Claudia Mills has good days and bad days. No numbness, tingling, or weakness in her legs. Pain is worse with bending, lifting, prolonged walking.   Claudia Mills is taking motrin and neurontin . Claudia Mills has not been taking flexeril .   Bowel/Bladder Dysfunction: none  Conservative measures:  Physical therapy: nothing recent Multimodal medical therapy including regular antiinflammatories: percocet   Injections:  bilateral L3, L4, L5 MBB on 01/18/23  Past Surgery: L1 kyphoplasty on 10/06/22  Claudia Mills has no symptoms of cervical myelopathy.  The symptoms are causing a significant impact on the patient's life.   Review of Systems:  A 10 point review of systems is negative, except for the pertinent positives and negatives detailed in the HPI.  Past Medical History: Past Medical History:  Diagnosis  Date   Diabetes mellitus without complication (HCC)    Hypertension    MI (myocardial infarction) (HCC)     Past Surgical History: Past Surgical History:  Procedure Laterality Date   CORONARY ANGIOPLASTY WITH STENT PLACEMENT     IR KYPHO LUMBAR INC FX REDUCE BONE BX UNI/BIL CANNULATION INC/IMAGING  10/06/2022   IR RADIOLOGIST EVAL & MGMT  09/20/2022   IR RADIOLOGIST EVAL & MGMT  10/13/2022   IR RADIOLOGIST EVAL & MGMT  10/20/2022    Allergies: Allergies as of 02/16/2023 - Review Complete 02/16/2023  Allergen Reaction Noted   Bee venom Anaphylaxis 04/07/2021    Medications: Outpatient Encounter Medications as of 02/16/2023  Medication Sig   alendronate (FOSAMAX) 70 MG tablet Take 70 mg by mouth once a week.   aspirin  EC 81 MG tablet Take 81 mg by mouth daily. Swallow whole.   BRILINTA  90 MG TABS tablet Take 90 mg by mouth 2 (two) times daily.   DULoxetine  (CYMBALTA ) 30 MG capsule Take 30 mg by mouth daily.   Empagliflozin (JARDIANCE PO) Take 25 mg by mouth daily.   gabapentin  (NEURONTIN ) 300 MG capsule Take 300 mg by mouth 3 (three) times daily.   glipiZIDE  (GLUCOTROL ) 5 MG tablet Take 1 tablet (5 mg total) by mouth daily before breakfast.   ibuprofen (ADVIL) 800 MG tablet Take 800 mg by mouth 3 (three) times daily.   lisinopril  (ZESTRIL ) 20 MG tablet Take 1 tablet (20 mg total) by  mouth daily.   metoprolol  succinate (TOPROL  XL) 100 MG 24 hr tablet Take 1 tablet (100 mg total) by mouth daily. Take with or immediately following a meal.   nitroGLYCERIN  (NITROSTAT ) 0.3 MG SL tablet Place 0.3 mg under the tongue every 5 (five) minutes as needed for chest pain.   ondansetron  (ZOFRAN -ODT) 4 MG disintegrating tablet Take 1 tablet (4 mg total) by mouth every 8 (eight) hours as needed for nausea or vomiting.   pantoprazole  (PROTONIX ) 40 MG tablet Take 1 tablet (40 mg total) by mouth daily.   [DISCONTINUED] cyclobenzaprine  (FLEXERIL ) 10 MG tablet Take 1 tablet (10 mg total) by mouth 3 (three)  times daily as needed for muscle spasms. This can make you sleepy. (Patient not taking: Reported on 02/16/2023)   [DISCONTINUED] methocarbamol  (ROBAXIN -750) 750 MG tablet Take 1 tablet (750 mg total) by mouth 3 (three) times daily. (Patient not taking: Reported on 02/16/2023)   [DISCONTINUED] oxyCODONE  (ROXICODONE ) 5 MG immediate release tablet Take 1 tablet (5 mg total) by mouth every 12 (twelve) hours as needed. (Patient not taking: Reported on 02/16/2023)   No facility-administered encounter medications on file as of 02/16/2023.    Social History: Social History   Tobacco Use   Smoking status: Former   Smokeless tobacco: Never  [employer]   Vaping status: Never Used  Substance Use Topics   Alcohol use: Not Currently    Alcohol/week: 12.0 standard drinks of alcohol    Types: 12 Cans of beer per week   Drug use: Yes    Types: Marijuana    Family Medical History: History reviewed. No pertinent family history.  Physical Examination: Vitals:   02/16/23 0947  BP: (!) 140/78      Awake, alert, oriented to person, place, and time.  Speech is clear and fluent. Fund of knowledge is appropriate.   Cranial Nerves: Pupils equal round and reactive to light.  Facial tone is symmetric.    Claudia Mills has diffuse mid to lower lumbar tenderness.   No abnormal lesions on exposed skin.   Strength: Side Iliopsoas Quads Hamstring PF DF EHL  R 5 5 5 5 5 5   L 5 5 5 5 5 5    Clonus is not present.   Bilateral lower extremity sensation is intact to light touch, slightly diminished medial ankle on right.   Gait is normal.     Medical Decision Making  Imaging: none  Assessment and Plan: Claudia Mills is a pleasant 52 y.o. female who had L1 kyphoplasty on 10/06/22. Claudia Mills was doing well after her procedure with no pain until a week later when Claudia Mills tripped over her cat on 10/11/22.   Claudia Mills had good relief of her pain (more than 50% improvement) for a few days after MBB and then pain returned. Now Claudia Mills is back to  her constant LBP with no leg pain. Claudia Mills has good days and bad days. No numbness, tingling, or weakness in her legs.   Xrays from 10/13/22 (after procedure) show no new compression fractures. Previous MRI showed lumbar spondylosis with DDD, moderate central stenosis L4-L5 with left lateral recess stenosis and mild/moderate foraminal stenosis on left at L5-S1.   LBP is likely due to underlying spondylosis.   Treatment options discussed with patient and following plan made:   - Follow up with Dr. Marcelino as scheduled to discuss further MBB/RFA.  - PT for lumbar spine. Orders to [facility].  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 Note keeping her out  until follow up with me. Will also order FCE to determine more permanent restrictions.  - I will see her back in 6-8 weeks and prn.   BP was slightly elevated. Claudia Mills just took her BP meds. No symptoms of chest pain, shortness of breath, blurry vision, or headaches. Claudia Mills checks BP at home and it generally runs lower. If Claudia Mills develops CP, SOB, blurry vision, or headaches, then Claudia Mills will go to ED.     I spent a total of 15 minutes in face-to-face and non-face-to-face activities related to this patient's care today including review of outside records, review of imaging, review of symptoms, physical exam, discussion of differential diagnosis, discussion of treatment options, and documentation.   Glade Boys PA-C Dept. of Neurosurgery

## 2023-02-14 DIAGNOSIS — F330 Major depressive disorder, recurrent, mild: Secondary | ICD-10-CM | POA: Diagnosis not present

## 2023-02-14 DIAGNOSIS — I10 Essential (primary) hypertension: Secondary | ICD-10-CM | POA: Diagnosis not present

## 2023-02-14 DIAGNOSIS — M25512 Pain in left shoulder: Secondary | ICD-10-CM | POA: Diagnosis not present

## 2023-02-14 DIAGNOSIS — E1165 Type 2 diabetes mellitus with hyperglycemia: Secondary | ICD-10-CM | POA: Diagnosis not present

## 2023-02-14 DIAGNOSIS — Z712 Person consulting for explanation of examination or test findings: Secondary | ICD-10-CM | POA: Diagnosis not present

## 2023-02-14 DIAGNOSIS — E119 Type 2 diabetes mellitus without complications: Secondary | ICD-10-CM | POA: Diagnosis not present

## 2023-02-14 DIAGNOSIS — Z1389 Encounter for screening for other disorder: Secondary | ICD-10-CM | POA: Diagnosis not present

## 2023-02-14 DIAGNOSIS — Z0130 Encounter for examination of blood pressure without abnormal findings: Secondary | ICD-10-CM | POA: Diagnosis not present

## 2023-02-16 ENCOUNTER — Ambulatory Visit: Payer: Medicaid Other | Admitting: Orthopedic Surgery

## 2023-02-16 ENCOUNTER — Encounter: Payer: Self-pay | Admitting: Orthopedic Surgery

## 2023-02-16 VITALS — BP 140/78 | Ht 66.0 in | Wt 129.8 lb

## 2023-02-16 DIAGNOSIS — S32010D Wedge compression fracture of first lumbar vertebra, subsequent encounter for fracture with routine healing: Secondary | ICD-10-CM

## 2023-02-16 DIAGNOSIS — M48061 Spinal stenosis, lumbar region without neurogenic claudication: Secondary | ICD-10-CM | POA: Diagnosis not present

## 2023-02-16 DIAGNOSIS — M47816 Spondylosis without myelopathy or radiculopathy, lumbar region: Secondary | ICD-10-CM

## 2023-02-16 DIAGNOSIS — M5136 Other intervertebral disc degeneration, lumbar region with discogenic back pain only: Secondary | ICD-10-CM | POA: Diagnosis not present

## 2023-02-16 DIAGNOSIS — S32010S Wedge compression fracture of first lumbar vertebra, sequela: Secondary | ICD-10-CM

## 2023-02-16 NOTE — Patient Instructions (Signed)
 It was so nice to see you today. Thank you so much for coming in.    You have some wear and tear in your back (arthritis) and this may be causing your pain.   Follow up with Dr. Marcelino as scheduled.   I sent PT orders to [facility]- they should call you to schedule. I also sent orders to PT for an FCE (functional capacity evaluation)- this will help us  find out what you can and cannot do at work.   I gave you a work note.   I will see you back in 6-8 weeks.   Please do not hesitate to call if you have any questions or concerns. You can also message me in MyChart.   Glade Boys PA-C [phone]      The physicians and staff at [facility] are committed to providing excellent care. You may receive a survey requesting feedback about your experience at our office. We strive to receive very good responses to the survey questions. If you feel that your experience would prevent you from giving the office a very good  response, please contact our office to try to remedy the situation. We may be reached at [phone]. Thank you for taking the time out of your busy day to complete the survey.

## 2023-02-22 ENCOUNTER — Ambulatory Visit: Payer: Medicaid Other | Admitting: Student in an Organized Health Care Education/Training Program

## 2023-02-24 DIAGNOSIS — M25512 Pain in left shoulder: Secondary | ICD-10-CM | POA: Diagnosis not present

## 2023-02-24 DIAGNOSIS — M249 Joint derangement, unspecified: Secondary | ICD-10-CM | POA: Insufficient documentation

## 2023-02-24 DIAGNOSIS — M24812 Other specific joint derangements of left shoulder, not elsewhere classified: Secondary | ICD-10-CM | POA: Diagnosis not present

## 2023-03-07 ENCOUNTER — Ambulatory Visit: Payer: Medicaid Other | Admitting: Student in an Organized Health Care Education/Training Program

## 2023-03-15 DIAGNOSIS — F330 Major depressive disorder, recurrent, mild: Secondary | ICD-10-CM | POA: Diagnosis not present

## 2023-03-22 DIAGNOSIS — E119 Type 2 diabetes mellitus without complications: Secondary | ICD-10-CM | POA: Diagnosis not present

## 2023-03-22 DIAGNOSIS — Z23 Encounter for immunization: Secondary | ICD-10-CM | POA: Diagnosis not present

## 2023-03-22 DIAGNOSIS — I214 Non-ST elevation (NSTEMI) myocardial infarction: Secondary | ICD-10-CM | POA: Diagnosis not present

## 2023-03-22 DIAGNOSIS — I10 Essential (primary) hypertension: Secondary | ICD-10-CM | POA: Diagnosis not present

## 2023-03-22 DIAGNOSIS — E7800 Pure hypercholesterolemia, unspecified: Secondary | ICD-10-CM | POA: Diagnosis not present

## 2023-03-22 NOTE — Progress Notes (Signed)
 New Patient Visit    Chief Complaint: Chief Complaint  Patient presents with  . Myocardial Infarction    Hx of, 4 mo check   Date of Service: 03/22/2023 Date of Birth: 13-Aug-1970 PCP: Lorel Byars Achirimofor, MD  History of Present Illness: Claudia Mills is a 53 y.o.female patient who is referred for   1.  NSTEMI 01/30/2019  2.  2.5 x 38 mm Xience DES mid LAD (SCAD), 02/01/2019 at [facility]  3.  Essential hypertension  4.  Hyperlipidemia  5.  Type 2 diabetes  6.  Diabetic peripheral neuropathy  Patient has history of NSTEMI 01/30/2019 at [facility].  2D echocardiogram 01/30/2019 revealed normal left ventricular function, with LVEF greater than 55%.  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  The patient underwent PCI with 2.5 x 38 mm Xience DES mid LAD with PTCA of diagonal branch.  Patient currently on low-dose aspirin  and Brilinta .  The patient returns today for follow-up, reports doing well.  She denies exertional chest pain or shortness of breath.  She denies palpitations or heart racing.  She denies peripheral edema.  The patient is not very active and does not exercise, limited by back pain due to L1 compression fracture and diabetic neuropathy.  The patient has essential hypertension, blood pressure well-controlled on metoprolol  succinate and lisinopril , which are well-tolerated without apparent side effects.  The patient follows a low-sodium, no added salt diet.  The patient has hyperlipidemia, on high intensity atorvastatin , which is well-tolerated without apparent side effects, followed by her primary care provider.  The patient follows a low-cholesterol, low-fat diet.  The patient has type 2 diabetes, on empagliflozin, which is well-tolerated without apparent side effects, followed by her primary care provider.  The patient follows a low carbohydrate, ADA  diet.  Past Medical and Surgical History  Past Medical History Past Medical History:  Diagnosis Date  . Diabetes mellitus without complication (CMS/HHS-HCC)   . History of myocardial infarction   . Hyperlipidemia   . Hypertension     Past Surgical History She has a past surgical history that includes CORONARY ANGIOPLASTY WITH STENT PLACEMENT.   Medications and Allergies  Current Medications Current Outpatient Medications  Medication Sig Dispense Refill  . aspirin  81 MG EC tablet     . DULoxetine  (CYMBALTA ) 30 MG DR capsule Take 30 mg by mouth once daily    . empagliflozin (JARDIANCE) 10 mg tablet Take 10 mg by mouth once daily    . gabapentin  (NEURONTIN ) 300 MG capsule Take 300 mg by mouth at bedtime    . glipiZIDE  (GLUCOTROL ) 5 MG tablet Take 5 mg by mouth    . lisinopriL  (ZESTRIL ) 5 MG tablet Take 5 mg by mouth once daily    . nitroGLYcerin  (NITROSTAT ) 0.4 MG SL tablet Place 1 tablet under the tongue every 5 (five) minutes as needed for Chest pain    . pantoprazole  (PROTONIX ) 40 MG DR tablet Take 40 mg by mouth once daily    . ticagrelor  (BRILINTA ) 90 mg tablet Take 90 mg by mouth 2 (two) times daily    . atorvastatin  (LIPITOR) 80 MG tablet Take 1 tablet by mouth once daily (Patient not taking: Reported on 09/29/2022)    . cyclobenzaprine  (FLEXERIL ) 5 MG tablet Take 5 mg by mouth 3 (three) times daily as needed (Patient not taking: Reported on 03/22/2023)    . HYDROcodone -acetaminophen  (NORCO) 5-325 mg  tablet Take 1 tablet by mouth every 6 (six) hours as needed (Patient not taking: Reported on 03/22/2023)    . meloxicam  (MOBIC ) 15 MG tablet Take 1 tablet by mouth once daily (Patient not taking: Reported on 03/22/2023)    . metFORMIN  (GLUCOPHAGE ) 500 MG tablet Take 1 tablet by mouth 2 (two) times daily with meals (Patient not taking: Reported on 03/16/2022)    . metoprolol  succinate (TOPROL -XL) 100 MG XL tablet Take 1 tablet by mouth once daily     No current facility-administered  medications for this visit.    Allergies Venom-honey bee and Yellow jacket venom  Social and Family History  Social History  reports that she has never smoked. She has never been exposed to tobacco smoke. She has never used smokeless tobacco.  Family History family history is not on file.   Review of Systems   Review of Systems: The patient denies chest pain, shortness of breath, orthopnea, paroxysmal nocturnal dyspnea, pedal edema, palpitations, heart racing, presyncope, syncope. Review of 12 Systems is negative except as described above.  Physical Examination   Vitals:Pulse 98   Ht 167.6 cm (5' 6)   Wt 59.9 kg (132 lb)   SpO2 99%   BMI 21.31 kg/m  Ht:167.6 cm (5' 6) Wt:59.9 kg (132 lb) ADJ:Anib surface area is 1.67 meters squared. Body mass index is 21.31 kg/m.  HEENT: Pupils equally reactive to light and accomodation  Neck: Supple without thyromegaly, carotid pulses 2+ Lungs: clear to auscultation bilaterally; no wheezes, rales, rhonchi Heart: Regular rate and rhythm.  No gallops, murmurs or rub Abdomen: soft nontender, nondistended, with normal bowel sounds Extremities: no cyanosis, clubbing, or edema Peripheral Pulses: 2+ in all extremities, 2+ femoral pulses bilaterally  Assessment   53 y.o. female with  1. Essential hypertension   2. Need for vaccination   3. NSTEMI (non-ST elevated myocardial infarction) (CMS/HHS-HCC)   4. Diabetes mellitus type 2, diet-controlled (CMS/HHS-HCC)   5. Pure hypercholesterolemia    53 year old female with NSTEMI, probable scad of mid LAD, status post PCI with 2.5 x 38 mm Xience DES mid LAD 02/01/2019 at [facility].  2D echocardiogram 01/30/2019 revealed LVEF greater than 55%.  Patient currently denies chest pain.  Patient currently on low-dose aspirin  and Brilinta .  The patient has essential hypertension, blood pressure well-controlled on current BP medications.     Plan   1.  Continue current medications 2.  Counseled  patient about low-sodium diet 3.  DASH diet printed instructions given to the patient 4.  Counseled patient about low-cholesterol diet 5.  Continue atorvastatin  for hyperlipidemia management 6.  Low-fat and cholesterol diet printed instructions given to the patient 7.  Return to clinic for follow-up in 6 months   No orders of the defined types were placed in this encounter.   Return in about 6 months (around 09/19/2023).  MARSA DOOMS, MD PhD [facility]

## 2023-03-28 ENCOUNTER — Encounter: Payer: Self-pay | Admitting: Physical Therapy

## 2023-03-28 ENCOUNTER — Ambulatory Visit: Payer: Medicaid Other | Attending: Orthopedic Surgery | Admitting: Physical Therapy

## 2023-03-28 DIAGNOSIS — M47816 Spondylosis without myelopathy or radiculopathy, lumbar region: Secondary | ICD-10-CM | POA: Diagnosis not present

## 2023-03-28 DIAGNOSIS — M6281 Muscle weakness (generalized): Secondary | ICD-10-CM | POA: Insufficient documentation

## 2023-03-28 DIAGNOSIS — S32010S Wedge compression fracture of first lumbar vertebra, sequela: Secondary | ICD-10-CM | POA: Insufficient documentation

## 2023-03-28 DIAGNOSIS — M2560 Stiffness of unspecified joint, not elsewhere classified: Secondary | ICD-10-CM | POA: Insufficient documentation

## 2023-03-28 DIAGNOSIS — M5459 Other low back pain: Secondary | ICD-10-CM | POA: Insufficient documentation

## 2023-03-28 NOTE — Therapy (Signed)
OUTPATIENT PHYSICAL THERAPY THORACOLUMBAR EVALUATION  Patient Name: Claudia Mills MRN: [id number] DOB:October 02, 1970, 53 y.o., female Today's Date: 03/28/2023  END OF SESSION:  PT End of Session - 03/28/23 1554     Visit Number 1    Number of Visits 16    Date for PT Re-Evaluation 05/23/23    Authorization Type --    Progress Note Due on Visit --    PT Start Time 1437    PT Stop Time 1540    PT Time Calculation (min) 63 min    Activity Tolerance Patient limited by pain    Behavior During Therapy [facility] for tasks assessed/performed             Past Medical History:  Diagnosis Date   Diabetes mellitus without complication (HCC)    Hypertension    MI (myocardial infarction) (HCC)    Past Surgical History:  Procedure Laterality Date   CORONARY ANGIOPLASTY WITH STENT PLACEMENT     IR KYPHO LUMBAR INC FX REDUCE BONE BX UNI/BIL CANNULATION INC/IMAGING  10/06/2022   IR RADIOLOGIST EVAL & MGMT  09/20/2022   IR RADIOLOGIST EVAL & MGMT  10/13/2022   IR RADIOLOGIST EVAL & MGMT  10/20/2022   Patient Active Problem List   Diagnosis Date Noted   Lumbar facet arthropathy 12/27/2022   Compression fracture of L1 lumbar vertebra (HCC) 12/27/2022   Chronic bilateral low back pain without sciatica 12/27/2022   Hyperglycemia 10/19/2022   Dehydration 10/19/2022   Nausea and vomiting 10/18/2022   DKA, type 2, not at goal [facility]) 10/18/2022   Essential hypertension 01/31/2019   NSTEMI (non-ST elevated myocardial infarction) (HCC) 01/31/2019    PCP: Emogene Morgan, MD  REFERRING PROVIDER: Drake Leach, PA-C  REFERRING DIAG:   Rationale for Evaluation and Treatment: Rehabilitation  THERAPY DIAG:  Other low back pain  Muscle weakness (generalized)  Joint stiffness of spine  ONSET DATE: 10/11/2022  SUBJECTIVE:                                                                                                                                                                                            SUBJECTIVE STATEMENT:  Pt. Is a 53 year old female with c/o chronic B LBP and RLE radicular symptoms. Pt. Emphasizes R LBP to be greater than left. Pt. Describes that they are struggling to perform ADLs/IADLs independently without taking seated rests to ease LBP when standing/bending in any direction.  Pt. Reports they want to be able to remain independent with ADLs/IADLs and return to work. Pt. Expresses that they are dealing with financial limitations related to getting put on disability or  finding a different job that does not provoke their LBP. Pt. Notes their current gabapentin dosage helps to decrease burning sensations in B LE at night. Pt. Describes that all standing activities and walking uphill significantly increases their LBP. Pt. Expresses that they had a history of falls over the past year related to uncontrolled DM. Pt. Confirms they have not had LOB issues since managing their DM.  PERTINENT HISTORY:  - DM without complications, currently controlled - H/o HTN, CAD, and DM  - L1 kyphoplasty on 10/06/22   PAIN:  Are you having pain? Yes: NPRS scale: 4 Pain location: Lumbar spine and RLE Pain description: Sharp/Shooting/Radicular Symptoms Aggravating factors: Walking uphill, standing greater then 5 minutes Relieving factors: Resting in seated position  PRECAUTIONS: None  RED FLAGS: Negative for bowel/bladder changes, saddle paresthesia, h/o spinal tumor, personal hx of cancer, and h/o abdominal aneurysm.   WEIGHT BEARING RESTRICTIONS: No  FALLS:  Has patient fallen in last 6 months? Yes. Number of falls 5, all related to previously uncontrolled DM  LIVING ENVIRONMENT: Lives with: lives with their family Lives in: House/apartment Stairs: Yes: External: 3-4 steps; can reach both and bilateral but cannot reach both Has following equipment at home: None  OCCUPATION: Unemployed working towards disability  PLOF: Needs assistance with ADLs  PATIENT GOALS:   - Be able  to stand for greater than 15 minutes without LBP/radicular symptoms in RLE - Correct forward head/shoulder posture - Increase B LE Strength - Be able to wash dishes at counter without breaks - Return to work/find a new job.   NEXT MD VISIT: PRN  OBJECTIVE:   Note: Objective measures were completed at Evaluation unless otherwise noted.  - Kyphotic posture - Increased step width - Decreased stride length - Forward shoulder positioning - UE Mod. Assist on legs for sit to stand and transitions   DIAGNOSTIC FINDINGS:   Xrays from 10/13/22 (after procedure) show no new compression fractures. Previous MRI showed lumbar spondylosis with DDD, moderate central stenosis L4-L5 with left lateral recess stenosis and mild/moderate foraminal stenosis on left at L5-S1.   PATIENT SURVEYS:  Modified Oswestry 52%   COGNITION: Overall cognitive status: Within functional limits for tasks assessed   SENSATION: Light touch: Impaired   MUSCLE LENGTH: Hamstrings: Not examined Thomas test: Not examined  POSTURE: decreased lumbar lordosis, increased thoracic kyphosis, forward head, rounded shoulders  PALPATION: - Point tenderness to B lumbar paraspinal muscle groups - Point tenderness to B SIJ/PSIS  LUMBAR ROM: Deferred do to pain  AROM eval  Flexion   Extension   Right lateral flexion   Left lateral flexion   Right rotation   Left rotation    (Blank rows = not tested)  LOWER EXTREMITY ROM:     Active  Right eval Left eval  Hip flexion 86 80  Hip extension 8 7  Hip abduction 19 24  Hip adduction 18 16  Hip internal rotation    Hip external rotation    Knee flexion WNL WNL  Knee extension WNL WNL  Ankle dorsiflexion    Ankle plantarflexion    Ankle inversion    Ankle eversion     (Blank rows = not tested)  LOWER EXTREMITY MMT:    MMT Right eval Left eval  Hip flexion 4 4  Hip extension 3 4  Hip abduction 4 4  Hip adduction 4 4  Hip internal rotation 3 3  Hip  external rotation 3 3  Knee flexion 4 4  Knee extension  4 4  Ankle dorsiflexion 4 4  Ankle plantarflexion    Ankle inversion    Ankle eversion     (Blank rows = not tested)  LUMBAR SPECIAL TESTS:  Straight leg raise test: Positive  FUNCTIONAL TESTS:  5 times sit to stand: 26.7 seconds  GAIT: Distance walked: 80 ft. Assistive device utilized: None Level of assistance: Complete Independence Comments: Antalgic gait pattern, increased step width, decreased stride length  TREATMENT DATE: 03/28/2023  See evaluation/ HEP                                                                                                                             PATIENT EDUCATION:  Education details: Rounded shoulder and anterior pelvic tilt posture correction Person educated: Patient Education method: Demonstration Education comprehension: verbalized understanding  HOME EXERCISE PROGRAM: Access Code: [email or web address] URL: [email or web address] Date: 03/28/2023 Prepared by: Dorene Grebe Exercises - Supine Lower Trunk Rotation - 1 x daily - 7 x weekly - 3 sets - 10 reps - Supine Posterior Pelvic Tilt - 1 x daily - 7 x weekly - 3 sets - 10 reps - Supine Bridge - 1 x daily - 7 x weekly - 3 sets - 10 reps - Standing Hip Abduction with Unilateral Counter Support - 1 x daily - 7 x weekly - 3 sets - 10 reps - Standing 3-Way Leg Reach with Resistance at Ankles and Counter Support - 1 x daily - 7 x weekly - 3 sets - 10 reps - Standing Partial Lunge - 1 x daily - 7 x weekly - 3 sets - 10 reps   ASSESSMENT:  CLINICAL IMPRESSION: Patient is a 53 y.o. female who was seen today for physical therapy evaluation and treatment for chronic LBP. PT Initial evaluation was limited by pt. Current pain levels. Patient has hx of atraumatic LBP since 2023, and a fall episode provoking R lower quarter symptoms in 2023. Pt. Expressed that they have on-going R lower quarter symptoms for the past year. Pt. Presented with  decreased LE strength, B LE ROM deficits, MCI of pelvic tilt, and point tenderness to paraspinal muscles R > L. Pt. Will benefit from skilled PT interventions to address LE ROM, strength, and R lower symptoms.   OBJECTIVE IMPAIRMENTS: decreased activity tolerance.   ACTIVITY LIMITATIONS: Bending, standing tolerance, decreased ROM, decreased strength  PARTICIPATION LIMITATIONS: laundry  PERSONAL FACTORS: Fitness, past/current experiences, social background, and 1 comorbidity: DM, are also affecting patient's functional outcome.   REHAB POTENTIAL: Good  CLINICAL DECISION MAKING: Evolving/moderate complexity  EVALUATION COMPLEXITY: Moderate   GOALS: Goals reviewed with patient? No  SHORT TERM GOALS: Target date: 04/11/2023  Pt. Will be able to stand at their sink and wash dishes for 10 minutes continuously without episodes of severe LBP to increase participation in ADLs/IADLs. Baseline: Cannot stand longer than 4-5 minutes Goal status: INITIAL  2.  Pt. Will be able to demonstrate proper shoulder posture to correct their current forward head  and rounded shoulder resting posture. Baseline:  Goal status: INITIAL  LONG TERM GOALS: Target date: 05/23/2023  Pt. Will decrease modified Oswestry score from 52% self perceived disability to <40% self perceived disability. Baseline: 52% Goal status: INITIAL  2.  Pt. Will improve 5 time sit to stand time from 26.7 to 20 seconds to demonstrate improved LE strength and motor coordination. Baseline:  Goal status: INITIAL  3.  Pt. Will be able to walk 0.10 miles on treadmill at 8% incline to demonstrate increased participation with community ambulation. Baseline: 20 ft. Goal status: INITIAL  4.  Pt. Will be able to increase hip extension ROM to 15 degrees bilaterally to demonstrate improved gait and LE motor coordination.  Baseline: 7-8 degrees bilaterally Goal status: INITIAL   PLAN:  PT FREQUENCY: 1-2x/week  PT DURATION: 8  weeks  PLANNED INTERVENTIONS: 97110-Therapeutic exercises, 97530- Therapeutic activity, [id number]- Neuromuscular re-education, 97535- Self Care, [id number]- Manual therapy, and [id number]- Gait training.  PLAN FOR NEXT SESSION: Evaluate B UE function and refferal note for shoulder OA. Revise current HEP and re-assess lumbar AROM.   Cammie Mcgee, PT, DPT # [phone] 03/28/2023, 5:52 PM

## 2023-03-30 ENCOUNTER — Encounter: Payer: Self-pay | Admitting: Student in an Organized Health Care Education/Training Program

## 2023-03-30 ENCOUNTER — Ambulatory Visit
Payer: Medicaid Other | Attending: Student in an Organized Health Care Education/Training Program | Admitting: Student in an Organized Health Care Education/Training Program

## 2023-03-30 VITALS — BP 159/89 | Temp 97.5°F | Ht 63.0 in | Wt 129.0 lb

## 2023-03-30 DIAGNOSIS — M47816 Spondylosis without myelopathy or radiculopathy, lumbar region: Secondary | ICD-10-CM | POA: Diagnosis not present

## 2023-03-30 DIAGNOSIS — G8929 Other chronic pain: Secondary | ICD-10-CM | POA: Diagnosis not present

## 2023-03-30 DIAGNOSIS — M545 Low back pain, unspecified: Secondary | ICD-10-CM | POA: Diagnosis not present

## 2023-03-30 MED ORDER — CYCLOBENZAPRINE HCL 5 MG PO TABS
5.0000 mg | ORAL_TABLET | Freq: Three times a day (TID) | ORAL | 1 refills | Status: DC | PRN
Start: 2023-03-30 — End: 2023-08-17

## 2023-03-30 NOTE — Progress Notes (Signed)
PROVIDER NOTE: Information contained herein reflects review and annotations entered in association with encounter. Interpretation of such information and data should be left to medically-trained personnel. Information provided to patient can be located elsewhere in the medical [facility] under "Patient Instructions". Document created using STT-dictation technology, any transcriptional errors that may result from process are unintentional.    Patient: Claudia Mills  Service Category: E/M  Provider: Edward Jolly, MD  DOB: 02/07/1971  DOS: 03/30/2023  Referring Provider: Emogene Morgan, MD  MRN: [id number]  Specialty: Interventional Pain Management  PCP: Claudia Morgan, MD  Type: Established Patient  Setting: Ambulatory outpatient    Location: Office  Delivery: Face-to-face     HPI  [name], a 53 y.o. year old female, is here today because of her Lumbar spondylosis [M47.816]. Claudia Mills primary complain today is Back Pain (lower)  Pertinent problems: Claudia Mills has Lumbar spondylosis and Chronic bilateral low back pain without sciatica on their pertinent problem list. Pain Assessment: Severity of Chronic pain is reported as a 3 /10. Location: Back Left, Right/Denies. Onset: More than a month ago. Quality: Aching, Burning, Constant. Timing: Constant. Modifying factor(s): sit down, heating pad. Vitals:  height is 5\' 3"  (1.6 m) and weight is 129 lb (58.5 kg). Her temperature is 97.5 F (36.4 C) (abnormal). Her blood pressure is 159/89 (abnormal). Her oxygen saturation is 100%.  BMI: Estimated body mass index is 22.85 kg/m as calculated from the following:   Height as of this encounter: 5\' 3"  (1.6 m).   Weight as of this encounter: 129 lb (58.5 kg). Last encounter: 12/27/2022. Last procedure: 01/18/2023.  Reason for encounter:  History of Present Illness   Claudia Mills is a 53 year old female who presents with follow-up for lumbar medial branch nerve block treatment.   She previously received  bilateral L3, L4, L5 medial branch nerve block on 01/18/2023 which initially provided significant pain relief (80% for 1.5 months), but the pain is now returning. She is currently engaged in physical therapy as part of her pain management strategy.  Her current medications include Brilinta and aspirin 81 mg. She stopped both medications prior to her last procedure. She takes aspirin 81 mg regularly and is advised to stop Brilinta three days before any procedures.   She has been prescribed a muscle relaxer, which she plans to take at night to avoid daytime sedation. She also takes gabapentin at night, with no concerns about interactions between these medications.        ROS  Constitutional: Denies any fever or chills Gastrointestinal: No reported hemesis, hematochezia, vomiting, or acute GI distress Musculoskeletal:  +LBP Neurological: No reported episodes of acute onset apraxia, aphasia, dysarthria, agnosia, amnesia, paralysis, loss of coordination, or loss of consciousness  Medication Review  DULoxetine, Empagliflozin, alendronate, aspirin EC, cyclobenzaprine, gabapentin, glipiZIDE, ibuprofen, lisinopril, metoprolol succinate, nitroGLYCERIN, ondansetron, pantoprazole, and ticagrelor  History Review  Allergy: Claudia Mills is allergic to bee venom. Drug: Claudia Mills  reports current drug use. Drug: Marijuana. Alcohol:  reports that she does not currently use alcohol after a past usage of about 12.0 standard drinks of alcohol per week. Tobacco:  reports that she has quit smoking. She has never used smokeless tobacco. Social: Claudia Mills  reports that she has quit smoking. She has never used smokeless tobacco. She reports that she does not currently use alcohol after a past usage of about 12.0 standard drinks of alcohol per week. She reports current drug use. Drug: Marijuana. Medical:  has  a past medical history of Diabetes mellitus without complication (HCC), Hypertension, and MI (myocardial  infarction) (HCC). Surgical: Claudia Mills  has a past surgical history that includes Coronary angioplasty with stent; IR Radiologist Eval & Mgmt (09/20/2022); IR KYPHO LUMBAR INC FX REDUCE BONE BX UNI/BIL CANNULATION INC/IMAGING (10/06/2022); IR Radiologist Eval & Mgmt (10/13/2022); and IR Radiologist Eval & Mgmt (10/20/2022). Family: family history is not on file.  Laboratory Chemistry Profile   Renal Lab Results  Component Value Date   BUN 15 10/21/2022   CREATININE 0.95 10/21/2022   BCR 10 09/20/2022   GFRAA >60 12/30/2015   GFRNONAA >60 10/21/2022    Hepatic Lab Results  Component Value Date   AST 17 10/18/2022   ALT 12 10/18/2022   ALBUMIN 4.6 10/18/2022   ALKPHOS 126 10/18/2022   LIPASE 55 (H) 10/18/2022    Electrolytes Lab Results  Component Value Date   NA 124 (L) 10/21/2022   K 3.1 (L) 10/21/2022   CL 88 (L) 10/21/2022   CALCIUM 9.1 10/21/2022   MG 1.6 (L) 10/19/2022   PHOS 2.8 10/19/2022    Bone No results found for: "VD25OH", "VD125OH2TOT", "GN5621HY8", "MV7846NG2", "25OHVITD1", "25OHVITD2", "25OHVITD3", "TESTOFREE", "TESTOSTERONE"  Inflammation (CRP: Acute Phase) (ESR: Chronic Phase) Lab Results  Component Value Date   LATICACIDVEN 1.8 10/18/2022         Note: Above Lab results reviewed.  Recent Imaging Review  L1-L2: No significant disc herniation or stenosis.   L2-L3: Mild bony retropulsion at the level of the L2 inferior endplate, slightly progressed. Progressive disc bulge. Facet arthrosis and ligamentum flavum hypertrophy. Right greater than left subarticular narrowing. Mild narrowing of the central canal. No significant foraminal stenosis.   L3-L4: Disc bulge. Superimposed broad-based disc protrusion spanning the central and bilateral subarticular zones. Facet arthrosis and ligamentum flavum hypertrophy. Bilateral subarticular narrowing. Moderate central canal stenosis. No significant foraminal stenosis. As before, there is a large anterior disc  extrusion near midline which contacts the aorta.   L4-L5: Disc bulge with endplate osteophytes. Superimposed moderately large central disc extrusion with mild caudal migration. Facet arthrosis and ligamentum flavum hypertrophy. Severe bilateral subarticular and central canal stenosis. Bilateral neural foraminal narrowing (moderate/severe right, mild left).   L5-S1: Disc bulge with endplate spurring. Superimposed moderately enlarged central/left subarticular disc extrusion with mild cranial migration. Facet arthrosis and ligamentum flavum hypertrophy. The disc extrusion results in severe left subarticular stenosis and likely encroaches upon the descending left S1 nerve root. Mild right subarticular stenosis. Moderate/severe bilateral neural foraminal narrowing.   IMPRESSION: 1. L1 compression fracture (30-40% height loss) with vertically-oriented fracture through the vertebral body, new from the prior lumbar spine CT of 04/07/2021 and acute in appearance. 2. L2 inferior endplate vertebral compression fracture with progressive height loss as compared to the prior lumbar spine CT (now 40-50%). This is age-indeterminate but favored chronic given the degree of sclerosis at this site. 3. Apart from mild progression of a disc bulge at L2-L3, lumbar spondylosis is unchanged from the prior exam. Findings are most notably as follows. 4. At L3-L4, there is multifactorial bilateral subarticular narrowing and moderate central canal stenosis. 5. At L4-L5, there is multifactorial severe bilateral subarticular and central canal stenosis. Moderate/severe right neural foraminal and also present at this level. 6. At L5-S1, there is multifactorial severe left subarticular stenosis. Moderate/severe bilateral neural foraminal narrowing also present at this level. 7. Bilateral nonobstructive nephrolithiasis. 8. Distended urinary bladder, incompletely imaged. 9.  Aortic Atherosclerosis  (ICD10-I70.0).  Electronically Signed By: Ronaldo Miyamoto  Renette Butters D.O. On: 06/28/2022 18:24 Note: Reviewed        Physical Exam  General appearance: Well nourished, well developed, and well hydrated. In no apparent acute distress Mental status: Alert, oriented x 3 (person, place, & time)       Respiratory: No evidence of acute respiratory distress Eyes: PERLA Vitals: BP (!) 159/89   Temp (!) 97.5 F (36.4 C)   Ht 5\' 3"  (1.6 m)   Wt 129 lb (58.5 kg)   LMP 09/29/2015 (Approximate) Comment: neg. preg test  SpO2 100%   BMI 22.85 kg/m  BMI: Estimated body mass index is 22.85 kg/m as calculated from the following:   Height as of this encounter: 5\' 3"  (1.6 m).   Weight as of this encounter: 129 lb (58.5 kg). Ideal: Ideal body weight: 52.4 kg (115 lb 8.3 oz) Adjusted ideal body weight: 54.8 kg (120 lb 14.6 oz)  Lumbar Spine Area Exam  Skin & Axial Inspection: No masses, redness, or swelling Alignment: Symmetrical Functional ROM: Pain restricted ROM affecting both sides Stability: No instability detected Muscle Tone/Strength: Functionally intact. No obvious neuro-muscular anomalies detected. Sensory (Neurological): Musculoskeletal pain pattern Palpation: No palpable anomalies       Provocative Tests: Hyperextension/rotation test: (+) bilaterally for facet joint pain. Lumbar quadrant test (Kemp's test): (+) bilaterally for facet joint pain.   Lower Extremity Exam      Side: Right lower extremity   Side: Left lower extremity  Stability: No instability observed           Stability: No instability observed          Skin & Extremity Inspection: Skin color, temperature, and hair growth are WNL. No peripheral edema or cyanosis. No masses, redness, swelling, asymmetry, or associated skin lesions. No contractures.   Skin & Extremity Inspection: Skin color, temperature, and hair growth are WNL. No peripheral edema or cyanosis. No masses, redness, swelling, asymmetry, or associated skin lesions. No  contractures.  Functional ROM: Unrestricted ROM                   Functional ROM: Unrestricted ROM                  Muscle Tone/Strength: Functionally intact. No obvious neuro-muscular anomalies detected.   Muscle Tone/Strength: Functionally intact. No obvious neuro-muscular anomalies detected.  Sensory (Neurological): Unimpaired         Sensory (Neurological): Unimpaired        DTR: Patellar: deferred today Achilles: deferred today Plantar: deferred today   DTR: Patellar: deferred today Achilles: deferred today Plantar: deferred today  Palpation: No palpable anomalies   Palpation: No palpable anomalies       Assessment   Diagnosis Status  1. Lumbar spondylosis   2. Lumbar facet arthropathy   3. Chronic bilateral low back pain without sciatica    Having a Flare-up Having a Flare-up Having a Flare-up   Updated Problems: Problem  Lumbar Spondylosis    Plan of Care   Low back pain related to lumbar facet arthropathy Status post positive diagnostic lumbar facet medial branch nerve block #1 done 01/18/2023 that provided 80% pain relief for about 1.5 months.  Given return of axial low back pain related to lumbar facet arthropathy, discussed repeating diagnostic lumbar facet nerve block #2 followed by RFA  Muscle Spasms   There is a request for pain medication and muscle relaxers. Muscle relaxers have been beneficial in the past, and there is agreement to take them at night  due to sedative effects. She will not interfere with the current gabapentin regimen. Prescribe 90 tablets of muscle relaxer, advise taking them at night, reassure no interference with gabapentin, and instruct to take as needed.   Ms. Raymie Trani has a current medication list which includes the following long-term medication(s): duloxetine, gabapentin, glipizide, lisinopril, metoprolol succinate, and pantoprazole.  Pharmacotherapy (Medications Ordered): Meds ordered this encounter  Medications    cyclobenzaprine (FLEXERIL) 5 MG tablet    Sig: Take 1 tablet (5 mg total) by mouth 3 (three) times daily as needed for muscle spasms.    Dispense:  90 tablet    Refill:  1    Do not place this medication, or any other prescription from our practice, on "Automatic Refill". Patient may have prescription filled one day early if pharmacy is closed on scheduled refill date.   Orders:  Orders Placed This Encounter  Procedures   LUMBAR FACET(MEDIAL BRANCH NERVE BLOCK) MBNB    Standing Status:   Future    Expiration Date:   06/27/2023    Scheduling Instructions:     Procedure: Lumbar facet block (AKA.: Lumbosacral medial branch nerve block)     Side: Bilateral     Level: L3-4, L4-5, Facets ( L3, L4, L5,  Medial Branch)     Sedation: with,      STOP Brilinta 3 days prior     Timeframe: ASAA    Where will this procedure be performed?:   [facility]   Follow-up plan:   Return in about 27 days (around 04/26/2023) for B/L L3, 4, 5 MBNB #2 , in clinic IV Versed (stop Brilinta 3 days prior).      BLF L3-5 01/18/23     Recent Visits Date Type Provider Dept  01/18/23 Procedure visit Claudia Jolly, MD [facility]  Showing recent visits within past 90 days and meeting all other requirements Today's Visits Date Type Provider Dept  03/30/23 Office Visit Claudia Jolly, MD [facility]  Showing today's visits and meeting all other requirements Future Appointments No visits were found meeting these conditions. Showing future appointments within next 90 days and meeting all other requirements  I discussed the assessment and treatment plan with the patient. The patient was provided an opportunity to ask questions and all were answered. The patient agreed with the plan and demonstrated an understanding of the instructions.  Patient advised to call back or seek an in-person evaluation if the symptoms or condition worsens.  Duration of encounter: .  Total time on  encounter, as per AMA guidelines included both the face-to-face and non-face-to-face time personally spent by the physician and/or other qualified health care professional(s) on the day of the encounter (includes time in activities that require the physician or other qualified health care professional and does not include time in activities normally performed by clinical staff). Physician's time may include the following activities when performed: Preparing to see the patient (e.g., pre-charting review of records, searching for previously ordered imaging, lab work, and nerve conduction tests) Review of prior analgesic pharmacotherapies. Reviewing PMP Interpreting ordered tests (e.g., lab work, imaging, nerve conduction tests) Performing post-procedure evaluations, including interpretation of diagnostic procedures Obtaining and/or reviewing separately obtained history Performing a medically appropriate examination and/or evaluation Counseling and educating the patient/family/caregiver Ordering medications, tests, or procedures Referring and communicating with other health care professionals (when not separately reported) Documenting clinical information in the electronic or other health [facility] Independently interpreting results (not separately reported) and communicating results  to the patient/ family/caregiver Care coordination (not separately reported)  Note by: Claudia Jolly, MD Date: 03/30/2023; Time: 3:00 PM

## 2023-03-30 NOTE — Progress Notes (Signed)
Safety precautions to be maintained throughout the outpatient stay will include: orient to surroundings, keep bed in low position, maintain call bell within reach at all times, provide assistance with transfer out of bed and ambulation.

## 2023-03-30 NOTE — Patient Instructions (Addendum)
Stop Brilinta 3 days prior ______________________________________________________________________    Preparing for your procedure  Appointments: If you think you may not be able to keep your appointment, call 24-48 hours in advance to cancel. We need time to make it available to others.  Procedure visits are for procedures only. During your procedure appointment there will be: NO Prescription Refills*. NO medication changes or discussions*. NO discussion of disability issues*. NO unrelated pain problem evaluations*. NO evaluations to order other pain procedures*. *These will be addressed at a separate and distinct evaluation encounter on the provider's evaluation schedule and not during procedure days.  Instructions: Food intake: Avoid eating anything solid for at least 8 hours prior to your procedure. Clear liquid intake: You may take clear liquids such as water up to 2 hours prior to your procedure. (No carbonated drinks. No soda.) Transportation: Unless otherwise stated by your physician, bring a driver. (Driver cannot be a Market researcher, Pharmacist, community, or any other form of public transportation.) Morning Medicines: Except for blood thinners, take all of your other morning medications with a sip of water. Make sure to take your heart and blood pressure medicines. If your blood pressure's lower number is above 100, the case will be rescheduled. Blood thinners: Make sure to stop your blood thinners as instructed.  If you take a blood thinner, but were not instructed to stop it, call our office [phone] and ask to talk to a nurse. Not stopping a blood thinner prior to certain procedures could lead to serious complications. Diabetics on insulin: Notify the staff so that you can be scheduled 1st case in the morning. If your diabetes requires high dose insulin, take only  of your normal insulin dose the morning of the procedure and notify the staff that you have done so. Preventing infections: Shower with  an antibacterial soap the morning of your procedure.  Build-up your immune system: Take 1000 mg of Vitamin C with every meal (3 times a day) the day prior to your procedure. Antibiotics: Inform the nursing staff if you are taking any antibiotics or if you have any conditions that may require antibiotics prior to procedures. (Example: recent joint implants)   Pregnancy: If you are pregnant make sure to notify the nursing staff. Not doing so may result in injury to the fetus, including death.  Sickness: If you have a cold, fever, or any active infections, call and cancel or reschedule your procedure. Receiving steroids while having an infection may result in complications. Arrival: You must be in the facility at least 30 minutes prior to your scheduled procedure. Tardiness: Your scheduled time is also the cutoff time. If you do not arrive at least 15 minutes prior to your procedure, you will be rescheduled.  Children: Do not bring any children with you. Make arrangements to keep them home. Dress appropriately: There is always a possibility that your clothing may get soiled. Avoid long dresses. Valuables: Do not bring any jewelry or valuables.  Reasons to call and reschedule or cancel your procedure: (Following these recommendations will minimize the risk of a serious complication.) Surgeries: Avoid having procedures within 2 weeks of any surgery. (Avoid for 2 weeks before or after any surgery). Flu Shots: Avoid having procedures within 2 weeks of a flu shots or . (Avoid for 2 weeks before or after immunizations). Barium: Avoid having a procedure within 7-10 days after having had a radiological study involving the use of radiological contrast. (Myelograms, Barium swallow or enema study). Heart attacks: Avoid any elective  procedures or surgeries for the initial 6 months after a "Myocardial Infarction" (Heart Attack). Blood thinners: It is imperative that you stop these medications before procedures. Let  us know if you if you take any blood thinner.  Infection: Avoid procedures during or within two weeks of an infection (including chest colds or gastrointestinal problems). Symptoms associated with infections include: Localized redness, fever, chills, night sweats or profuse sweating, burning sensation when voiding, cough, congestion, stuffiness, runny nose, sore throat, diarrhea, nausea, vomiting, cold or Flu symptoms, recent or current infections. It is specially important if the infection is over the area that we intend to treat. Heart and lung problems: Symptoms that may suggest an active cardiopulmonary problem include: cough, chest pain, breathing difficulties or shortness of breath, dizziness, ankle swelling, uncontrolled high or unusually low blood pressure, and/or palpitations. If you are experiencing any of these symptoms, cancel your procedure and contact your primary care physician for an evaluation.  Remember:  Regular Business hours are:  Monday to Thursday 8:00 AM to 4:00 PM  Provider's Schedule: Delano Metz, MD:  Procedure days: Tuesday and Thursday 7:30 AM to 4:00 PM  Edward Jolly, MD:  Procedure days: Monday and Wednesday 7:30 AM to 4:00 PM Last  Updated: 01/24/2023 ______________________________________________________________________    Stop Brilinta 3 days before injection

## 2023-04-04 ENCOUNTER — Ambulatory Visit: Payer: Medicaid Other | Admitting: Physical Therapy

## 2023-04-04 DIAGNOSIS — F330 Major depressive disorder, recurrent, mild: Secondary | ICD-10-CM | POA: Diagnosis not present

## 2023-04-06 ENCOUNTER — Ambulatory Visit: Payer: Medicaid Other | Admitting: Physical Therapy

## 2023-04-11 ENCOUNTER — Ambulatory Visit: Payer: Medicaid Other | Admitting: Physical Therapy

## 2023-04-11 ENCOUNTER — Telehealth: Payer: Self-pay | Admitting: Orthopedic Surgery

## 2023-04-11 ENCOUNTER — Encounter: Payer: Self-pay | Admitting: Physical Therapy

## 2023-04-11 DIAGNOSIS — M6281 Muscle weakness (generalized): Secondary | ICD-10-CM | POA: Diagnosis not present

## 2023-04-11 DIAGNOSIS — M2560 Stiffness of unspecified joint, not elsewhere classified: Secondary | ICD-10-CM

## 2023-04-11 DIAGNOSIS — M5459 Other low back pain: Secondary | ICD-10-CM | POA: Diagnosis not present

## 2023-04-11 DIAGNOSIS — M47816 Spondylosis without myelopathy or radiculopathy, lumbar region: Secondary | ICD-10-CM | POA: Diagnosis not present

## 2023-04-11 NOTE — Telephone Encounter (Signed)
 Okay to do work note keeping her out until her follow up with me.

## 2023-04-11 NOTE — Telephone Encounter (Signed)
 Letter placed at front desk to be mailed to patient.

## 2023-04-11 NOTE — Therapy (Signed)
 OUTPATIENT PHYSICAL THERAPY THORACOLUMBAR TREATMENT  Patient Name: Claudia Mills MRN: [id number] DOB:December 25, 1970, 53 y.o., female Today's Date: 04/11/2023  END OF SESSION:  PT End of Session - 04/11/23 1245     Visit Number 2    Number of Visits 16    Date for PT Re-Evaluation 05/23/23    PT Start Time 1345    PT Stop Time 1433    PT Time Calculation (min) 48 min    Activity Tolerance Patient limited by pain    Behavior During Therapy [facility] for tasks assessed/performed             Past Medical History:  Diagnosis Date   Diabetes mellitus without complication (HCC)    Hypertension    MI (myocardial infarction) (HCC)    Past Surgical History:  Procedure Laterality Date   CORONARY ANGIOPLASTY WITH STENT PLACEMENT     IR KYPHO LUMBAR INC FX REDUCE BONE BX UNI/BIL CANNULATION INC/IMAGING  10/06/2022   IR RADIOLOGIST EVAL & MGMT  09/20/2022   IR RADIOLOGIST EVAL & MGMT  10/13/2022   IR RADIOLOGIST EVAL & MGMT  10/20/2022   Patient Active Problem List   Diagnosis Date Noted   Lumbar spondylosis 12/27/2022   Compression fracture of L1 lumbar vertebra (HCC) 12/27/2022   Chronic bilateral low back pain without sciatica 12/27/2022   Hyperglycemia 10/19/2022   Dehydration 10/19/2022   Nausea and vomiting 10/18/2022   DKA, type 2, not at goal [facility]) 10/18/2022   Essential hypertension 01/31/2019   NSTEMI (non-ST elevated myocardial infarction) (HCC) 01/31/2019    PCP: Emogene Morgan, MD  REFERRING PROVIDER: Drake Leach, PA-C  REFERRING DIAG:   Rationale for Evaluation and Treatment: Rehabilitation  THERAPY DIAG:  Other low back pain  Muscle weakness (generalized)  Joint stiffness of spine  ONSET DATE: 10/11/2022  SUBJECTIVE:                                                                                                                                                                                           SUBJECTIVE STATEMENT:  Pt. Is a 53 year old female with c/o  chronic B LBP and RLE radicular symptoms. Pt. Emphasizes R LBP to be greater than left. Pt. Describes that they are struggling to perform ADLs/IADLs independently without taking seated rests to ease LBP when standing/bending in any direction.  Pt. Reports they want to be able to remain independent with ADLs/IADLs and return to work. Pt. Expresses that they are dealing with financial limitations related to getting put on disability or finding a different job that does not provoke their LBP. Pt. Notes their current gabapentin dosage  helps to decrease burning sensations in B LE at night. Pt. Describes that all standing activities and walking uphill significantly increases their LBP. Pt. Expresses that they had a history of falls over the past year related to uncontrolled DM. Pt. Confirms they have not had LOB issues since managing their DM.  PERTINENT HISTORY:  - DM without complications, currently controlled - H/o HTN, CAD, and DM  - L1 kyphoplasty on 10/06/22   PAIN:  Are you having pain? Yes: NPRS scale: 4 Pain location: Lumbar spine and RLE Pain description: Sharp/Shooting/Radicular Symptoms Aggravating factors: Walking uphill, standing greater then 5 minutes Relieving factors: Resting in seated position  PRECAUTIONS: None  RED FLAGS: Negative for bowel/bladder changes, saddle paresthesia, h/o spinal tumor, personal hx of cancer, and h/o abdominal aneurysm.   WEIGHT BEARING RESTRICTIONS: No  FALLS:  Has patient fallen in last 6 months? Yes. Number of falls 5, all related to previously uncontrolled DM  LIVING ENVIRONMENT: Lives with: lives with their family Lives in: House/apartment Stairs: Yes: External: 3-4 steps; can reach both and bilateral but cannot reach both Has following equipment at home: None  OCCUPATION: Unemployed working towards disability  PLOF: Needs assistance with ADLs  PATIENT GOALS:   - Be able to stand for greater than 15 minutes without LBP/radicular  symptoms in RLE - Correct forward head/shoulder posture - Increase B LE Strength - Be able to wash dishes at counter without breaks - Return to work/find a new job.   NEXT MD VISIT: PRN  OBJECTIVE:   Note: Objective measures were completed at Evaluation unless otherwise noted.  - Kyphotic posture - Increased step width - Decreased stride length - Forward shoulder positioning - UE Mod. Assist on legs for sit to stand and transitions   DIAGNOSTIC FINDINGS:   Xrays from 10/13/22 (after procedure) show no new compression fractures. Previous MRI showed lumbar spondylosis with DDD, moderate central stenosis L4-L5 with left lateral recess stenosis and mild/moderate foraminal stenosis on left at L5-S1.   PATIENT SURVEYS:  Modified Oswestry 52%   COGNITION: Overall cognitive status: Within functional limits for tasks assessed   SENSATION: Light touch: Impaired   MUSCLE LENGTH: Hamstrings: Not examined Thomas test: Not examined  POSTURE: decreased lumbar lordosis, increased thoracic kyphosis, forward head, rounded shoulders  PALPATION: - Point tenderness to B lumbar paraspinal muscle groups - Point tenderness to B SIJ/PSIS  LUMBAR ROM: Deferred do to pain  AROM eval  Flexion   Extension   Right lateral flexion   Left lateral flexion   Right rotation   Left rotation    (Blank rows = not tested)  LOWER EXTREMITY ROM:     Active  Right eval Left eval  Hip flexion 86 80  Hip extension 8 7  Hip abduction 19 24  Hip adduction 18 16  Hip internal rotation    Hip external rotation    Knee flexion WNL WNL  Knee extension WNL WNL  Ankle dorsiflexion    Ankle plantarflexion    Ankle inversion    Ankle eversion     (Blank rows = not tested)  LOWER EXTREMITY MMT:    MMT Right eval Left eval  Hip flexion 4 4  Hip extension 3 4  Hip abduction 4 4  Hip adduction 4 4  Hip internal rotation 3 3  Hip external rotation 3 3  Knee flexion 4 4  Knee extension 4 4   Ankle dorsiflexion 4 4  Ankle plantarflexion    Ankle inversion  Ankle eversion     (Blank rows = not tested)  LUMBAR SPECIAL TESTS:  Straight leg raise test: Positive  FUNCTIONAL TESTS:  5 times sit to stand: 26.7 seconds  GAIT: Distance walked: 80 ft. Assistive device utilized: None Level of assistance: Complete Independence Comments: Antalgic gait pattern, increased step width, decreased stride length  TREATMENT DATE: 04/11/2023  Subjective:  - Pt. Arrives to treatment session on time and prepared. - Pt. Describes that they have a nerve block scheduled for 05/03/2023. - Pt. NPS 3/10 for generalized LBP and with transferring to and from cars.  - Pt. HEP Compliance: Pt. States they have been performing their standing exercises more than supine ones. Pt. Notes they do exercises every other day.  - Pt. Expresses that they are looking forward to their nerve block in the coming months.  Manual Therapy:  - STM of B paraspinal musculature. Deep tissue work to erector spinae musculature. 5 Minutes.  - CPA L1-L5 Grade III 3 x 30 Seconds each.  - R UPA L1-L5 Grade III 3 x 30 Seconds each.  Therapeutic Exercise:  - Nu-Step 10 Minutes L4. Moist heatWarm-up  - Prone Extension 3 x10  - Supine 90/90 hip/knee trunk rotation 3 x10  - Modified plantigrade bird dog with 4# ankle weights and 3# dumbells in hands. 3 x 15. Verbal cuing to maintain of neutral pelvic tilt during exercises.  Therapeutic Activity:   - Blaze Pods: Modified plantigrade planking at plinth. Focus setting 6 sets of 45 seconds. RPE /10 and NPS /10  - Transverse abdominus activation exercise in supine. 5 Minutes.   - Core activation with spine pelvic tilt. Rectus abdominus specifically. 5 Minutes.                                                                                                           PATIENT EDUCATION:  Education details: Rounded shoulder and anterior pelvic tilt posture  correction Person educated: Patient Education method: Demonstration Education comprehension: verbalized understanding  HOME EXERCISE PROGRAM: Access Code: [email or web address] URL: [email or web address] Date: 03/28/2023 Prepared by: Dorene Grebe Exercises - Supine Lower Trunk Rotation - 1 x daily - 7 x weekly - 3 sets - 10 reps - Supine Posterior Pelvic Tilt - 1 x daily - 7 x weekly - 3 sets - 10 reps - Supine Bridge - 1 x daily - 7 x weekly - 3 sets - 10 reps - Standing Hip Abduction with Unilateral Counter Support - 1 x daily - 7 x weekly - 3 sets - 10 reps - Standing 3-Way Leg Reach with Resistance at Ankles and Counter Support - 1 x daily - 7 x weekly - 3 sets - 10 reps - Standing Partial Lunge - 1 x daily - 7 x weekly - 3 sets - 10 reps   ASSESSMENT:  CLINICAL IMPRESSION:  Today's treatment session focused on assessing current HEP exercise performance quality and addressing core activation/lumbar stabilization deficits. Pt. Exhibited improvements with lumbar AROM following manual therapy interventions and was able to perform therapeutic exercises with NPS pain staying  below 3/10. PT Educated patient about cues to maintain neutral pelvis and increasing abdominal musculature activation when performing dynamic activities, specifically with lifting mechanics. Pt. Will benefit from skilled PT interventions to address LE ROM, strength, and R lower quarter symptoms.   OBJECTIVE IMPAIRMENTS: decreased activity tolerance.   ACTIVITY LIMITATIONS: Bending, standing tolerance, decreased ROM, decreased strength  PARTICIPATION LIMITATIONS: laundry  PERSONAL FACTORS: Fitness, past/current experiences, social background, and 1 comorbidity: DM, are also affecting patient's functional outcome.   REHAB POTENTIAL: Good  CLINICAL DECISION MAKING: Evolving/moderate complexity  EVALUATION COMPLEXITY: Moderate   GOALS: Goals reviewed with patient? No  SHORT TERM GOALS: Target date: 04/11/2023  Pt.  Will be able to stand at their sink and wash dishes for 10 minutes continuously without episodes of severe LBP to increase participation in ADLs/IADLs. Baseline: Cannot stand longer than 4-5 minutes Goal status: INITIAL  2.  Pt. Will be able to demonstrate proper shoulder posture to correct their current forward head and rounded shoulder resting posture. Baseline:  Goal status: INITIAL  LONG TERM GOALS: Target date: 05/23/2023  Pt. Will decrease modified Oswestry score from 52% self perceived disability to <40% self perceived disability. Baseline: 52% Goal status: INITIAL  2.  Pt. Will improve 5 time sit to stand time from 26.7 to 20 seconds to demonstrate improved LE strength and motor coordination. Baseline:  Goal status: INITIAL  3.  Pt. Will be able to walk 0.10 miles on treadmill at 8% incline to demonstrate increased participation with community ambulation. Baseline: 20 ft. Goal status: INITIAL  4.  Pt. Will be able to increase hip extension ROM to 15 degrees bilaterally to demonstrate improved gait and LE motor coordination.  Baseline: 7-8 degrees bilaterally Goal status: INITIAL   PLAN:  PT FREQUENCY: 1-2x/week  PT DURATION: 8 weeks  PLANNED INTERVENTIONS: 97110-Therapeutic exercises, 97530- Therapeutic activity, [id number]- Neuromuscular re-education, 97535- Self Care, [id number]- Manual therapy, and 97116- Gait training.  PLAN FOR NEXT SESSION: Revise current HEP/ update goals.     Cammie Mcgee, PT, DPT # [phone] 04/11/2023, 2:50 PM   Milford Cage, SPT

## 2023-04-11 NOTE — Telephone Encounter (Signed)
-----   Message from Patrisia C sent at 04/11/2023  1:05 PM EST ----- Patient said that her injection 05/03/2023. She rescheduled to 4/16 can she get anew work note to keep her out of work until then. She requested it to be mailed to her. ----- Message ----- From: Drake Leach, Cordelia Poche Sent: 04/10/2023   8:56 AM EST To: [facility]  She had f/u with me next Monday for recheck after lumbar MBB.  Looks like she is being scheduled for these MBB in mid March per Lateef's notes (no appt scheduled yet).   Please push back her f/u with me for 4-6 weeks so she has time to get MBB and then we can see how she is doing.   Thanks.

## 2023-04-13 ENCOUNTER — Ambulatory Visit: Payer: Medicaid Other | Admitting: Physical Therapy

## 2023-04-17 ENCOUNTER — Ambulatory Visit: Payer: Medicaid Other | Admitting: Orthopedic Surgery

## 2023-04-18 ENCOUNTER — Ambulatory Visit: Payer: Medicaid Other | Attending: Orthopedic Surgery | Admitting: Physical Therapy

## 2023-04-18 DIAGNOSIS — M6281 Muscle weakness (generalized): Secondary | ICD-10-CM | POA: Insufficient documentation

## 2023-04-18 DIAGNOSIS — M2560 Stiffness of unspecified joint, not elsewhere classified: Secondary | ICD-10-CM | POA: Insufficient documentation

## 2023-04-18 DIAGNOSIS — M5459 Other low back pain: Secondary | ICD-10-CM | POA: Insufficient documentation

## 2023-04-20 ENCOUNTER — Ambulatory Visit: Payer: Medicaid Other | Admitting: Physical Therapy

## 2023-04-25 ENCOUNTER — Ambulatory Visit: Payer: Medicaid Other | Admitting: Physical Therapy

## 2023-04-27 ENCOUNTER — Ambulatory Visit: Payer: Medicaid Other | Admitting: Physical Therapy

## 2023-05-01 DIAGNOSIS — F330 Major depressive disorder, recurrent, mild: Secondary | ICD-10-CM | POA: Diagnosis not present

## 2023-05-02 ENCOUNTER — Ambulatory Visit: Payer: Medicaid Other | Admitting: Physical Therapy

## 2023-05-02 ENCOUNTER — Encounter: Payer: Self-pay | Admitting: Physical Therapy

## 2023-05-02 DIAGNOSIS — M2560 Stiffness of unspecified joint, not elsewhere classified: Secondary | ICD-10-CM | POA: Diagnosis not present

## 2023-05-02 DIAGNOSIS — M6281 Muscle weakness (generalized): Secondary | ICD-10-CM | POA: Diagnosis not present

## 2023-05-02 DIAGNOSIS — M5459 Other low back pain: Secondary | ICD-10-CM

## 2023-05-02 NOTE — Therapy (Signed)
 OUTPATIENT PHYSICAL THERAPY THORACOLUMBAR TREATMENT  Patient Name: Claudia Mills MRN: [id number] DOB:April 01, 1970, 53 y.o., female Today's Date: 05/02/2023  END OF SESSION:  PT End of Session - 05/02/23 1419     Visit Number 3    Number of Visits 16    Date for PT Re-Evaluation 05/23/23    PT Start Time 1416    PT Stop Time 1504    PT Time Calculation (min) 48 min    Activity Tolerance Patient limited by pain    Behavior During Therapy [facility] for tasks assessed/performed             Past Medical History:  Diagnosis Date   Diabetes mellitus without complication (HCC)    Hypertension    MI (myocardial infarction) (HCC)    Past Surgical History:  Procedure Laterality Date   CORONARY ANGIOPLASTY WITH STENT PLACEMENT     IR KYPHO LUMBAR INC FX REDUCE BONE BX UNI/BIL CANNULATION INC/IMAGING  10/06/2022   IR RADIOLOGIST EVAL & MGMT  09/20/2022   IR RADIOLOGIST EVAL & MGMT  10/13/2022   IR RADIOLOGIST EVAL & MGMT  10/20/2022   Patient Active Problem List   Diagnosis Date Noted   Lumbar spondylosis 12/27/2022   Compression fracture of L1 lumbar vertebra (HCC) 12/27/2022   Chronic bilateral low back pain without sciatica 12/27/2022   Hyperglycemia 10/19/2022   Dehydration 10/19/2022   Nausea and vomiting 10/18/2022   DKA, type 2, not at goal [facility]) 10/18/2022   Essential hypertension 01/31/2019   NSTEMI (non-ST elevated myocardial infarction) (HCC) 01/31/2019    PCP: Emogene Morgan, MD  REFERRING PROVIDER: Drake Leach, PA-C  REFERRING DIAG:   Rationale for Evaluation and Treatment: Rehabilitation  THERAPY DIAG:  Other low back pain  Muscle weakness (generalized)  Joint stiffness of spine  ONSET DATE: 10/11/2022  SUBJECTIVE:                                                                                                                                                                                           SUBJECTIVE STATEMENT:  Pt. Is a 53 year old female with c/o  chronic B LBP and RLE radicular symptoms. Pt. Emphasizes R LBP to be greater than left. Pt. Describes that they are struggling to perform ADLs/IADLs independently without taking seated rests to ease LBP when standing/bending in any direction.  Pt. Reports they want to be able to remain independent with ADLs/IADLs and return to work. Pt. Expresses that they are dealing with financial limitations related to getting put on disability or finding a different job that does not provoke their LBP. Pt. Notes their current gabapentin dosage  helps to decrease burning sensations in B LE at night. Pt. Describes that all standing activities and walking uphill significantly increases their LBP. Pt. Expresses that they had a history of falls over the past year related to uncontrolled DM. Pt. Confirms they have not had LOB issues since managing their DM.  PERTINENT HISTORY:  - DM without complications, currently controlled - H/o HTN, CAD, and DM  - L1 kyphoplasty on 10/06/22   PAIN:  Are you having pain? Yes: NPRS scale: 4 Pain location: Lumbar spine and RLE Pain description: Sharp/Shooting/Radicular Symptoms Aggravating factors: Walking uphill, standing greater then 5 minutes Relieving factors: Resting in seated position  PRECAUTIONS: None  RED FLAGS: Negative for bowel/bladder changes, saddle paresthesia, h/o spinal tumor, personal hx of cancer, and h/o abdominal aneurysm.   WEIGHT BEARING RESTRICTIONS: No  FALLS:  Has patient fallen in last 6 months? Yes. Number of falls 5, all related to previously uncontrolled DM  LIVING ENVIRONMENT: Lives with: lives with their family Lives in: House/apartment Stairs: Yes: External: 3-4 steps; can reach both and bilateral but cannot reach both Has following equipment at home: None  OCCUPATION: Unemployed working towards disability  PLOF: Needs assistance with ADLs  PATIENT GOALS:   - Be able to stand for greater than 15 minutes without LBP/radicular  symptoms in RLE - Correct forward head/shoulder posture - Increase B LE Strength - Be able to wash dishes at counter without breaks - Return to work/find a new job.   NEXT MD VISIT: PRN  OBJECTIVE:   Note: Objective measures were completed at Evaluation unless otherwise noted.  - Kyphotic posture - Increased step width - Decreased stride length - Forward shoulder positioning - UE Mod. Assist on legs for sit to stand and transitions   DIAGNOSTIC FINDINGS:   Xrays from 10/13/22 (after procedure) show no new compression fractures. Previous MRI showed lumbar spondylosis with DDD, moderate central stenosis L4-L5 with left lateral recess stenosis and mild/moderate foraminal stenosis on left at L5-S1.   PATIENT SURVEYS:  Modified Oswestry 52%   COGNITION: Overall cognitive status: Within functional limits for tasks assessed   SENSATION: Light touch: Impaired   MUSCLE LENGTH: Hamstrings: Not examined Thomas test: Not examined  POSTURE: decreased lumbar lordosis, increased thoracic kyphosis, forward head, rounded shoulders  PALPATION: - Point tenderness to B lumbar paraspinal muscle groups - Point tenderness to B SIJ/PSIS  LUMBAR ROM: Deferred do to pain  AROM eval  Flexion   Extension   Right lateral flexion   Left lateral flexion   Right rotation   Left rotation    (Blank rows = not tested)  LOWER EXTREMITY ROM:     Active  Right eval Left eval  Hip flexion 86 80  Hip extension 8 7  Hip abduction 19 24  Hip adduction 18 16  Hip internal rotation    Hip external rotation    Knee flexion WNL WNL  Knee extension WNL WNL  Ankle dorsiflexion    Ankle plantarflexion    Ankle inversion    Ankle eversion     (Blank rows = not tested)  LOWER EXTREMITY MMT:    MMT Right eval Left eval  Hip flexion 4 4  Hip extension 3 4  Hip abduction 4 4  Hip adduction 4 4  Hip internal rotation 3 3  Hip external rotation 3 3  Knee flexion 4 4  Knee extension 4 4   Ankle dorsiflexion 4 4  Ankle plantarflexion    Ankle inversion  Ankle eversion     (Blank rows = not tested)  LUMBAR SPECIAL TESTS:  Straight leg raise test: Positive  FUNCTIONAL TESTS:  5 times sit to stand: 26.7 seconds  GAIT: Distance walked: 80 ft. Assistive device utilized: None Level of assistance: Complete Independence Comments: Antalgic gait pattern, increased step width, decreased stride length  TREATMENT DATE: 05/02/2023  Subjective:  - Pt. Arrives to treatment session on time and prepared. - Pt. Describes that they have a nerve block scheduled for 05/03/2023. - Pt. NPS 2/10 for generalized LBP and with transferring to and from cars.  - Pt. HEP Compliance: Pt. States they have been performing their standing exercises more than supine ones. Pt. Notes they do exercises about 1x/week.  Pt. Walks outside with grand daughter every over weekend.  Pt. Missed last couple of PT tx. Session secondary to transportation issues.    Therapeutic Exercise:  - Nu-Step L4 for 10 Minutes with B UE/LE.  MH to low back.  - reassessment of standing lumbar AROM: limited extension  - Prone Extension 2 x 30 seconds  - Supine 90/90 hip/knee trunk rotation 3 x10  - Seated TrA ex.: marching/ alt. UE and LE 20x each with posture feedback.    Manual Therapy:  - supine lumbar/ B LE stretches/ sciatic nerve glides 3x on L/R.    - noted LLD L>R in supine position.    - STM of B paraspinal musculature. Deep tissue work to erector spinae musculature. 5 Minutes with use of Hypervolt  - No mobs today   NOT TODAY - Blaze Pods: Modified plantigrade planking at plinth. Focus setting 6 sets of 45 seconds. RPE /10 and NPS /10  - Transverse abdominus activation exercise in supine. 5 Minutes.   - Core activation with spine pelvic tilt. Rectus abdominus specifically. 5 Minutes.                                                                                                           PATIENT  EDUCATION:  Education details: Rounded shoulder and anterior pelvic tilt posture correction Person educated: Patient Education method: Demonstration Education comprehension: verbalized understanding  HOME EXERCISE PROGRAM: Access Code: [email or web address] URL: [email or web address] Date: 03/28/2023 Prepared by: Dorene Grebe Exercises - Supine Lower Trunk Rotation - 1 x daily - 7 x weekly - 3 sets - 10 reps - Supine Posterior Pelvic Tilt - 1 x daily - 7 x weekly - 3 sets - 10 reps - Supine Bridge - 1 x daily - 7 x weekly - 3 sets - 10 reps - Standing Hip Abduction with Unilateral Counter Support - 1 x daily - 7 x weekly - 3 sets - 10 reps - Standing 3-Way Leg Reach with Resistance at Ankles and Counter Support - 1 x daily - 7 x weekly - 3 sets - 10 reps - Standing Partial Lunge - 1 x daily - 7 x weekly - 3 sets - 10 reps   ASSESSMENT:  CLINICAL IMPRESSION:  Today's treatment session focused on assessing current HEP exercise performance quality and addressing core  activation/lumbar stabilization deficits. Pt. Exhibited improvements with lumbar AROM following manual therapy interventions and was able to perform therapeutic exercises with NPS pain staying below 3/10. PT Educated patient about cues to maintain neutral pelvis and increasing abdominal musculature activation when performing dynamic activities, specifically with lifting mechanics. Pt. Has spinal injection scheduled for tomorrow and pt. Instructed to f/u with PT next week.  Pt. Will benefit from skilled PT interventions to address LE ROM, strength, and R lower quarter symptoms.   OBJECTIVE IMPAIRMENTS: decreased activity tolerance.   ACTIVITY LIMITATIONS: Bending, standing tolerance, decreased ROM, decreased strength  PARTICIPATION LIMITATIONS: laundry  PERSONAL FACTORS: Fitness, past/current experiences, social background, and 1 comorbidity: DM, are also affecting patient's functional outcome.   REHAB POTENTIAL: Good  CLINICAL  DECISION MAKING: Evolving/moderate complexity  EVALUATION COMPLEXITY: Moderate   GOALS: Goals reviewed with patient? No  SHORT TERM GOALS: Target date: 04/11/2023  Pt. Will be able to stand at their sink and wash dishes for 10 minutes continuously without episodes of severe LBP to increase participation in ADLs/IADLs. Baseline: Cannot stand longer than 4-5 minutes Goal status: On-going  2.  Pt. Will be able to demonstrate proper shoulder posture to correct their current forward head and rounded shoulder resting posture. Baseline:  Goal status: Partially met  LONG TERM GOALS: Target date: 05/23/2023  Pt. Will decrease modified Oswestry score from 52% self perceived disability to <40% self perceived disability. Baseline: 52% Goal status: INITIAL  2.  Pt. Will improve 5 time sit to stand time from 26.7 to 20 seconds to demonstrate improved LE strength and motor coordination. Baseline:  Goal status: INITIAL  3.  Pt. Will be able to walk 0.10 miles on treadmill at 8% incline to demonstrate increased participation with community ambulation. Baseline: 20 ft. Goal status: INITIAL  4.  Pt. Will be able to increase hip extension ROM to 15 degrees bilaterally to demonstrate improved gait and LE motor coordination.  Baseline: 7-8 degrees bilaterally Goal status: INITIAL   PLAN:  PT FREQUENCY: 1-2x/week  PT DURATION: 8 weeks  PLANNED INTERVENTIONS: 97110-Therapeutic exercises, 97530- Therapeutic activity, [id number]- Neuromuscular re-education, 97535- Self Care, 16109- Manual therapy, and 97116- Gait training.  PLAN FOR NEXT SESSION: Revise current HEP/ discuss injection   Cammie Mcgee, PT, DPT # [phone] 05/02/2023, 6:30 PM

## 2023-05-03 ENCOUNTER — Ambulatory Visit
Payer: Medicaid Other | Attending: Student in an Organized Health Care Education/Training Program | Admitting: Student in an Organized Health Care Education/Training Program

## 2023-05-03 ENCOUNTER — Encounter: Payer: Self-pay | Admitting: Student in an Organized Health Care Education/Training Program

## 2023-05-03 ENCOUNTER — Ambulatory Visit
Admission: RE | Admit: 2023-05-03 | Discharge: 2023-05-03 | Disposition: A | Discharge status: Home / Self Care | Source: Ambulatory Visit | Attending: Student in an Organized Health Care Education/Training Program | Admitting: Student in an Organized Health Care Education/Training Program

## 2023-05-03 DIAGNOSIS — M47816 Spondylosis without myelopathy or radiculopathy, lumbar region: Secondary | ICD-10-CM | POA: Diagnosis not present

## 2023-05-03 MED ORDER — DEXAMETHASONE SODIUM PHOSPHATE 10 MG/ML IJ SOLN
20.0000 mg | Freq: Once | INTRAMUSCULAR | Status: AC
  Administered 2023-05-03: 20 mg
  Filled 2023-05-03: qty 2

## 2023-05-03 MED ORDER — LACTATED RINGERS IV SOLN: Freq: Once | INTRAVENOUS | Status: AC

## 2023-05-03 MED ORDER — ROPIVACAINE HCL 2 MG/ML IJ SOLN
18.0000 mL | Freq: Once | INTRAMUSCULAR | Status: AC
  Administered 2023-05-03: 18 mL via PERINEURAL
  Filled 2023-05-03: qty 20

## 2023-05-03 MED ORDER — MIDAZOLAM HCL 2 MG/2ML IJ SOLN
0.5000 mg | Freq: Once | INTRAMUSCULAR | Status: AC
  Administered 2023-05-03: 2 mg via INTRAVENOUS
  Filled 2023-05-03: qty 2

## 2023-05-03 MED ORDER — LIDOCAINE HCL 2 % IJ SOLN
20.0000 mL | Freq: Once | INTRAMUSCULAR | Status: AC
  Administered 2023-05-03: 400 mg
  Filled 2023-05-03: qty 40

## 2023-05-03 NOTE — Progress Notes (Signed)
 PROVIDER NOTE: Interpretation of information contained herein should be left to medically-trained personnel. Specific patient instructions are provided elsewhere under "Patient Instructions" section of medical record. This document was created in part using STT-dictation technology, any transcriptional errors that may result from this process are unintentional.  Patient: Claudia Mills Type: Established DOB: 08/25/1970 MRN: [id number] PCP: Emogene Morgan, MD  Service: Procedure DOS: 05/03/2023 Setting: Ambulatory Location: Ambulatory outpatient facility Delivery: Face-to-face Provider: Edward Jolly, MD Specialty: Interventional Pain Management Specialty designation: 09 Location: Outpatient facility Ref. Prov.: Edward Jolly, MD       Interventional Therapy   Type: Lumbar Facet, Medial Branch Block(s) (w/ fluoroscopic mapping) #2  Laterality: Bilateral  Level: L3, L4, and L5 Medial Branch Level(s). Injecting these levels blocks the L3-4 and L4-5 lumbar facet joints. Imaging: Fluoroscopic guidance         Anesthesia: Local anesthesia (1-2% Lidocaine Sedation: Minimal Sedation                       DOS: 05/03/2023 Performed by: Edward Jolly, MD  Primary Purpose: Diagnostic/Therapeutic Indications: Low back pain severe enough to impact quality of life or function. 1. Lumbar spondylosis   2. Lumbar facet arthropathy    NAS-11 Pain score:   Pre-procedure: 4 /10   Post-procedure: 0-No pain/10     Position / Prep / Materials:  Position: Prone  Prep solution: ChloraPrep (2% chlorhexidine gluconate and 70% isopropyl alcohol) Area Prepped: Posterolateral Lumbosacral Spine (Wide prep: From the lower border of the scapula down to the end of the tailbone and from flank to flank.)  Materials:  Tray: Block Needle(s):  Type: Spinal  Gauge (G): 22  Length: 3.5-in Qty: 2      H&P (Pre-op Assessment):  Claudia Mills is a 53 y.o. (year old), female patient, seen today for interventional treatment.  She  has a past surgical history that includes Coronary angioplasty with stent; IR Radiologist Eval & Mgmt (09/20/2022); IR KYPHO LUMBAR INC FX REDUCE BONE BX UNI/BIL CANNULATION INC/IMAGING (10/06/2022); IR Radiologist Eval & Mgmt (10/13/2022); and IR Radiologist Eval & Mgmt (10/20/2022). Claudia Mills has a current medication list which includes the following prescription(s): alendronate, aspirin ec, brilinta, cyclobenzaprine, duloxetine, empagliflozin, gabapentin, ibuprofen, lisinopril, nitroglycerin, ondansetron, pantoprazole, glipizide, and metoprolol succinate. Her primarily concern today is the Back Pain  Initial Vital Signs:  Pulse/HCG Rate: 99ECG Heart Rate: 98 Temp: (!) 97.2 F (36.2 C) Resp: 16 BP: (!) 162/86 SpO2: 95 %  BMI: Estimated body mass index is 22.85 kg/m as calculated from the following:   Height as of this encounter: 5\' 3"  (1.6 m).   Weight as of this encounter: 129 lb (58.5 kg).  Risk Assessment: Allergies: Reviewed. She is allergic to bee venom.  Allergy Precautions: None required Coagulopathies: Reviewed. None identified.  Blood-thinner therapy: None at this time Active Infection(s): Reviewed. None identified. Claudia Mills is afebrile  Site Confirmation: Claudia Mills was asked to confirm the procedure and laterality before marking the site Procedure checklist: Completed Consent: Before the procedure and under the influence of no sedative(s), amnesic(s), or anxiolytics, the patient was informed of the treatment options, risks and possible complications. To fulfill our ethical and legal obligations, as recommended by the American Medical Association's Code of Ethics, I have informed the patient of my clinical impression; the nature and purpose of the treatment or procedure; the risks, benefits, and possible complications of the intervention; the alternatives, including doing nothing; the risk(s) and benefit(s) of the alternative treatment(s) or  procedure(s); and the risk(s) and  benefit(s) of doing nothing. The patient was provided information about the general risks and possible complications associated with the procedure. These may include, but are not limited to: failure to achieve desired goals, infection, bleeding, organ or nerve damage, allergic reactions, paralysis, and death. In addition, the patient was informed of those risks and complications associated to Spine-related procedures, such as failure to decrease pain; infection (i.e.: Meningitis, epidural or intraspinal abscess); bleeding (i.e.: epidural hematoma, subarachnoid hemorrhage, or any other type of intraspinal or peri-dural bleeding); organ or nerve damage (i.e.: Any type of peripheral nerve, nerve root, or spinal cord injury) with subsequent damage to sensory, motor, and/or autonomic systems, resulting in permanent pain, numbness, and/or weakness of one or several areas of the body; allergic reactions; (i.e.: anaphylactic reaction); and/or death. Furthermore, the patient was informed of those risks and complications associated with the medications. These include, but are not limited to: allergic reactions (i.e.: anaphylactic or anaphylactoid reaction(s)); adrenal axis suppression; blood sugar elevation that in diabetics may result in ketoacidosis or comma; water retention that in patients with history of congestive heart failure may result in shortness of breath, pulmonary edema, and decompensation with resultant heart failure; weight gain; swelling or edema; medication-induced neural toxicity; particulate matter embolism and blood vessel occlusion with resultant organ, and/or nervous system infarction; and/or aseptic necrosis of one or more joints. Finally, the patient was informed that Medicine is not an exact science; therefore, there is also the possibility of unforeseen or unpredictable risks and/or possible complications that may result in a catastrophic outcome. The patient indicated having understood very  clearly. We have given the patient no guarantees and we have made no promises. Enough time was given to the patient to ask questions, all of which were answered to the patient's satisfaction. Claudia Mills has indicated that she wanted to continue with the procedure. Attestation: I, the ordering provider, attest that I have discussed with the patient the benefits, risks, side-effects, alternatives, likelihood of achieving goals, and potential problems during recovery for the procedure that I have provided informed consent. Date  Time: 05/03/2023 12:32 PM   Pre-Procedure Preparation:  Monitoring: As per clinic protocol. Respiration, ETCO2, SpO2, BP, heart rate and rhythm monitor placed and checked for adequate function Safety Precautions: Patient was assessed for positional comfort and pressure points before starting the procedure. Time-out: I initiated and conducted the "Time-out" before starting the procedure, as per protocol. The patient was asked to participate by confirming the accuracy of the "Time Out" information. Verification of the correct person, site, and procedure were performed and confirmed by me, the nursing staff, and the patient. "Time-out" conducted as per Joint Commission's Universal Protocol (UP.01.01.01). Time: 1323 Start Time: 1323 hrs.  Description of Procedure:          Laterality: (see above) Targeted Levels: (see above)  Safety Precautions: Aspiration looking for blood return was conducted prior to all injections. At no point did we inject any substances, as a needle was being advanced. Before injecting, the patient was told to immediately notify me if she was experiencing any new onset of "ringing in the ears, or metallic taste in the mouth". No attempts were made at seeking any paresthesias. Safe injection practices and needle disposal techniques used. Medications properly checked for expiration dates. SDV (single dose vial) medications used. After the completion of the  procedure, all disposable equipment used was discarded in the proper designated medical waste containers. Local Anesthesia: Protocol guidelines were followed. The  patient was positioned over the fluoroscopy table. The area was prepped in the usual manner. The time-out was completed. The target area was identified using fluoroscopy. A 12-in long, straight, sterile hemostat was used with fluoroscopic guidance to locate the targets for each level blocked. Once located, the skin was marked with an approved surgical skin marker. Once all sites were marked, the skin (epidermis, dermis, and hypodermis), as well as deeper tissues (fat, connective tissue and muscle) were infiltrated with a small amount of a short-acting local anesthetic, loaded on a 10cc syringe with a 25G, 1.5-in  Needle. An appropriate amount of time was allowed for local anesthetics to take effect before proceeding to the next step. Local Anesthetic: Lidocaine 2.0% The unused portion of the local anesthetic was discarded in the proper designated containers. Technical description of process:   L3 Medial Branch Nerve Block (MBB): The target area for the L3 medial branch is at the junction of the postero-lateral aspect of the superior articular process and the superior, posterior, and medial edge of the transverse process of L4. Under fluoroscopic guidance, a Quincke needle was inserted until contact was made with os over the superior postero-lateral aspect of the pedicular shadow (target area). After negative aspiration for blood, 2 mL of the nerve block solution was injected without difficulty or complication. The needle was removed intact. L4 Medial Branch Nerve Block (MBB): The target area for the L4 medial branch is at the junction of the postero-lateral aspect of the superior articular process and the superior, posterior, and medial edge of the transverse process of L5. Under fluoroscopic guidance, a Quincke needle was inserted until contact was  made with os over the superior postero-lateral aspect of the pedicular shadow (target area). After negative aspiration for blood, 2mL of the nerve block solution was injected without difficulty or complication. The needle was removed intact. L5 Medial Branch Nerve Block (MBB): The target area for the L5 medial branch is at the junction of the postero-lateral aspect of the superior articular process and the superior, posterior, and medial edge of the sacral ala. Under fluoroscopic guidance, a Quincke needle was inserted until contact was made with os over the superior postero-lateral aspect of the pedicular shadow (target area). After negative aspiration for blood, 2mL of the nerve block solution was injected without difficulty or complication. The needle was removed intact.   Once the entire procedure was completed, the treated area was cleaned, making sure to leave some of the prepping solution back to take advantage of its long term bactericidal properties.         Illustration of the posterior view of the lumbar spine and the posterior neural structures. Laminae of L2 through S1 are labeled. DPRL5, dorsal primary ramus of L5; DPRS1, dorsal primary ramus of S1; DPR3, dorsal primary ramus of L3; FJ, facet (zygapophyseal) joint L3-L4; I, inferior articular process of L4; LB1, lateral branch of dorsal primary ramus of L1; IAB, inferior articular branches from L3 medial branch (supplies L4-L5 facet joint); IBP, intermediate branch plexus; MB3, medial branch of dorsal primary ramus of L3; NR3, third lumbar nerve root; S, superior articular process of L5; SAB, superior articular branches from L4 (supplies L4-5 facet joint also); TP3, transverse process of L3.   Facet Joint Innervation (* possible contribution)  L1-2 T12, L1 (L2*)  Medial Branch  L2-3 L1, L2 (L3*)         "          "  L3-4 L2, L3 (L4*)         "          "  L4-5 L3, L4 (L5*)         "          "  L5-S1 L4, L5, S1          "          "     Vitals:   05/03/23 1320 05/03/23 1325 05/03/23 1329 05/03/23 1337  BP: (!) 185/96 (!) 156/92 (!) 163/90 (!) 166/88  Pulse:      Resp: 17 18 17 18   Temp:    (!) 97 F (36.1 C)  TempSrc:    Temporal  SpO2: 95% 98% 100% 100%  Weight:      Height:         End Time: 1329 hrs.  Imaging Guidance (Spinal):          Type of Imaging Technique: Fluoroscopy Guidance (Spinal) Indication(s): Fluoroscopy guidance for needle placement to enhance accuracy in procedures requiring precise needle localization for targeted delivery of medication in or near specific anatomical locations not easily accessible without such real-time imaging assistance. Exposure Time: Please see nurses notes. Contrast: None used. Fluoroscopic Guidance: I was personally present during the use of fluoroscopy. "Tunnel Vision Technique" used to obtain the best possible view of the target area. Parallax error corrected before commencing the procedure. "Direction-depth-direction" technique used to introduce the needle under continuous pulsed fluoroscopy. Once target was reached, antero-posterior, oblique, and lateral fluoroscopic projection used confirm needle placement in all planes. Images permanently stored in EMR. Interpretation: No contrast injected. I personally interpreted the imaging intraoperatively. Adequate needle placement confirmed in multiple planes. Permanent images saved into the patient's record.  Post-operative Assessment:  Post-procedure Vital Signs:  Pulse/HCG Rate: 9999 Temp: (!) 97 F (36.1 C) Resp: 18 BP: (!) 166/88 SpO2: 100 %  EBL: None  Complications: No immediate post-treatment complications observed by team, or reported by patient.  Note: The patient tolerated the entire procedure well. A repeat set of vitals were taken after the procedure and the patient was kept under observation following institutional policy, for this type of procedure. Post-procedural neurological assessment was performed,  showing return to baseline, prior to discharge. The patient was provided with post-procedure discharge instructions, including a section on how to identify potential problems. Should any problems arise concerning this procedure, the patient was given instructions to immediately contact us, at any time, without hesitation. In any case, we plan to contact the patient by telephone for a follow-up status report regarding this interventional procedure.  Comments:  No additional relevant information.  Plan of Care (POC)  Orders:  Orders Placed This Encounter  Procedures   DG PAIN [facility] C-ARM 1-60 MIN NO REPORT    Intraoperative interpretation by procedural physician at [facility].    Standing Status:   Standing    Number of Occurrences:   1    Reason for exam::   Assistance in needle guidance and placement for procedures requiring needle placement in or near specific anatomical locations not easily accessible without such assistance.    Medications ordered for procedure: Meds ordered this encounter  Medications   lidocaine (XYLOCAINE) 2 % (with pres) injection 400 mg   lactated ringers infusion   midazolam (VERSED) injection 0.5-2 mg    Make sure Flumazenil is available in the pyxis when using this medication. If oversedation occurs, administer 0.2 mg IV over 15 sec. If after 45 sec no response, administer 0.2 mg again over 1 min; may repeat at 1 min intervals; not to exceed 4  doses (1 mg)   ropivacaine (PF) 2 mg/mL (0.2%) (NAROPIN) injection 18 mL   dexamethasone (DECADRON) injection 20 mg   Medications administered: We administered lidocaine, lactated ringers, midazolam, ropivacaine (PF) 2 mg/mL (0.2%), and dexamethasone.  See the medical record for exact dosing, route, and time of administration.  Follow-up plan:   Return in about 27 days (around 05/30/2023) for PPE, F2F (s/p BLF #2).       BLF L3-5 01/18/23, 05/03/2023    Recent Visits Date Type Provider Dept  03/30/23  Office Visit Edward Jolly, MD [facility]  Showing recent visits within past 90 days and meeting all other requirements Today's Visits Date Type Provider Dept  05/03/23 Procedure visit Edward Jolly, MD [facility]  Showing today's visits and meeting all other requirements Future Appointments Date Type Provider Dept  05/31/23 Appointment Edward Jolly, MD [facility]  Showing future appointments within next 90 days and meeting all other requirements  Disposition: Discharge home  Discharge (Date  Time): 05/03/2023; 1344 hrs.   Primary Care Physician: Emogene Morgan, MD Location: [facility] Note by: Edward Jolly, MD (TTS technology used. I apologize for any typographical errors that were not detected and corrected.) Date: 05/03/2023; Time: 3:18 PM  Disclaimer:  Medicine is not an Visual merchandiser. The only guarantee in medicine is that nothing is guaranteed. It is important to note that the decision to proceed with this intervention was based on the information collected from the patient. The Data and conclusions were drawn from the patient's questionnaire, the interview, and the physical examination. Because the information was provided in large part by the patient, it cannot be guaranteed that it has not been purposely or unconsciously manipulated. Every effort has been made to obtain as much relevant data as possible for this evaluation. It is important to note that the conclusions that lead to this procedure are derived in large part from the available data. Always take into account that the treatment will also be dependent on availability of resources and existing treatment guidelines, considered by other Pain Management Practitioners as being common knowledge and practice, at the time of the intervention. For Medico-Legal purposes, it is also important to point out that variation in procedural techniques and pharmacological choices are  the acceptable norm. The indications, contraindications, technique, and results of the above procedure should only be interpreted and judged by a Board-Certified Interventional Pain Specialist with extensive familiarity and expertise in the same exact procedure and technique.

## 2023-05-03 NOTE — Patient Instructions (Signed)

## 2023-05-03 NOTE — Progress Notes (Signed)
 Safety precautions to be maintained throughout the outpatient stay will include: orient to surroundings, keep bed in low position, maintain call bell within reach at all times, provide assistance with transfer out of bed and ambulation.

## 2023-05-04 ENCOUNTER — Telehealth: Payer: Self-pay | Admitting: *Deleted

## 2023-05-04 ENCOUNTER — Ambulatory Visit: Payer: Medicaid Other | Admitting: Physical Therapy

## 2023-05-04 NOTE — Telephone Encounter (Signed)
 Post procedure call; voicemail left

## 2023-05-09 ENCOUNTER — Encounter: Payer: Medicaid Other | Admitting: Physical Therapy

## 2023-05-11 ENCOUNTER — Ambulatory Visit: Payer: Medicaid Other | Admitting: Physical Therapy

## 2023-05-17 ENCOUNTER — Ambulatory Visit: Admitting: Physical Therapy

## 2023-05-24 NOTE — Progress Notes (Signed)
 Referring Physician:  No referring provider defined for this encounter.  Primary Physician:  Claudia Roosevelt, MD  History of Present Illness: [name] has a history of HTN, CAD, and DM. Recent diagnosis of vertigo.   She had L1 kyphoplasty on 10/06/22. She was doing well after her procedure with no pain until a week later when she tripped over her cat on 10/11/22.   Last seen by me on 02/16/23 for follow up. She has known lumbar spondylosis with DDD, moderate central stenosis L4-L5 with left lateral recess stenosis and mild/moderate foraminal stenosis on left at L5-S1.    LBP is likely due to underlying spondylosis.   She has short term improvement with lumbar MBB. PT was ordered at her last visit. She was given note keeping her out of work.   She had repeat MBB L3-L5 by Dr. Rhesa Celeste on 05/03/23.   She declined PT. She was given flexeril and referred to pain management. She was given note to keep her out of work until her follow up.   Dr. Rhesa Celeste sent her to PT and she had bilateral L3, L4, L5 MBB on 05/03/23. He also prescribed flexeril for her.   She had good relief after her MBB for about a week or so. Pain is back to her baseline. She is back to her constant LBP with no leg pain. Pain is worse with bending, lifting, prolonged walking.   She has known neuropathy with intermittent numbness in right leg in the calf and intermittent cramping in left leg. Numbness is worse with prolonged walking.   She is taking neurontin. She has not been taking flexeril.   Bowel/Bladder Dysfunction: none  Conservative measures:  Physical therapy: nothing recent Multimodal medical therapy including regular antiinflammatories: percocet   Injections:  bilateral L3, L4, L5 MBB on 05/03/23 bilateral L3, L4, L5 MBB on 01/18/23  Past Surgery: L1 kyphoplasty on 10/06/22  Claudia Mills has no symptoms of cervical myelopathy.  The symptoms are causing a significant impact on the patient's life.    Review of Systems:  A 10 point review of systems is negative, except for the pertinent positives and negatives detailed in the HPI.  Past Medical History: Past Medical History:  Diagnosis Date   Diabetes mellitus without complication (HCC)    Hypertension    MI (myocardial infarction) (HCC)     Past Surgical History: Past Surgical History:  Procedure Laterality Date   CORONARY ANGIOPLASTY WITH STENT PLACEMENT     IR KYPHO LUMBAR INC FX REDUCE BONE BX UNI/BIL CANNULATION INC/IMAGING  10/06/2022   IR RADIOLOGIST EVAL & MGMT  09/20/2022   IR RADIOLOGIST EVAL & MGMT  10/13/2022   IR RADIOLOGIST EVAL & MGMT  10/20/2022    Allergies: Allergies as of 05/31/2023 - Review Complete 05/31/2023  Allergen Reaction Noted   Bee venom Anaphylaxis 04/07/2021    Medications: Outpatient Encounter Medications as of 05/31/2023  Medication Sig   aspirin EC 81 MG tablet Take 81 mg by mouth daily. Swallow whole.   BRILINTA 90 MG TABS tablet Take 90 mg by mouth 2 (two) times daily.   cyclobenzaprine (FLEXERIL) 5 MG tablet Take 1 tablet (5 mg total) by mouth 3 (three) times daily as needed for muscle spasms.   DULoxetine (CYMBALTA) 30 MG capsule Take 30 mg by mouth daily.   Empagliflozin (JARDIANCE PO) Take 25 mg by mouth daily.   gabapentin (NEURONTIN) 300 MG capsule Take 300 mg by mouth 3 (three) times daily.  glipiZIDE (GLUCOTROL) 5 MG tablet Take 1 tablet (5 mg total) by mouth daily before breakfast.   lisinopril (ZESTRIL) 20 MG tablet Take 1 tablet (20 mg total) by mouth daily.   pantoprazole (PROTONIX) 40 MG tablet Take 1 tablet (40 mg total) by mouth daily.   nitroGLYCERIN (NITROSTAT) 0.3 MG SL tablet Place 0.3 mg under the tongue every 5 (five) minutes as needed for chest pain. (Patient not taking: Reported on 05/31/2023)   ondansetron (ZOFRAN-ODT) 4 MG disintegrating tablet Take 1 tablet (4 mg total) by mouth every 8 (eight) hours as needed for nausea or vomiting. (Patient not taking: Reported on  05/31/2023)   [DISCONTINUED] alendronate (FOSAMAX) 70 MG tablet Take 70 mg by mouth once a week.   [DISCONTINUED] ibuprofen (ADVIL) 800 MG tablet Take 800 mg by mouth 3 (three) times daily. (Patient not taking: Reported on 05/31/2023)   [DISCONTINUED] metoprolol succinate (TOPROL XL) 100 MG 24 hr tablet Take 1 tablet (100 mg total) by mouth daily. Take with or immediately following a meal.   No facility-administered encounter medications on file as of 05/31/2023.    Social History: Social History   Tobacco Use   Smoking status: Never   Smokeless tobacco: Never  Vaping Use   Vaping status: Never Used  Substance Use Topics   Alcohol use: Not Currently    Alcohol/week: 12.0 standard drinks of alcohol    Types: 12 Cans of beer per week   Drug use: Yes    Types: Marijuana    Family Medical History: History reviewed. No pertinent family history.  Physical Examination: Vitals:   05/31/23 0938 05/31/23 1002  BP: (!) 172/74 (!) 170/84       Awake, alert, oriented to person, place, and time.  Speech is clear and fluent. Fund of knowledge is appropriate.   Cranial Nerves: Pupils equal round and reactive to light.  Facial tone is symmetric.    She has diffuse mid to lower lumbar tenderness.   No abnormal lesions on exposed skin.   Strength: Side Iliopsoas Quads Hamstring PF DF EHL  R 5 5 5 5 5 5   L 5 5 5 5 5 5    Clonus is not present.   Bilateral lower extremity sensation is intact to light touch, slightly diminished in right leg.   Patella and achilles reflex is 2+ and symmetric bilaterally. No pain with IR/ER of both hips.   Gait is normal.     Medical Decision Making  Imaging: none  Assessment and Plan: Claudia Mills is a pleasant 53 y.o. female who had L1 kyphoplasty on 10/06/22. She was doing well after her procedure with no pain until a week later when she tripped over her cat on 10/11/22.   She had good relief after her second MBB for about a week or so. She is back  to her constant LBP with no leg pain.   She has known neuropathy with new intermittent numbness in right leg in the calf and intermittent cramping in left leg. Numbness is worse with prolonged walking. She never had an EMG.   Previous MRI showed lumbar spondylosis with DDD, moderate central stenosis L4-L5 with left lateral recess stenosis and mild/moderate foraminal stenosis on left at L5-S1.   Numbness in right leg is more suspicious for neuropathy, but spinal stenosis could be contributing.   Treatment options discussed with patient and following plan made:   - Follow up with Dr. Rhesa Celeste as scheduled to discuss further RFA.  - Continue with PT  for her lumbar spine.  - EMG/NCS of bilateral lower extremities to evaluate numbness in right leg. Orders to [facility].  - She does bending, twisting, and lifting at work (packs socks). Note keeping her out until follow up with me in 6 weeks. She will likely need FCE but will hold until after RFA.  - Will likely set up phone visit to review her EMG results.   BP was elevated. She has not taken her BP meds yet. No symptoms of chest pain, shortness of breath, blurry vision, or headaches. She will take her medications and recheck her BP at home. She will call PCP with any issues. If she develops CP, SOB, blurry vision, or headaches, then she will go to ED.     I spent a total of 20 minutes in face-to-face and non-face-to-face activities related to this patient's care today including review of outside records, review of imaging, review of symptoms, physical exam, discussion of differential diagnosis, discussion of treatment options, and documentation.   Claudia Russel PA-C Dept. of Neurosurgery

## 2023-05-31 ENCOUNTER — Ambulatory Visit
Attending: Student in an Organized Health Care Education/Training Program | Admitting: Student in an Organized Health Care Education/Training Program

## 2023-05-31 ENCOUNTER — Ambulatory Visit (INDEPENDENT_AMBULATORY_CARE_PROVIDER_SITE_OTHER): Payer: Medicaid Other | Admitting: Orthopedic Surgery

## 2023-05-31 ENCOUNTER — Encounter: Payer: Self-pay | Admitting: Orthopedic Surgery

## 2023-05-31 ENCOUNTER — Encounter: Payer: Self-pay | Admitting: Student in an Organized Health Care Education/Training Program

## 2023-05-31 VITALS — BP 163/73 | HR 118 | Temp 97.5°F | Resp 16 | Ht 63.0 in | Wt 125.0 lb

## 2023-05-31 VITALS — BP 170/84 | Ht 63.0 in | Wt 129.0 lb

## 2023-05-31 DIAGNOSIS — G894 Chronic pain syndrome: Secondary | ICD-10-CM | POA: Diagnosis not present

## 2023-05-31 DIAGNOSIS — M5136 Other intervertebral disc degeneration, lumbar region with discogenic back pain only: Secondary | ICD-10-CM | POA: Diagnosis not present

## 2023-05-31 DIAGNOSIS — M48062 Spinal stenosis, lumbar region with neurogenic claudication: Secondary | ICD-10-CM | POA: Diagnosis not present

## 2023-05-31 DIAGNOSIS — G8929 Other chronic pain: Secondary | ICD-10-CM | POA: Insufficient documentation

## 2023-05-31 DIAGNOSIS — M545 Low back pain, unspecified: Secondary | ICD-10-CM

## 2023-05-31 DIAGNOSIS — S32010S Wedge compression fracture of first lumbar vertebra, sequela: Secondary | ICD-10-CM

## 2023-05-31 DIAGNOSIS — R200 Anesthesia of skin: Secondary | ICD-10-CM

## 2023-05-31 DIAGNOSIS — M47816 Spondylosis without myelopathy or radiculopathy, lumbar region: Secondary | ICD-10-CM | POA: Diagnosis not present

## 2023-05-31 NOTE — Patient Instructions (Signed)
 It was so nice to see you today. Thank you so much for coming in.    Follow up with Dr. Rhesa Celeste as scheduled.   Continue with PT for your lower back.   I want to get an EMG (nerve conduction test) to look into things further. I have ordered this and [facility] will call you to schedule. You can also call them at [phone].   You have a follow up with me in 6 weeks. We will do a phone visit to review the nerve test findings after I get them back.   Your blood pressure was elevated today. Take you medication and recheck it at home and follow up with your PCP if it remains high. If you have any chest pain, shortness of breath, blurry vision, or headaches then you need to go to ED.    Please do not hesitate to call if you have any questions or concerns. You can also message me in MyChart.   Lucetta Russel PA-C [phone]     The physicians and staff at [facility] at [facility] are committed to providing excellent care. You may receive a survey asking for feedback about your experience at our office. We value you your feedback and appreciate you taking the time to to fill it out. The [facility] leadership team is also available to discuss your experience in person, feel free to contact us  [phone].

## 2023-05-31 NOTE — Patient Instructions (Signed)
 ______________________________________________________________________    Preparing for your procedure  Appointments: If you think you may not be able to keep your appointment, call 24-48 hours in advance to cancel. We need time to make it available to others.  Procedure visits are for procedures only. During your procedure appointment there will be: NO Prescription Refills*. NO medication changes or discussions*. NO discussion of disability issues*. NO unrelated pain problem evaluations*. NO evaluations to order other pain procedures*. *These will be addressed at a separate and distinct evaluation encounter on the provider's evaluation schedule and not during procedure days.  Instructions: Food intake: Avoid eating anything solid for at least 8 hours prior to your procedure. Clear liquid intake: You may take clear liquids such as water up to 2 hours prior to your procedure. (No carbonated drinks. No soda.) Transportation: Unless otherwise stated by your physician, bring a driver. (Driver cannot be a Market researcher, Pharmacist, community, or any other form of public transportation.) Morning Medicines: Except for blood thinners, take all of your other morning medications with a sip of water. Make sure to take your heart and blood pressure medicines. If your blood pressure's lower number is above 100, the case will be rescheduled. Blood thinners: Make sure to stop your blood thinners as instructed.  If you take a blood thinner, but were not instructed to stop it, call our office [phone] and ask to talk to a nurse. Not stopping a blood thinner prior to certain procedures could lead to serious complications. Diabetics on insulin: Notify the staff so that you can be scheduled 1st case in the morning. If your diabetes requires high dose insulin, take only  of your normal insulin dose the morning of the procedure and notify the staff that you have done so. Preventing infections: Shower with an antibacterial soap the  morning of your procedure.  Build-up your immune system: Take 1000 mg of Vitamin C with every meal (3 times a day) the day prior to your procedure. Antibiotics: Inform the nursing staff if you are taking any antibiotics or if you have any conditions that may require antibiotics prior to procedures. (Example: recent joint implants)   Pregnancy: If you are pregnant make sure to notify the nursing staff. Not doing so may result in injury to the fetus, including death.  Sickness: If you have a cold, fever, or any active infections, call and cancel or reschedule your procedure. Receiving steroids while having an infection may result in complications. Arrival: You must be in the facility at least 30 minutes prior to your scheduled procedure. Tardiness: Your scheduled time is also the cutoff time. If you do not arrive at least 15 minutes prior to your procedure, you will be rescheduled.  Children: Do not bring any children with you. Make arrangements to keep them home. Dress appropriately: There is always a possibility that your clothing may get soiled. Avoid long dresses. Valuables: Do not bring any jewelry or valuables.  Reasons to call and reschedule or cancel your procedure: (Following these recommendations will minimize the risk of a serious complication.) Surgeries: Avoid having procedures within 2 weeks of any surgery. (Avoid for 2 weeks before or after any surgery). Flu Shots: Avoid having procedures within 2 weeks of a flu shots or . (Avoid for 2 weeks before or after immunizations). Barium: Avoid having a procedure within 7-10 days after having had a radiological study involving the use of radiological contrast. (Myelograms, Barium swallow or enema study). Heart attacks: Avoid any elective procedures or surgeries for the  initial 6 months after a "Myocardial Infarction" (Heart Attack). Blood thinners: It is imperative that you stop these medications before procedures. Let us know if you if you take  any blood thinner.  Infection: Avoid procedures during or within two weeks of an infection (including chest colds or gastrointestinal problems). Symptoms associated with infections include: Localized redness, fever, chills, night sweats or profuse sweating, burning sensation when voiding, cough, congestion, stuffiness, runny nose, sore throat, diarrhea, nausea, vomiting, cold or Flu symptoms, recent or current infections. It is specially important if the infection is over the area that we intend to treat. Heart and lung problems: Symptoms that may suggest an active cardiopulmonary problem include: cough, chest pain, breathing difficulties or shortness of breath, dizziness, ankle swelling, uncontrolled high or unusually low blood pressure, and/or palpitations. If you are experiencing any of these symptoms, cancel your procedure and contact your primary care physician for an evaluation.  Remember:  Regular Business hours are:  Monday to Thursday 8:00 AM to 4:00 PM  Provider's Schedule: Delano Metz, MD:  Procedure days: Tuesday and Thursday 7:30 AM to 4:00 PM  Edward Jolly, MD:  Procedure days: Monday and Wednesday 7:30 AM to 4:00 PM Last  Updated: 01/24/2023 ______________________________________________________________________      ______________________________________________________________________    General Risks and Possible Complications  Patient Responsibilities: It is important that you read this as it is part of your informed consent. It is our duty to inform you of the risks and possible complications associated with treatments offered to you. It is your responsibility as a patient to read this and to ask questions about anything that is not clear or that you believe was not covered in this document.  Patient's Rights: You have the right to refuse treatment. You also have the right to change your mind, even after initially having agreed to have the treatment done. However,  under this last option, if you wait until the last second to change your mind, you may be charged for the materials used up to that point.  Introduction: Medicine is not an Visual merchandiser. Everything in Medicine, including the lack of treatment(s), carries the potential for danger, harm, or loss (which is by definition: Risk). In Medicine, a complication is a secondary problem, condition, or disease that can aggravate an already existing one. All treatments carry the risk of possible complications. The fact that a side effects or complications occurs, does not imply that the treatment was conducted incorrectly. It must be clearly understood that these can happen even when everything is done following the highest safety standards.  No treatment: You can choose not to proceed with the proposed treatment alternative. The "PRO(s)" would include: avoiding the risk of complications associated with the therapy. The "CON(s)" would include: not getting any of the treatment benefits. These benefits fall under one of three categories: diagnostic; therapeutic; and/or palliative. Diagnostic benefits include: getting information which can ultimately lead to improvement of the disease or symptom(s). Therapeutic benefits are those associated with the successful treatment of the disease. Finally, palliative benefits are those related to the decrease of the primary symptoms, without necessarily curing the condition (example: decreasing the pain from a flare-up of a chronic condition, such as incurable terminal cancer).  General Risks and Complications: These are associated to most interventional treatments. They can occur alone, or in combination. They fall under one of the following six (6) categories: no benefit or worsening of symptoms; bleeding; infection; nerve damage; allergic reactions; and/or death. No benefits or worsening  of symptoms: In Medicine there are no guarantees, only probabilities. No healthcare provider can  ever guarantee that a medical treatment will work, they can only state the probability that it may. Furthermore, there is always the possibility that the condition may worsen, either directly, or indirectly, as a consequence of the treatment. Bleeding: This is more common if the patient is taking a blood thinner, either prescription or over the counter (example: Goody Powders, Fish oil, Aspirin, Garlic, etc.), or if suffering a condition associated with impaired coagulation (example: Hemophilia, cirrhosis of the liver, low platelet counts, etc.). However, even if you do not have one on these, it can still happen. If you have any of these conditions, or take one of these drugs, make sure to notify your treating physician. Infection: This is more common in patients with a compromised immune system, either due to disease (example: diabetes, cancer, human immunodeficiency virus [HIV], etc.), or due to medications or treatments (example: therapies used to treat cancer and rheumatological diseases). However, even if you do not have one on these, it can still happen. If you have any of these conditions, or take one of these drugs, make sure to notify your treating physician. Nerve Damage: This is more common when the treatment is an invasive one, but it can also happen with the use of medications, such as those used in the treatment of cancer. The damage can occur to small secondary nerves, or to large primary ones, such as those in the spinal cord and brain. This damage may be temporary or permanent and it may lead to impairments that can range from temporary numbness to permanent paralysis and/or brain death. Allergic Reactions: Any time a substance or material comes in contact with our body, there is the possibility of an allergic reaction. These can range from a mild skin rash (contact dermatitis) to a severe systemic reaction (anaphylactic reaction), which can result in death. Death: In general, any medical  intervention can result in death, most of the time due to an unforeseen complication. ______________________________________________________________________     Radiofrequency Ablation Radiofrequency ablation is a procedure that is performed to relieve pain. The procedure is often used for back, neck, or arm pain. Radiofrequency ablation involves the use of a machine that creates radio waves to make heat. During the procedure, the heat is applied to the nerve that carries the pain signal. The heat damages the nerve and interferes with the pain signal. Pain relief usually starts about 2 weeks after the procedure and lasts for 6 months to 1 year. Tell a health care provider about: Any allergies you have. All medicines you are taking, including vitamins, herbs, eye drops, creams, and over-the-counter medicines. Any problems you or family members have had with anesthetic medicines. Any bleeding problems you have. Any surgeries you have had. Any medical conditions you have. Whether you are pregnant or may be pregnant. What are the risks? Generally, this is a safe procedure. However, problems may occur, including: Pain or soreness at the injection site. Allergic reaction to medicines given during the procedure. Bleeding. Infection at the injection site. Damage to nerves or blood vessels. What happens before the procedure? When to stop eating and drinking Follow instructions from your health care provider about what you may eat and drink before your procedure. These may include: 8 hours before the procedure Stop eating most foods. Do not eat meat, fried foods, or fatty foods. Eat only light foods, such as toast or crackers. All liquids are okay except  energy drinks and alcohol. 6 hours before the procedure Stop eating. Drink only clear liquids, such as water, clear fruit juice, black coffee, plain tea, and sports drinks. Do not drink energy drinks or alcohol. 2 hours before the  procedure Stop drinking all liquids. You may be allowed to take medicine with small sips of water. If you do not follow your health care provider's instructions, your procedure may be delayed or canceled. Medicines Ask your health care provider about: Changing or stopping your regular medicines. This is especially important if you are taking diabetes medicines or blood thinners. Taking medicines such as aspirin and ibuprofen. These medicines can thin your blood. Do not take these medicines unless your health care provider tells you to take them. Taking over-the-counter medicines, vitamins, herbs, and supplements. General instructions Ask your health care provider what steps will be taken to help prevent infection. These steps may include: Removing hair at the procedure site. Washing skin with a germ-killing soap. Taking antibiotic medicine. If you will be going home right after the procedure, plan to have a responsible adult: Take you home from the hospital or clinic. You will not be allowed to drive. Care for you for the time you are told. What happens during the procedure?  You will be awake during the procedure. You will need to be able to talk with the health care provider during the procedure. An IV will be inserted into one of your veins. You will be given one or more of the following: A medicine to help you relax (sedative). A medicine to numb the area (local anesthetic). Your health care provider will insert a radiofrequency needle into the area to be treated. This is done with the help of fluoroscopy. A wire that carries the radio waves (electrode) will be put through the radiofrequency needle. An electrical pulse will be sent through the electrode to verify the correct nerve that is causing your pain. You will feel a tingling sensation, and you may have muscle twitching. The tissue around the needle tip will be heated by an electric current that comes from the radiofrequency  machine. This will numb the nerves. The needle will be removed. A bandage (dressing) will be put on the insertion area. The procedure may vary among health care providers and hospitals. What happens after the procedure? Your blood pressure, heart rate, breathing rate, and blood oxygen level will be monitored until you leave the hospital or clinic. Return to your normal activities as told by your health care provider. Ask your health care provider what activities are safe for you. If you were given a sedative during the procedure, it can affect you for several hours. Do not drive or operate machinery until your health care provider says that it is safe. Summary Radiofrequency ablation is a procedure that is performed to relieve pain. The procedure is often used for back, neck, or arm pain. Radiofrequency ablation involves the use of a machine that creates radio waves to make heat. Plan to have a responsible adult take you home from the hospital or clinic. Do not drive or operate machinery until your health care provider says that it is safe. Return to your normal activities as told by your health care provider. Ask your health care provider what activities are safe for you. This information is not intended to replace advice given to you by your health care provider. Make sure you discuss any questions you have with your health care provider. Document Revised: 07/21/2020 Document Reviewed: 07/21/2020  Elsevier Patient Education  2024 [employer].

## 2023-05-31 NOTE — Progress Notes (Signed)
 Safety precautions to be maintained throughout the outpatient stay will include: orient to surroundings, keep bed in low position, maintain call bell within reach at all times, provide assistance with transfer out of bed and ambulation.

## 2023-05-31 NOTE — Progress Notes (Signed)
 PROVIDER NOTE: Interpretation of information contained herein should be left to medically-trained personnel. Specific patient instructions are provided elsewhere under "Patient Instructions" section of medical record. This document was created in part using AI and STT-dictation technology, any transcriptional errors that may result from this process are unintentional.  Patient: Claudia Mills  Service: E/M   PCP: Lorina Roosevelt, MD  DOB: Sep 16, 1970  DOS: 05/31/2023  Provider: Cephus Collin, MD  MRN: [id number]  Delivery: Face-to-face  Specialty: Interventional Pain Management  Type: Established Patient  Setting: Ambulatory outpatient facility  Specialty designation: 09  Referring Prov.: Lorina Roosevelt, MD  Location: Outpatient office facility       HPI  [name], a 53 y.o. year old female, is here today because of her Lumbar spondylosis [M47.816]. Ms. Mkrtchyan primary complain today is Back Pain and Leg Pain (Right, cramping)  Pertinent problems: Ms. Vanwagner has Lumbar facet arthropathy and Chronic bilateral low back pain without sciatica on their pertinent problem list. Pain Assessment: Severity of Chronic pain is reported as a 6.5/10. Location: Back Lower/denies. Onset: More than a month ago. Quality: Burning (depending on activity). Timing: Constant. Modifying factor(s): procedures. Vitals:  height is 5\' 3"  (1.6 m) and weight is 125 lb (56.7 kg). Her temperature is 97.5 F (36.4 C) (abnormal). Her blood pressure is 163/73 (abnormal) and her pulse is 118 (abnormal). Her respiration is 16 and oxygen saturation is 100%.  BMI: Estimated body mass index is 22.14 kg/m as calculated from the following:   Height as of this encounter: 5\' 3"  (1.6 m).   Weight as of this encounter: 125 lb (56.7 kg). Last encounter: 03/30/2023. Last procedure: 05/03/2023.  Reason for encounter: post-procedure evaluation and assessment.    Post-procedure evaluation    Type: Lumbar Facet, Medial Branch Block(s) (w/  fluoroscopic mapping) #2  Laterality: Bilateral  Level: L3, L4, and L5 Medial Branch Level(s). Injecting these levels blocks the L3-4 and L4-5 lumbar facet joints. Imaging: Fluoroscopic guidance         Anesthesia: Local anesthesia (1-2% Lidocaine Sedation: Minimal Sedation                       DOS: 05/03/2023 Performed by: Cephus Collin, MD  Primary Purpose: Diagnostic/Therapeutic Indications: Low back pain severe enough to impact quality of life or function. 1. Lumbar spondylosis   2. Lumbar facet arthropathy    NAS-11 Pain score:   Pre-procedure: 4 /10   Post-procedure: 0-No pain/10    Effectiveness:  Initial hour after procedure: 100 %  Subsequent 4-6 hours post-procedure: 100 % (X 3 days)  Analgesia past initial 6 hours: 100% for 3 days Ongoing improvement:  Analgesic:  50% Function: Somewhat improved ROM: Somewhat improved   ROS  Constitutional: Denies any fever or chills Gastrointestinal: No reported hemesis, hematochezia, vomiting, or acute GI distress Musculoskeletal:  Low back pain related to lumbar facet arthropathy Neurological: No reported episodes of acute onset apraxia, aphasia, dysarthria, agnosia, amnesia, paralysis, loss of coordination, or loss of consciousness  Medication Review  DULoxetine, Empagliflozin, aspirin EC, cyclobenzaprine, gabapentin, glipiZIDE, lisinopril, nitroGLYCERIN, ondansetron, pantoprazole, and ticagrelor  History Review  Allergy: Ms. Surratt is allergic to bee venom. Drug: Ms. Lubben  reports current drug use. Drug: Marijuana. Alcohol:  reports that she does not currently use alcohol after a past usage of about 12.0 standard drinks of alcohol per week. Tobacco:  reports that she has never smoked. She has never used smokeless tobacco. Social: Ms. Willis  reports  that she has never smoked. She has never used smokeless tobacco. She reports that she does not currently use alcohol after a past usage of about 12.0 standard drinks of alcohol per  week. She reports current drug use. Drug: Marijuana. Medical:  has a past medical history of Diabetes mellitus without complication (HCC), Hypertension, and MI (myocardial infarction) (HCC). Surgical: Ms. Dall  has a past surgical history that includes Coronary angioplasty with stent; IR Radiologist Eval & Mgmt (09/20/2022); IR KYPHO LUMBAR INC FX REDUCE BONE BX UNI/BIL CANNULATION INC/IMAGING (10/06/2022); IR Radiologist Eval & Mgmt (10/13/2022); and IR Radiologist Eval & Mgmt (10/20/2022). Family: family history is not on file.  Laboratory Chemistry Profile   Renal Lab Results  Component Value Date   BUN 15 10/21/2022   CREATININE 0.95 10/21/2022   BCR 10 09/20/2022   GFRAA >60 12/30/2015   GFRNONAA >60 10/21/2022    Hepatic Lab Results  Component Value Date   AST 17 10/18/2022   ALT 12 10/18/2022   ALBUMIN 4.6 10/18/2022   ALKPHOS 126 10/18/2022   LIPASE 55 (H) 10/18/2022    Electrolytes Lab Results  Component Value Date   NA 124 (L) 10/21/2022   K 3.1 (L) 10/21/2022   CL 88 (L) 10/21/2022   CALCIUM 9.1 10/21/2022   MG 1.6 (L) 10/19/2022   PHOS 2.8 10/19/2022    Bone No results found for: "VD25OH", "VD125OH2TOT", "ZO1096EA5", "WU9811BJ4", "25OHVITD1", "25OHVITD2", "25OHVITD3", "TESTOFREE", "TESTOSTERONE"  Inflammation (CRP: Acute Phase) (ESR: Chronic Phase) Lab Results  Component Value Date   LATICACIDVEN 1.8 10/18/2022         Note: Above Lab results reviewed.  Recent Imaging Review  L1-L2: No significant disc herniation or stenosis.   L2-L3: Mild bony retropulsion at the level of the L2 inferior endplate, slightly progressed. Progressive disc bulge. Facet arthrosis and ligamentum flavum hypertrophy. Right greater than left subarticular narrowing. Mild narrowing of the central canal. No significant foraminal stenosis.   L3-L4: Disc bulge. Superimposed broad-based disc protrusion spanning the central and bilateral subarticular zones. Facet arthrosis and ligamentum  flavum hypertrophy. Bilateral subarticular narrowing. Moderate central canal stenosis. No significant foraminal stenosis. As before, there is a large anterior disc extrusion near midline which contacts the aorta.   L4-L5: Disc bulge with endplate osteophytes. Superimposed moderately large central disc extrusion with mild caudal migration. Facet arthrosis and ligamentum flavum hypertrophy. Severe bilateral subarticular and central canal stenosis. Bilateral neural foraminal narrowing (moderate/severe right, mild left).   L5-S1: Disc bulge with endplate spurring. Superimposed moderately enlarged central/left subarticular disc extrusion with mild cranial migration. Facet arthrosis and ligamentum flavum hypertrophy. The disc extrusion results in severe left subarticular stenosis and likely encroaches upon the descending left S1 nerve root. Mild right subarticular stenosis. Moderate/severe bilateral neural foraminal narrowing.   IMPRESSION: 1. L1 compression fracture (30-40% height loss) with vertically-oriented fracture through the vertebral body, new from the prior lumbar spine CT of 04/07/2021 and acute in appearance. 2. L2 inferior endplate vertebral compression fracture with progressive height loss as compared to the prior lumbar spine CT (now 40-50%). This is age-indeterminate but favored chronic given the degree of sclerosis at this site. 3. Apart from mild progression of a disc bulge at L2-L3, lumbar spondylosis is unchanged from the prior exam. Findings are most notably as follows. 4. At L3-L4, there is multifactorial bilateral subarticular narrowing and moderate central canal stenosis. 5. At L4-L5, there is multifactorial severe bilateral subarticular and central canal stenosis. Moderate/severe right neural foraminal and also present at this  level. 6. At L5-S1, there is multifactorial severe left subarticular stenosis. Moderate/severe bilateral neural foraminal narrowing  also present at this level. 7. Bilateral nonobstructive nephrolithiasis. 8. Distended urinary bladder, incompletely imaged. 9.  Aortic Atherosclerosis (ICD10-I70.0).   Electronically Signed By: Bascom Lily D.O. On: 06/28/2022 18:24 Note: Reviewed       Note: Reviewed        Physical Exam  General appearance: Well nourished, well developed, and well hydrated. In no apparent acute distress Mental status: Alert, oriented x 3 (person, place, & time)       Respiratory: No evidence of acute respiratory distress Eyes: PERLA Vitals: BP (!) 163/73   Pulse (!) 118   Temp (!) 97.5 F (36.4 C)   Resp 16   Ht 5\' 3"  (1.6 m)   Wt 125 lb (56.7 kg)   LMP 09/29/2015 (Approximate) Comment: neg. preg test  SpO2 100%   BMI 22.14 kg/m  BMI: Estimated body mass index is 22.14 kg/m as calculated from the following:   Height as of this encounter: 5\' 3"  (1.6 m).   Weight as of this encounter: 125 lb (56.7 kg). Ideal: Ideal body weight: 52.4 kg (115 lb 8.3 oz) Adjusted ideal body weight: 54.1 kg (119 lb 5 oz)  Lumbar Spine Area Exam  Skin & Axial Inspection: No masses, redness, or swelling Alignment: Symmetrical Functional ROM: Pain restricted ROM affecting both sides Stability: No instability detected Muscle Tone/Strength: Functionally intact. No obvious neuro-muscular anomalies detected. Sensory (Neurological): Musculoskeletal pain pattern Palpation: No palpable anomalies       Provocative Tests: Hyperextension/rotation test: (+) bilaterally for facet joint pain. Lumbar quadrant test (Kemp's test): (+) bilaterally for facet joint pain.   Lower Extremity Exam      Side: Right lower extremity   Side: Left lower extremity  Stability: No instability observed           Stability: No instability observed          Skin & Extremity Inspection: Skin color, temperature, and hair growth are WNL. No peripheral edema or cyanosis. No masses, redness, swelling, asymmetry, or associated skin lesions. No  contractures.   Skin & Extremity Inspection: Skin color, temperature, and hair growth are WNL. No peripheral edema or cyanosis. No masses, redness, swelling, asymmetry, or associated skin lesions. No contractures.  Functional ROM: Unrestricted ROM                   Functional ROM: Unrestricted ROM                  Muscle Tone/Strength: Functionally intact. No obvious neuro-muscular anomalies detected.   Muscle Tone/Strength: Functionally intact. No obvious neuro-muscular anomalies detected.  Sensory (Neurological): Unimpaired         Sensory (Neurological): Unimpaired        DTR: Patellar: deferred today Achilles: deferred today Plantar: deferred today   DTR: Patellar: deferred today Achilles: deferred today Plantar: deferred today  Palpation: No palpable anomalies   Palpation: No palpable anomalies         Assessment   Diagnosis  1. Lumbar spondylosis   2. Lumbar facet arthropathy   3. Chronic bilateral low back pain without sciatica   4. Chronic pain syndrome      Updated Problems: Problem  Lumbar Facet Arthropathy  Chronic Pain Syndrome    Plan of Care  Patient is status post 2 positive diagnostic bilateral lumbar facet medial branch nerve blocks done 01/18/2023, 05/03/2023.  Discussed lumbar radiofrequency ablation as  a next step for therapeutic benefit. Orders:  Orders Placed This Encounter  Procedures   Radiofrequency,Lumbar    Standing Status:   Future    Expiration Date:   08/30/2023    Scheduling Instructions:     Side(s): Bilateral     Level(s): L3, L4, L5, Medial Branch Nerve(s)     Sedation: With Sedation     Scheduling Timeframe: As soon as pre-approved    Where will this procedure be performed?:   [facility]   Follow-up plan:   Return in about 4 weeks (around 06/28/2023) for B/L L3, 4, 5 RFA, in clinic IV Versed (stop Brillinta 3 days prior).     BLF L3-5 01/18/23, 05/03/2023    Recent Visits Date Type Provider Dept  05/03/23 Procedure visit  Cephus Collin, MD [facility]  03/30/23 Office Visit Cephus Collin, MD [facility]  Showing recent visits within past 90 days and meeting all other requirements Today's Visits Date Type Provider Dept  05/31/23 Office Visit Cephus Collin, MD [facility]  Showing today's visits and meeting all other requirements Future Appointments No visits were found meeting these conditions. Showing future appointments within next 90 days and meeting all other requirements  I discussed the assessment and treatment plan with the patient. The patient was provided an opportunity to ask questions and all were answered. The patient agreed with the plan and demonstrated an understanding of the instructions.  Patient advised to call back or seek an in-person evaluation if the symptoms or condition worsens.  Duration of encounter: .  Total time on encounter, as per AMA guidelines included both the face-to-face and non-face-to-face time personally spent by the physician and/or other qualified health care professional(s) on the day of the encounter (includes time in activities that require the physician or other qualified health care professional and does not include time in activities normally performed by clinical staff). Physician's time may include the following activities when performed: Preparing to see the patient (e.g., pre-charting review of records, searching for previously ordered imaging, lab work, and nerve conduction tests) Review of prior analgesic pharmacotherapies. Reviewing PMP Interpreting ordered tests (e.g., lab work, imaging, nerve conduction tests) Performing post-procedure evaluations, including interpretation of diagnostic procedures Obtaining and/or reviewing separately obtained history Performing a medically appropriate examination and/or evaluation Counseling and educating the patient/family/caregiver Ordering medications, tests, or  procedures Referring and communicating with other health care professionals (when not separately reported) Documenting clinical information in the electronic or other health record Independently interpreting results (not separately reported) and communicating results to the patient/ family/caregiver Care coordination (not separately reported)  Note by: Cephus Collin, MD (TTS and AI technology used. I apologize for any typographical errors that were not detected and corrected.) Date: 05/31/2023; Time: 2:17 PM

## 2023-06-01 ENCOUNTER — Other Ambulatory Visit: Payer: Self-pay

## 2023-06-01 ENCOUNTER — Encounter: Payer: Self-pay | Admitting: Neurology

## 2023-06-01 DIAGNOSIS — R202 Paresthesia of skin: Secondary | ICD-10-CM

## 2023-06-21 DIAGNOSIS — Z79899 Other long term (current) drug therapy: Secondary | ICD-10-CM | POA: Diagnosis not present

## 2023-06-21 DIAGNOSIS — S80861A Insect bite (nonvenomous), right lower leg, initial encounter: Secondary | ICD-10-CM | POA: Diagnosis not present

## 2023-06-21 DIAGNOSIS — I214 Non-ST elevation (NSTEMI) myocardial infarction: Secondary | ICD-10-CM | POA: Diagnosis not present

## 2023-06-21 DIAGNOSIS — Z7982 Long term (current) use of aspirin: Secondary | ICD-10-CM | POA: Diagnosis not present

## 2023-06-21 DIAGNOSIS — E1165 Type 2 diabetes mellitus with hyperglycemia: Secondary | ICD-10-CM | POA: Diagnosis not present

## 2023-06-21 DIAGNOSIS — I10 Essential (primary) hypertension: Secondary | ICD-10-CM | POA: Diagnosis not present

## 2023-06-21 DIAGNOSIS — L03115 Cellulitis of right lower limb: Secondary | ICD-10-CM | POA: Diagnosis not present

## 2023-06-21 DIAGNOSIS — Z7984 Long term (current) use of oral hypoglycemic drugs: Secondary | ICD-10-CM | POA: Diagnosis not present

## 2023-06-23 DIAGNOSIS — E1165 Type 2 diabetes mellitus with hyperglycemia: Secondary | ICD-10-CM | POA: Diagnosis not present

## 2023-06-23 DIAGNOSIS — Z7984 Long term (current) use of oral hypoglycemic drugs: Secondary | ICD-10-CM | POA: Diagnosis not present

## 2023-06-23 DIAGNOSIS — I10 Essential (primary) hypertension: Secondary | ICD-10-CM | POA: Diagnosis not present

## 2023-06-23 DIAGNOSIS — E1142 Type 2 diabetes mellitus with diabetic polyneuropathy: Secondary | ICD-10-CM | POA: Diagnosis not present

## 2023-06-23 DIAGNOSIS — Z87891 Personal history of nicotine dependence: Secondary | ICD-10-CM | POA: Diagnosis not present

## 2023-06-23 DIAGNOSIS — Z8249 Family history of ischemic heart disease and other diseases of the circulatory system: Secondary | ICD-10-CM | POA: Diagnosis not present

## 2023-06-23 DIAGNOSIS — Z7982 Long term (current) use of aspirin: Secondary | ICD-10-CM | POA: Diagnosis not present

## 2023-06-23 DIAGNOSIS — I25110 Atherosclerotic heart disease of native coronary artery with unstable angina pectoris: Secondary | ICD-10-CM | POA: Diagnosis not present

## 2023-06-23 DIAGNOSIS — L03115 Cellulitis of right lower limb: Secondary | ICD-10-CM | POA: Diagnosis not present

## 2023-06-23 DIAGNOSIS — E119 Type 2 diabetes mellitus without complications: Secondary | ICD-10-CM | POA: Diagnosis not present

## 2023-06-23 DIAGNOSIS — Z791 Long term (current) use of non-steroidal anti-inflammatories (NSAID): Secondary | ICD-10-CM | POA: Diagnosis not present

## 2023-06-24 DIAGNOSIS — E1159 Type 2 diabetes mellitus with other circulatory complications: Secondary | ICD-10-CM | POA: Diagnosis not present

## 2023-06-24 DIAGNOSIS — S90561A Insect bite (nonvenomous), right ankle, initial encounter: Secondary | ICD-10-CM | POA: Insufficient documentation

## 2023-06-24 DIAGNOSIS — I10 Essential (primary) hypertension: Secondary | ICD-10-CM | POA: Diagnosis not present

## 2023-06-24 DIAGNOSIS — M25471 Effusion, right ankle: Secondary | ICD-10-CM | POA: Diagnosis not present

## 2023-06-24 DIAGNOSIS — M7731 Calcaneal spur, right foot: Secondary | ICD-10-CM | POA: Diagnosis not present

## 2023-06-24 DIAGNOSIS — I2510 Atherosclerotic heart disease of native coronary artery without angina pectoris: Secondary | ICD-10-CM | POA: Diagnosis not present

## 2023-06-24 DIAGNOSIS — L03115 Cellulitis of right lower limb: Secondary | ICD-10-CM | POA: Insufficient documentation

## 2023-06-29 DIAGNOSIS — L0390 Cellulitis, unspecified: Secondary | ICD-10-CM | POA: Diagnosis not present

## 2023-06-29 DIAGNOSIS — Z0131 Encounter for examination of blood pressure with abnormal findings: Secondary | ICD-10-CM | POA: Diagnosis not present

## 2023-06-29 DIAGNOSIS — F17200 Nicotine dependence, unspecified, uncomplicated: Secondary | ICD-10-CM | POA: Insufficient documentation

## 2023-06-29 DIAGNOSIS — Z1389 Encounter for screening for other disorder: Secondary | ICD-10-CM | POA: Diagnosis not present

## 2023-06-29 DIAGNOSIS — F330 Major depressive disorder, recurrent, mild: Secondary | ICD-10-CM | POA: Diagnosis not present

## 2023-06-29 DIAGNOSIS — Z0130 Encounter for examination of blood pressure without abnormal findings: Secondary | ICD-10-CM | POA: Diagnosis not present

## 2023-07-03 ENCOUNTER — Ambulatory Visit
Attending: Student in an Organized Health Care Education/Training Program | Admitting: Student in an Organized Health Care Education/Training Program

## 2023-07-07 ENCOUNTER — Encounter: Admitting: Neurology

## 2023-07-10 NOTE — Progress Notes (Deleted)
 Referring Physician:  Lorina Roosevelt, MD [address] [place],  [place] [address]  Primary Physician:  Lorina Roosevelt, MD  History of Present Illness: [name] has a history of HTN, CAD, and DM. Recent diagnosis of vertigo.   She had L1 kyphoplasty on 10/06/22. She was doing well after her procedure with no pain until a week later when she tripped over her cat on 10/11/22.   Last seen by me on 02/16/23 for follow up. She has known lumbar spondylosis with DDD, moderate central stenosis L4-L5 with left lateral recess stenosis and mild/moderate foraminal stenosis on left at L5-S1.    LBP is likely due to underlying spondylosis.   She has short term improvement with lumbar MBB. PT was ordered at her last visit. She was given note keeping her out of work.   She had repeat MBB L3-L5 by Dr. Rhesa Celeste on 05/03/23.   She declined PT. She was given flexeril  and referred to pain management. She was given note to keep her out of work until her follow up.   Dr. Rhesa Celeste sent her to PT and she had bilateral L3, L4, L5 MBB on 05/03/23. He also prescribed flexeril  for her.   She had good relief after her MBB for about a week or so. Pain is back to her baseline. She is back to her constant LBP with no leg pain. Pain is worse with bending, lifting, prolonged walking.   She has known neuropathy with intermittent numbness in right leg in the calf and intermittent cramping in left leg. Numbness is worse with prolonged walking.   She is taking neurontin . She has not been taking flexeril .   Bowel/Bladder Dysfunction: none  Conservative measures:  Physical therapy: nothing recent Multimodal medical therapy including regular antiinflammatories: percocet   Injections:  bilateral L3, L4, L5 MBB on 05/03/23 bilateral L3, L4, L5 MBB on 01/18/23  Past Surgery: L1 kyphoplasty on 10/06/22  Claudia Mills has no symptoms of cervical myelopathy.  The symptoms are causing a significant impact on the  patient's life.   Review of Systems:  A 10 point review of systems is negative, except for the pertinent positives and negatives detailed in the HPI.  Past Medical History: Past Medical History:  Diagnosis Date   Diabetes mellitus without complication (HCC)    Hypertension    MI (myocardial infarction) (HCC)     Past Surgical History: Past Surgical History:  Procedure Laterality Date   CORONARY ANGIOPLASTY WITH STENT PLACEMENT     IR KYPHO LUMBAR INC FX REDUCE BONE BX UNI/BIL CANNULATION INC/IMAGING  10/06/2022   IR RADIOLOGIST EVAL & MGMT  09/20/2022   IR RADIOLOGIST EVAL & MGMT  10/13/2022   IR RADIOLOGIST EVAL & MGMT  10/20/2022    Allergies: Allergies as of 07/19/2023 - Review Complete 05/31/2023  Allergen Reaction Noted   Bee venom Anaphylaxis 04/07/2021    Medications: Outpatient Encounter Medications as of 07/19/2023  Medication Sig   aspirin  EC 81 MG tablet Take 81 mg by mouth daily. Swallow whole.   BRILINTA  90 MG TABS tablet Take 90 mg by mouth 2 (two) times daily.   cyclobenzaprine  (FLEXERIL ) 5 MG tablet Take 1 tablet (5 mg total) by mouth 3 (three) times daily as needed for muscle spasms.   DULoxetine  (CYMBALTA ) 30 MG capsule Take 30 mg by mouth daily.   Empagliflozin (JARDIANCE PO) Take 25 mg by mouth daily.   gabapentin  (NEURONTIN ) 300 MG capsule Take 300 mg by mouth  3 (three) times daily.   glipiZIDE  (GLUCOTROL ) 5 MG tablet Take 1 tablet (5 mg total) by mouth daily before breakfast.   lisinopril  (ZESTRIL ) 20 MG tablet Take 1 tablet (20 mg total) by mouth daily.   nitroGLYCERIN  (NITROSTAT ) 0.3 MG SL tablet Place 0.3 mg under the tongue every 5 (five) minutes as needed for chest pain.   ondansetron  (ZOFRAN -ODT) 4 MG disintegrating tablet Take 1 tablet (4 mg total) by mouth every 8 (eight) hours as needed for nausea or vomiting.   pantoprazole  (PROTONIX ) 40 MG tablet Take 1 tablet (40 mg total) by mouth daily.   No facility-administered encounter medications on file as  of 07/19/2023.    Social History: Social History   Tobacco Use   Smoking status: Never   Smokeless tobacco: Never  Vaping Use   Vaping status: Never Used  Substance Use Topics   Alcohol use: Not Currently    Alcohol/week: 12.0 standard drinks of alcohol    Types: 12 Cans of beer per week   Drug use: Yes    Types: Marijuana    Family Medical History: No family history on file.  Physical Examination: There were no vitals filed for this visit.      Awake, alert, oriented to person, place, and time.  Speech is clear and fluent. Fund of knowledge is appropriate.   Cranial Nerves: Pupils equal round and reactive to light.  Facial tone is symmetric.    She has diffuse mid to lower lumbar tenderness.   No abnormal lesions on exposed skin.   Strength: Side Iliopsoas Quads Hamstring PF DF EHL  R 5 5 5 5 5 5   L 5 5 5 5 5 5    Clonus is not present.   Bilateral lower extremity sensation is intact to light touch, slightly diminished in right leg.   Patella and achilles reflex is 2+ and symmetric bilaterally. No pain with IR/ER of both hips.   Gait is normal.     Medical Decision Making  Imaging: none  Assessment and Plan: [name] is a pleasant 53 y.o. female who had L1 kyphoplasty on 10/06/22. She was doing well after her procedure with no pain until a week later when she tripped over her cat on 10/11/22.   She had good relief after her second MBB for about a week or so. She is back to her constant LBP with no leg pain.   She has known neuropathy with new intermittent numbness in right leg in the calf and intermittent cramping in left leg. Numbness is worse with prolonged walking. She never had an EMG.   Previous MRI showed lumbar spondylosis with DDD, moderate central stenosis L4-L5 with left lateral recess stenosis and mild/moderate foraminal stenosis on left at L5-S1.   Numbness in right leg is more suspicious for neuropathy, but spinal stenosis could be contributing.    Treatment options discussed with patient and following plan made:   - Follow up with Dr. Rhesa Celeste as scheduled to discuss further RFA.  - Continue with PT for her lumbar spine.  - EMG/NCS of bilateral lower extremities to evaluate numbness in right leg. Orders to [facility] neurology.  - She does bending, twisting, and lifting at work (packs socks). Note keeping her out until follow up with me in 6 weeks. She will likely need FCE but will hold until after RFA.  - Will likely set up phone visit to review her EMG results.   BP was elevated. She has not taken her BP  meds yet. No symptoms of chest pain, shortness of breath, blurry vision, or headaches. She will take her medications and recheck her BP at home. She will call PCP with any issues. If she develops CP, SOB, blurry vision, or headaches, then she will go to ED.     I spent a total of 20 minutes in face-to-face and non-face-to-face activities related to this patient's care today including review of outside records, review of imaging, review of symptoms, physical exam, discussion of differential diagnosis, discussion of treatment options, and documentation.   Lucetta Russel PA-C Dept. of Neurosurgery

## 2023-07-19 ENCOUNTER — Ambulatory Visit
Attending: Student in an Organized Health Care Education/Training Program | Admitting: Student in an Organized Health Care Education/Training Program

## 2023-07-19 ENCOUNTER — Ambulatory Visit
Admission: RE | Admit: 2023-07-19 | Discharge: 2023-07-19 | Disposition: A | Discharge status: Home / Self Care | Source: Ambulatory Visit | Attending: Student in an Organized Health Care Education/Training Program | Admitting: Student in an Organized Health Care Education/Training Program

## 2023-07-19 ENCOUNTER — Encounter: Payer: Self-pay | Admitting: Student in an Organized Health Care Education/Training Program

## 2023-07-19 ENCOUNTER — Ambulatory Visit: Admitting: Orthopedic Surgery

## 2023-07-19 VITALS — BP 166/92 | HR 108 | Temp 97.7°F | Resp 16 | Ht 63.0 in | Wt 130.0 lb

## 2023-07-19 DIAGNOSIS — M47816 Spondylosis without myelopathy or radiculopathy, lumbar region: Secondary | ICD-10-CM | POA: Insufficient documentation

## 2023-07-19 DIAGNOSIS — M545 Low back pain, unspecified: Secondary | ICD-10-CM | POA: Insufficient documentation

## 2023-07-19 DIAGNOSIS — G8929 Other chronic pain: Secondary | ICD-10-CM | POA: Diagnosis present

## 2023-07-19 MED ORDER — LIDOCAINE HCL 2 % IJ SOLN
20.0000 mL | Freq: Once | INTRAMUSCULAR | Status: AC
  Administered 2023-07-19: 400 mg
  Filled 2023-07-19: qty 20

## 2023-07-19 MED ORDER — DEXAMETHASONE SODIUM PHOSPHATE 10 MG/ML IJ SOLN
20.0000 mg | Freq: Once | INTRAMUSCULAR | Status: AC
  Administered 2023-07-19: 20 mg
  Filled 2023-07-19: qty 2

## 2023-07-19 MED ORDER — MIDAZOLAM HCL 2 MG/2ML IJ SOLN
0.5000 mg | Freq: Once | INTRAMUSCULAR | Status: AC
  Administered 2023-07-19: 2 mg via INTRAVENOUS
  Filled 2023-07-19: qty 2

## 2023-07-19 MED ORDER — ROPIVACAINE HCL 2 MG/ML IJ SOLN
18.0000 mL | Freq: Once | INTRAMUSCULAR | Status: AC
  Administered 2023-07-19: 18 mL via PERINEURAL
  Filled 2023-07-19: qty 20

## 2023-07-19 MED ORDER — LACTATED RINGERS IV SOLN: Freq: Once | INTRAVENOUS | Status: AC

## 2023-07-19 NOTE — Progress Notes (Signed)
 PROVIDER NOTE: Interpretation of information contained herein should be left to medically-trained personnel. Specific patient instructions are provided elsewhere under "Patient Instructions" section of medical record. This document was created in part using STT-dictation technology, any transcriptional errors that may result from this process are unintentional.  Patient: Claudia Mills Type: Established DOB: [birth year] MRN: [id number] PCP: Lorina Roosevelt, MD  Service: Procedure DOS: 07/19/2023 Setting: Ambulatory Location: Ambulatory outpatient facility Delivery: Face-to-face Provider: Cephus Collin, MD Specialty: Interventional Pain Management Specialty designation: 09 Location: Outpatient facility Ref. Prov.: Lorina Roosevelt, MD       Interventional Therapy   Procedure: Lumbar Facet, Medial Branch Radiofrequency Ablation (RFA) #1  Laterality: Bilateral (-50)  Level: L3, L4, and L5 Medial Branch Level(s). These levels will denervate the L3-4 and L4-5 lumbar facet joints.  Imaging: Fluoroscopy-guided         Anesthesia: Local anesthesia (1-2% Lidocaine ) Sedation: Minimal Sedation                       DOS: 07/19/2023  Performed by: Cephus Collin, MD  Purpose: Therapeutic/Palliative Indications: Low back pain severe enough to impact quality of life or function. Indications: 1. Lumbar spondylosis   2. Lumbar facet arthropathy   3. Chronic bilateral low back pain without sciatica    [name] has been dealing with the above chronic pain for longer than three months and has either failed to respond, was unable to tolerate, or simply did not get enough benefit from other more conservative therapies including, but not limited to: 1. Over-the-counter medications 2. Anti-inflammatory medications 3. Muscle relaxants 4. Membrane stabilizers 5. Opioids 6. Physical therapy and/or chiropractic manipulation 7. Modalities (Heat, ice, etc.) 8. Invasive techniques such as nerve blocks. [name] has  attained more than 50% relief of the pain from a series of diagnostic injections conducted in separate occasions.  Pain Score: Pre-procedure: 3 /10 Post-procedure: 3 /10     Position / Prep / Materials:  Position: Prone  Prep solution: ChloraPrep (2% chlorhexidine gluconate and 70% isopropyl alcohol) Prep Area: Entire Lumbosacral Region (Lower back from mid-thoracic region to end of tailbone and from flank to flank.) Materials:  Tray: RFA (Radiofrequency) tray Needle(s):  Type: RFA (Teflon-coated radiofrequency ablation needles) Gauge (G): 22  Length: Regular (10cm) Qty: 3     H&P (Pre-op Assessment):  Claudia Mills is a 53 y.o. (year old), female patient, seen today for interventional treatment. She  has a past surgical history that includes Coronary angioplasty with stent; IR Radiologist Eval & Mgmt (09/20/2022); IR KYPHO LUMBAR INC FX REDUCE BONE BX UNI/BIL CANNULATION INC/IMAGING (10/06/2022); IR Radiologist Eval & Mgmt (10/13/2022); and IR Radiologist Eval & Mgmt (10/20/2022). Claudia Mills has a current medication list which includes the following prescription(s): aspirin  ec, cyclobenzaprine , duloxetine , empagliflozin, gabapentin , glipizide , lisinopril , nitroglycerin , ondansetron , pantoprazole , and brilinta , and the following Facility-Administered Medications: lactated ringers . Her primarily concern today is the Back Pain  Initial Vital Signs:  Pulse/HCG Rate: (!) 108ECG Heart Rate: 99 Temp: 97.7 F (36.5 C) Resp: 17 BP: (!) 150/85 SpO2: 100 %  BMI: Estimated body mass index is 23.03 kg/m as calculated from the following:   Height as of this encounter: 5\' 3"  (1.6 m).   Weight as of this encounter: 130 lb (59 kg).  Risk Assessment: Allergies: Reviewed. She is allergic to bee venom.  Allergy Precautions: None required Coagulopathies: Reviewed. None identified.  Blood-thinner therapy: None at this time Active Infection(s): Reviewed. None identified. Claudia Mills is afebrile  Site  Confirmation: Claudia Mills was asked to confirm the procedure and laterality before marking the site Procedure checklist: Completed Consent: Before the procedure and under the influence of no sedative(s), amnesic(s), or anxiolytics, the patient was informed of the treatment options, risks and possible complications. To fulfill our ethical and legal obligations, as recommended by the American Medical Association's Code of Ethics, I have informed the patient of my clinical impression; the nature and purpose of the treatment or procedure; the risks, benefits, and possible complications of the intervention; the alternatives, including doing nothing; the risk(s) and benefit(s) of the alternative treatment(s) or procedure(s); and the risk(s) and benefit(s) of doing nothing. The patient was provided information about the general risks and possible complications associated with the procedure. These may include, but are not limited to: failure to achieve desired goals, infection, bleeding, organ or nerve damage, allergic reactions, paralysis, and death. In addition, the patient was informed of those risks and complications associated to Spine-related procedures, such as failure to decrease pain; infection (i.e.: Meningitis, epidural or intraspinal abscess); bleeding (i.e.: epidural hematoma, subarachnoid hemorrhage, or any other type of intraspinal or peri-dural bleeding); organ or nerve damage (i.e.: Any type of peripheral nerve, nerve root, or spinal cord injury) with subsequent damage to sensory, motor, and/or autonomic systems, resulting in permanent pain, numbness, and/or weakness of one or several areas of the body; allergic reactions; (i.e.: anaphylactic reaction); and/or death. Furthermore, the patient was informed of those risks and complications associated with the medications. These include, but are not limited to: allergic reactions (i.e.: anaphylactic or anaphylactoid reaction(s)); adrenal axis suppression;  blood sugar elevation that in diabetics may result in ketoacidosis or comma; water retention that in patients with history of congestive heart failure may result in shortness of breath, pulmonary edema, and decompensation with resultant heart failure; weight gain; swelling or edema; medication-induced neural toxicity; particulate matter embolism and blood vessel occlusion with resultant organ, and/or nervous system infarction; and/or aseptic necrosis of one or more joints. Finally, the patient was informed that Medicine is not an exact science; therefore, there is also the possibility of unforeseen or unpredictable risks and/or possible complications that may result in a catastrophic outcome. The patient indicated having understood very clearly. We have given the patient no guarantees and we have made no promises. Enough time was given to the patient to ask questions, all of which were answered to the patient's satisfaction. Ms. Eimer has indicated that she wanted to continue with the procedure. Attestation: I, the ordering provider, attest that I have discussed with the patient the benefits, risks, side-effects, alternatives, likelihood of achieving goals, and potential problems during recovery for the procedure that I have provided informed consent. Date  Time: 07/19/2023  9:06 AM  Pre-Procedure Preparation:  Monitoring: As per clinic protocol. Respiration, ETCO2, SpO2, BP, heart rate and rhythm monitor placed and checked for adequate function Safety Precautions: Patient was assessed for positional comfort and pressure points before starting the procedure. Time-out: I initiated and conducted the "Time-out" before starting the procedure, as per protocol. The patient was asked to participate by confirming the accuracy of the "Time Out" information. Verification of the correct person, site, and procedure were performed and confirmed by me, the nursing staff, and the patient. "Time-out" conducted as per Joint  Commission's Universal Protocol (UP.01.01.01). Time: 1023 Start Time: 1023 hrs.  Description of Procedure:          Laterality: See above. Levels:  See above. Safety Precautions: Aspiration looking for blood return  was conducted prior to all injections. At no point did we inject any substances, as a needle was being advanced. Before injecting, the patient was told to immediately notify me if she was experiencing any new onset of "ringing in the ears, or metallic taste in the mouth". No attempts were made at seeking any paresthesias. Safe injection practices and needle disposal techniques used. Medications properly checked for expiration dates. SDV (single dose vial) medications used. After the completion of the procedure, all disposable equipment used was discarded in the proper designated medical waste containers. Local Anesthesia: Protocol guidelines were followed. The patient was positioned over the fluoroscopy table. The area was prepped in the usual manner. The time-out was completed. The target area was identified using fluoroscopy. A 12-in long, straight, sterile hemostat was used with fluoroscopic guidance to locate the targets for each level blocked. Once located, the skin was marked with an approved surgical skin marker. Once all sites were marked, the skin (epidermis, dermis, and hypodermis), as well as deeper tissues (fat, connective tissue and muscle) were infiltrated with a small amount of a short-acting local anesthetic, loaded on a 10cc syringe with a 25G, 1.5-in  Needle. An appropriate amount of time was allowed for local anesthetics to take effect before proceeding to the next step. Technical description of process:  Radiofrequency Ablation (RFA)  L3 Medial Branch Nerve RFA: The target area for the L3 medial branch is at the junction of the postero-lateral aspect of the superior articular process and the superior, posterior, and medial edge of the transverse process of L4. Under  fluoroscopic guidance, a Radiofrequency needle was inserted until contact was made with os over the superior postero-lateral aspect of the pedicular shadow (target area). Sensory and motor testing was conducted to properly adjust the position of the needle. Once satisfactory placement of the needle was achieved, the numbing solution was slowly injected after negative aspiration for blood. 2.0 mL of the nerve block solution was injected without difficulty or complication. After waiting for at least 3 minutes, the ablation was performed. Once completed, the needle was removed intact. L4 Medial Branch Nerve RFA: The target area for the L4 medial branch is at the junction of the postero-lateral aspect of the superior articular process and the superior, posterior, and medial edge of the transverse process of L5. Under fluoroscopic guidance, a Radiofrequency needle was inserted until contact was made with os over the superior postero-lateral aspect of the pedicular shadow (target area). Sensory and motor testing was conducted to properly adjust the position of the needle. Once satisfactory placement of the needle was achieved, the numbing solution was slowly injected after negative aspiration for blood. 2.0 mL of the nerve block solution was injected without difficulty or complication. After waiting for at least 3 minutes, the ablation was performed. Once completed, the needle was removed intact. L5 Medial Branch Nerve RFA: The target area for the L5 medial branch is at the junction of the postero-lateral aspect of the superior articular process of S1 and the superior, posterior, and medial edge of the sacral ala. Under fluoroscopic guidance, a Radiofrequency needle was inserted until contact was made with os over the superior postero-lateral aspect of the pedicular shadow (target area). Sensory and motor testing was conducted to properly adjust the position of the needle. Once satisfactory placement of the needle was  achieved, the numbing solution was slowly injected after negative aspiration for blood. 2.0 mL of the nerve block solution was injected without difficulty or complication. After waiting  for at least 3 minutes, the ablation was performed. Once completed, the needle was removed intact.  Radiofrequency lesioning (ablation):  Radiofrequency Generator: Medtronic AccurianTM AG 1000 RF Generator Sensory Stimulation Parameters: 50 Hz was used to locate & identify the nerve, making sure that the needle was positioned such that there was no sensory stimulation below 0.3 V or above 0.7 V. Motor Stimulation Parameters: 2 Hz was used to evaluate the motor component. Care was taken not to lesion any nerves that demonstrated motor stimulation of the lower extremities at an output of less than 2.5 times that of the sensory threshold, or a maximum of 2.0 V. Lesioning Technique Parameters: Standard Radiofrequency settings. (Not bipolar or pulsed.) Temperature Settings: 80 degrees C Lesioning time: 60 seconds Stationary intra-operative compliance: Compliant  Once the entire procedure was completed, the treated area was cleaned, making sure to leave some of the prepping solution back to take advantage of its long term bactericidal properties.    Illustration of the posterior view of the lumbar spine and the posterior neural structures. Laminae of L2 through S1 are labeled. DPRL5, dorsal primary ramus of L5; DPRS1, dorsal primary ramus of S1; DPR3, dorsal primary ramus of L3; FJ, facet (zygapophyseal) joint L3-L4; I, inferior articular process of L4; LB1, lateral branch of dorsal primary ramus of L1; IAB, inferior articular branches from L3 medial branch (supplies L4-L5 facet joint); IBP, intermediate branch plexus; MB3, medial branch of dorsal primary ramus of L3; NR3, third lumbar nerve root; S, superior articular process of L5; SAB, superior articular branches from L4 (supplies L4-5 facet joint also); TP3, transverse  process of L3.  Facet Joint Innervation (* possible contribution)  L1-2 T12, L1 (L2*)  Medial Branch  L2-3 L1, L2 (L3*)         "          "  L3-4 L2, L3 (L4*)         "          "  L4-5 L3, L4 (L5*)         "          "  L5-S1 L4, L5, S1          "          "    Vitals:   07/19/23 1040 07/19/23 1045 07/19/23 1048 07/19/23 1053  BP: (!) 159/90 (!) 157/93 (!) 154/100 (!) 166/92  Pulse:      Resp: 12 17 17 16   Temp:      SpO2: 100% 100% 100% 100%  Weight:      Height:        Start Time: 1023 hrs. End Time: 1047 hrs.  Imaging Guidance (Spinal):         Type of Imaging Technique: Fluoroscopy Guidance (Spinal) Indication(s): Fluoroscopy guidance for needle placement to enhance accuracy in procedures requiring precise needle localization for targeted delivery of medication in or near specific anatomical locations not easily accessible without such real-time imaging assistance. Exposure Time: Please see nurses notes. Contrast: None used. Fluoroscopic Guidance: I was personally present during the use of fluoroscopy. "Tunnel Vision Technique" used to obtain the best possible view of the target area. Parallax error corrected before commencing the procedure. "Direction-depth-direction" technique used to introduce the needle under continuous pulsed fluoroscopy. Once target was reached, antero-posterior, oblique, and lateral fluoroscopic projection used confirm needle placement in all planes. Images permanently stored in EMR. Interpretation: No contrast injected. I personally interpreted the imaging intraoperatively. Adequate  needle placement confirmed in multiple planes. Permanent images saved into the patient's record.  Antibiotic Prophylaxis:   Anti-infectives (From admission, onward)    None      Indication(s): None identified   Post-operative Assessment:  Post-procedure Vital Signs:  Pulse/HCG Rate: (!) 108(!) 103 Temp: 97.7 F (36.5 C) Resp: 16 BP: (!) 166/92 SpO2: 100  %  EBL: None  Complications: No immediate post-treatment complications observed by team, or reported by patient.  Note: The patient tolerated the entire procedure well. A repeat set of vitals were taken after the procedure and the patient was kept under observation following institutional policy, for this type of procedure. Post-procedural neurological assessment was performed, showing return to baseline, prior to discharge. The patient was provided with post-procedure discharge instructions, including a section on how to identify potential problems. Should any problems arise concerning this procedure, the patient was given instructions to immediately contact us , at any time, without hesitation. In any case, we plan to contact the patient by telephone for a follow-up status report regarding this interventional procedure.  Comments:  No additional relevant information.  Plan of Care (POC)  Orders:  Orders Placed This Encounter  Procedures   DG PAIN CLIN[facility] C-ARM 1-60 MIN NO REPORT    Intraoperative interpretation by procedural physician at [facility].    Standing Status:   Standing    Number of Occurrences:   1    Reason for exam::   Assistance in needle guidance and placement for procedures requiring needle placement in or near specific anatomical locations not easily accessible without such assistance.     Medications ordered for procedure: Meds ordered this encounter  Medications   lidocaine  (XYLOCAINE ) 2 % (with pres) injection 400 mg   lactated ringers  infusion   midazolam  (VERSED ) injection 0.5-2 mg    Make sure Flumazenil is available in the pyxis when using this medication. If oversedation occurs, administer 0.2 mg IV over 15 sec. If after 45 sec no response, administer 0.2 mg again over 1 min; may repeat at 1 min intervals; not to exceed 4 doses (1 mg)   ropivacaine  (PF) 2 mg/mL (0.2%) (NAROPIN ) injection 18 mL   dexamethasone  (DECADRON ) injection 20 mg    Medications administered: We administered lidocaine , lactated ringers , midazolam , ropivacaine  (PF) 2 mg/mL (0.2%), and dexamethasone .  See the medical record for exact dosing, route, and time of administration.    BLF L3-5 01/18/23, 05/03/2023    Follow-up plan:   Return in about 8 weeks (around 09/13/2023).     Recent Visits Date Type Provider Dept  05/31/23 Office Visit Cephus Collin, MD [facility]  05/03/23 Procedure visit Cephus Collin, MD [facility]  Showing recent visits within past 90 days and meeting all other requirements Today's Visits Date Type Provider Dept  07/19/23 Procedure visit Cephus Collin, MD [facility]  Showing today's visits and meeting all other requirements Future Appointments Date Type Provider Dept  09/12/23 Appointment Cephus Collin, MD [facility]  Showing future appointments within next 90 days and meeting all other requirements   Disposition: Discharge home  Discharge (Date  Time): 07/19/2023; 1100 hrs.   Primary Care Physician: Lorina Roosevelt, MD Location: [facility] Note by: Cephus Collin, MD (TTS technology used. I apologize for any typographical errors that were not detected and corrected.) Date: 07/19/2023; Time: 11:05 AM  Disclaimer:  Medicine is not an Visual merchandiser. The only guarantee in medicine is that nothing is guaranteed. It  is important to note that the decision to proceed with this intervention was based on the information collected from the patient. The Data and conclusions were drawn from the patient's questionnaire, the interview, and the physical examination. Because the information was provided in large part by the patient, it cannot be guaranteed that it has not been purposely or unconsciously manipulated. Every effort has been made to obtain as much relevant data as possible for this evaluation. It is important to note that the conclusions that lead to this  procedure are derived in large part from the available data. Always take into account that the treatment will also be dependent on availability of resources and existing treatment guidelines, considered by other Pain Management Practitioners as being common knowledge and practice, at the time of the intervention. For Medico-Legal purposes, it is also important to point out that variation in procedural techniques and pharmacological choices are the acceptable norm. The indications, contraindications, technique, and results of the above procedure should only be interpreted and judged by a Board-Certified Interventional Pain Specialist with extensive familiarity and expertise in the same exact procedure and technique.

## 2023-07-19 NOTE — Progress Notes (Signed)
 Safety precautions to be maintained throughout the outpatient stay will include: orient to surroundings, keep bed in low position, maintain call bell within reach at all times, provide assistance with transfer out of bed and ambulation.

## 2023-07-19 NOTE — Patient Instructions (Signed)

## 2023-07-20 ENCOUNTER — Telehealth: Payer: Self-pay | Admitting: *Deleted

## 2023-07-20 NOTE — Telephone Encounter (Signed)
 Post procedure call; voicemail left

## 2023-07-24 DIAGNOSIS — F331 Major depressive disorder, recurrent, moderate: Secondary | ICD-10-CM | POA: Diagnosis not present

## 2023-07-24 IMAGING — CR DG SHOULDER 2+V*R*
1 series · 3 of 3 positions shown · non-contrast
Comparison: None.

CLINICAL DATA: Right shoulder pain

EXAM:
RIGHT SHOULDER - 2+ VIEW

[Series 1: dg shoulder right · 0.14mm/px · 3 of 3 slices shown]
[im 1/3]
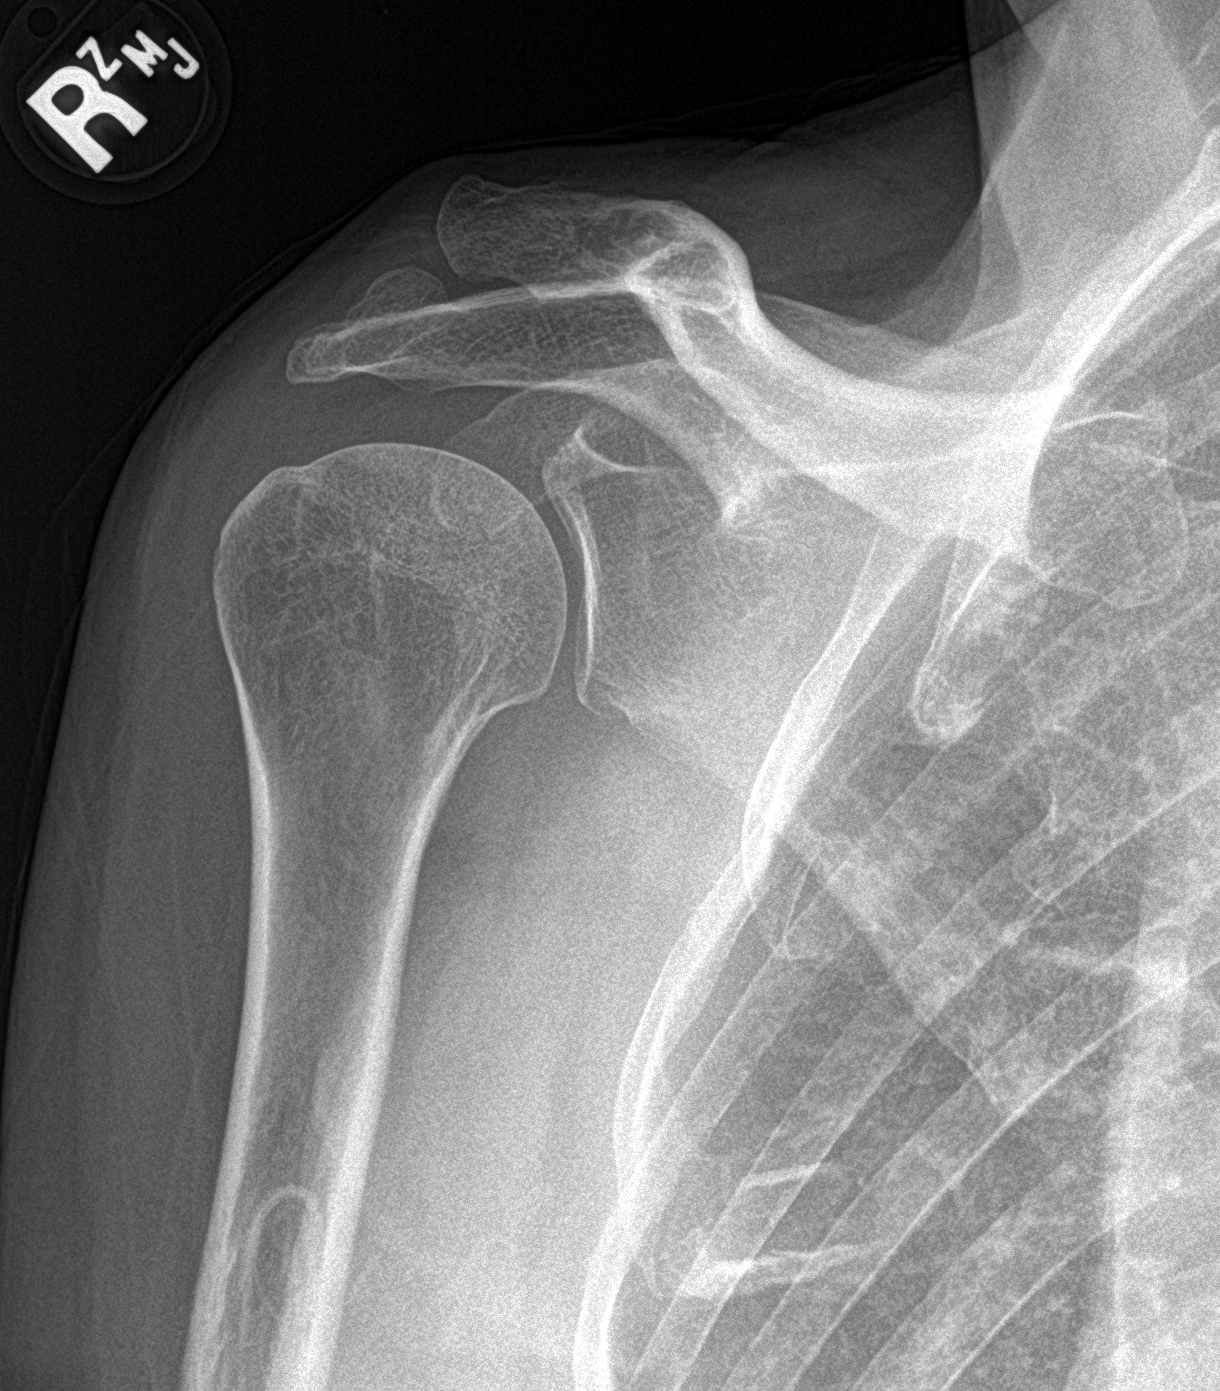
[im 2/3]
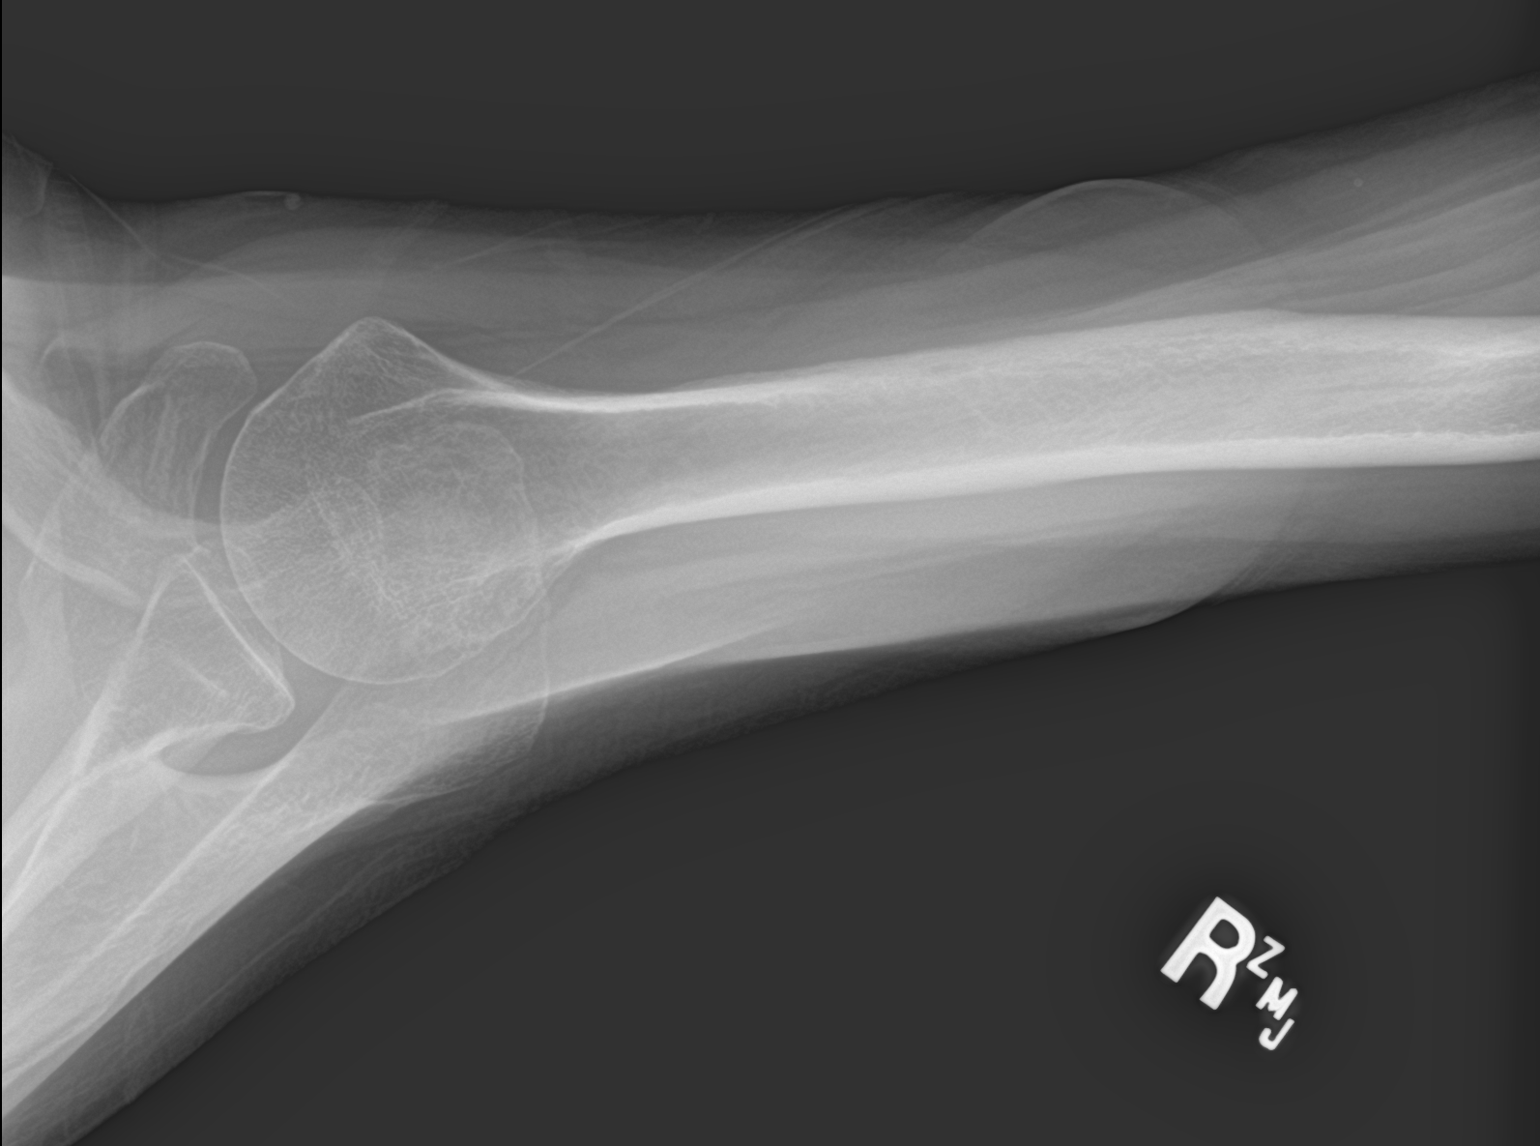
[im 3/3]
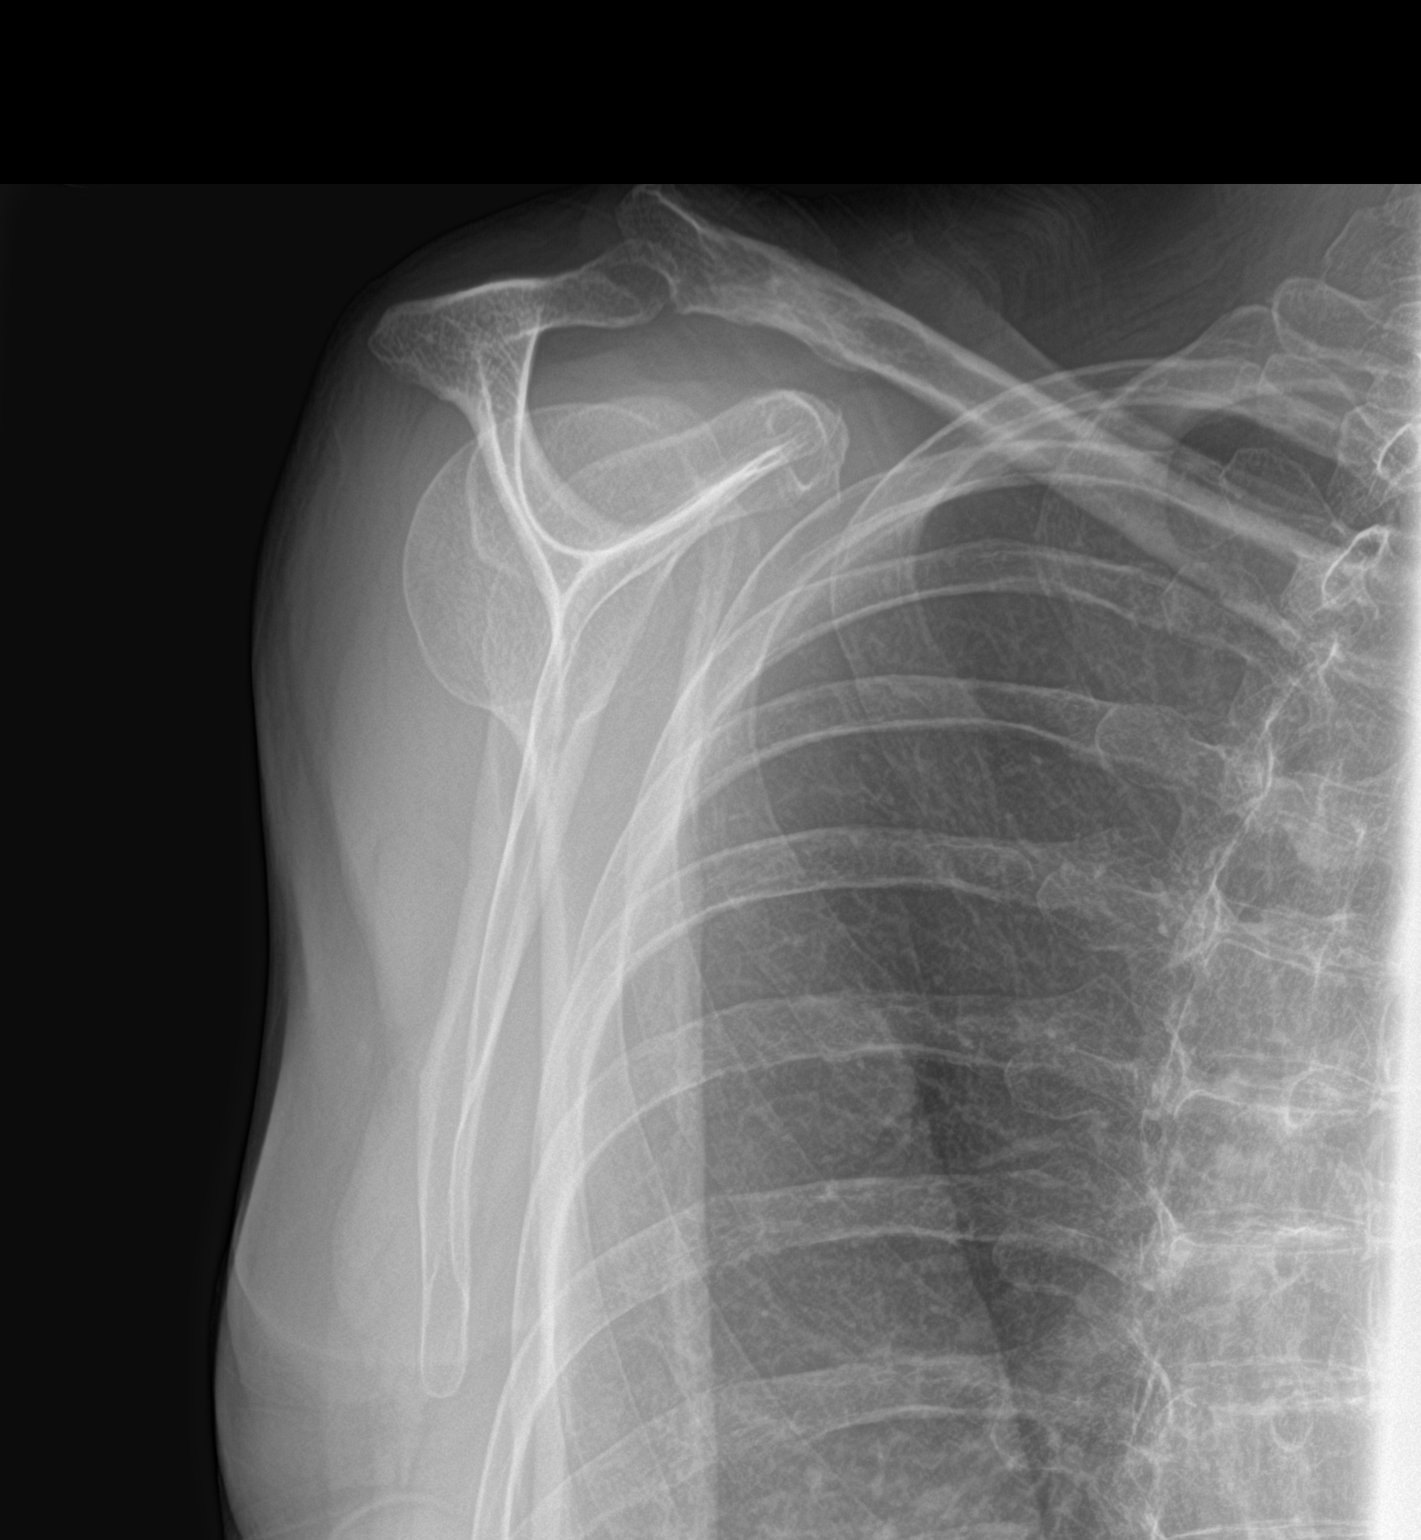

[3 of 3 positions shown; findings below may reference images not displayed]

FINDINGS: No acute fracture or dislocation identified. Mild narrowing of the
glenohumeral joint. No soft tissue abnormality visualized.
IMPRESSION: No acute osseous abnormality identified.

## 2023-07-28 DIAGNOSIS — I739 Peripheral vascular disease, unspecified: Secondary | ICD-10-CM | POA: Insufficient documentation

## 2023-07-28 DIAGNOSIS — E119 Type 2 diabetes mellitus without complications: Secondary | ICD-10-CM | POA: Diagnosis not present

## 2023-07-28 DIAGNOSIS — I10 Essential (primary) hypertension: Secondary | ICD-10-CM | POA: Diagnosis not present

## 2023-07-28 DIAGNOSIS — Z1389 Encounter for screening for other disorder: Secondary | ICD-10-CM | POA: Diagnosis not present

## 2023-07-28 DIAGNOSIS — Z0130 Encounter for examination of blood pressure without abnormal findings: Secondary | ICD-10-CM | POA: Diagnosis not present

## 2023-07-28 DIAGNOSIS — E1165 Type 2 diabetes mellitus with hyperglycemia: Secondary | ICD-10-CM | POA: Diagnosis not present

## 2023-07-28 DIAGNOSIS — Z712 Person consulting for explanation of examination or test findings: Secondary | ICD-10-CM | POA: Diagnosis not present

## 2023-07-28 DIAGNOSIS — Z0131 Encounter for examination of blood pressure with abnormal findings: Secondary | ICD-10-CM | POA: Diagnosis not present

## 2023-07-28 NOTE — Progress Notes (Deleted)
 Referring Physician:  Lorina Roosevelt, MD [address] [place] [address] [place],  [place] [address]  Primary Physician:  Lorina Roosevelt, MD  History of Present Illness: [name] has a history of HTN, CAD, and DM. Recent diagnosis of vertigo.   She had L1 kyphoplasty on 10/06/22. She was doing well after her procedure with no pain until a week later when she tripped over her cat on 10/11/22.   Last seen by me on 05/31/23 for follow up. She has known lumbar spondylosis with DDD, moderate central stenosis L4-L5 with left lateral recess stenosis and mild/moderate foraminal stenosis on left at L5-S1.    LBP is likely due to underlying spondylosis.   She has known neuropathy with new intermittent numbness in right leg in the calf and intermittent cramping in left leg. Numbness is worse with prolonged walking. She never had an EMG.   She was to discuss RFA with Dr. Rhesa Celeste, continue with lumbar PT, and get EMG done.   She had bilateral RFA L3-L5 on 07/19/23.         She has short term improvement with lumbar MBB. PT was ordered at her last visit. She was given note keeping her out of work.   She had repeat MBB L3-L5 by Dr. Rhesa Celeste on 05/03/23.   She declined PT. She was given flexeril  and referred to pain management. She was given note to keep her out of work until her follow up.   Dr. Rhesa Celeste sent her to PT and she had bilateral L3, L4, L5 MBB on 05/03/23. He also prescribed flexeril  for her.   She had good relief after her MBB for about a week or so. Pain is back to her baseline. She is back to her constant LBP with no leg pain. Pain is worse with bending, lifting, prolonged walking.   She has known neuropathy with intermittent numbness in right leg in the calf and intermittent cramping in left leg. Numbness is worse with prolonged walking.   She is taking neurontin . She has not been taking flexeril .   Bowel/Bladder Dysfunction: none  Conservative measures:  Physical therapy: nothing  recent Multimodal medical therapy including regular antiinflammatories: percocet   Injections:  bilateral RFA L3-L5 on 07/19/23 bilateral L3, L4, L5 MBB on 05/03/23 bilateral L3, L4, L5 MBB on 01/18/23  Past Surgery: L1 kyphoplasty on 10/06/22  Claudia Mills has no symptoms of cervical myelopathy.  The symptoms are causing a significant impact on the patient's life.   Review of Systems:  A 10 point review of systems is negative, except for the pertinent positives and negatives detailed in the HPI.  Past Medical History: Past Medical History:  Diagnosis Date   Diabetes mellitus without complication (HCC)    Hypertension    MI (myocardial infarction) (HCC)    Tick bite    Pt states she does not remember daye of tic bite, but that is was since last visit with pain clinic and she was hospitalized for 1 day.    Past Surgical History: Past Surgical History:  Procedure Laterality Date   CORONARY ANGIOPLASTY WITH STENT PLACEMENT     IR KYPHO LUMBAR INC FX REDUCE BONE BX UNI/BIL CANNULATION INC/IMAGING  10/06/2022   IR RADIOLOGIST EVAL & MGMT  09/20/2022   IR RADIOLOGIST EVAL & MGMT  10/13/2022   IR RADIOLOGIST EVAL & MGMT  10/20/2022    Allergies: Allergies as of 08/03/2023 - Review Complete 07/19/2023  Allergen Reaction Noted   Bee venom Anaphylaxis  04/07/2021    Medications: Outpatient Encounter Medications as of 08/03/2023  Medication Sig   aspirin  EC 81 MG tablet Take 81 mg by mouth daily. Swallow whole.   BRILINTA  90 MG TABS tablet Take 90 mg by mouth 2 (two) times daily. (Patient not taking: Reported on 07/19/2023)   cyclobenzaprine  (FLEXERIL ) 5 MG tablet Take 1 tablet (5 mg total) by mouth 3 (three) times daily as needed for muscle spasms.   DULoxetine  (CYMBALTA ) 30 MG capsule Take 30 mg by mouth daily.   Empagliflozin (JARDIANCE PO) Take 25 mg by mouth daily.   gabapentin  (NEURONTIN ) 300 MG capsule Take 300 mg by mouth 3 (three) times daily.   glipiZIDE  (GLUCOTROL ) 5 MG tablet  Take 1 tablet (5 mg total) by mouth daily before breakfast.   lisinopril  (ZESTRIL ) 20 MG tablet Take 1 tablet (20 mg total) by mouth daily.   nitroGLYCERIN  (NITROSTAT ) 0.3 MG SL tablet Place 0.3 mg under the tongue every 5 (five) minutes as needed for chest pain.   ondansetron  (ZOFRAN -ODT) 4 MG disintegrating tablet Take 1 tablet (4 mg total) by mouth every 8 (eight) hours as needed for nausea or vomiting.   pantoprazole  (PROTONIX ) 40 MG tablet Take 1 tablet (40 mg total) by mouth daily.   No facility-administered encounter medications on file as of 08/03/2023.    Social History: Social History   Tobacco Use   Smoking status: Never   Smokeless tobacco: Never  Vaping Use   Vaping status: Never Used  Substance Use Topics   Alcohol use: Not Currently    Alcohol/week: 12.0 standard drinks of alcohol    Types: 12 Cans of beer per week   Drug use: Yes    Types: Marijuana    Family Medical History: No family history on file.  Physical Examination: There were no vitals filed for this visit.      Awake, alert, oriented to person, place, and time.  Speech is clear and fluent. Fund of knowledge is appropriate.   Cranial Nerves: Pupils equal round and reactive to light.  Facial tone is symmetric.    She has diffuse mid to lower lumbar tenderness.   No abnormal lesions on exposed skin.   Strength: Side Iliopsoas Quads Hamstring PF DF EHL  R 5 5 5 5 5 5   L 5 5 5 5 5 5    Clonus is not present.   Bilateral lower extremity sensation is intact to light touch, slightly diminished in right leg.   Patella and achilles reflex is 2+ and symmetric bilaterally. No pain with IR/ER of both hips.   Gait is normal.     Medical Decision Making  Imaging: none  Assessment and Plan: [name] is a pleasant 53 y.o. female who had L1 kyphoplasty on 10/06/22. She was doing well after her procedure with no pain until a week later when she tripped over her cat on 10/11/22.   She had good relief  after her second MBB for about a week or so. She is back to her constant LBP with no leg pain.   She has known neuropathy with new intermittent numbness in right leg in the calf and intermittent cramping in left leg. Numbness is worse with prolonged walking. She never had an EMG.   Previous MRI showed lumbar spondylosis with DDD, moderate central stenosis L4-L5 with left lateral recess stenosis and mild/moderate foraminal stenosis on left at L5-S1.   Numbness in right leg is more suspicious for neuropathy, but spinal stenosis could be  contributing.   Treatment options discussed with patient and following plan made:   - Follow up with Dr. Rhesa Celeste as scheduled to discuss further RFA.  - Continue with PT for her lumbar spine.  - EMG/NCS of bilateral lower extremities to evaluate numbness in right leg. Orders to [facility] neurology.  - She does bending, twisting, and lifting at work (packs socks). Note keeping her out until follow up with me in 6 weeks. She will likely need FCE but will hold until after RFA.  - Will likely set up phone visit to review her EMG results.   BP was elevated. She has not taken her BP meds yet. No symptoms of chest pain, shortness of breath, blurry vision, or headaches. She will take her medications and recheck her BP at home. She will call PCP with any issues. If she develops CP, SOB, blurry vision, or headaches, then she will go to ED.     I spent a total of 20 minutes in face-to-face and non-face-to-face activities related to this patient's care today including review of outside records, review of imaging, review of symptoms, physical exam, discussion of differential diagnosis, discussion of treatment options, and documentation.   Lucetta Russel PA-C Dept. of Neurosurgery

## 2023-08-02 ENCOUNTER — Other Ambulatory Visit (INDEPENDENT_AMBULATORY_CARE_PROVIDER_SITE_OTHER): Payer: Self-pay | Admitting: Vascular Surgery

## 2023-08-02 DIAGNOSIS — I739 Peripheral vascular disease, unspecified: Secondary | ICD-10-CM

## 2023-08-03 ENCOUNTER — Telehealth (INDEPENDENT_AMBULATORY_CARE_PROVIDER_SITE_OTHER): Payer: Self-pay

## 2023-08-03 ENCOUNTER — Ambulatory Visit: Admitting: Orthopedic Surgery

## 2023-08-03 ENCOUNTER — Encounter (INDEPENDENT_AMBULATORY_CARE_PROVIDER_SITE_OTHER): Payer: Self-pay | Admitting: Vascular Surgery

## 2023-08-03 ENCOUNTER — Ambulatory Visit (INDEPENDENT_AMBULATORY_CARE_PROVIDER_SITE_OTHER): Payer: Self-pay | Admitting: Vascular Surgery

## 2023-08-03 ENCOUNTER — Ambulatory Visit (INDEPENDENT_AMBULATORY_CARE_PROVIDER_SITE_OTHER): Payer: Self-pay

## 2023-08-03 VITALS — BP 151/81 | HR 96 | Ht 63.0 in | Wt 132.8 lb

## 2023-08-03 DIAGNOSIS — E1151 Type 2 diabetes mellitus with diabetic peripheral angiopathy without gangrene: Secondary | ICD-10-CM

## 2023-08-03 DIAGNOSIS — K219 Gastro-esophageal reflux disease without esophagitis: Secondary | ICD-10-CM | POA: Insufficient documentation

## 2023-08-03 DIAGNOSIS — I214 Non-ST elevation (NSTEMI) myocardial infarction: Secondary | ICD-10-CM | POA: Diagnosis not present

## 2023-08-03 DIAGNOSIS — I70229 Atherosclerosis of native arteries of extremities with rest pain, unspecified extremity: Secondary | ICD-10-CM | POA: Insufficient documentation

## 2023-08-03 DIAGNOSIS — I10 Essential (primary) hypertension: Secondary | ICD-10-CM

## 2023-08-03 DIAGNOSIS — I70223 Atherosclerosis of native arteries of extremities with rest pain, bilateral legs: Secondary | ICD-10-CM | POA: Diagnosis not present

## 2023-08-03 DIAGNOSIS — I739 Peripheral vascular disease, unspecified: Secondary | ICD-10-CM

## 2023-08-03 DIAGNOSIS — E119 Type 2 diabetes mellitus without complications: Secondary | ICD-10-CM | POA: Insufficient documentation

## 2023-08-03 MED ORDER — OXYCODONE-ACETAMINOPHEN 5-325 MG PO TABS: 1.0000 | ORAL_TABLET | Freq: Three times a day (TID) | ORAL | 0 refills | Status: DC | PRN

## 2023-08-03 NOTE — Telephone Encounter (Signed)
 Spoke with the patient and she is scheduled with Dr. Prescilla Brod for 08/08/23 with a 1:00 pm arrival time to the [facility] for a left leg angio. Pre-procedure instructions were discussed and will be sent to Mychart and mailed.

## 2023-08-03 NOTE — Progress Notes (Signed)
 MRN : [id number]  Claudia Mills is a 53 y.o. (05-27-1970) female who presents with chief complaint of check circulation.  History of Present Illness:    The patient is seen for evaluation of painful lower extremities and diminished pulses.   The patient notes that there has been a significant deterioration in the lower extremity symptoms.  The patient notes shortening of their claudication distance and development of severe rest pain symptoms. No new ulcers or wounds have occurred since the last visit.  Small tick bite at the right ankle has been present for some time but does not appear to be getting worse although it has not healed.  Patient notes that she is no longer able to sleep because of the intensity of the pain.  She does get some relief from sitting up.  No prior interventions or surgeries.  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   There have been no significant changes to the patient's overall health care.  She does have a history of myocardial infarction.The patient denies recent episodes of angina or shortness of breath.   The patient denies amaurosis fugax or recent TIA symptoms. There are no recent neurological changes noted. There is no history of DVT, PE or superficial thrombophlebitis.  ABI's obtained today rt= 0.47 (monophasic) and Lt= 0.48 (monophasic).  TBI's Rt= 0.34 and Lt= 0.00 Limited duplex ultrasound at the time ABIs are obtained demonstrates bilateral SFA occlusions   Current Meds  Medication Sig   aspirin  EC 81 MG tablet Take 81 mg by mouth daily. Swallow whole.   cyclobenzaprine  (FLEXERIL ) 5 MG tablet Take 1 tablet (5 mg total) by mouth 3 (three) times daily as needed for muscle spasms.   DULoxetine  (CYMBALTA ) 30 MG capsule Take 30 mg by mouth daily.   Empagliflozin (JARDIANCE PO) Take 25 mg by mouth daily.   gabapentin  (NEURONTIN ) 300 MG capsule Take 300 mg by  mouth 3 (three) times daily.   glipiZIDE  (GLUCOTROL ) 5 MG tablet Take 1 tablet (5 mg total) by mouth daily before breakfast.   lisinopril  (ZESTRIL ) 20 MG tablet Take 1 tablet (20 mg total) by mouth daily.   nitroGLYCERIN  (NITROSTAT ) 0.3 MG SL tablet Place 0.3 mg under the tongue every 5 (five) minutes as needed for chest pain.   ondansetron  (ZOFRAN -ODT) 4 MG disintegrating tablet Take 1 tablet (4 mg total) by mouth every 8 (eight) hours as needed for nausea or vomiting.   pantoprazole  (PROTONIX ) 40 MG tablet Take 1 tablet (40 mg total) by mouth daily.    Past Medical History:  Diagnosis Date   Diabetes mellitus without complication (HCC)    Hypertension    MI (myocardial infarction) (HCC)    Tick bite    Pt states she does not remember daye of tic bite, but that is was since last visit with pain clinic and she was hospitalized for 1 day.    Past Surgical History:  Procedure Laterality Date   CORONARY ANGIOPLASTY WITH STENT PLACEMENT     IR KYPHO LUMBAR INC FX REDUCE BONE BX UNI/BIL CANNULATION INC/IMAGING  10/06/2022   IR RADIOLOGIST EVAL & MGMT  09/20/2022   IR RADIOLOGIST EVAL & MGMT  10/13/2022   IR RADIOLOGIST EVAL & MGMT  10/20/2022    Social History Social History   Tobacco Use   Smoking status: Never   Smokeless tobacco: Never  Vaping Use   Vaping status: Never Used  Substance Use Topics   Alcohol use: Not Currently    Alcohol/week: 12.0 standard drinks of alcohol    Types: 12 Cans of beer per week   Drug use: Yes    Types: Marijuana    Family History History reviewed. No pertinent family history.  Allergies  Allergen Reactions   Bee Venom Anaphylaxis     REVIEW OF SYSTEMS (Negative unless checked)  Constitutional: [] Weight loss  [] Fever  [] Chills Cardiac: [] Chest pain   [] Chest pressure   [] Palpitations   [] Shortness of breath when laying flat   [] Shortness of breath with exertion. Vascular:  [x] Pain in legs with walking   [] Pain in legs at rest  [] History of  DVT   [] Phlebitis   [] Swelling in legs   [] Varicose veins   [] Non-healing ulcers Pulmonary:   [] Uses home oxygen   [] Productive cough   [] Hemoptysis   [] Wheeze  [] COPD   [] Asthma Neurologic:  [] Dizziness   [] Seizures   [] History of stroke   [] History of TIA  [] Aphasia   [] Vissual changes   [] Weakness or numbness in arm   [] Weakness or numbness in leg Musculoskeletal:   [] Joint swelling   [] Joint pain   [] Low back pain Hematologic:  [] Easy bruising  [] Easy bleeding   [] Hypercoagulable state   [] Anemic Gastrointestinal:  [] Diarrhea   [] Vomiting  [] Gastroesophageal reflux/heartburn   [] Difficulty swallowing. Genitourinary:  [] Chronic kidney disease   [] Difficult urination  [] Frequent urination   [] Blood in urine Skin:  [] Rashes   [] Ulcers  Psychological:  [] History of anxiety   []  History of major depression.  Physical Examination  Vitals:   08/03/23 0824  BP: (!) 151/81  Pulse: 96  Weight: 132 lb 12.8 oz (60.2 kg)  Height: 5' 3 (1.6 m)   Body mass index is 23.52 kg/m. Gen: WD/WN, NAD Head: [place]/AT, No temporalis wasting.  Ear/Nose/Throat: Hearing grossly intact, nares w/o erythema or drainage Eyes: PER, EOMI, sclera nonicteric.  Neck: Supple, no masses.  No bruit or JVD.  Pulmonary:  Good air movement, no audible wheezing, no use of accessory muscles.  Cardiac: RRR, normal S1, S2, no Murmurs. Vascular: Severe trophic changes, small scabbed wound right ankle no evidence for infection.  No wounds of the left foot.  Both feet demonstrate severe dependent rubor with cyanosis of the toes on the left Vessel Right Left  Radial Palpable Palpable  PT Not Palpable Not Palpable  DP Not Palpable Not Palpable  Gastrointestinal: soft, non-distended. No guarding/no peritoneal signs.  Musculoskeletal: M/S 5/5 throughout.  No visible deformity.  Neurologic: CN 2-12 intact. Pain and light touch intact in extremities.  Symmetrical.  Speech is fluent. Motor exam as listed above. Psychiatric: Judgment  intact, Mood & affect appropriate for pt's clinical situation. Dermatologic: No rashes or ulcers noted.  No changes consistent with cellulitis.   CBC Lab Results  Component Value Date   WBC 11.9 (H) 10/21/2022   HGB 15.1 (H) 10/21/2022   HCT 42.0 10/21/2022   MCV 94.6 10/21/2022   PLT 397 10/21/2022    BMET    Component Value Date/Time   NA 124 (L) 10/21/2022 1505   K 3.1 (L) 10/21/2022  1505   CL 88 (L) 10/21/2022 1505   CO2 21 (L) 10/21/2022 1505   GLUCOSE 461 (H) 10/21/2022 1505   BUN 15 10/21/2022 1505   CREATININE 0.95 10/21/2022 1505   CREATININE 0.58 09/20/2022 0930   CALCIUM  9.1 10/21/2022 1505   GFRNONAA >60 10/21/2022 1505   GFRAA >60 12/30/2015 2048   CrCl cannot be calculated (Patient's most recent lab result is older than the maximum 21 days allowed.).  COAG Lab Results  Component Value Date   INR 0.9 09/20/2022    Radiology DG PAIN CLINIC C-ARM 1-60 MIN NO REPORT Result Date: 07/19/2023 Fluoro was used, but no Radiologist interpretation will be provided. Please refer to NOTES tab for provider progress note.    Assessment/Plan 1. 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  This represents a limb threatening ischemia and places the patient at the risk for bilateral limb loss.    Physical examination as well as noninvasive studies are consistent with this finding.  Limited duplex ultrasound has demonstrated SFA occlusions.  Patient should undergo angiography of the left lower extremity initially but then the right lower extremity will need treatment as well.  Angiography is with the hope for intervention for limb salvage.  The risks and benefits as well as the alternative  therapies was discussed in detail with the patient.  All questions were answered.  Patient agrees to proceed with left lower extremity angiography first and then right lower extremity angiography.  The patient will follow up with me in the office after the procedure.   I70.223    critical limb ischemia of the lower extremity I70.229    Atherosclerotic occlusive disease with rest pain  CPT codes: [address]   stent placement femoral-popliteal artery 36247   introduction catheter below diaphragm third order  2. NSTEMI (non-ST elevated myocardial infarction) (HCC) Continue cardiac and antihypertensive medications as already ordered and reviewed, no changes at this time.  Continue statin as ordered and reviewed, no changes at this time  Nitrates PRN for chest pain  3. Essential hypertension Continue antihypertensive medications as already ordered, these medications have been reviewed and there are no changes at this time.  4. Type 2 diabetes mellitus with diabetic peripheral angiopathy without gangrene, without long-term current use of insulin  (HCC) Continue hypoglycemic medications as already ordered, these medications have been reviewed and there are no changes at this time.  Hgb A1C to be monitored as already arranged by primary service  5. Gastroesophageal reflux disease without esophagitis Continue PPI as already ordered, this medication has been reviewed and there are no changes at this time.  Avoidence of caffeine and alcohol  Moderate elevation of the head of the bed     Devon Fogo, MD  08/03/2023 9:04 AM

## 2023-08-03 NOTE — H&P (View-Only) (Signed)
 MRN : [id number]  Claudia Mills is a 53 y.o. (05-27-1970) female who presents with chief complaint of check circulation.  History of Present Illness:    The patient is seen for evaluation of painful lower extremities and diminished pulses.   The patient notes that there has been a significant deterioration in the lower extremity symptoms.  The patient notes shortening of their claudication distance and development of severe rest pain symptoms. No new ulcers or wounds have occurred since the last visit.  Small tick bite at the right ankle has been present for some time but does not appear to be getting worse although it has not healed.  Patient notes that she is no longer able to sleep because of the intensity of the pain.  She does get some relief from sitting up.  No prior interventions or surgeries.  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   There have been no significant changes to the patient's overall health care.  She does have a history of myocardial infarction.The patient denies recent episodes of angina or shortness of breath.   The patient denies amaurosis fugax or recent TIA symptoms. There are no recent neurological changes noted. There is no history of DVT, PE or superficial thrombophlebitis.  ABI's obtained today rt= 0.47 (monophasic) and Lt= 0.48 (monophasic).  TBI's Rt= 0.34 and Lt= 0.00 Limited duplex ultrasound at the time ABIs are obtained demonstrates bilateral SFA occlusions   Current Meds  Medication Sig   aspirin  EC 81 MG tablet Take 81 mg by mouth daily. Swallow whole.   cyclobenzaprine  (FLEXERIL ) 5 MG tablet Take 1 tablet (5 mg total) by mouth 3 (three) times daily as needed for muscle spasms.   DULoxetine  (CYMBALTA ) 30 MG capsule Take 30 mg by mouth daily.   Empagliflozin (JARDIANCE PO) Take 25 mg by mouth daily.   gabapentin  (NEURONTIN ) 300 MG capsule Take 300 mg by  mouth 3 (three) times daily.   glipiZIDE  (GLUCOTROL ) 5 MG tablet Take 1 tablet (5 mg total) by mouth daily before breakfast.   lisinopril  (ZESTRIL ) 20 MG tablet Take 1 tablet (20 mg total) by mouth daily.   nitroGLYCERIN  (NITROSTAT ) 0.3 MG SL tablet Place 0.3 mg under the tongue every 5 (five) minutes as needed for chest pain.   ondansetron  (ZOFRAN -ODT) 4 MG disintegrating tablet Take 1 tablet (4 mg total) by mouth every 8 (eight) hours as needed for nausea or vomiting.   pantoprazole  (PROTONIX ) 40 MG tablet Take 1 tablet (40 mg total) by mouth daily.    Past Medical History:  Diagnosis Date   Diabetes mellitus without complication (HCC)    Hypertension    MI (myocardial infarction) (HCC)    Tick bite    Pt states she does not remember daye of tic bite, but that is was since last visit with pain clinic and she was hospitalized for 1 day.    Past Surgical History:  Procedure Laterality Date   CORONARY ANGIOPLASTY WITH STENT PLACEMENT     IR KYPHO LUMBAR INC FX REDUCE BONE BX UNI/BIL CANNULATION INC/IMAGING  10/06/2022   IR RADIOLOGIST EVAL & MGMT  09/20/2022   IR RADIOLOGIST EVAL & MGMT  10/13/2022   IR RADIOLOGIST EVAL & MGMT  10/20/2022    Social History Social History   Tobacco Use   Smoking status: Never   Smokeless tobacco: Never  Vaping Use   Vaping status: Never Used  Substance Use Topics   Alcohol use: Not Currently    Alcohol/week: 12.0 standard drinks of alcohol    Types: 12 Cans of beer per week   Drug use: Yes    Types: Marijuana    Family History History reviewed. No pertinent family history.  Allergies  Allergen Reactions   Bee Venom Anaphylaxis     REVIEW OF SYSTEMS (Negative unless checked)  Constitutional: [] Weight loss  [] Fever  [] Chills Cardiac: [] Chest pain   [] Chest pressure   [] Palpitations   [] Shortness of breath when laying flat   [] Shortness of breath with exertion. Vascular:  [x] Pain in legs with walking   [] Pain in legs at rest  [] History of  DVT   [] Phlebitis   [] Swelling in legs   [] Varicose veins   [] Non-healing ulcers Pulmonary:   [] Uses home oxygen   [] Productive cough   [] Hemoptysis   [] Wheeze  [] COPD   [] Asthma Neurologic:  [] Dizziness   [] Seizures   [] History of stroke   [] History of TIA  [] Aphasia   [] Vissual changes   [] Weakness or numbness in arm   [] Weakness or numbness in leg Musculoskeletal:   [] Joint swelling   [] Joint pain   [] Low back pain Hematologic:  [] Easy bruising  [] Easy bleeding   [] Hypercoagulable state   [] Anemic Gastrointestinal:  [] Diarrhea   [] Vomiting  [] Gastroesophageal reflux/heartburn   [] Difficulty swallowing. Genitourinary:  [] Chronic kidney disease   [] Difficult urination  [] Frequent urination   [] Blood in urine Skin:  [] Rashes   [] Ulcers  Psychological:  [] History of anxiety   []  History of major depression.  Physical Examination  Vitals:   08/03/23 0824  BP: (!) 151/81  Pulse: 96  Weight: 132 lb 12.8 oz (60.2 kg)  Height: 5' 3 (1.6 m)   Body mass index is 23.52 kg/m. Gen: WD/WN, NAD Head: [place]/AT, No temporalis wasting.  Ear/Nose/Throat: Hearing grossly intact, nares w/o erythema or drainage Eyes: PER, EOMI, sclera nonicteric.  Neck: Supple, no masses.  No bruit or JVD.  Pulmonary:  Good air movement, no audible wheezing, no use of accessory muscles.  Cardiac: RRR, normal S1, S2, no Murmurs. Vascular: Severe trophic changes, small scabbed wound right ankle no evidence for infection.  No wounds of the left foot.  Both feet demonstrate severe dependent rubor with cyanosis of the toes on the left Vessel Right Left  Radial Palpable Palpable  PT Not Palpable Not Palpable  DP Not Palpable Not Palpable  Gastrointestinal: soft, non-distended. No guarding/no peritoneal signs.  Musculoskeletal: M/S 5/5 throughout.  No visible deformity.  Neurologic: CN 2-12 intact. Pain and light touch intact in extremities.  Symmetrical.  Speech is fluent. Motor exam as listed above. Psychiatric: Judgment  intact, Mood & affect appropriate for pt's clinical situation. Dermatologic: No rashes or ulcers noted.  No changes consistent with cellulitis.   CBC Lab Results  Component Value Date   WBC 11.9 (H) 10/21/2022   HGB 15.1 (H) 10/21/2022   HCT 42.0 10/21/2022   MCV 94.6 10/21/2022   PLT 397 10/21/2022    BMET    Component Value Date/Time   NA 124 (L) 10/21/2022 1505   K 3.1 (L) 10/21/2022  1505   CL 88 (L) 10/21/2022 1505   CO2 21 (L) 10/21/2022 1505   GLUCOSE 461 (H) 10/21/2022 1505   BUN 15 10/21/2022 1505   CREATININE 0.95 10/21/2022 1505   CREATININE 0.58 09/20/2022 0930   CALCIUM  9.1 10/21/2022 1505   GFRNONAA >60 10/21/2022 1505   GFRAA >60 12/30/2015 2048   CrCl cannot be calculated (Patient's most recent lab result is older than the maximum 21 days allowed.).  COAG Lab Results  Component Value Date   INR 0.9 09/20/2022    Radiology DG PAIN CLINIC C-ARM 1-60 MIN NO REPORT Result Date: 07/19/2023 Fluoro was used, but no Radiologist interpretation will be provided. Please refer to NOTES tab for provider progress note.    Assessment/Plan 1. 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  This represents a limb threatening ischemia and places the patient at the risk for bilateral limb loss.    Physical examination as well as noninvasive studies are consistent with this finding.  Limited duplex ultrasound has demonstrated SFA occlusions.  Patient should undergo angiography of the left lower extremity initially but then the right lower extremity will need treatment as well.  Angiography is with the hope for intervention for limb salvage.  The risks and benefits as well as the alternative  therapies was discussed in detail with the patient.  All questions were answered.  Patient agrees to proceed with left lower extremity angiography first and then right lower extremity angiography.  The patient will follow up with me in the office after the procedure.   I70.223    critical limb ischemia of the lower extremity I70.229    Atherosclerotic occlusive disease with rest pain  CPT codes: 16109   stent placement femoral-popliteal artery 36247   introduction catheter below diaphragm third order  2. NSTEMI (non-ST elevated myocardial infarction) (HCC) Continue cardiac and antihypertensive medications as already ordered and reviewed, no changes at this time.  Continue statin as ordered and reviewed, no changes at this time  Nitrates PRN for chest pain  3. Essential hypertension Continue antihypertensive medications as already ordered, these medications have been reviewed and there are no changes at this time.  4. Type 2 diabetes mellitus with diabetic peripheral angiopathy without gangrene, without long-term current use of insulin  (HCC) Continue hypoglycemic medications as already ordered, these medications have been reviewed and there are no changes at this time.  Hgb A1C to be monitored as already arranged by primary service  5. Gastroesophageal reflux disease without esophagitis Continue PPI as already ordered, this medication has been reviewed and there are no changes at this time.  Avoidence of caffeine and alcohol  Moderate elevation of the head of the bed     Devon Fogo, MD  08/03/2023 9:04 AM

## 2023-08-04 LAB — VAS US ABI WITH/WO TBI
Left ABI: 0.48
Right ABI: 0.47

## 2023-08-04 NOTE — Progress Notes (Unsigned)
 Referring Physician:  No referring provider defined for this encounter.  Primary Physician:  Lorel Maxie LABOR, MD  History of Present Illness: Claudia Mills has a history of HTN, CAD, and DM. Recent diagnosis of vertigo.   She had L1 kyphoplasty on 10/06/22. She was doing well after her procedure with no pain until a week later when she tripped over her cat on 10/11/22.   Last seen by me on 05/31/23 for follow up. She has known lumbar spondylosis with DDD, moderate central stenosis L4-L5 with left lateral recess stenosis and mild/moderate foraminal stenosis on left at L5-S1.    LBP is likely due to underlying spondylosis.   She was to discuss RFA with Dr. Marcelino, continue with lumbar PT, and get EMG done.   She had bilateral RFA L3-L5 on 07/19/23. She also had recent vascular angioplasty and stents on 08/08/23.   She has good days and bad days. She has intermittent LBP along with intermittent weakness and tingling in her left leg. Symptoms in left leg have improved since vascular procedure. She has known neuropathy as well. Pain is worse with increased activity and better with rest. She is using a rollator that she borrowed and would like a prescription.   She did not start PT.   She is taking neurontin . She is out of flexeril .   Conservative measures:  Physical therapy: nothing recent Multimodal medical therapy including regular antiinflammatories: percocet   Injections:  bilateral RFA L3-L5 on 07/19/23 bilateral L3, L4, L5 MBB on 05/03/23 bilateral L3, L4, L5 MBB on 01/18/23  Past Surgery: L1 kyphoplasty on 10/06/22  Claudia Mills has no symptoms of cervical myelopathy.  The symptoms are causing a significant impact on the patient's life.   Review of Systems:  A 10 point review of systems is negative, except for the pertinent positives and negatives detailed in the HPI.  Past Medical History: Past Medical History:  Diagnosis Date   Diabetes mellitus without complication (HCC)     Hypertension    MI (myocardial infarction) (HCC)    Tick bite    Pt states she does not remember daye of tic bite, but that is was since last visit with pain clinic and she was hospitalized for 1 day.    Past Surgical History: Past Surgical History:  Procedure Laterality Date   CORONARY ANGIOPLASTY WITH STENT PLACEMENT     IR KYPHO LUMBAR INC FX REDUCE BONE BX UNI/BIL CANNULATION INC/IMAGING  10/06/2022   IR RADIOLOGIST EVAL & MGMT  09/20/2022   IR RADIOLOGIST EVAL & MGMT  10/13/2022   IR RADIOLOGIST EVAL & MGMT  10/20/2022   LOWER EXTREMITY ANGIOGRAPHY Left 08/08/2023   Procedure: Lower Extremity Angiography;  Surgeon: Jama Cordella MATSU, MD;  Location: [facility] IN[facility]ASIVE [facility];  Service: Cardiovascular;  Laterality: Left;   LOWER EXTREMITY INTERVENTION Left 08/08/2023   Procedure: LOWER EXTREMITY INTERVENTION;  Surgeon: Jama Cordella MATSU, MD;  Location: [facility] INVAS[facility] CV [facility];  Service: Cardiovascular;  Laterality: Left;    Allergies: Allergies as of 08/17/2023 - Review Complete 08/17/2023  Allergen Reaction Noted   Bee venom Anaphylaxis 04/07/2021    Medications: Outpatient Encounter Medications as of 08/17/2023  Medication Sig   aspirin  EC 81 MG tablet Take 81 mg by mouth daily. Swallow whole.   BRILINTA  90 MG TABS tablet Take 90 mg by mouth 2 (two) times daily.   DULoxetine  (CYMBALTA ) 30 MG capsule Take 30 mg by mouth daily.   Empagliflozin (JARDIANCE PO) Take 25 mg by  mouth daily.   gabapentin  (NEURONTIN ) 300 MG capsule Take 300 mg by mouth 3 (three) times daily.   nitroGLYCERIN  (NITROSTAT ) 0.3 MG SL tablet Place 0.3 mg under the tongue every 5 (five) minutes as needed for chest pain.   oxyCODONE -acetaminophen  (PERCOCET/ROXICET) 5-325 MG tablet Take 1 tablet by mouth every 8 (eight) hours as needed for severe pain (pain score 7-10) or moderate pain (pain score 4-6) (OK to take two before bed). OK to take two before bed   pantoprazole  (PROTONIX ) 40 MG tablet Take 1 tablet (40 mg total)  by mouth daily.   [DISCONTINUED] cyclobenzaprine  (FLEXERIL ) 5 MG tablet Take 1 tablet (5 mg total) by mouth 3 (three) times daily as needed for muscle spasms.   atorvastatin  (LIPITOR) 20 MG tablet Take 1 tablet (20 mg total) by mouth daily. (Patient not taking: Reported on 08/17/2023)   cyclobenzaprine  (FLEXERIL ) 5 MG tablet Take 1 tablet (5 mg total) by mouth 3 (three) times daily as needed for muscle spasms.   glipiZIDE  (GLUCOTROL ) 5 MG tablet Take 1 tablet (5 mg total) by mouth daily before breakfast. (Patient not taking: Reported on 08/17/2023)   lisinopril  (ZESTRIL ) 20 MG tablet Take 1 tablet (20 mg total) by mouth daily. (Patient not taking: Reported on 08/17/2023)   [DISCONTINUED] ondansetron  (ZOFRAN -ODT) 4 MG disintegrating tablet Take 1 tablet (4 mg total) by mouth every 8 (eight) hours as needed for nausea or vomiting.   No facility-administered encounter medications on file as of 08/17/2023.    Social History: Social History   Tobacco Use   Smoking status: Never   Smokeless tobacco: Never  Vaping Use   Vaping status: Never Used  Substance Use Topics   Alcohol use: Yes    Alcohol/week: 12.0 standard drinks of alcohol    Types: 12 Cans of beer per week   Drug use: Yes    Types: Marijuana    Comment: Patient states, I had some this morning.    Family Medical History: No family history on file.  Physical Examination: Vitals:   08/17/23 1309  BP: 136/78      Awake, alert, oriented to person, place, and time.  Speech is clear and fluent. Fund of knowledge is appropriate.   Cranial Nerves: Pupils equal round and reactive to light.  Facial tone is symmetric.    She has no lower lumbar tenderness.   No abnormal lesions on exposed skin.   Strength: Side Iliopsoas Quads Hamstring PF DF EHL  R 5 5 5 5 5 5   L 5 5 5 5 5 5    Clonus is not present.   Bilateral lower extremity sensation is intact to light touch, slightly diminished in right leg.   Patella and achilles reflex  is 2+ and symmetric bilaterally.   She ambulates with a rollator.    Medical Decision Making  Imaging: none  Assessment and Plan: Claudia Mills is a pleasant 53 y.o. female who had L1 kyphoplasty on 10/06/22. She was doing well after her procedure with no pain until a week later when she tripped over her cat on 10/11/22.   Overall, she looks better than she has previously. She has good days and bad days. She has intermittent LBP along with intermittent weakness and tingling in her left leg. Symptoms in left leg have improved since vascular procedure.    Previous MRI showed lumbar spondylosis with DDD, moderate central stenosis L4-L5 with left lateral recess stenosis and mild/moderate foraminal stenosis on left at L5-S1.   Treatment options  discussed with patient and following plan made:   - Prescription given for rollator.  - Hold on EMG. A lot of her lower extremity symptoms may have been more vascular mediated.  - She does bending, twisting, and lifting at work (packs socks). Note keeping her out until follow up with me in 6 weeks.  - She will discuss with vascular and pain management regarding further work restrictions.  - From my standpoint, she will need an FCE if she cannot return back to work at next visit.  - Refill of flexeril  given. Reviewed dosing and side effects.   I spent a total of 25 minutes in face-to-face and non-face-to-face activities related to this patient's care today including review of outside records, review of imaging, review of symptoms, physical exam, discussion of differential diagnosis, discussion of treatment options, and documentation.   Glade Boys PA-C Dept. of Neurosurgery

## 2023-08-08 ENCOUNTER — Other Ambulatory Visit: Payer: Self-pay

## 2023-08-08 ENCOUNTER — Ambulatory Visit
Admission: RE | Admit: 2023-08-08 | Discharge: 2023-08-08 | Disposition: A | Discharge status: Home / Self Care | Attending: Vascular Surgery | Admitting: Vascular Surgery

## 2023-08-08 ENCOUNTER — Encounter
Admission: RE | Disposition: A | Discharge status: Home / Self Care | Payer: Self-pay | Source: Home / Self Care | Attending: Vascular Surgery

## 2023-08-08 ENCOUNTER — Encounter: Payer: Self-pay | Admitting: Vascular Surgery

## 2023-08-08 DIAGNOSIS — I70229 Atherosclerosis of native arteries of extremities with rest pain, unspecified extremity: Secondary | ICD-10-CM

## 2023-08-08 DIAGNOSIS — I70222 Atherosclerosis of native arteries of extremities with rest pain, left leg: Secondary | ICD-10-CM

## 2023-08-08 DIAGNOSIS — I10 Essential (primary) hypertension: Secondary | ICD-10-CM | POA: Diagnosis not present

## 2023-08-08 DIAGNOSIS — Z7984 Long term (current) use of oral hypoglycemic drugs: Secondary | ICD-10-CM | POA: Insufficient documentation

## 2023-08-08 DIAGNOSIS — E1151 Type 2 diabetes mellitus with diabetic peripheral angiopathy without gangrene: Secondary | ICD-10-CM | POA: Insufficient documentation

## 2023-08-08 DIAGNOSIS — I252 Old myocardial infarction: Secondary | ICD-10-CM | POA: Insufficient documentation

## 2023-08-08 DIAGNOSIS — K219 Gastro-esophageal reflux disease without esophagitis: Secondary | ICD-10-CM | POA: Insufficient documentation

## 2023-08-08 DIAGNOSIS — Z79899 Other long term (current) drug therapy: Secondary | ICD-10-CM | POA: Diagnosis not present

## 2023-08-08 DIAGNOSIS — I70201 Unspecified atherosclerosis of native arteries of extremities, right leg: Secondary | ICD-10-CM | POA: Diagnosis not present

## 2023-08-08 HISTORY — PX: LOWER EXTREMITY INTERVENTION: CATH118252

## 2023-08-08 HISTORY — PX: LOWER EXTREMITY ANGIOGRAPHY: CATH118251

## 2023-08-08 LAB — CREATININE, SERUM
Creatinine, Ser: 0.63 mg/dL (ref 0.44–1.00)
GFR, Estimated: >60 mL/min (ref >60)

## 2023-08-08 LAB — GLUCOSE, CAPILLARY
Glucose-Capillary: 187 mg/dL — ABNORMAL HIGH (ref 70–99)
Glucose-Capillary: 173 mg/dL — ABNORMAL HIGH (ref 70–99)

## 2023-08-08 LAB — BUN: BUN: 9 mg/dL (ref 6–20)

## 2023-08-08 SURGERY — LOWER EXTREMITY INTERVENTION
Anesthesia: Moderate Sedation | Site: Leg Lower | Laterality: Left

## 2023-08-08 MED ORDER — FENTANYL CITRATE (PF) 100 MCG/2ML IJ SOLN
INTRAMUSCULAR | Status: AC
  Filled 2023-08-08: qty 2

## 2023-08-08 MED ORDER — ONDANSETRON HCL 4 MG/2ML IJ SOLN: 4.0000 mg | Freq: Four times a day (QID) | INTRAMUSCULAR | Status: DC | PRN

## 2023-08-08 MED ORDER — HYDROMORPHONE HCL 1 MG/ML IJ SOLN: 1.0000 mg | Freq: Once | INTRAMUSCULAR | Status: DC | PRN

## 2023-08-08 MED ORDER — MIDAZOLAM HCL 2 MG/ML PO SYRP: 8.0000 mg | ORAL_SOLUTION | Freq: Once | ORAL | Status: DC | PRN

## 2023-08-08 MED ORDER — ATORVASTATIN CALCIUM 20 MG PO TABS: 20.0000 mg | ORAL_TABLET | Freq: Every day | ORAL | 4 refills | Status: DC

## 2023-08-08 MED ORDER — METHYLPREDNISOLONE SODIUM SUCC 125 MG IJ SOLR: 125.0000 mg | Freq: Once | INTRAMUSCULAR | Status: DC | PRN

## 2023-08-08 MED ORDER — HEPARIN SODIUM (PORCINE) 1000 UNIT/ML IJ SOLN
INTRAMUSCULAR | Status: DC | PRN
  Administered 2023-08-08: 5000 [IU] via INTRAVENOUS

## 2023-08-08 MED ORDER — CEFAZOLIN SODIUM-DEXTROSE 2-4 GM/100ML-% IV SOLN
INTRAVENOUS | Status: AC
  Filled 2023-08-08: qty 100

## 2023-08-08 MED ORDER — SODIUM CHLORIDE 0.9 % IV SOLN: INTRAVENOUS | Status: DC

## 2023-08-08 MED ORDER — CEFAZOLIN SODIUM-DEXTROSE 2-4 GM/100ML-% IV SOLN
2.0000 g | INTRAVENOUS | Status: AC
  Administered 2023-08-08: 2 g via INTRAVENOUS

## 2023-08-08 MED ORDER — MIDAZOLAM HCL 2 MG/2ML IJ SOLN
INTRAMUSCULAR | Status: DC | PRN
  Administered 2023-08-08: .5 mg via INTRAVENOUS
  Administered 2023-08-08: 2 mg via INTRAVENOUS
  Administered 2023-08-08: .5 mg via INTRAVENOUS
  Administered 2023-08-08: 1 mg via INTRAVENOUS

## 2023-08-08 MED ORDER — MIDAZOLAM HCL 5 MG/5ML IJ SOLN
INTRAMUSCULAR | Status: AC
  Filled 2023-08-08: qty 5

## 2023-08-08 MED ORDER — LIDOCAINE HCL (PF) 1 % IJ SOLN
INTRAMUSCULAR | Status: DC | PRN
Start: 2023-08-08 — End: 2023-08-08
  Administered 2023-08-08: 10 mL

## 2023-08-08 MED ORDER — NITROGLYCERIN 1 MG/10 ML FOR IR/CATH LAB
INTRA_ARTERIAL | Status: AC
Start: 2023-08-08 — End: 2023-08-08
  Filled 2023-08-08: qty 10

## 2023-08-08 MED ORDER — FAMOTIDINE 20 MG PO TABS: 40.0000 mg | ORAL_TABLET | Freq: Once | ORAL | Status: DC | PRN

## 2023-08-08 MED ORDER — FENTANYL CITRATE (PF) 100 MCG/2ML IJ SOLN
INTRAMUSCULAR | Status: DC | PRN
  Administered 2023-08-08: 50 ug via INTRAVENOUS
  Administered 2023-08-08 (×3): 12.5 ug via INTRAVENOUS

## 2023-08-08 MED ORDER — HEPARIN (PORCINE) IN NACL 1000-0.9 UT/500ML-% IV SOLN
INTRAVENOUS | Status: DC | PRN
  Administered 2023-08-08: 1000 mL

## 2023-08-08 MED ORDER — DIPHENHYDRAMINE HCL 50 MG/ML IJ SOLN: 50.0000 mg | Freq: Once | INTRAMUSCULAR | Status: DC | PRN

## 2023-08-08 MED ORDER — NITROGLYCERIN 1 MG/10 ML FOR IR/CATH LAB
INTRA_ARTERIAL | Status: DC | PRN
  Administered 2023-08-08: 200 ug via INTRA_ARTERIAL

## 2023-08-08 MED ORDER — HEPARIN SODIUM (PORCINE) 1000 UNIT/ML IJ SOLN
INTRAMUSCULAR | Status: AC
Start: 2023-08-08 — End: 2023-08-08
  Filled 2023-08-08: qty 10

## 2023-08-08 MED ORDER — IODIXANOL 320 MG/ML IV SOLN
INTRAVENOUS | Status: DC | PRN
  Administered 2023-08-08: 80 mL

## 2023-08-08 SURGICAL SUPPLY — 28 items
BALLOON LUTONIX 4X220X130 (BALLOONS) IMPLANT
BALLOON LUTONIX DCB 4X40X130 (BALLOONS) IMPLANT
BALLOON LUTONIX DCB 5X40X130 (BALLOONS) IMPLANT
BALLOON LUTONIX DCB 6X40X130 (BALLOONS) IMPLANT
BALLOON ULTRVRSE 3X200X130 (BALLOONS) IMPLANT
CATH ANGIO 5F PIGTAIL 65CM (CATHETERS) IMPLANT
CATH BEACON 5 .035 65 KMP TIP (CATHETERS) IMPLANT
CATH BEACON TIP VERT 5FR 125 (CATHETERS) IMPLANT
COVER PROBE ULTRASOUND 5X96 (MISCELLANEOUS) IMPLANT
DEVICE PRESTO INFLATION (MISCELLANEOUS) IMPLANT
DEVICE TORQUE (MISCELLANEOUS) IMPLANT
DEVICE VASC CLSR CELT ART 6 (Vascular Products) IMPLANT
GLIDEWIRE ADV .035X260CM (WIRE) IMPLANT
GOWN STRL REUS W/ TWL LRG LVL3 (GOWN DISPOSABLE) ×1 IMPLANT
NDL ENTRY 21GA 7CM ECHOTIP (NEEDLE) IMPLANT
NEEDLE ENTRY 21GA 7CM ECHOTIP (NEEDLE) ×1 IMPLANT
PACK ANGIOGRAPHY (CUSTOM PROCEDURE TRAY) ×1 IMPLANT
SET INTRO CAPELLA COAXIAL (SET/KITS/TRAYS/PACK) IMPLANT
SHEATH ANL2 6FRX45 HC (SHEATH) IMPLANT
SHEATH BRITE TIP 5FRX11 (SHEATH) IMPLANT
SHEATH BRITE TIP 6FRX11 (SHEATH) IMPLANT
STENT LIFESTENT 5F 5X100X135 (Permanent Stent) IMPLANT
STENT LIFESTENT 5F 5X170X135 (Permanent Stent) IMPLANT
STENT LIFESTENT 5F 7X40X135 (Permanent Stent) IMPLANT
SYR MEDRAD MARK 7 150ML (SYRINGE) IMPLANT
TUBING CONTRAST HIGH PRESS 72 (TUBING) IMPLANT
WIRE J 3MM .035X145CM (WIRE) IMPLANT
WIRE NITINOL .018 (WIRE) IMPLANT

## 2023-08-08 NOTE — Interval H&P Note (Signed)
 History and Physical Interval Note:  08/08/2023 1:43 PM  Claudia Mills  has presented today for surgery, with the diagnosis of LLE Angio    ASO w rest pain.  The various methods of treatment have been discussed with the patient and family. After consideration of risks, benefits and other options for treatment, the patient has consented to  Procedure(s): LOWER EXTREMITY INTERVENTION (Left) Lower Extremity Angiography (Left) as a surgical intervention.  The patient's history has been reviewed, patient examined, no change in status, stable for surgery.  I have reviewed the patient's chart and labs.  Questions were answered to the patient's satisfaction.     [name]

## 2023-08-08 NOTE — Op Note (Signed)
 [facility]  Percutaneous Study/Intervention Procedural Note   Date of Surgery: 08/08/2023  Surgeon:  Claudia JUDITHANN Shawl, MD.  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  Introduction catheter into left lower extremity 3rd order catheter placement              2.    Contrast injection third lower extremity for distal runoff             3.  Percutaneous transluminal angioplasty  and stent placement superficial femoral artery              4.  Percutaneous transluminal angioplasty and stent placement left external iliac artery.                5.  Celt closure right common femoral arteriotomy  Anesthesia: Conscious sedation was administered under my direct supervision by the interventional radiology RN. IV Versed  plus fentanyl  were utilized. Continuous ECG, pulse oximetry and blood pressure was monitored throughout the entire procedure.  Conscious sedation was for a total of 72 minutes.  Sheath: 6 [place] Ansell right common femoral retrograde  Contrast: 80 cc  Fluoroscopy Time: 11.1 minutes  Indications:  Claudia Mills presents with severe rest pain of the left lower extremity.  Noninvasive studies as well as physical examination support this.  This places her at high risk for limb loss.  Angiography with hope for intervention for limb salvage is recommended.  The risks and benefits are reviewed all questions answered patient agrees to proceed.  Procedure:  Claudia Mills is a 53 y.o. y.o. female who was identified and appropriate procedural time out was performed.  The patient was then placed supine on the table and prepped and draped in the usual sterile fashion.    Ultrasound was placed in the sterile sleeve and the right groin was evaluated the right common femoral artery was echolucent and pulsatile  indicating patency.  Image was recorded for the permanent record and under real-time visualization a microneedle was inserted into the common femoral artery microwire followed by a micro-sheath.  A J-wire was then advanced through the micro-sheath and a  5 [place] sheath was then inserted over a J-wire. J-wire was then advanced and a 5 French pigtail catheter was positioned at the level of T12. AP projection of the aorta was then obtained. Pigtail catheter was repositioned to above the bifurcation and a RAO view of the pelvis was obtained.  Subsequently a pigtail catheter with the stiff angle Glidewire was used to cross the aortic bifurcation the catheter wire were advanced down into the left distal external iliac artery. Oblique view of the femoral bifurcation was then obtained and subsequently the wire was reintroduced and the pigtail catheter negotiated into the SFA representing third order catheter placement. Distal runoff was then performed.  5000 units of heparin  was then given and allowed to circulate and a 6 [place] Ansell sheath was advanced up and over the bifurcation and positioned in the proximal external iliac artery.  Magnified imaging was then performed and a 7 mm x 40 mm life stent was deployed across this lesion.  It was postdilated with a 6 mm x 40 mm Lutonix drug-eluting balloon inflated to 8 atm for approximately 1 minute.  Follow-up imaging demonstrated less than 10% residual stenosis.  KMP  catheter and  advantage Glidewire were then negotiated through the SFA occlusion.  I was able to reenter just distal to Hunter's canal and then verified this with hand-injection.  I then advanced down into the distal popliteal.  The sheath distal runoff was then completed by hand injection through the catheter. The wire was then reintroduced and a 3 mm x 300 mm Ultraverse balloon was used to angioplasty the superficial femoral and popliteal arteries. Inflation was to 8 atmospheres for 1 minute.  Next I  deployed a 5 mm x 150 mm life stent with its distal edge in the above-knee popliteal.  A second 5 mm x 100 mm life stent was then deployed extending proximally.  These 2 stents were then postdilated with 4 mm Lutonix drug-eluting balloons inflated to 8 to 10 atm for approximately 1 minute.  This 4 mm inflation also extended across the origin of the SFA.  Follow-up imaging now demonstrated approximately 30 to 40% residual stenosis at the origin but otherwise the stents appear widely patent with less than 10% residual stenosis.  I selected a 5 mm x 40 mm balloon and treated the origin inflation with an inflation to 10 atm for 2 full minutes.  Follow-up imaging demonstrated less than 20% residual stenosis at the origin of the SFA.  Distally in the above-knee popliteal there appeared to be some narrowing based on previous images I believe this could be spasm and 200 mcg of nitroglycerin  were given intra-arterially.  Next I postdilated the distal edge of the SFA stent with a 4 mm balloon and lastly the mid popliteal was dilated with a 3 mm balloon inflation.  Follow-up imaging demonstrated patency of the SFA from its origin through the entire length of the popliteal with less than 20% residual stenosis proximally and less than 10% residual stenosis throughout the remaining portion of the arterial system.  Distal runoff was preserved.    After review of these images the sheath is pulled into the right external iliac oblique of the common femoral is obtained and a Celt device deployed. There no immediate complications.   Findings:  The abdominal aorta is opacified with a bolus injection contrast. Renal arteries are single and widely patent. The aorta itself has diffuse disease but no hemodynamically significant lesions. The common iliac arteries are widely patent bilaterally the right external iliac artery is widely patent.  The left external iliac artery demonstrates greater than 80% stenosis in its distal 3 cm down  to just proximal to the circumflex arteries.  The left common femoral is widely patent as is the profunda femoris.  The SFA does indeed occlude.  The above-knee popliteal is reconstituted at Hunter's canal.  Popliteal is very small but otherwise patent throughout its course.  The trifurcation is patent.  The anterior tibial occludes shortly after its origin and then reconstitutes after short segment occlusion and is otherwise widely patent down to the foot filling the dorsalis pedis.  The tibioperoneal trunk and peroneal are widely patent down to the ankle and collateralizes quite well to the level plantar arteries.  The posterior tibial is occluded at its origin and remains occluded throughout its entire course.    Following angioplasty and stent placement the right external iliac artery is widely patent with less than 10% residual stenosis.  Following angioplasty and stent placement of the SFA and above-knee popliteal there is a 20% residual stenosis which is focal and right at the origin of the SFA.  The remaining portion of the SFA is widely  patent filling the popliteal which is widely patent the tibioperoneal trunk and peroneal are patent and unchanged from angio the anterior tibial is unchanged with a short segment proximal occlusion.     Summary: Successful recanalization left lower extremity for limb salvage                        Disposition: Patient was taken to the recovery room in stable condition having tolerated the procedure well.  Claudia Mills, Claudia MATSU 08/08/2023,4:59 PM

## 2023-08-09 ENCOUNTER — Encounter: Payer: Self-pay | Admitting: Vascular Surgery

## 2023-08-10 ENCOUNTER — Encounter: Admitting: Neurology

## 2023-08-15 ENCOUNTER — Telehealth (INDEPENDENT_AMBULATORY_CARE_PROVIDER_SITE_OTHER): Payer: Self-pay

## 2023-08-15 DIAGNOSIS — F331 Major depressive disorder, recurrent, moderate: Secondary | ICD-10-CM | POA: Diagnosis not present

## 2023-08-15 NOTE — Telephone Encounter (Signed)
 Patient left a message for a return call regarding going swimming as she has had a leg angio with Dr. Jama on 08/08/23. I attempted to contact and a message was left for a return call.

## 2023-08-16 NOTE — Telephone Encounter (Signed)
 Patient was given the recommendation from Orvin Daring NP, patient was fine with the information.

## 2023-08-16 NOTE — Telephone Encounter (Signed)
 She can as long as her groin wound site has healed

## 2023-08-16 NOTE — Telephone Encounter (Signed)
 Patient has called back and is still wanting to know when she could go swimming. Please advise.

## 2023-08-17 ENCOUNTER — Encounter: Payer: Self-pay | Admitting: Orthopedic Surgery

## 2023-08-17 ENCOUNTER — Ambulatory Visit: Admitting: Orthopedic Surgery

## 2023-08-17 DIAGNOSIS — M48061 Spinal stenosis, lumbar region without neurogenic claudication: Secondary | ICD-10-CM | POA: Diagnosis not present

## 2023-08-17 DIAGNOSIS — M5136 Other intervertebral disc degeneration, lumbar region with discogenic back pain only: Secondary | ICD-10-CM | POA: Diagnosis not present

## 2023-08-17 DIAGNOSIS — M47816 Spondylosis without myelopathy or radiculopathy, lumbar region: Secondary | ICD-10-CM

## 2023-08-17 MED ORDER — CYCLOBENZAPRINE HCL 5 MG PO TABS: 5.0000 mg | ORAL_TABLET | Freq: Three times a day (TID) | ORAL | 0 refills | Status: DC | PRN

## 2023-08-17 NOTE — Patient Instructions (Signed)
 It was so nice to see you today. Thank you so much for coming in.    I am glad you are feeling better!  I gave you a prescription for a rollator.   Follow up with vascular as scheduled- see if they have any work restrictions for you.   Let's hold on nerve test for now.   Follow up with Dr. Marcelino as scheduled.   I sent a prescription for flexeril  to help with muscle spasms. Use only as needed and be careful, this can make you sleepy.   I will keep you out of work for 6 more weeks. At next visit, will need to discuss getting FCE to determine any further restrictions (unless vascular needs to keep you out of work). May want to see if vascular or pain management thinks you need to be out of work.   I will see you back in 6 weeks. Please do not hesitate to call if you have any questions or concerns. You can also message me in MyChart.   Glade Boys PA-C [phone]     The physicians and staff at [facility] at [facility] are committed to providing excellent care. You may receive a survey asking for feedback about your experience at our office. We value you your feedback and appreciate you taking the time to to fill it out. The [facility] leadership team is also available to discuss your experience in person, feel free to contact us  [phone].

## 2023-08-23 DIAGNOSIS — I739 Peripheral vascular disease, unspecified: Secondary | ICD-10-CM | POA: Diagnosis not present

## 2023-08-23 DIAGNOSIS — Z1389 Encounter for screening for other disorder: Secondary | ICD-10-CM | POA: Diagnosis not present

## 2023-08-23 DIAGNOSIS — F331 Major depressive disorder, recurrent, moderate: Secondary | ICD-10-CM | POA: Diagnosis not present

## 2023-08-23 DIAGNOSIS — Z0131 Encounter for examination of blood pressure with abnormal findings: Secondary | ICD-10-CM | POA: Diagnosis not present

## 2023-08-23 DIAGNOSIS — Z712 Person consulting for explanation of examination or test findings: Secondary | ICD-10-CM | POA: Diagnosis not present

## 2023-08-23 DIAGNOSIS — E1140 Type 2 diabetes mellitus with diabetic neuropathy, unspecified: Secondary | ICD-10-CM | POA: Diagnosis not present

## 2023-08-23 DIAGNOSIS — Z0130 Encounter for examination of blood pressure without abnormal findings: Secondary | ICD-10-CM | POA: Diagnosis not present

## 2023-08-24 ENCOUNTER — Other Ambulatory Visit: Payer: Self-pay | Admitting: Family Medicine

## 2023-08-24 DIAGNOSIS — Z1231 Encounter for screening mammogram for malignant neoplasm of breast: Secondary | ICD-10-CM

## 2023-08-25 ENCOUNTER — Other Ambulatory Visit (INDEPENDENT_AMBULATORY_CARE_PROVIDER_SITE_OTHER): Payer: Self-pay | Admitting: Vascular Surgery

## 2023-08-25 DIAGNOSIS — Z9889 Other specified postprocedural states: Secondary | ICD-10-CM

## 2023-08-28 ENCOUNTER — Telehealth: Payer: Self-pay

## 2023-08-28 ENCOUNTER — Other Ambulatory Visit: Payer: Self-pay

## 2023-08-28 DIAGNOSIS — Z1211 Encounter for screening for malignant neoplasm of colon: Secondary | ICD-10-CM

## 2023-08-28 MED ORDER — NA SULFATE-K SULFATE-MG SULF 17.5-3.13-1.6 GM/177ML PO SOLN: 1.0000 | Freq: Once | ORAL | 0 refills | Status: AC

## 2023-08-28 NOTE — Telephone Encounter (Signed)
 Gastroenterology Pre-Procedure Review  Request Date: 11/28/23 Requesting Physician: Dr. Jinny  PATIENT REVIEW QUESTIONS: The patient responded to the following health history questions as indicated:    1. Are you having any GI issues? no 2. Do you have a personal history of Polyps? no 3. Do you have a family history of Colon Cancer or Polyps? no 4. Diabetes Mellitus? yes (takes glipizide  and jardiance has been advised to stop glipizide  1 day prior and stop jardiance 3 days prior) 5. Joint replacements in the past 12 months?no 6. Major health problems in the past 3 months?procedure with Dr. Jama vascular catherization clearance sent 7. Any artificial heart valves, MVP, or defibrillator?no    MEDICATIONS & ALLERGIES:    Patient reports the following regarding taking any anticoagulation/antiplatelet therapy:   Plavix, Coumadin, Eliquis, Xarelto, Lovenox, Pradaxa, Brilinta , or Effient? yes (Brilinta  prescribed by POP message sent) Aspirin ? yes (81 mg daily)  Patient confirms/reports the following medications:  Current Outpatient Medications  Medication Sig Dispense Refill   Na Sulfate-K Sulfate-Mg Sulfate concentrate (SUPREP) 17.5-3.13-1.6 GM/177ML SOLN Take 1 kit (354 mLs total) by mouth once for 1 dose. 354 mL 0   aspirin  EC 81 MG tablet Take 81 mg by mouth daily. Swallow whole.     atorvastatin  (LIPITOR) 20 MG tablet Take 1 tablet (20 mg total) by mouth daily. (Patient not taking: Reported on 08/17/2023) 30 tablet 4   BRILINTA  90 MG TABS tablet Take 90 mg by mouth 2 (two) times daily.     cyclobenzaprine  (FLEXERIL ) 5 MG tablet Take 1 tablet (5 mg total) by mouth 3 (three) times daily as needed for muscle spasms. 60 tablet 0   DULoxetine  (CYMBALTA ) 30 MG capsule Take 30 mg by mouth daily.     Empagliflozin (JARDIANCE PO) Take 25 mg by mouth daily.     gabapentin  (NEURONTIN ) 300 MG capsule Take 300 mg by mouth 3 (three) times daily.     glipiZIDE  (GLUCOTROL ) 5 MG tablet Take 1 tablet (5  mg total) by mouth daily before breakfast. (Patient not taking: Reported on 08/17/2023) 90 tablet 0   lisinopril  (ZESTRIL ) 20 MG tablet Take 1 tablet (20 mg total) by mouth daily. (Patient not taking: Reported on 08/17/2023) 30 tablet 1   nitroGLYCERIN  (NITROSTAT ) 0.3 MG SL tablet Place 0.3 mg under the tongue every 5 (five) minutes as needed for chest pain.     oxyCODONE -acetaminophen  (PERCOCET/ROXICET) 5-325 MG tablet Take 1 tablet by mouth every 8 (eight) hours as needed for severe pain (pain score 7-10) or moderate pain (pain score 4-6) (OK to take two before bed). OK to take two before bed 40 tablet 0   pantoprazole  (PROTONIX ) 40 MG tablet Take 1 tablet (40 mg total) by mouth daily. 30 tablet 0   No current facility-administered medications for this visit.    Patient confirms/reports the following allergies:  Allergies  Allergen Reactions   Bee Venom Anaphylaxis    No orders of the defined types were placed in this encounter.   AUTHORIZATION INFORMATION Primary Insurance: 1D#: Group #:  Secondary Insurance: 1D#: Group #:  SCHEDULE INFORMATION: Date: 11/28/23 Time: Location: [facility]

## 2023-08-30 ENCOUNTER — Other Ambulatory Visit (INDEPENDENT_AMBULATORY_CARE_PROVIDER_SITE_OTHER)

## 2023-08-30 DIAGNOSIS — I739 Peripheral vascular disease, unspecified: Secondary | ICD-10-CM | POA: Diagnosis not present

## 2023-08-30 DIAGNOSIS — Z9889 Other specified postprocedural states: Secondary | ICD-10-CM | POA: Diagnosis not present

## 2023-08-31 ENCOUNTER — Encounter (INDEPENDENT_AMBULATORY_CARE_PROVIDER_SITE_OTHER): Payer: Self-pay | Admitting: Nurse Practitioner

## 2023-08-31 ENCOUNTER — Ambulatory Visit (INDEPENDENT_AMBULATORY_CARE_PROVIDER_SITE_OTHER): Admitting: Nurse Practitioner

## 2023-08-31 VITALS — BP 149/88 | HR 90 | Ht 63.0 in | Wt 130.2 lb

## 2023-08-31 DIAGNOSIS — I70223 Atherosclerosis of native arteries of extremities with rest pain, bilateral legs: Secondary | ICD-10-CM

## 2023-08-31 DIAGNOSIS — I10 Essential (primary) hypertension: Secondary | ICD-10-CM

## 2023-08-31 DIAGNOSIS — E1142 Type 2 diabetes mellitus with diabetic polyneuropathy: Secondary | ICD-10-CM | POA: Diagnosis not present

## 2023-08-31 MED ORDER — OXYCODONE-ACETAMINOPHEN 5-325 MG PO TABS: 1.0000 | ORAL_TABLET | Freq: Three times a day (TID) | ORAL | 0 refills | Status: DC | PRN

## 2023-08-31 NOTE — Telephone Encounter (Signed)
 Blood thinner advice has been received from Dr. Lunette, per Dr. Ammon.  She advises patient to stop Brilinta  (3) days prior and restart (1) day after procedure.  Pt has been advised verbally and mychart message has been sent.  Pt verbalized understanding.  Thanks,  [place], CMA

## 2023-08-31 NOTE — Progress Notes (Signed)
 Subjective:    Patient ID: Claudia Mills, female    DOB: 1970-07-11, 53 y.o.   MRN: [id number] Chief Complaint  Patient presents with   Follow-up    3 week follow up ABI procedure    The patient returns to the office for followup and review status post angiogram with intervention on 08/08/2023.   Procedure: Procedure(s) Performed:             1.  Introduction catheter into left lower extremity 3rd order catheter placement              2.    Contrast injection third lower extremity for distal runoff             3.  Percutaneous transluminal angioplasty  and stent placement superficial femoral artery              4.  Percutaneous transluminal angioplasty and stent placement left external iliac artery.                5.  Celt closure right common femoral arteriotomy   The patient notes initial improvement in the lower extremity symptoms.  However over the last several days her symptoms have reoccurred and she is subsequently developed worsening rest pain.  She notes that she has not taken her Brilinta  until today following her procedure.  There have been no significant changes to the patient's overall health care.  No documented history of amaurosis fugax or recent TIA symptoms. There are no recent neurological changes noted. No documented history of DVT, PE or superficial thrombophlebitis. The patient denies recent episodes of angina or shortness of breath.   ABI's Rt=0.54 and Lt=0.44  (previous ABI's Rt=0.47 and Lt=0.48) Duplex US  of the bilateral lower extremities shows monophasic waveforms.    Review of Systems  Neurological:  Positive for weakness.  All other systems reviewed and are negative.      Objective:   Physical Exam Vitals reviewed.  HENT:     Head: Normocephalic.  Cardiovascular:     Rate and Rhythm: Normal rate.     Pulses:          Dorsalis pedis pulses are detected w/ Doppler on the right side.       Posterior tibial pulses are detected w/ Doppler on the  right side.  Pulmonary:     Effort: Pulmonary effort is normal.  Skin:    General: Skin is warm and dry.  Neurological:     Mental Status: She is alert and oriented to person, place, and time.  Psychiatric:        Mood and Affect: Mood normal.        Behavior: Behavior normal.        Thought Content: Thought content normal.        Judgment: Judgment normal.     BP (!) 149/88 (BP Location: Left Arm, Patient Position: Sitting, Cuff Size: Small)   Pulse 90   Ht 5' 3 (1.6 m)   Wt 130 lb 3.2 oz (59.1 kg)   LMP 09/29/2015 (Approximate) Comment: neg. preg test  BMI 23.06 kg/m   Past Medical History:  Diagnosis Date   Diabetes mellitus without complication (HCC)    Hypertension    MI (myocardial infarction) (HCC)    Tick bite    Pt states she does not remember daye of tic bite, but that is was since last visit with pain clinic and she was hospitalized for 1 day.    Social History  Socioeconomic History   Marital status: Widowed    Spouse name: Not on file   Number of children: Not on file   Years of education: Not on file   Highest education level: Not on file  Occupational History   Not on file  Tobacco Use   Smoking status: Never   Smokeless tobacco: Never  Vaping Use   Vaping status: Never Used  Substance and Sexual Activity   Alcohol use: Yes    Alcohol/week: 12.0 standard drinks of alcohol    Types: 12 Cans of beer per week   Drug use: Yes    Types: Marijuana    Comment: Patient states, I had some this morning.   Sexual activity: Not on file  Other Topics Concern   Not on file  Social History Narrative   Not on file   Social Drivers of Health   Financial Resource Strain: Not on file  Food Insecurity: No Food Insecurity (10/19/2022)   Hunger Vital Sign    Worried About Running Out of Food in the Last Year: Never true    Ran Out of Food in the Last Year: Never true  Transportation Needs: No Transportation Needs (10/19/2022)   PRAPARE - Therapist, art (Medical): No    Lack of Transportation (Non-Medical): No  Physical Activity: Not on file  Stress: Not on file  Social Connections: Not on file  Intimate Partner Violence: Not At Risk (10/19/2022)   Humiliation, Afraid, Rape, and Kick questionnaire    Fear of Current or Ex-Partner: No    Emotionally Abused: No    Physically Abused: No    Sexually Abused: No    Past Surgical History:  Procedure Laterality Date   CORONARY ANGIOPLASTY WITH STENT PLACEMENT     IR KYPHO LUMBAR INC FX REDUCE BONE BX UNI/BIL CANNULATION INC/IMAGING  10/06/2022   IR RADIOLOGIST EVAL & MGMT  09/20/2022   IR RADIOLOGIST EVAL & MGMT  10/13/2022   IR RADIOLOGIST EVAL & MGMT  10/20/2022   LOWER EXTREMITY ANGIOGRAPHY Left 08/08/2023   Procedure: Lower Extremity Angiography;  Surgeon: Jama Cordella MATSU, MD;  Location: [facility];  Service: Cardiovascular;  Laterality: Left;   LOWER EXTREMITY INTERVENTION Left 08/08/2023   Procedure: LOWER EXTREMITY INTERVENTION;  Surgeon: Jama Cordella MATSU, MD;  Location: [facility];  Service: Cardiovascular;  Laterality: Left;    History reviewed. No pertinent family history.  Allergies  Allergen Reactions   Bee Venom Anaphylaxis       Latest Ref Rng & Units 10/21/2022    3:05 PM 10/18/2022    4:48 PM 09/20/2022    9:30 AM  CBC  WBC 4.0 - 10.5 K/uL 11.9  17.1  6.5   Hemoglobin 12.0 - 15.0 g/dL 84.8  84.2  85.4   Hematocrit 36.0 - 46.0 % 42.0  44.6  43.5   Platelets 150 - 400 K/uL 397  377  246       CMP     Component Value Date/Time   NA 124 (L) 10/21/2022 1505   K 3.1 (L) 10/21/2022 1505   CL 88 (L) 10/21/2022 1505   CO2 21 (L) 10/21/2022 1505   GLUCOSE 461 (H) 10/21/2022 1505   BUN 9 08/08/2023 1359   CREATININE 0.63 08/08/2023 1359   CREATININE 0.58 09/20/2022 0930   CALCIUM  9.1 10/21/2022 1505   PROT 8.5 (H) 10/18/2022 1648   ALBUMIN 4.6 10/18/2022 1648   AST 17 10/18/2022 1648  ALT 12 10/18/2022 1648   ALKPHOS 126  10/18/2022 1648   BILITOT 1.4 (H) 10/18/2022 1648   EGFR 109 09/20/2022 0930   GFRNONAA >60 08/08/2023 1359     VAS US  ABI WITH/WO TBI Result Date: 08/04/2023  LOWER EXTREMITY DOPPLER STUDY Patient Name:  [facility]  Date of Exam:   08/03/2023 Medical Rec #: [id number]    Accession #:    [id number] Date of Birth: November 13, 1970    Patient Gender: F Patient Age:   28 years Exam Location:  [facility] Procedure:      VAS US  ABI WITH/WO TBI Referring Phys: [facility] --------------------------------------------------------------------------------  Indications: Claudication. High Risk Factors: Hypertension, Diabetes, no history of smoking, prior MI.  Performing Technologist: Donnice Charnley RVT  Examination Guidelines: A complete evaluation includes at minimum, Doppler waveform signals and systolic blood pressure reading at the level of bilateral brachial, anterior tibial, and posterior tibial arteries, when vessel segments are accessible. Bilateral testing is considered an integral part of a complete examination. Photoelectric Plethysmograph (PPG) waveforms and toe systolic pressure readings are included as required and additional duplex testing as needed. Limited examinations for reoccurring indications may be performed as noted.  ABI Findings: +---------+------------------+-----+----------+--------+ Right    Rt Pressure (mmHg)IndexWaveform  Comment  +---------+------------------+-----+----------+--------+ Brachial 163                                       +---------+------------------+-----+----------+--------+ PTA      76                0.47 monophasic         +---------+------------------+-----+----------+--------+ DP       70                0.43 monophasic         +---------+------------------+-----+----------+--------+ Great Toe55                0.34                    +---------+------------------+-----+----------+--------+  +---------+------------------+-----+----------+-------+ Left     Lt Pressure (mmHg)IndexWaveform  Comment +---------+------------------+-----+----------+-------+ Brachial 157                                      +---------+------------------+-----+----------+-------+ PTA      78                0.48 monophasic        +---------+------------------+-----+----------+-------+ DP       57                0.35 monophasic        +---------+------------------+-----+----------+-------+ Great Toe0                 0.00                   +---------+------------------+-----+----------+-------+ +-------+-----------+-----------+------------+------------+ ABI/TBIToday's ABIToday's TBIPrevious ABIPrevious TBI +-------+-----------+-----------+------------+------------+ Right  0.47       0.34                                +-------+-----------+-----------+------------+------------+ Left   0.48       0.00                                +-------+-----------+-----------+------------+------------+  Summary: Right: Resting right ankle-brachial index indicates severe right lower extremity arterial disease. The right toe-brachial index is abnormal. Limited imaging showed distal SFA occlusion. Left: Resting left ankle-brachial index indicates severe left lower extremity arterial disease. The left toe-brachial index is abnormal. Limited imaging showed distal SFA occlusion. *See table(s) above for measurements and observations.  Electronically signed by [name] MD on 08/04/2023 at 8:59:58 AM.    Final        Assessment & Plan:   1. 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  This represents a limb threatening ischemia and places the  patient at the risk for lower limb loss.  Patient should undergo angiography of the left lower extremity with the hope for intervention for limb salvage.  The risks and benefits as well as the alternative therapies was discussed in detail with the patient.  All questions were answered.  Patient agrees to proceed with left lower extremity angiography.  The patient will follow up with me in the office after the procedure.      2. Essential hypertension Continue antihypertensive medications as already ordered, these medications have been reviewed and there are no changes at this time.  3. Type 2 diabetes mellitus with peripheral neuropathy (HCC) Continue hypoglycemic medications as already ordered, these medications have been reviewed and there are no changes at this time.  Hgb A1C to be monitored as already arranged by primary service   Current Outpatient Medications on File Prior to Visit  Medication Sig Dispense Refill   aspirin  EC 81 MG tablet Take 81 mg by mouth daily. Swallow whole.     BRILINTA  90 MG TABS tablet Take 90 mg by mouth 2 (two) times daily.     cyclobenzaprine  (FLEXERIL ) 5 MG tablet Take 1 tablet (5 mg total) by mouth 3 (three) times daily as needed for muscle spasms. 60 tablet 0   DULoxetine  (CYMBALTA ) 30 MG capsule Take 30 mg by mouth daily.     Empagliflozin (JARDIANCE PO) Take 25 mg by mouth daily.     gabapentin  (NEURONTIN ) 300 MG capsule Take 300 mg by mouth 3 (three) times daily.     nitroGLYCERIN  (NITROSTAT ) 0.3 MG SL tablet Place 0.3 mg under the tongue every 5 (five) minutes as needed for chest pain.     pantoprazole  (PROTONIX ) 40 MG tablet Take 1 tablet (40 mg total) by mouth daily. 30 tablet 0   atorvastatin  (LIPITOR) 20 MG tablet Take 1 tablet (20 mg total) by mouth daily. (Patient not taking: Reported on 08/31/2023) 30 tablet 4   glipiZIDE  (GLUCOTROL ) 5 MG tablet Take 1 tablet (5 mg total) by mouth daily before breakfast. (Patient not taking: Reported on  08/31/2023) 90 tablet 0   lisinopril  (ZESTRIL ) 20 MG tablet Take 1 tablet (20 mg total) by mouth daily. (Patient not taking: Reported on 08/31/2023) 30 tablet 1   No current facility-administered medications on file prior to visit.    There are no Patient Instructions on file for this visit. No follow-ups on file.   Allis Quirarte E Aurelia Gras, NP

## 2023-09-01 DIAGNOSIS — M47896 Other spondylosis, lumbar region: Secondary | ICD-10-CM | POA: Diagnosis not present

## 2023-09-05 LAB — VAS US ABI WITH/WO TBI
Left ABI: 0.44
Right ABI: 0.54

## 2023-09-11 ENCOUNTER — Telehealth (INDEPENDENT_AMBULATORY_CARE_PROVIDER_SITE_OTHER): Payer: Self-pay

## 2023-09-11 NOTE — Telephone Encounter (Signed)
 Spoke with the patient and she is scheduled for a LLE angio with Dr. Jama on 10/03/23 with a 6:45 am arrival time to the [facility]. Pre-procedure instructions were discussed and will be sent to Mychart and mailed.

## 2023-09-12 ENCOUNTER — Encounter: Payer: Self-pay | Admitting: Nurse Practitioner

## 2023-09-12 ENCOUNTER — Ambulatory Visit: Attending: Student in an Organized Health Care Education/Training Program | Admitting: Nurse Practitioner

## 2023-09-12 VITALS — BP 136/78 | HR 87 | Temp 97.2°F | Ht 63.0 in | Wt 130.0 lb

## 2023-09-12 DIAGNOSIS — M545 Low back pain, unspecified: Secondary | ICD-10-CM | POA: Insufficient documentation

## 2023-09-12 DIAGNOSIS — G8929 Other chronic pain: Secondary | ICD-10-CM | POA: Insufficient documentation

## 2023-09-12 DIAGNOSIS — S32010S Wedge compression fracture of first lumbar vertebra, sequela: Secondary | ICD-10-CM | POA: Insufficient documentation

## 2023-09-12 DIAGNOSIS — G894 Chronic pain syndrome: Secondary | ICD-10-CM | POA: Insufficient documentation

## 2023-09-12 DIAGNOSIS — M47816 Spondylosis without myelopathy or radiculopathy, lumbar region: Secondary | ICD-10-CM | POA: Insufficient documentation

## 2023-09-12 NOTE — Progress Notes (Signed)
 PROVIDER NOTE: Interpretation of information contained herein should be left to medically-trained personnel. Specific patient instructions are provided elsewhere under Patient Instructions section of medical record. This document was created in part using AI and STT-dictation technology, any transcriptional errors that may result from this process are unintentional.  Patient: Claudia Mills  Service: E/M   PCP: Claudia Maxie LABOR, MD  DOB: 1970-10-15  DOS: 09/12/2023  Provider: Emmy MARLA Blanch, NP  MRN: [id number]  Delivery: Face-to-face  Specialty: Interventional Pain Management  Type: Established Patient  Setting: Ambulatory outpatient facility  Specialty designation: 09  Referring Prov.: Claudia Maxie LABOR, MD  Location: Outpatient office facility       History of present illness (HPI) Claudia Mills, a 53 y.o. year old female, is here today because of her Lumbar spondylosis [M47.816]. Claudia Mills primary complain today is Back Pain (lower)  Pertinent problems: Claudia Mills has lumbar facet arthropathy; chronic pain syndrome; and chronic bilateral low back pain without sciatica on their pertinent problem list.   Pain Assessment: Severity of Chronic pain is reported as a 4 /10. Location: Back Right, Left/Denies. Onset: More than a month ago. Quality: Aching. Timing: Intermittent. Modifying factor(s): procedure. Vitals:  height is 5' 3 (1.6 m) and weight is 130 lb (59 kg). Her temperature is 97.2 F (36.2 C) (abnormal). Her blood pressure is 136/78 and her pulse is 87. Her oxygen saturation is 100%.  BMI: Estimated body mass index is 23.03 kg/m as calculated from the following:   Height as of this encounter: 5' 3 (1.6 m).   Weight as of this encounter: 130 lb (59 kg).  Last encounter: 05/31/2023 Last procedure: 07/19/2023  Reason for encounter: post-procedure evaluation and assessment.   Claudia Mills underwent a Therapeutic/Palliative Lumbar facet radiofrequency ablation (RFA) on July 19, 2023.  The patient  reports 100% pain relief and functional improvement during the local anesthetic phase, with sustained 60% pain relief and functional improvement since the procedure.  The patient has an upcoming appointment with her vascular surgeon for angiography procedure on left lower extremity due to her vascular blockage.   Procedure Procedure: Lumbar Facet, Medial Branch Radiofrequency Ablation (RFA) #1  Laterality: Bilateral (-50)  Level: L3, L4, and L5 Medial Branch Level(s). These levels will denervate the L3-4 and L4-5 lumbar facet joints.  Imaging: Fluoroscopy-guided         Anesthesia: Local anesthesia (1-2% Lidocaine ) Sedation: Minimal Sedation                       DOS: 07/19/2023  Performed by: Wallie Sherry, MD   Purpose: Therapeutic/Palliative Indications: Low back pain severe enough to impact quality of life or function. Indications: 1. Lumbar spondylosis   2. Lumbar facet arthropathy   3. Chronic bilateral low back pain without sciatica     Claudia Mills has been dealing with the above chronic pain for longer than three months and has either failed to respond, was unable to tolerate, or simply did not get enough benefit from other more conservative therapies including, but not limited to: 1. Over-the-counter medications 2. Anti-inflammatory medications 3. Muscle relaxants 4. Membrane stabilizers 5. Opioids 6. Physical therapy and/or chiropractic manipulation 7. Modalities (Heat, ice, etc.) 8. Invasive techniques such as nerve blocks. Claudia Mills has attained more than 50% relief of the pain from a series of diagnostic injections conducted in separate occasions.   Pain Score: Pre-procedure: 3 /10 Post-procedure: 3 /10  Post-Procedure Evaluation   Effectiveness:  Initial hour after  procedure: 100 % . Subsequent 4-6 hours post-procedure: 100 % . Analgesia past initial 6 hours: 60 % . Ongoing improvement:  Analgesic:  Claudia Mills underwent a Therapeutic/Palliative Lumbar facet  radiofrequency ablation (RFA) on July 19, 2023.  The patient reports 100% pain relief and functional improvement during the local anesthetic phase, with sustained 60% pain relief and functional improvement since the procedure.  Function: Claudia Mills reports improvement in function ROM: Claudia Mills reports improvement in ROM  Pharmacotherapy Assessment   Monitoring: [place] PMP: PDMP reviewed during this encounter.       Pharmacotherapy: No side-effects or adverse reactions reported. Compliance: No problems identified. Effectiveness: Clinically acceptable.  Claudia Dorothe LABOR, RN  09/12/2023 11:13 AM  Sign when Signing Visit Safety precautions to be maintained throughout the outpatient stay will include: orient to surroundings, keep bed in low position, maintain call bell within reach at all times, provide assistance with transfer out of bed and ambulation.     UDS:  No results found for: SUMMARY  No results found for: CBDTHCR No results found for: D8THCCBX No results found for: D9THCCBX  ROS  Constitutional: Denies any fever or chills Gastrointestinal: No reported hemesis, hematochezia, vomiting, or acute GI distress Musculoskeletal: Denies any acute onset joint swelling, redness, loss of ROM, or weakness Neurological: No reported episodes of acute onset apraxia, aphasia, dysarthria, agnosia, amnesia, paralysis, loss of coordination, or loss of consciousness  Medication Review  DULoxetine , Empagliflozin, aspirin  EC, atorvastatin , cyclobenzaprine , gabapentin , glipiZIDE , lisinopril , nitroGLYCERIN , oxyCODONE -acetaminophen , pantoprazole , and ticagrelor   History Review  Allergy: Claudia Mills is allergic to bee venom. Drug: Claudia Mills  reports current drug use. Drug: Marijuana. Alcohol:  reports current alcohol use of about 12.0 standard drinks of alcohol per week. Tobacco:  reports that she has never smoked. She has never used smokeless tobacco. Social: Claudia Mills  reports that she has never  smoked. She has never used smokeless tobacco. She reports current alcohol use of about 12.0 standard drinks of alcohol per week. She reports current drug use. Drug: Marijuana. Medical:  has a past medical history of Diabetes mellitus without complication (HCC), Hypertension, MI (myocardial infarction) (HCC), and Tick bite. Surgical: Claudia Mills  has a past surgical history that includes Coronary angioplasty with stent; IR Radiologist Eval & Mgmt (09/20/2022); IR KYPHO LUMBAR INC FX REDUCE BONE BX UNI/BIL CANNULATION INC/IMAGING (10/06/2022); IR Radiologist Eval & Mgmt (10/13/2022); IR Radiologist Eval & Mgmt (10/20/2022); LOWER EXTREMITY INTERVENTION (Left, 08/08/2023); and Lower Extremity Angiography (Left, 08/08/2023). Family: family history is not on file.  Laboratory Chemistry Profile   Renal Lab Results  Component Value Date   BUN 9 08/08/2023   CREATININE 0.63 08/08/2023   BCR 10 09/20/2022   GFRAA >60 12/30/2015   GFRNONAA >60 08/08/2023    Hepatic Lab Results  Component Value Date   AST 17 10/18/2022   ALT 12 10/18/2022   ALBUMIN 4.6 10/18/2022   ALKPHOS 126 10/18/2022   LIPASE 55 (H) 10/18/2022    Electrolytes Lab Results  Component Value Date   NA 124 (L) 10/21/2022   K 3.1 (L) 10/21/2022   CL 88 (L) 10/21/2022   CALCIUM  9.1 10/21/2022   MG 1.6 (L) 10/19/2022   PHOS 2.8 10/19/2022    Bone No results found for: VD25OH, CI874NY7UNU, CI6874NY7, CI7874NY7, 25OHVITD1, 25OHVITD2, 25OHVITD3, TESTOFREE, TESTOSTERONE  Inflammation (CRP: Acute Phase) (ESR: Chronic Phase) Lab Results  Component Value Date   LATICACIDVEN 1.8 10/18/2022         Note: Above Lab results reviewed.  Recent Imaging Review  VAS US  ABI WITH/WO TBI  LOWER EXTREMITY DOPPLER STUDY  Patient Name:  Claudia Mills  Date of Exam:   08/30/2023 Medical Rec #: [id number]    Accession #:    [id number] Date of Birth: [birth year]    Patient Gender: F Patient Age:   40 years Exam Location:  [facility] Procedure:      VAS US  ABI WITH/WO TBI Referring Phys: [facility]  --------------------------------------------------------------------------------   Indications: Rest pain, and peripheral artery disease.  High Risk Factors: Hypertension, Diabetes, prior MI.  Other Factors: Patient complains that her left knee was painful and swollen for                several days post stenting. She complains of left leg rest pain                today.  Vascular Interventions: 08/08/2023 Left SFA and EIA stent.  Performing Technologist: Donnice Charnley RVT    Examination Guidelines: A complete evaluation includes at minimum, Doppler waveform signals and systolic blood pressure reading at the level of bilateral brachial, anterior tibial, and posterior tibial arteries, when vessel segments are accessible. Bilateral testing is considered an integral part of a complete examination. Photoelectric Plethysmograph (PPG) waveforms and toe systolic pressure readings are included as required and additional duplex testing as needed. Limited examinations for reoccurring indications may be performed as noted.    ABI Findings: +---------+------------------+-----+----------+--------+ Right    Rt Pressure (mmHg)IndexWaveform  Comment  +---------+------------------+-----+----------+--------+ Brachial 165                                       +---------+------------------+-----+----------+--------+ PTA      91                0.54 monophasic         +---------+------------------+-----+----------+--------+ DP       82                0.49 monophasic         +---------+------------------+-----+----------+--------+ Great Toe63                0.37                    +---------+------------------+-----+----------+--------+  +---------+------------------+-----+----------+-------+ Left     Lt Pressure (mmHg)IndexWaveform   Comment +---------+------------------+-----+----------+-------+ Brachial 169                                      +---------+------------------+-----+----------+-------+ PTA      74                0.44 monophasic        +---------+------------------+-----+----------+-------+ DP       59                0.35 monophasic        +---------+------------------+-----+----------+-------+ Great Toe0                 0.00                   +---------+------------------+-----+----------+-------+  +-------+-----------+-----------+------------+------------+ ABI/TBIToday's ABIToday's TBIPrevious ABIPrevious TBI +-------+-----------+-----------+------------+------------+ Right  0.54       0.37       0.47        0.34         +-------+-----------+-----------+------------+------------+  Left   0.44       0.00       0.48        0.00         +-------+-----------+-----------+------------+------------+     Limited imaging demonstrated occluded left SFA stent. Bilateral ABIs appear essentially unchanged compared to prior study on 08/03/2023.   Summary: Right: Resting right ankle-brachial index indicates moderate right lower extremity arterial disease. The right toe-brachial index is abnormal.  Left: Resting left ankle-brachial index indicates severe left lower extremity arterial disease. The left toe-brachial index is abnormal.  *See table(s) above for measurements and observations.    Electronically signed by [name] MD on 09/05/2023 at 7:19:00 AM.      Final   Note: Reviewed        Physical Exam  Vitals: BP 136/78   Pulse 87   Temp (!) 97.2 F (36.2 C)   Ht 5' 3 (1.6 m)   Wt 130 lb (59 kg)   LMP 09/29/2015 (Approximate) Comment: neg. preg test  SpO2 100%   BMI 23.03 kg/m  BMI: Estimated body mass index is 23.03 kg/m as calculated from the following:   Height as of this encounter: 5' 3 (1.6 m).   Weight as of this encounter: 130 lb (59  kg). Ideal: Ideal body weight: 52.4 kg (115 lb 8.3 oz) Adjusted ideal body weight: 55 kg (121 lb 5 oz) General appearance: Well nourished, well developed, and well hydrated. In no apparent acute distress Mental status: Alert, oriented x 3 (person, place, & time)       Respiratory: No evidence of acute respiratory distress Eyes: PERLA   Assessment   Diagnosis Status  1. Lumbar spondylosis   2. Lumbar facet arthropathy   3. Chronic bilateral low back pain without sciatica   4. Chronic pain syndrome   5. Compression fracture of L1 vertebra, sequela    Controlled Controlled Controlled   Updated Problems: No problems updated.  Plan of Care  Problem-specific:  Assessment and Plan Patient is status post 2 positive diagnostic bilateral lumbar facet medial branch nerve blocks done 01/18/2023, 05/03/2023 and Lumbar RFA # 1 on 07/19/2023.    Claudia Mills has a current medication list which includes the following long-term medication(s): duloxetine , gabapentin , pantoprazole , atorvastatin , glipizide , and lisinopril .  Pharmacotherapy (Medications Ordered): No orders of the defined types were placed in this encounter.  Orders:  No orders of the defined types were placed in this encounter.       No follow-ups on file.    Recent Visits Date Type Provider Dept  07/19/23 Procedure visit Marcelino Nurse, MD [facility]  Showing recent visits within past 90 days and meeting all other requirements Today's Visits Date Type Provider Dept  09/12/23 Office Visit Jerman Tinnon K, NP [facility]  Showing today's visits and meeting all other requirements Future Appointments No visits were found meeting these conditions. Showing future appointments within next 90 days and meeting all other requirements  I discussed the assessment and treatment plan with the patient. The patient was provided an opportunity to ask questions and all were answered. The patient agreed with the  plan and demonstrated an understanding of the instructions.  Patient advised to call back or seek an in-person evaluation if the symptoms or condition worsens.  Duration of encounter: 20 minutes.  Total time on encounter, as per AMA guidelines included both the face-to-face and non-face-to-face time personally spent by the physician and/or other qualified health care professional(s)  on the day of the encounter (includes time in activities that require the physician or other qualified health care professional and does not include time in activities normally performed by clinical Mills). Physician's time may include the following activities when performed: Preparing to see the patient (e.g., pre-charting review of records, searching for previously ordered imaging, lab work, and nerve conduction tests) Review of prior analgesic pharmacotherapies. Reviewing PMP Interpreting ordered tests (e.g., lab work, imaging, nerve conduction tests) Performing post-procedure evaluations, including interpretation of diagnostic procedures Obtaining and/or reviewing separately obtained history Performing a medically appropriate examination and/or evaluation Counseling and educating the patient/family/caregiver Ordering medications, tests, or procedures Referring and communicating with other health care professionals (when not separately reported) Documenting clinical information in the electronic or other health record Independently interpreting results (not separately reported) and communicating results to the patient/ family/caregiver Care coordination (not separately reported)  Note by: Zaya Kessenich K Tran Randle, NP (TTS and AI technology used. I apologize for any typographical errors that were not detected and corrected.) Date: 09/12/2023; Time: 11:47 AM

## 2023-09-12 NOTE — Progress Notes (Signed)
 Safety precautions to be maintained throughout the outpatient stay will include: orient to surroundings, keep bed in low position, maintain call bell within reach at all times, provide assistance with transfer out of bed and ambulation.

## 2023-09-17 IMAGING — CR DG LUMBAR SPINE 2-3V
1 series · 3 of 3 positions shown · non-contrast
Comparison: Lumbar spine radiograph 04/09/2020.

CLINICAL DATA: Patient with lower back pain for 2 weeks. No known
injury.

EXAM:
LUMBAR SPINE - 2-3 VIEW

[Series 1: dg lumbar spine 2-3 views · 0.14mm/px · 3 of 3 slices shown]
[im 1/3]
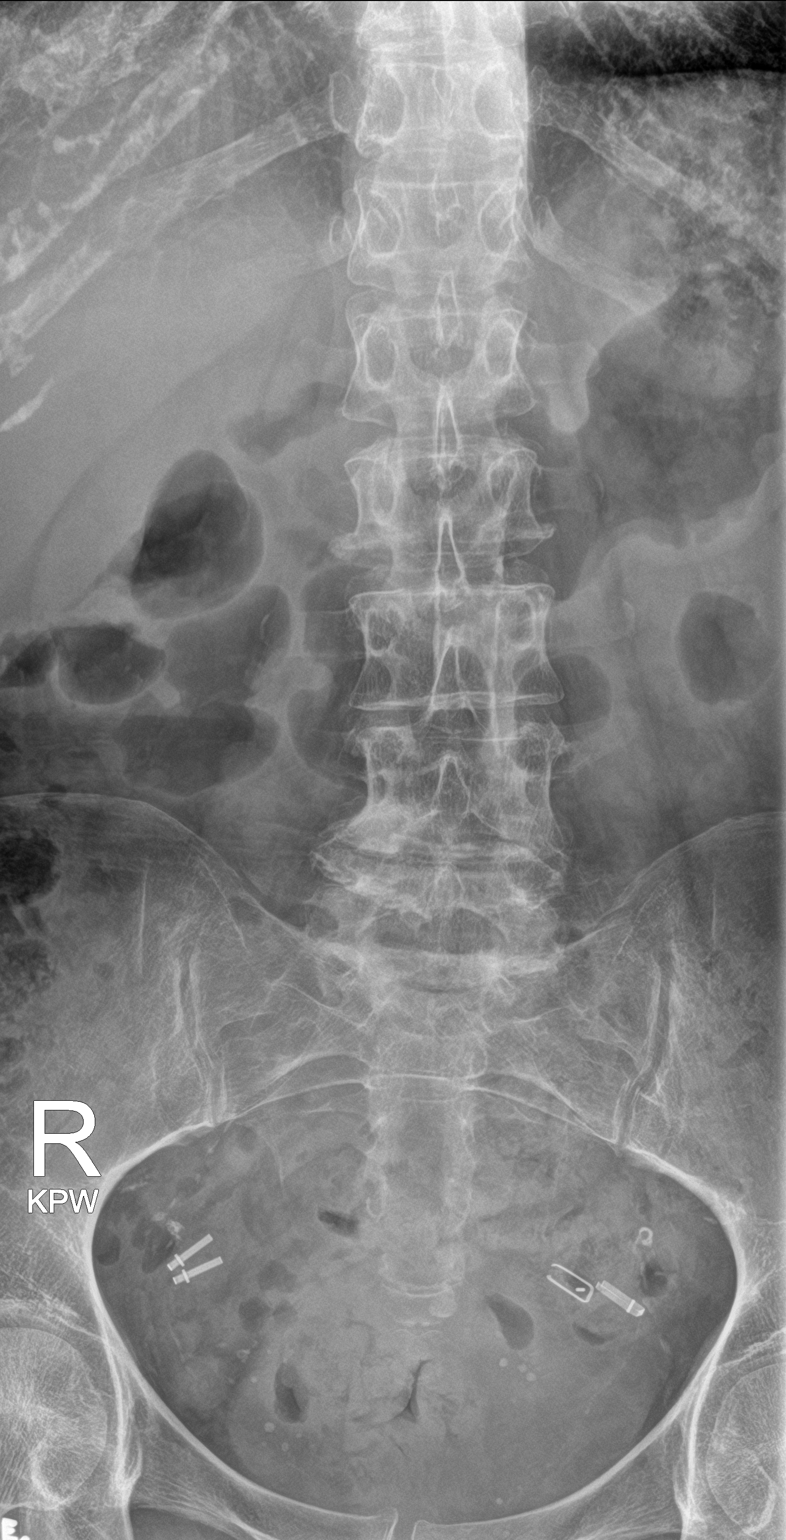
[im 2/3]
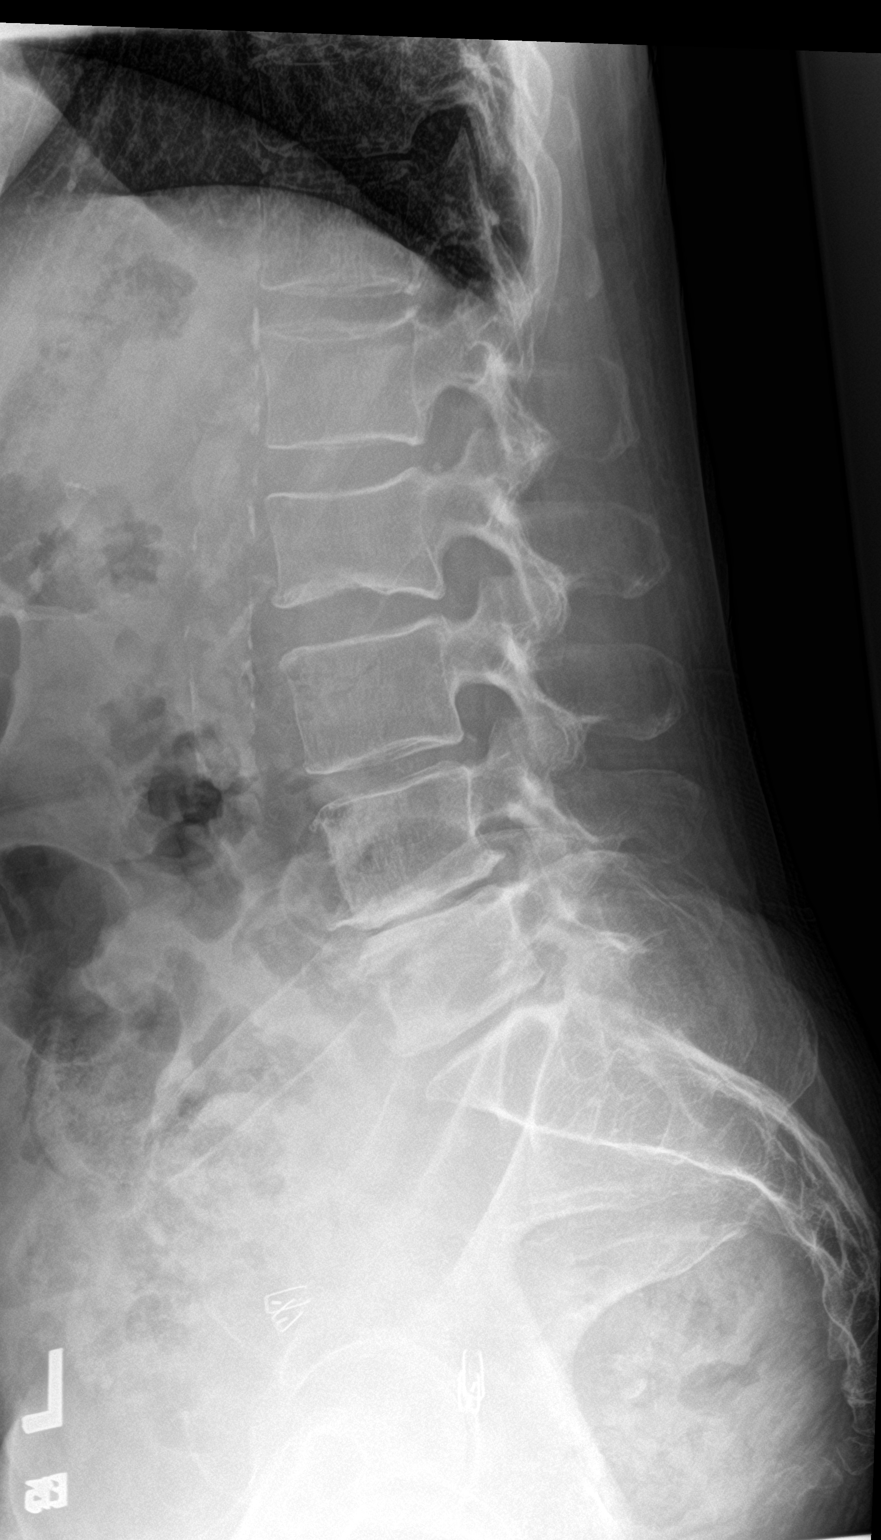
[im 3/3]
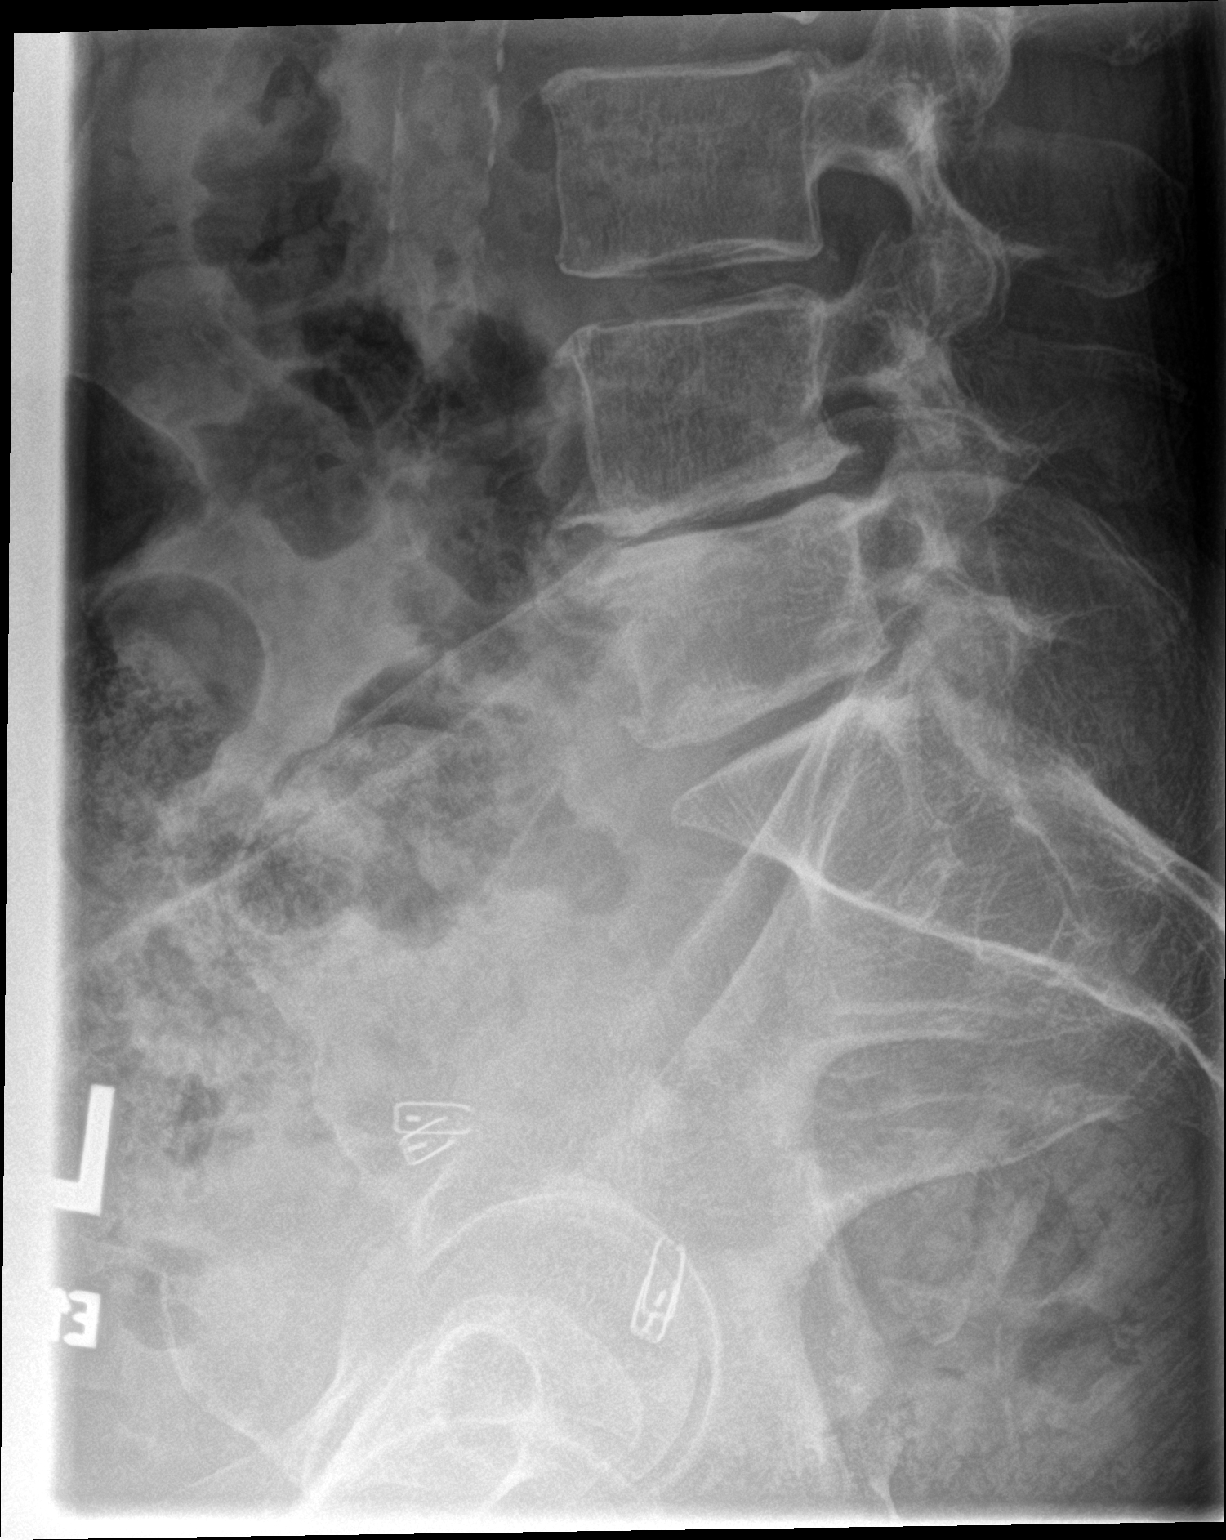

[3 of 3 positions shown; findings below may reference images not displayed]

FINDINGS: Normal anatomic alignment. Age-indeterminate compression deformity
of the inferior L2 vertebral body. L4-5 and L5-S1 degenerative disc
disease. SI joints unremarkable. Pelvic phleboliths.
IMPRESSION: Age-indeterminate compression deformity of the inferior endplate of
the L2 vertebral body. Recommend correlation for point tenderness.

Degenerative changes.

## 2023-09-19 DIAGNOSIS — F331 Major depressive disorder, recurrent, moderate: Secondary | ICD-10-CM | POA: Diagnosis not present

## 2023-09-22 NOTE — Progress Notes (Deleted)
 Referring Physician:  Lorel Maxie LABOR, MD [address] [place] [address] [place],  [place] [address]  Primary Physician:  Lorel Maxie LABOR, MD  History of Present Illness: [name] has a history of HTN, CAD, and DM. Recent diagnosis of vertigo.   She had L1 kyphoplasty on 10/06/22. She was doing well after her procedure with no pain until a week later when she tripped over her cat on 10/11/22.   Last seen by me on 08/17/23 for follow up. She has known lumbar spondylosis with DDD, moderate central stenosis L4-L5 with left lateral recess stenosis and mild/moderate foraminal stenosis on left at L5-S1.    She was doing well at her last visit and had improvement in left leg symptoms with vascular procedure. She has intermittent LBP with intermittent weakness/tingling in her left leg.   She was given note keeping her out of work, she was to discuss further work restrictions with [employer]. She will need FCE if I continue to do her restrictions.   She is here for follow up.        She was to discuss RFA with Dr. Marcelino, continue with lumbar PT, and get EMG done.   She had bilateral RFA L3-L5 on 07/19/23. She also had recent vascular angioplasty and stents on 08/08/23.   She has good days and bad days. She has intermittent LBP along with intermittent weakness and tingling in her left leg. Symptoms in left leg have improved since vascular procedure. She has known neuropathy as well. Pain is worse with increased activity and better with rest. She is using a rollator that she borrowed and would like a prescription.   She did not start PT.   She is taking neurontin . She is out of flexeril .   Conservative measures:  Physical therapy: nothing recent Multimodal medical therapy including regular antiinflammatories: percocet   Injections:  bilateral RFA L3-L5 on 07/19/23 bilateral L3, L4, L5 MBB on 05/03/23 bilateral L3, L4, L5 MBB on 01/18/23  Past Surgery: L1 kyphoplasty on  10/06/22  Claudia Mills has no symptoms of cervical myelopathy.  The symptoms are causing a significant impact on the patient's life.   Review of Systems:  A 10 point review of systems is negative, except for the pertinent positives and negatives detailed in the HPI.  Past Medical History: Past Medical History:  Diagnosis Date   Diabetes mellitus without complication (HCC)    Hypertension    MI (myocardial infarction) (HCC)    Tick bite    Pt states she does not remember daye of tic bite, but that is was since last visit with pain clinic and she was hospitalized for 1 day.    Past Surgical History: Past Surgical History:  Procedure Laterality Date   CORONARY ANGIOPLASTY WITH STENT PLACEMENT     IR KYPHO LUMBAR INC FX REDUCE BONE BX UNI/BIL CANNULATION INC/IMAGING  10/06/2022   IR RADIOLOGIST EVAL & MGMT  09/20/2022   IR RADIOLOGIST EVAL & MGMT  10/13/2022   IR RADIOLOGIST EVAL & MGMT  10/20/2022   LOWER EXTREMITY ANGIOGRAPHY Left 08/08/2023   Procedure: Lower Extremity Angiography;  Surgeon: Jama Cordella MATSU, MD;  Location: [facility] INVASIVE [facility];  Service: Cardiovascular;  Laterality: Left;   LOWER EXTREMITY INTERVENTION Left 08/08/2023   Procedure: LOWER EXTREMITY INTERVENTION;  Surgeon: Jama Cordella MATSU, MD;  Location: [facility]AS[facility] CV LAB;  Service: Cardiovascular;  Laterality: Left;    Allergies: Allergies as of 10/03/2023 - Review Complete 09/12/2023  Allergen Reaction  Noted   Bee venom Anaphylaxis 04/07/2021    Medications: Outpatient Encounter Medications as of 10/03/2023  Medication Sig   aspirin  EC 81 MG tablet Take 81 mg by mouth daily. Swallow whole.   atorvastatin  (LIPITOR) 20 MG tablet Take 1 tablet (20 mg total) by mouth daily. (Patient not taking: Reported on 09/12/2023)   BRILINTA  90 MG TABS tablet Take 90 mg by mouth 2 (two) times daily.   cyclobenzaprine  (FLEXERIL ) 5 MG tablet Take 1 tablet (5 mg total) by mouth 3 (three) times daily as needed for muscle spasms.    DULoxetine  (CYMBALTA ) 30 MG capsule Take 30 mg by mouth daily.   Empagliflozin (JARDIANCE PO) Take 25 mg by mouth daily.   gabapentin  (NEURONTIN ) 300 MG capsule Take 300 mg by mouth 3 (three) times daily.   glipiZIDE  (GLUCOTROL ) 5 MG tablet Take 1 tablet (5 mg total) by mouth daily before breakfast. (Patient not taking: Reported on 09/12/2023)   lisinopril  (ZESTRIL ) 20 MG tablet Take 1 tablet (20 mg total) by mouth daily. (Patient not taking: Reported on 09/12/2023)   nitroGLYCERIN  (NITROSTAT ) 0.3 MG SL tablet Place 0.3 mg under the tongue every 5 (five) minutes as needed for chest pain.   oxyCODONE -acetaminophen  (PERCOCET/ROXICET) 5-325 MG tablet Take 1 tablet by mouth every 8 (eight) hours as needed for severe pain (pain score 7-10) or moderate pain (pain score 4-6) (OK to take two before bed). OK to take two before bed   pantoprazole  (PROTONIX ) 40 MG tablet Take 1 tablet (40 mg total) by mouth daily.   No facility-administered encounter medications on file as of 10/03/2023.    Social History: Social History   Tobacco Use   Smoking status: Never   Smokeless tobacco: Never  Vaping Use   Vaping status: Never Used  Substance Use Topics   Alcohol use: Yes    Alcohol/week: 12.0 standard drinks of alcohol    Types: 12 Cans of beer per week   Drug use: Yes    Types: Marijuana    Comment: Patient states, I had some this morning.    Family Medical History: No family history on file.  Physical Examination: There were no vitals filed for this visit.     Awake, alert, oriented to person, place, and time.  Speech is clear and fluent. Fund of knowledge is appropriate.   Cranial Nerves: Pupils equal round and reactive to light.  Facial tone is symmetric.    She has no lower lumbar tenderness.   No abnormal lesions on exposed skin.   Strength: Side Iliopsoas Quads Hamstring PF DF EHL  R 5 5 5 5 5 5   L 5 5 5 5 5 5    Clonus is not present.   Bilateral lower extremity sensation is  intact to light touch, slightly diminished in right leg.   Patella and achilles reflex is 2+ and symmetric bilaterally.   She ambulates with a rollator.    Medical Decision Making  Imaging: none  Assessment and Plan: [name] is a pleasant 53 y.o. female who had L1 kyphoplasty on 10/06/22. She was doing well after her procedure with no pain until a week later when she tripped over her cat on 10/11/22.   Overall, she looks better than she has previously. She has good days and bad days. She has intermittent LBP along with intermittent weakness and tingling in her left leg. Symptoms in left leg have improved since vascular procedure.    Previous MRI showed lumbar spondylosis with DDD, moderate  central stenosis L4-L5 with left lateral recess stenosis and mild/moderate foraminal stenosis on left at L5-S1.   Treatment options discussed with patient and following plan made:   - Prescription given for rollator.  - Hold on EMG. A lot of her lower extremity symptoms may have been more vascular mediated.  - She does bending, twisting, and lifting at work (packs socks). Note keeping her out until follow up with me in 6 weeks.  - She will discuss with vascular and pain management regarding further work restrictions.  - From my standpoint, she will need an FCE if she cannot return back to work at next visit.  - Refill of flexeril  given. Reviewed dosing and side effects.   I spent a total of 25 minutes in face-to-face and non-face-to-face activities related to this patient's care today including review of outside records, review of imaging, review of symptoms, physical exam, discussion of differential diagnosis, discussion of treatment options, and documentation.   Glade Boys PA-C Dept. of Neurosurgery

## 2023-09-29 ENCOUNTER — Telehealth (INDEPENDENT_AMBULATORY_CARE_PROVIDER_SITE_OTHER): Payer: Self-pay

## 2023-09-29 NOTE — Telephone Encounter (Signed)
 Patient left a message to be canceled for her procedure with Dr. Jama on 10/03/23 at the [facility]. Patient will be canceled.

## 2023-10-03 ENCOUNTER — Encounter: Admission: RE | Payer: Self-pay | Source: Home / Self Care

## 2023-10-03 ENCOUNTER — Ambulatory Visit: Admitting: Orthopedic Surgery

## 2023-10-03 ENCOUNTER — Ambulatory Visit: Admission: RE | Admit: 2023-10-03 | Source: Home / Self Care | Admitting: Vascular Surgery

## 2023-10-03 DIAGNOSIS — I70229 Atherosclerosis of native arteries of extremities with rest pain, unspecified extremity: Secondary | ICD-10-CM

## 2023-10-03 SURGERY — LOWER EXTREMITY ANGIOGRAPHY
Anesthesia: Moderate Sedation | Site: Leg Lower | Laterality: Left

## 2023-10-09 ENCOUNTER — Telehealth (INDEPENDENT_AMBULATORY_CARE_PROVIDER_SITE_OTHER): Payer: Self-pay

## 2023-10-09 NOTE — Telephone Encounter (Signed)
 Patient called to reschedule her LLE angio with Dr. Jama. Patient rescheduled to 10/24/23 with a 12:00 pm arrival time to the [facility]. Pre-procedure instructions were discussed and will be mailed.

## 2023-10-19 ENCOUNTER — Telehealth (INDEPENDENT_AMBULATORY_CARE_PROVIDER_SITE_OTHER): Payer: Self-pay

## 2023-10-19 DIAGNOSIS — Z1389 Encounter for screening for other disorder: Secondary | ICD-10-CM | POA: Diagnosis not present

## 2023-10-19 DIAGNOSIS — I739 Peripheral vascular disease, unspecified: Secondary | ICD-10-CM | POA: Diagnosis not present

## 2023-10-19 DIAGNOSIS — Z0131 Encounter for examination of blood pressure with abnormal findings: Secondary | ICD-10-CM | POA: Diagnosis not present

## 2023-10-19 DIAGNOSIS — I2510 Atherosclerotic heart disease of native coronary artery without angina pectoris: Secondary | ICD-10-CM | POA: Diagnosis not present

## 2023-10-19 DIAGNOSIS — E119 Type 2 diabetes mellitus without complications: Secondary | ICD-10-CM | POA: Diagnosis not present

## 2023-10-19 DIAGNOSIS — I10 Essential (primary) hypertension: Secondary | ICD-10-CM | POA: Diagnosis not present

## 2023-10-19 DIAGNOSIS — Z712 Person consulting for explanation of examination or test findings: Secondary | ICD-10-CM | POA: Diagnosis not present

## 2023-10-19 NOTE — Telephone Encounter (Signed)
 Spoke with the patient to reschedule her procedure with Dr. Jama. Patient stated she has a summer cold, patient has been rescheduled to 11/07/23 with a 9:00 am arrival time to the [facility]. Pre-procedure instructions were discussed and will be sent to Mychart and mailed.

## 2023-10-24 DIAGNOSIS — I70229 Atherosclerosis of native arteries of extremities with rest pain, unspecified extremity: Secondary | ICD-10-CM

## 2023-10-24 DIAGNOSIS — F331 Major depressive disorder, recurrent, moderate: Secondary | ICD-10-CM | POA: Diagnosis not present

## 2023-11-03 ENCOUNTER — Telehealth (INDEPENDENT_AMBULATORY_CARE_PROVIDER_SITE_OTHER): Payer: Self-pay

## 2023-11-03 NOTE — Telephone Encounter (Signed)
 Tried to call pt x2. LVM for pt to CB to schedule appointment at [facility].  fu see GS/FB (Cancelled surgery x4)

## 2023-11-03 NOTE — Telephone Encounter (Signed)
 Patient left a message to rescheduled her LLE angio that was scheduled with Dr. Jama on 11/07/23. I contacted the patient and explained that since this was the fourth time being rescheduled she will need to come in for an appt to evaluate. Patient stated she was okay with that.

## 2023-11-03 NOTE — Progress Notes (Addendum)
 Referring Physician:  Lorel Maxie LABOR, MD [address] [place],  [place] [address]  Primary Physician:  Lorel Maxie LABOR, MD  History of Present Illness: [name] has a history of HTN, CAD, and DM. Recent diagnosis of vertigo.   She had L1 kyphoplasty on 10/06/22. She was doing well after her procedure with no pain until a week later when she tripped over her cat on 10/11/22.   Last seen by me on 08/17/23 for follow up. She has known lumbar spondylosis with DDD, moderate central stenosis L4-L5 with left lateral recess stenosis and mild/moderate foraminal stenosis on left at L5-S1.    She was doing better with intermittent LBP and intermittent weakness/tingling in left leg.   She was to discuss work restrictions with vascular and pain management. We discussed FCE if she cannot go back to work because of her back pain.   She is here for follow up.   She had vascular intervention with stent on both legs on 08/08/23. Has another vascular procedure scheduled on 11/07/23- this was cancelled.   Seen in ED at [facility] yesterday for purple toes on left foot- had CTA done showing occlusion of stent on left. Dr. Jama wanted to see her without 48-72 hours. She has scheduled follow up on Thursday.   She has initial improvement in left leg pain after her stents. She still has intermittent LBP with good days and bad days. She has intermittent weakness in left leg that is worse with prolonged standing and walking.   She is taking neurontin  and prn flexeril .   Conservative measures:  Physical therapy: nothing recent Multimodal medical therapy including regular antiinflammatories: percocet   Injections:  bilateral RFA L3-L5 on 07/19/23 bilateral L3, L4, L5 MBB on 05/03/23 bilateral L3, L4, L5 MBB on 01/18/23  Past Surgery: L1 kyphoplasty on 10/06/22  Watson Quivers has no symptoms of cervical myelopathy.  The symptoms are causing a significant impact on the patient's life.   Review of  Systems:  A 10 point review of systems is negative, except for the pertinent positives and negatives detailed in the HPI.  Past Medical History: Past Medical History:  Diagnosis Date   Diabetes mellitus without complication (HCC)    Hypertension    MI (myocardial infarction) (HCC)    Tick bite    Pt states she does not remember daye of tic bite, but that is was since last visit with pain clinic and she was hospitalized for 1 day.    Past Surgical History: Past Surgical History:  Procedure Laterality Date   CORONARY ANGIOPLASTY WITH STENT PLACEMENT     IR KYPHO LUMBAR INC FX REDUCE BONE BX UNI/BIL CANNULATION INC/IMAGING  10/06/2022   IR RADIOLOGIST EVAL & MGMT  09/20/2022   IR RADIOLOGIST EVAL & MGMT  10/13/2022   IR RADIOLOGIST EVAL & MGMT  10/20/2022   LOWER EXTREMITY ANGIOGRAPHY Left 08/08/2023   Procedure: Lower Extremity Angiography;  Surgeon: Jama Cordella MATSU, MD;  Location: [facility] INVASIVE CV LAB;  Service: Cardiovascular;  Laterality: Left;   LOWER EXTREMITY INTERVENTION Left 08/08/2023   Procedure: LOWER EXTREMITY INTERVENTION;  Surgeon: Jama Cordella MATSU, MD;  Location: [facility] INVASIVE CV LAB;  Service: Cardiovascular;  Laterality: Left;    Allergies: Allergies as of 11/07/2023 - Review Complete 11/07/2023  Allergen Reaction Noted   Bee venom Anaphylaxis 04/07/2021    Medications: Outpatient Encounter Medications as of 11/07/2023  Medication Sig   aspirin  EC 81 MG tablet Take 81 mg by  mouth daily. Swallow whole.   atorvastatin  (LIPITOR) 20 MG tablet Take 1 tablet (20 mg total) by mouth daily.   BRILINTA  90 MG TABS tablet Take 90 mg by mouth 2 (two) times daily.   cyclobenzaprine  (FLEXERIL ) 5 MG tablet Take 1 tablet (5 mg total) by mouth 3 (three) times daily as needed for muscle spasms.   DULoxetine  (CYMBALTA ) 30 MG capsule Take 30 mg by mouth daily.   Empagliflozin (JARDIANCE PO) Take 25 mg by mouth daily.   gabapentin  (NEURONTIN ) 300 MG capsule Take 300 mg by mouth 3  (three) times daily.   glipiZIDE  (GLUCOTROL ) 5 MG tablet Take 1 tablet (5 mg total) by mouth daily before breakfast.   lisinopril  (ZESTRIL ) 20 MG tablet Take 1 tablet (20 mg total) by mouth daily.   nitroGLYCERIN  (NITROSTAT ) 0.3 MG SL tablet Place 0.3 mg under the tongue every 5 (five) minutes as needed for chest pain.   oxyCODONE  (OXY IR/ROXICODONE ) 5 MG immediate release tablet Take 5 mg by mouth.   pantoprazole  (PROTONIX ) 40 MG tablet Take 1 tablet (40 mg total) by mouth daily.   [DISCONTINUED] oxyCODONE -acetaminophen  (PERCOCET/ROXICET) 5-325 MG tablet Take 1 tablet by mouth every 8 (eight) hours as needed for severe pain (pain score 7-10) or moderate pain (pain score 4-6) (OK to take two before bed). OK to take two before bed   No facility-administered encounter medications on file as of 11/07/2023.    Social History: Social History   Tobacco Use   Smoking status: Never   Smokeless tobacco: Never  Vaping Use   Vaping status: Never Used  Substance Use Topics   Alcohol use: Yes    Alcohol/week: 12.0 standard drinks of alcohol    Types: 12 Cans of beer per week   Drug use: Yes    Types: Marijuana    Comment: Patient states, I had some this morning.    Family Medical History: No family history on file.  Physical Examination: Vitals:   11/07/23 1427  BP: 122/80   Awake, alert, oriented to person, place, and time.  Speech is clear and fluent. Fund of knowledge is appropriate.   Cranial Nerves: Pupils equal round and reactive to light.  Facial tone is symmetric.    She has no lower lumbar tenderness.   No abnormal lesions on exposed skin.   Strength: Side Iliopsoas Quads Hamstring PF DF EHL  R 5 5 5 5 5 5   L 5 5 5 5 5 5    Clonus is not present.   Bilateral lower extremity sensation is intact to light touch, slightly diminished in lower left leg.   She has slight limp favoring left leg.    Medical Decision Making  Imaging: none  Assessment and Plan: Ms.  Mills is a pleasant 53 y.o. female who had L1 kyphoplasty on 10/06/22. She was doing well after her procedure with no pain until a week later when she tripped over her cat on 10/11/22.   She has good days and bad days. She has intermittent LBP along with intermittent weakness and tingling in her left leg. Symptoms in left leg have improved since vascular procedure.    She has known lumbar spondylosis with DDD, moderate central stenosis L4-L5 with left lateral recess stenosis and mild/moderate foraminal stenosis on left at L5-S1.   Seen in ED at [facility] yesterday for purple toes on left foot- had CTA done showing occlusion of stent on left. Dr. Jama wanted to see her without 48-72 hours. She has scheduled  follow up on Thursday.   Treatment options discussed with patient and following plan made:   - She needs to follow up with vascular. I sent a secure chat to Dr. Jama regarding her appointment.  - She will get further work notes from vascular.  - She will follow up with me prn for now. Will call her in 6-8 weeks to check on her progress.   Called patient after her visit after discussing with vascular. She is scheduled with Dr. Jama for angiogram tomorrow. She is aware and will be there.   I spent a total of 15 minutes in face-to-face and non-face-to-face activities related to this patient's care today including review of outside records, review of imaging, review of symptoms, physical exam, discussion of differential diagnosis, discussion of treatment options, and documentation.   Glade Boys PA-C Dept. of Neurosurgery

## 2023-11-06 DIAGNOSIS — I10 Essential (primary) hypertension: Secondary | ICD-10-CM | POA: Diagnosis not present

## 2023-11-06 DIAGNOSIS — Z1159 Encounter for screening for other viral diseases: Secondary | ICD-10-CM | POA: Diagnosis not present

## 2023-11-06 DIAGNOSIS — Z1389 Encounter for screening for other disorder: Secondary | ICD-10-CM | POA: Diagnosis not present

## 2023-11-06 DIAGNOSIS — Z7982 Long term (current) use of aspirin: Secondary | ICD-10-CM | POA: Diagnosis not present

## 2023-11-06 DIAGNOSIS — E7849 Other hyperlipidemia: Secondary | ICD-10-CM | POA: Diagnosis not present

## 2023-11-06 DIAGNOSIS — Z7902 Long term (current) use of antithrombotics/antiplatelets: Secondary | ICD-10-CM | POA: Diagnosis not present

## 2023-11-06 DIAGNOSIS — Z712 Person consulting for explanation of examination or test findings: Secondary | ICD-10-CM | POA: Diagnosis not present

## 2023-11-06 DIAGNOSIS — Z0131 Encounter for examination of blood pressure with abnormal findings: Secondary | ICD-10-CM | POA: Diagnosis not present

## 2023-11-06 DIAGNOSIS — Z87891 Personal history of nicotine dependence: Secondary | ICD-10-CM | POA: Diagnosis not present

## 2023-11-06 DIAGNOSIS — I70202 Unspecified atherosclerosis of native arteries of extremities, left leg: Secondary | ICD-10-CM | POA: Diagnosis not present

## 2023-11-06 DIAGNOSIS — E119 Type 2 diabetes mellitus without complications: Secondary | ICD-10-CM | POA: Diagnosis not present

## 2023-11-06 DIAGNOSIS — I739 Peripheral vascular disease, unspecified: Secondary | ICD-10-CM | POA: Diagnosis not present

## 2023-11-06 DIAGNOSIS — Z8249 Family history of ischemic heart disease and other diseases of the circulatory system: Secondary | ICD-10-CM | POA: Diagnosis not present

## 2023-11-06 DIAGNOSIS — Z0000 Encounter for general adult medical examination without abnormal findings: Secondary | ICD-10-CM | POA: Diagnosis not present

## 2023-11-06 DIAGNOSIS — Z7984 Long term (current) use of oral hypoglycemic drugs: Secondary | ICD-10-CM | POA: Diagnosis not present

## 2023-11-06 DIAGNOSIS — E1151 Type 2 diabetes mellitus with diabetic peripheral angiopathy without gangrene: Secondary | ICD-10-CM | POA: Diagnosis not present

## 2023-11-06 DIAGNOSIS — Z124 Encounter for screening for malignant neoplasm of cervix: Secondary | ICD-10-CM | POA: Diagnosis not present

## 2023-11-06 DIAGNOSIS — Z0130 Encounter for examination of blood pressure without abnormal findings: Secondary | ICD-10-CM | POA: Diagnosis not present

## 2023-11-06 DIAGNOSIS — E1140 Type 2 diabetes mellitus with diabetic neuropathy, unspecified: Secondary | ICD-10-CM | POA: Diagnosis not present

## 2023-11-06 DIAGNOSIS — M79675 Pain in left toe(s): Secondary | ICD-10-CM | POA: Diagnosis not present

## 2023-11-06 NOTE — ED Provider Notes (Signed)
 [facility] Emergency Department Provider Note  HPI & Impression   Final diagnoses:  Toe pain, left (Primary)    History of Present Illness Claudia Mills is a 53 year old female with diabetes and hypertension who presents with toe pain. She is accompanied by her niece. She was advised by her primary care doctor to seek emergency care if her condition worsened.  The left great toe and second toe exhibit progressive darkening, changing from light purple to deep, dark purple, almost black, worsening throughout the day. Pain is present in the left great toe and second toe, with a slightly decreased range of motion in the left great toe. Temporary relief occurs when rolling her toes, causing the discoloration to briefly disappear before returning within 60 to 90 seconds.  Approximately one and a half to two months ago, she underwent placement of a left SFA stent and a left extended iliac artery stent. Follow-up appointments have been repeatedly rescheduled.  She is currently taking Brilinta  and baby aspirin , with her last dose of Brilinta  taken around 1:30 to 2:00 PM today.  MDM   Vitals: BP 134/71   Pulse 101   Temp 36.6 C (97.9 F) (Oral)   Resp 18   Ht 160 cm (5' 3)   Wt 61.7 kg (136 lb)   SpO2 97%   BMI 24.09 kg/m    Medical Decision Making 53 year old female with a history of diabetes and hypertension s/p L SFA and external iliac stenting presented with progressive discoloration and pain of the left great and second toes, worsening over the day from light purple to deep dark purple/black. She has a history of left SFA and extended iliac artery stents placed in June, with delayed follow-up. Exam revealed dusky appearance, delayed capillary refill, decreased range of motion, and monophasic dopplerable flow in the affected toes, raising concern for poor perfusion.  Acute limb ischemia due to peripheral arterial disease - Consult vascular surgery for recommendations - Order CT  with contrast and runoff to assess blood flow to the toe  Diagnostics: Orders Placed This Encounter  Procedures  . CTA Lower Extremity Left W Wo Contrast  . CBC w/ Differential  . Basic Metabolic Panel  . PT-INR  . APTT  . Insert peripheral IV    ED Course as of 11/07/23 0356  Tue Nov 07, 2023  0031 Discussed case with vascular surgeon Dr. Ranae at [facility] who feels that the patient's presentation is most consistent with progression of her chronic symptoms given that she had occlusions of her stents on prior PVLs.  Will reengage with vascular surgery following CTA lower extremity  0350 CTA showing occlusion of left SFA stent with narrowing of the left external iliac artery.  Discussed case with: Vascular surgery, Dr. Jama who performed the patient's stenting in June of this year.  He recommends following up in his clinic to intervene on the occluded stent within the next 48 to 72 hours.  He will call to arrange this in the morning.  Patient is also happy with this plan.  Will proceed with discharge at this time    The case was discussed with the attending physician who is in agreement with the above assessment and plan.  Additional MDM Elements     Discussion with other professionals: See MDM above Independent interpretation: See MDM above          Physical Exam   Vitals:   11/06/23 2229 11/06/23 2232 11/07/23 0100 11/07/23 0101  BP:  155/75 134/71   Pulse:  112 100 101  Resp:  18 17 18   Temp:  36.6 C (97.9 F)    TempSrc:  Oral    SpO2:  100%  97%  Weight: 61.7 kg (136 lb)     Height:  160 cm (5' 3)       Physical Exam CONSTITUTIONAL: Well-appearing, NAD. HEENT: Normocephalic and atraumatic. Conjunctivae are normal. EOMI. Mucous membranes are moist. NECK: Full active ROM. CARDIOVASCULAR: Extremities are warm and well-perfused. RESPIRATORY: Speaking easily in full sentences without audible stridor or wheezing. Equal chest rise without increased  work of breathing. NEUROLOGIC: No facial asymmetry. Normal speech and language. Moving all extremities symmetrically, spontaneously. SKIN: Skin is warm, dry. PSYCHIATRIC: Mood and affect are normal. EXTREMITIES: Left great toe and second toe mildly dusky with delayed capillary refill. No palpable PT or DP pulses in left foot. Monophasic dopplerable flow in left foot.   Past History   PAST MEDICAL HISTORY/PAST SURGICAL HISTORY:  Past Medical History[1]  Past Surgical History[2]  MEDICATIONS:  No current facility-administered medications for this encounter.  Current Outpatient Medications:  .  aspirin  81 MG chewable tablet, Chew 1 tablet (81 mg total) daily., Disp: 30 tablet, Rfl: 0 .  atorvastatin  (LIPITOR) 80 MG tablet, Take 1 tablet (80 mg total) by mouth daily., Disp: 30 tablet, Rfl: 0 .  blood sugar diagnostic (ON CALL EXPRESS TEST STRIP) Strp, use as directed (Patient not taking: Reported on 04/23/2021), Disp: 100 each, Rfl: 0 .  blood-glucose meter kit, Use as instructed, Disp: 1 each, Rfl: 0 .  DULoxetine  (CYMBALTA ) 30 MG capsule, Take 1 capsule by mouth once daily, Disp: 90 capsule, Rfl: 0 .  empagliflozin (JARDIANCE) 25 mg tablet, Take 1 tablet (25 mg total) by mouth daily., Disp: 30 tablet, Rfl: 0 .  gabapentin  (NEURONTIN ) 300 MG capsule, Take 1 capsule (300 mg total) by mouth nightly., Disp: , Rfl:  .  glipiZIDE  (GLUCOTROL ) 5 MG tablet, Take 1 tablet (5 mg total) by mouth daily before breakfast., Disp: 30 tablet, Rfl: 0 .  lancets (ON CALL LANCET) 30 gauge Misc, use as directed (Patient not taking: Reported on 04/23/2021), Disp: 100 each, Rfl: 0 .  lancing device (ON CALL LANCING DEVICE) Misc, use as directed (Patient not taking: Reported on 04/23/2021), Disp: 1 each, Rfl: 0 .  lisinopril  (PRINIVIL ,ZESTRIL ) 5 MG tablet, Take 1 tablet (5 mg total) by mouth daily., Disp: 30 tablet, Rfl: 0 .  loratadine  (CLARITIN ) 10 mg tablet, Take 1 tablet (10 mg total) by mouth daily. (Patient not  taking: Reported on 04/23/2021), Disp: , Rfl:  .  metFORMIN  (GLUCOPHAGE -XR) 500 MG 24 hr tablet, Take 3 tablets (1,500 mg total) by mouth daily with evening meal. (Patient not taking: Reported on 01/21/2022), Disp: 180 tablet, Rfl: 2 .  metoprolol  succinate (TOPROL -XL) 100 MG 24 hr tablet, Take 1 tablet (100 mg total) by mouth daily., Disp: , Rfl:  .  ondansetron  (ZOFRAN -ODT) 4 MG disintegrating tablet, Take 1 tablet (4 mg total) by mouth every twelve (12) hours as needed., Disp: , Rfl:  .  oxyCODONE  (ROXICODONE ) 5 MG immediate release tablet, Take 1 tablet (5 mg total) by mouth every four (4) hours as needed for pain for up to 5 days., Disp: 10 tablet, Rfl: 0 .  ticagrelor  (BRILINTA ) 90 mg Tab, Take 1 tablet (90 mg total) by mouth two (2) times a day., Disp: 60 tablet, Rfl: 0  ALLERGIES:  Venom-honey bee and Yellow jacket venom  SOCIAL  HISTORY:  Social History   Tobacco Use  . Smoking status: Never  . Smokeless tobacco: Former  Substance Use Topics  . Alcohol use: Yes    Comment: occasionally    FAMILY HISTORY: Family History[3]    Radiology   CTA Lower Extremity Left W Wo Contrast  Preliminary Result  -- Occlusion of the stented superficial femoral artery through to the proximal popliteal artery with distal reconstitution from collateral vessels.  -- The left external iliac artery stent is patent though there is luminal narrowing.  --Limited evaluation of the posterior tibial artery due to prominent vascular calcifications, possibly partially occluded.      ---------------------------------  The preliminary findings of this study were discussed via telephone with DR. Collyn Murray by Dr. Vernell Peach on 11/07/2023 at 3:25 AM.    -----------------------------------------------             Laboratory Data   Lab Results  Component Value Date   WBC 7.9 11/07/2023   HGB 15.4 (H) 11/07/2023   HCT 44.0 11/07/2023   PLT 212 11/07/2023    Lab Results  Component Value Date    NA 137 11/07/2023   K 3.2 (L) 11/07/2023   CL 103 11/07/2023   CO2 20.4 11/07/2023   BUN <5 (L) 11/07/2023   CREATININE 0.58 11/07/2023   GLU 112 11/07/2023   CALCIUM  9.3 11/07/2023   MG 1.9 06/24/2023    Lab Results  Component Value Date   BILITOT 0.6 06/24/2023   BILIDIR 0.10 10/24/2022   PROT 8.1 06/24/2023   ALBUMIN 4.5 06/24/2023   ALT  06/24/2023     Comment:     hemolyzed   AST 41 (H) 06/24/2023   ALKPHOS 107 06/24/2023    Lab Results  Component Value Date   INR 0.88 11/07/2023   APTT 28.9 11/07/2023    Bernardino Able, DO [employer]   Emergency Medicine, PGY3  Portions of this record have been created using [employer]. Dictation errors have been sought, but may not have been identified and corrected.        [1] Past Medical History: Diagnosis Date  . Depression   . Diabetes mellitus    (CMS-HCC)   . Heart attack    (CMS-HCC) 01/2019  . Hypertension   [2] Past Surgical History: Procedure Laterality Date  . PR CATH PLACE/CORON ANGIO, IMG SUPER/INTERP,W LEFT HEART VENTRICULOGRAPHY N/A 01/31/2019   Procedure: CATH LEFT HEART CATHETERIZATION W INTERVENTION;  Surgeon: Donnice Beverley Chester, MD;  Location: [facility] CATH;  Service: Cardiology  [3] Family History Problem Relation Age of Onset  . Heart attack Mother 9  . Hypertension Mother   . Heart disease Father 50       Heart attack  . Endometriosis Sister   . Heart disease Brother   . No Known Problems Son   . Cerebral aneurysm Son 11  . No Known Problems Son    Able Bernardino PARAS, [place] Resident 11/07/23 [phone]

## 2023-11-07 ENCOUNTER — Telehealth (INDEPENDENT_AMBULATORY_CARE_PROVIDER_SITE_OTHER): Payer: Self-pay

## 2023-11-07 ENCOUNTER — Encounter (HOSPITAL_COMMUNITY): Payer: Self-pay

## 2023-11-07 ENCOUNTER — Ambulatory Visit: Admission: RE | Admit: 2023-11-07 | Source: Home / Self Care | Admitting: Vascular Surgery

## 2023-11-07 ENCOUNTER — Ambulatory Visit (INDEPENDENT_AMBULATORY_CARE_PROVIDER_SITE_OTHER): Admitting: Orthopedic Surgery

## 2023-11-07 ENCOUNTER — Encounter: Payer: Self-pay | Admitting: Orthopedic Surgery

## 2023-11-07 ENCOUNTER — Encounter: Admission: RE | Payer: Self-pay | Source: Home / Self Care

## 2023-11-07 VITALS — BP 122/80 | Ht 63.0 in | Wt 131.2 lb

## 2023-11-07 DIAGNOSIS — M48061 Spinal stenosis, lumbar region without neurogenic claudication: Secondary | ICD-10-CM | POA: Diagnosis not present

## 2023-11-07 DIAGNOSIS — M47816 Spondylosis without myelopathy or radiculopathy, lumbar region: Secondary | ICD-10-CM

## 2023-11-07 DIAGNOSIS — M5136 Other intervertebral disc degeneration, lumbar region with discogenic back pain only: Secondary | ICD-10-CM | POA: Diagnosis not present

## 2023-11-07 DIAGNOSIS — I70202 Unspecified atherosclerosis of native arteries of extremities, left leg: Secondary | ICD-10-CM | POA: Diagnosis not present

## 2023-11-07 DIAGNOSIS — I70229 Atherosclerosis of native arteries of extremities with rest pain, unspecified extremity: Secondary | ICD-10-CM

## 2023-11-07 DIAGNOSIS — Z124 Encounter for screening for malignant neoplasm of cervix: Secondary | ICD-10-CM | POA: Diagnosis not present

## 2023-11-07 DIAGNOSIS — M48062 Spinal stenosis, lumbar region with neurogenic claudication: Secondary | ICD-10-CM

## 2023-11-07 SURGERY — LOWER EXTREMITY ANGIOGRAPHY
Anesthesia: Moderate Sedation | Site: Leg Lower | Laterality: Left

## 2023-11-07 NOTE — Patient Instructions (Signed)
 It was so nice to see you today. Thank you so much for coming in.    Call Dr. Jama at [phone] to make sure he does not need to see you before Thursday. I sent a message and will let you know if I hear back from them. Get further work notes from him.   Dr. Charolotte number is [phone] (pain  management).   We will call you in 6-8 weeks to check on you.   Please do not hesitate to call if you have any questions or concerns. You can also message me in MyChart.   Glade Boys PA-C [phone]     The physicians and staff at [facility] at [facility] are committed to providing excellent care. You may receive a survey asking for feedback about your experience at our office. We value you your feedback and appreciate you taking the time to to fill it out. The [facility] leadership team is also available to discuss your experience in person, feel free to contact us  [phone].

## 2023-11-07 NOTE — Telephone Encounter (Signed)
 Spoke with the patient and she is scheduled with Dr. Jama for a LLE angio at the [facility] on 11/08/23 with a 12:45 pm arrival time. Pre-procedure instructions were discussed and patient stated she understood.

## 2023-11-07 NOTE — ED Notes (Signed)
 [facility] Emergency Department Attestation Note     ED Clinical Impression    Final diagnoses:  Toe pain, left (Primary)      ED Attending Physician Teaching Attestation    I supervised care provided by the resident. We have discussed the case, I have reviewed the note and I agree with the plan of treatment except as documented in my note.  I have personally performed a face-to-face diagnostic evaluation on this patient.     ED Attending Note    ED Triage Vitals  Enc Vitals Group     BP 11/06/23 2232 155/75     Pulse 11/06/23 2232 112     SpO2 Pulse 11/07/23 0101 101     Resp 11/06/23 2232 18     Temp 11/06/23 2232 36.6 C (97.9 F)     Temp Source 11/06/23 2232 Oral     SpO2 11/06/23 2232 100 %     Weight 11/06/23 2229 61.7 kg (136 lb)     Height 11/06/23 2232 1.6 m (5' 3)     Claudia Mills is a 53 y.o. female with a past medical history of HTN, DM, PAD status post left SFA and external iliac stenting who presents for evaluation of pain and discoloration to her left 1st and 2nd toe.  Light purple to dark purple.  Not extending into foot or ankle.  Denies any trauma.  Has been compliant with Brilinta  and aspirin .  Has not had recent follow-up with her vascular surgeon but due for later this week.  Patient with dopplerable PT in DP pulses.  Concern for stent occlusion.  Will plan on labs, CTA imaging of lower extremity, no access to arterial duplex at this time.  Will plan to discuss with [facility] vascular surgery versus [facility] vascular surgery who are her primary vascular team. Dispo pending results.        See chart and resident provider documentation for details.    Additional Medical Decision Making   I have reviewed the patient's vital signs and the nursing notes. Any pertinent labs & imaging results which were available during my care of the patient were reviewed by me.

## 2023-11-08 ENCOUNTER — Other Ambulatory Visit: Payer: Self-pay

## 2023-11-08 ENCOUNTER — Encounter: Payer: Self-pay | Admitting: Vascular Surgery

## 2023-11-08 ENCOUNTER — Inpatient Hospital Stay
Admission: AD | Admit: 2023-11-08 | Discharge: 2023-11-23 | DRG: 271 | Disposition: A | Discharge status: Skilled Nursing Facility | Attending: Vascular Surgery | Admitting: Vascular Surgery

## 2023-11-08 ENCOUNTER — Encounter
Admission: AD | Disposition: A | Discharge status: Skilled Nursing Facility | Payer: Self-pay | Source: Home / Self Care | Attending: Vascular Surgery

## 2023-11-08 DIAGNOSIS — Z79899 Other long term (current) drug therapy: Secondary | ICD-10-CM | POA: Diagnosis not present

## 2023-11-08 DIAGNOSIS — I743 Embolism and thrombosis of arteries of the lower extremities: Secondary | ICD-10-CM | POA: Diagnosis present

## 2023-11-08 DIAGNOSIS — I2510 Atherosclerotic heart disease of native coronary artery without angina pectoris: Secondary | ICD-10-CM | POA: Diagnosis present

## 2023-11-08 DIAGNOSIS — I70223 Atherosclerosis of native arteries of extremities with rest pain, bilateral legs: Principal | ICD-10-CM | POA: Diagnosis present

## 2023-11-08 DIAGNOSIS — Z9889 Other specified postprocedural states: Secondary | ICD-10-CM | POA: Diagnosis not present

## 2023-11-08 DIAGNOSIS — Y831 Surgical operation with implant of artificial internal device as the cause of abnormal reaction of the patient, or of later complication, without mention of misadventure at the time of the procedure: Secondary | ICD-10-CM | POA: Diagnosis present

## 2023-11-08 DIAGNOSIS — Z7902 Long term (current) use of antithrombotics/antiplatelets: Secondary | ICD-10-CM

## 2023-11-08 DIAGNOSIS — I70222 Atherosclerosis of native arteries of extremities with rest pain, left leg: Secondary | ICD-10-CM | POA: Diagnosis not present

## 2023-11-08 DIAGNOSIS — I10 Essential (primary) hypertension: Secondary | ICD-10-CM | POA: Diagnosis not present

## 2023-11-08 DIAGNOSIS — I70201 Unspecified atherosclerosis of native arteries of extremities, right leg: Secondary | ICD-10-CM | POA: Diagnosis not present

## 2023-11-08 DIAGNOSIS — T82856A Stenosis of peripheral vascular stent, initial encounter: Principal | ICD-10-CM | POA: Diagnosis present

## 2023-11-08 DIAGNOSIS — Z91030 Bee allergy status: Secondary | ICD-10-CM | POA: Diagnosis not present

## 2023-11-08 DIAGNOSIS — Z794 Long term (current) use of insulin: Secondary | ICD-10-CM | POA: Diagnosis not present

## 2023-11-08 DIAGNOSIS — K219 Gastro-esophageal reflux disease without esophagitis: Secondary | ICD-10-CM | POA: Diagnosis present

## 2023-11-08 DIAGNOSIS — I700 Atherosclerosis of aorta: Secondary | ICD-10-CM

## 2023-11-08 DIAGNOSIS — I252 Old myocardial infarction: Secondary | ICD-10-CM | POA: Diagnosis not present

## 2023-11-08 DIAGNOSIS — I70229 Atherosclerosis of native arteries of extremities with rest pain, unspecified extremity: Secondary | ICD-10-CM

## 2023-11-08 DIAGNOSIS — Z955 Presence of coronary angioplasty implant and graft: Secondary | ICD-10-CM

## 2023-11-08 DIAGNOSIS — E1165 Type 2 diabetes mellitus with hyperglycemia: Secondary | ICD-10-CM | POA: Diagnosis not present

## 2023-11-08 DIAGNOSIS — E1151 Type 2 diabetes mellitus with diabetic peripheral angiopathy without gangrene: Secondary | ICD-10-CM | POA: Diagnosis present

## 2023-11-08 DIAGNOSIS — T82868A Thrombosis of vascular prosthetic devices, implants and grafts, initial encounter: Secondary | ICD-10-CM | POA: Diagnosis not present

## 2023-11-08 DIAGNOSIS — Z1211 Encounter for screening for malignant neoplasm of colon: Secondary | ICD-10-CM

## 2023-11-08 DIAGNOSIS — Z7984 Long term (current) use of oral hypoglycemic drugs: Secondary | ICD-10-CM | POA: Diagnosis not present

## 2023-11-08 DIAGNOSIS — Z7982 Long term (current) use of aspirin: Secondary | ICD-10-CM

## 2023-11-08 DIAGNOSIS — E1140 Type 2 diabetes mellitus with diabetic neuropathy, unspecified: Secondary | ICD-10-CM | POA: Diagnosis not present

## 2023-11-08 HISTORY — DX: Peripheral vascular disease, unspecified: I73.9

## 2023-11-08 HISTORY — DX: Atherosclerotic heart disease of native coronary artery without angina pectoris: I25.10

## 2023-11-08 HISTORY — PX: LOWER EXTREMITY ANGIOGRAPHY: CATH118251

## 2023-11-08 LAB — APTT: aPTT: 111 s — ABNORMAL HIGH (ref 24–36)

## 2023-11-08 LAB — HEMOGLOBIN A1C
Hgb A1c MFr Bld: 7.2 % — ABNORMAL HIGH (ref 4.8–5.6)
Mean Plasma Glucose: 159.94 mg/dL

## 2023-11-08 LAB — ABO/RH: ABO/RH(D): O POS

## 2023-11-08 LAB — GLUCOSE, CAPILLARY
Glucose-Capillary: 170 mg/dL — ABNORMAL HIGH (ref 70–99)
Glucose-Capillary: 186 mg/dL — ABNORMAL HIGH (ref 70–99)
Glucose-Capillary: 115 mg/dL — ABNORMAL HIGH (ref 70–99)
Glucose-Capillary: 139 mg/dL — ABNORMAL HIGH (ref 70–99)

## 2023-11-08 LAB — BUN: BUN: 6 mg/dL (ref 6–20)

## 2023-11-08 LAB — HEPARIN LEVEL (UNFRACTIONATED): Heparin Unfractionated: 0.36 [IU]/mL (ref 0.30–0.70)

## 2023-11-08 LAB — CREATININE, SERUM
Creatinine, Ser: 0.79 mg/dL (ref 0.44–1.00)
GFR, Estimated: >60 mL/min (ref >60)

## 2023-11-08 LAB — PROTIME-INR
INR: 1 (ref 0.8–1.2)
Prothrombin Time: 14.2 s (ref 11.4–15.2)

## 2023-11-08 SURGERY — LOWER EXTREMITY ANGIOGRAPHY
Anesthesia: Moderate Sedation | Site: Leg Lower | Laterality: Left

## 2023-11-08 MED ORDER — MIDAZOLAM HCL 2 MG/2ML IJ SOLN
INTRAMUSCULAR | Status: DC | PRN
  Administered 2023-11-08 (×3): 1 mg via INTRAVENOUS
  Administered 2023-11-08: 2 mg via INTRAVENOUS
  Administered 2023-11-08 (×3): 1 mg via INTRAVENOUS

## 2023-11-08 MED ORDER — SODIUM CHLORIDE 0.9 % IV SOLN: INTRAVENOUS | Status: DC

## 2023-11-08 MED ORDER — LIDOCAINE HCL (PF) 1 % IJ SOLN
INTRAMUSCULAR | Status: DC | PRN
  Administered 2023-11-08: 10 mL

## 2023-11-08 MED ORDER — LABETALOL HCL 5 MG/ML IV SOLN: 10.0000 mg | INTRAVENOUS | Status: DC | PRN

## 2023-11-08 MED ORDER — MIDAZOLAM HCL 5 MG/5ML IJ SOLN
INTRAMUSCULAR | Status: AC
  Filled 2023-11-08: qty 5

## 2023-11-08 MED ORDER — GABAPENTIN 300 MG PO CAPS
300.0000 mg | ORAL_CAPSULE | Freq: Three times a day (TID) | ORAL | Status: DC
  Administered 2023-11-08 – 2023-11-23 (×42): 300 mg via ORAL
  Filled 2023-11-08 (×43): qty 1

## 2023-11-08 MED ORDER — MIDAZOLAM HCL 2 MG/2ML IJ SOLN
INTRAMUSCULAR | Status: AC
  Filled 2023-11-08: qty 2

## 2023-11-08 MED ORDER — HYDROMORPHONE HCL 1 MG/ML IJ SOLN
1.0000 mg | Freq: Once | INTRAMUSCULAR | Status: AC | PRN
  Administered 2023-11-08: 1 mg via INTRAVENOUS

## 2023-11-08 MED ORDER — DIPHENHYDRAMINE HCL 50 MG/ML IJ SOLN: 50.0000 mg | Freq: Once | INTRAMUSCULAR | Status: DC | PRN

## 2023-11-08 MED ORDER — PANTOPRAZOLE SODIUM 40 MG PO TBEC
40.0000 mg | DELAYED_RELEASE_TABLET | Freq: Every day | ORAL | Status: DC
  Administered 2023-11-09 – 2023-11-23 (×14): 40 mg via ORAL
  Filled 2023-11-08 (×14): qty 1

## 2023-11-08 MED ORDER — INSULIN ASPART 100 UNIT/ML IJ SOLN
0.0000 [IU] | Freq: Every day | INTRAMUSCULAR | Status: DC
  Administered 2023-11-14 – 2023-11-22 (×2): 2 [IU] via SUBCUTANEOUS
  Filled 2023-11-08 (×3): qty 1

## 2023-11-08 MED ORDER — SODIUM CHLORIDE 0.9% IV SOLUTION: Freq: Once | INTRAVENOUS | Status: DC

## 2023-11-08 MED ORDER — SODIUM CHLORIDE 0.9% FLUSH
3.0000 mL | Freq: Two times a day (BID) | INTRAVENOUS | Status: DC
  Administered 2023-11-08 – 2023-11-23 (×24): 3 mL via INTRAVENOUS

## 2023-11-08 MED ORDER — HYDRALAZINE HCL 20 MG/ML IJ SOLN: 5.0000 mg | INTRAMUSCULAR | Status: DC | PRN

## 2023-11-08 MED ORDER — CEFAZOLIN SODIUM-DEXTROSE 2-4 GM/100ML-% IV SOLN
2.0000 g | INTRAVENOUS | Status: AC
  Administered 2023-11-08: 2 g via INTRAVENOUS

## 2023-11-08 MED ORDER — HEPARIN (PORCINE) 25000 UT/250ML-% IV SOLN: 1100.0000 [IU]/h | INTRAVENOUS | Status: DC

## 2023-11-08 MED ORDER — HEPARIN (PORCINE) IN NACL 1000-0.9 UT/500ML-% IV SOLN
INTRAVENOUS | Status: DC | PRN
  Administered 2023-11-08: 1000 mL

## 2023-11-08 MED ORDER — NITROGLYCERIN 0.4 MG SL SUBL: 0.4000 mg | SUBLINGUAL_TABLET | SUBLINGUAL | Status: DC | PRN

## 2023-11-08 MED ORDER — SODIUM CHLORIDE 0.9 % IV SOLN: 250.0000 mL | INTRAVENOUS | Status: DC | PRN

## 2023-11-08 MED ORDER — FENTANYL CITRATE PF 50 MCG/ML IJ SOSY
PREFILLED_SYRINGE | INTRAMUSCULAR | Status: AC
  Filled 2023-11-08: qty 1

## 2023-11-08 MED ORDER — ONDANSETRON HCL 4 MG/2ML IJ SOLN: 4.0000 mg | Freq: Four times a day (QID) | INTRAMUSCULAR | Status: DC | PRN

## 2023-11-08 MED ORDER — FENTANYL CITRATE (PF) 100 MCG/2ML IJ SOLN
INTRAMUSCULAR | Status: DC | PRN
  Administered 2023-11-08: 50 ug via INTRAVENOUS
  Administered 2023-11-08 (×3): 25 ug via INTRAVENOUS
  Administered 2023-11-08: 50 ug via INTRAVENOUS
  Administered 2023-11-08: 25 ug via INTRAVENOUS
  Administered 2023-11-08: 50 ug via INTRAVENOUS

## 2023-11-08 MED ORDER — ACETAMINOPHEN 325 MG PO TABS: 650.0000 mg | ORAL_TABLET | ORAL | Status: DC | PRN

## 2023-11-08 MED ORDER — SODIUM CHLORIDE 0.9 % IV SOLN: INTRAVENOUS | Status: AC

## 2023-11-08 MED ORDER — FENTANYL CITRATE (PF) 100 MCG/2ML IJ SOLN
INTRAMUSCULAR | Status: AC
  Filled 2023-11-08: qty 2

## 2023-11-08 MED ORDER — MIDAZOLAM HCL 2 MG/ML PO SYRP: 8.0000 mg | ORAL_SOLUTION | Freq: Once | ORAL | Status: DC | PRN

## 2023-11-08 MED ORDER — METHYLPREDNISOLONE SODIUM SUCC 125 MG IJ SOLR: 125.0000 mg | Freq: Once | INTRAMUSCULAR | Status: DC | PRN

## 2023-11-08 MED ORDER — INSULIN ASPART 100 UNIT/ML IJ SOLN
0.0000 [IU] | Freq: Three times a day (TID) | INTRAMUSCULAR | Status: DC
  Administered 2023-11-08: 3 [IU] via SUBCUTANEOUS
  Administered 2023-11-09: 2 [IU] via SUBCUTANEOUS
  Administered 2023-11-09: 5 [IU] via SUBCUTANEOUS
  Administered 2023-11-10 – 2023-11-12 (×7): 2 [IU] via SUBCUTANEOUS
  Administered 2023-11-12: 3 [IU] via SUBCUTANEOUS
  Administered 2023-11-13: 2 [IU] via SUBCUTANEOUS
  Administered 2023-11-13: 5 [IU] via SUBCUTANEOUS
  Administered 2023-11-14: 3 [IU] via SUBCUTANEOUS
  Administered 2023-11-14: 2 [IU] via SUBCUTANEOUS
  Administered 2023-11-15 (×2): 3 [IU] via SUBCUTANEOUS
  Administered 2023-11-16: 5 [IU] via SUBCUTANEOUS
  Administered 2023-11-17: 3 [IU] via SUBCUTANEOUS
  Administered 2023-11-17: 2 [IU] via SUBCUTANEOUS
  Administered 2023-11-17 – 2023-11-18 (×2): 3 [IU] via SUBCUTANEOUS
  Administered 2023-11-18: 8 [IU] via SUBCUTANEOUS
  Administered 2023-11-18: 2 [IU] via SUBCUTANEOUS
  Administered 2023-11-19 – 2023-11-20 (×3): 3 [IU] via SUBCUTANEOUS
  Administered 2023-11-20: 5 [IU] via SUBCUTANEOUS
  Administered 2023-11-21 – 2023-11-22 (×3): 3 [IU] via SUBCUTANEOUS
  Administered 2023-11-22: 2 [IU] via SUBCUTANEOUS
  Administered 2023-11-22 – 2023-11-23 (×3): 3 [IU] via SUBCUTANEOUS
  Filled 2023-11-08 (×36): qty 1

## 2023-11-08 MED ORDER — NITROGLYCERIN 1 MG/10 ML FOR IR/CATH LAB
INTRA_ARTERIAL | Status: DC | PRN
  Administered 2023-11-08: 600 ug via INTRA_ARTERIAL

## 2023-11-08 MED ORDER — HEPARIN SODIUM (PORCINE) 1000 UNIT/ML IJ SOLN
INTRAMUSCULAR | Status: DC | PRN
  Administered 2023-11-08: 5000 [IU] via INTRAVENOUS

## 2023-11-08 MED ORDER — HEPARIN (PORCINE) 25000 UT/250ML-% IV SOLN
INTRAVENOUS | Status: AC
  Administered 2023-11-08: 950 [IU]/h via INTRAVENOUS
  Filled 2023-11-08: qty 250

## 2023-11-08 MED ORDER — ALTEPLASE 2 MG IJ SOLR
INTRAMUSCULAR | Status: DC | PRN
  Administered 2023-11-08: 2 mg

## 2023-11-08 MED ORDER — DULOXETINE HCL 30 MG PO CPEP
30.0000 mg | ORAL_CAPSULE | Freq: Every day | ORAL | Status: DC
  Administered 2023-11-09 – 2023-11-23 (×14): 30 mg via ORAL
  Filled 2023-11-08 (×14): qty 1

## 2023-11-08 MED ORDER — ASPIRIN 81 MG PO TBEC
81.0000 mg | DELAYED_RELEASE_TABLET | Freq: Every day | ORAL | Status: DC
  Administered 2023-11-09 – 2023-11-23 (×14): 81 mg via ORAL
  Filled 2023-11-08 (×15): qty 1

## 2023-11-08 MED ORDER — CEFAZOLIN SODIUM-DEXTROSE 2-4 GM/100ML-% IV SOLN
INTRAVENOUS | Status: AC
  Filled 2023-11-08: qty 100

## 2023-11-08 MED ORDER — ONDANSETRON HCL 4 MG/2ML IJ SOLN
4.0000 mg | Freq: Four times a day (QID) | INTRAMUSCULAR | Status: DC | PRN
  Administered 2023-11-08 – 2023-11-09 (×2): 4 mg via INTRAVENOUS
  Filled 2023-11-08 (×3): qty 2

## 2023-11-08 MED ORDER — HYDROMORPHONE HCL 1 MG/ML IJ SOLN
INTRAMUSCULAR | Status: AC
  Filled 2023-11-08: qty 1

## 2023-11-08 MED ORDER — IODIXANOL 320 MG/ML IV SOLN
INTRAVENOUS | Status: DC | PRN
  Administered 2023-11-08: 65 mL

## 2023-11-08 MED ORDER — FAMOTIDINE 20 MG PO TABS: 40.0000 mg | ORAL_TABLET | Freq: Once | ORAL | Status: DC | PRN

## 2023-11-08 MED ORDER — OXYCODONE HCL 5 MG PO TABS
5.0000 mg | ORAL_TABLET | ORAL | Status: DC | PRN
  Administered 2023-11-08 – 2023-11-09 (×2): 5 mg via ORAL
  Filled 2023-11-08 (×2): qty 1

## 2023-11-08 MED ORDER — LISINOPRIL 20 MG PO TABS
20.0000 mg | ORAL_TABLET | Freq: Every day | ORAL | Status: DC
  Administered 2023-11-09 – 2023-11-23 (×14): 20 mg via ORAL
  Filled 2023-11-08 (×11): qty 1
  Filled 2023-11-08: qty 2
  Filled 2023-11-08: qty 1

## 2023-11-08 MED ORDER — ATORVASTATIN CALCIUM 20 MG PO TABS
20.0000 mg | ORAL_TABLET | Freq: Every day | ORAL | Status: DC
Start: 2023-11-09 — End: 2023-11-23
  Administered 2023-11-09 – 2023-11-23 (×14): 20 mg via ORAL
  Filled 2023-11-08 (×14): qty 1

## 2023-11-08 MED ORDER — SODIUM CHLORIDE 0.9% FLUSH: 3.0000 mL | INTRAVENOUS | Status: DC | PRN

## 2023-11-08 MED ORDER — MORPHINE SULFATE (PF) 2 MG/ML IV SOLN
2.0000 mg | INTRAVENOUS | Status: DC | PRN
  Administered 2023-11-09 – 2023-11-15 (×7): 2 mg via INTRAVENOUS
  Filled 2023-11-08 (×7): qty 1

## 2023-11-08 MED ORDER — HEPARIN SODIUM (PORCINE) 1000 UNIT/ML IJ SOLN
INTRAMUSCULAR | Status: AC
  Filled 2023-11-08: qty 10

## 2023-11-08 SURGICAL SUPPLY — 26 items
BALLOON LUTONIX 018 4X300X130 (BALLOONS) IMPLANT
BALLOON LUTONIX 018 5X80X130 (BALLOONS) IMPLANT
BALLOON LUTONIX 018 6X80X130 (BALLOONS) IMPLANT
BALLOON LUTONIX DCB 7X40X130 (BALLOONS) IMPLANT
CATH ANGIO 5F PIGTAIL 65CM (CATHETERS) IMPLANT
CATH BEACON 5 .038 100 VERT TP (CATHETERS) IMPLANT
CATH BEACON 5.038 65CM KMP-01 (CATHETERS) IMPLANT
CATH INFUS 135X30 (CATHETERS) IMPLANT
CATH ROTAREX 135 6FR (CATHETERS) IMPLANT
COVER PROBE ULTRASOUND 5X96 (MISCELLANEOUS) IMPLANT
DEVICE PRESTO INFLATION (MISCELLANEOUS) IMPLANT
DEVICE VASC CLSR CELT ART 6 (Vascular Products) IMPLANT
GLIDEWIRE ADV .035X260CM (WIRE) IMPLANT
GOWN STRL REUS W/ TWL LRG LVL3 (GOWN DISPOSABLE) ×1 IMPLANT
NDL ENTRY 21GA 7CM ECHOTIP (NEEDLE) IMPLANT
NEEDLE ENTRY 21GA 7CM ECHOTIP (NEEDLE) ×1 IMPLANT
PACK ANGIOGRAPHY (CUSTOM PROCEDURE TRAY) ×1 IMPLANT
SET INTRO CAPELLA COAXIAL (SET/KITS/TRAYS/PACK) IMPLANT
SHEATH ANL2 6FRX45 HC (SHEATH) IMPLANT
SHEATH BRITE TIP 5FRX11 (SHEATH) IMPLANT
SHEATH BRITE TIP 6FRX11 (SHEATH) IMPLANT
STENT LIFESTENT 5F 7X40X135 (Permanent Stent) IMPLANT
SYR MEDRAD MARK 7 150ML (SYRINGE) IMPLANT
TUBING CONTRAST HIGH PRESS 72 (TUBING) IMPLANT
WIRE G V18X300CM (WIRE) IMPLANT
WIRE J 3MM .035X145CM (WIRE) IMPLANT

## 2023-11-08 NOTE — H&P (View-Only) (Signed)
 MRN : [id number]  Claudia Mills is a 53 y.o. (Feb 13, 1971) female who presents with chief complaint of check circulation.  History of Present Illness:   The patient presents to [facility] for treatment of her left lower extremity ischemia.  2 nights ago she presented to [facility] and was evaluated in the emergency room and noted to have changes of the left forefoot consistent with ischemia.  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  The patient notes that there has been a significant deterioration in the lower extremity symptoms.  The patient notes interval shortening of their claudication distance and development of profound rest pain symptoms. No new ulcers or wounds have occurred since the last visit.  There have been no significant changes to the patient's overall health care.  The patient denies amaurosis fugax or recent TIA symptoms. There are no recent neurological changes noted. There is no history of DVT, PE or superficial thrombophlebitis. The patient denies recent episodes of angina or shortness of breath.    Current Meds  Medication Sig   aspirin  EC 81 MG tablet Take 81 mg by mouth daily. Swallow whole.   atorvastatin  (LIPITOR) 20 MG tablet Take 1 tablet (20 mg total) by mouth daily.   BRILINTA  90 MG TABS tablet Take 90 mg by mouth 2 (two) times daily.   DULoxetine  (CYMBALTA ) 30 MG capsule Take 30 mg by mouth daily.   Empagliflozin (JARDIANCE PO) Take 25 mg by mouth daily.   gabapentin  (NEURONTIN ) 300 MG capsule Take 300 mg by mouth 3 (three) times daily.   glipiZIDE  (GLUCOTROL ) 5 MG tablet Take 1 tablet (5 mg total) by mouth daily before breakfast.   lisinopril  (ZESTRIL ) 20 MG tablet Take 1 tablet (20 mg total) by mouth daily.   pantoprazole  (PROTONIX ) 40 MG  tablet Take 1 tablet (40 mg total) by mouth daily.    Past Medical History:  Diagnosis Date   Coronary artery disease    Diabetes mellitus without complication (HCC)    Hypertension    MI (myocardial infarction) (HCC)    PVD (peripheral vascular disease)    Tick bite    Pt states she does not remember daye of tic bite, but that is was since last visit with pain clinic and she was hospitalized for 1 day.    Past Surgical History:  Procedure Laterality Date   CORONARY ANGIOPLASTY WITH STENT PLACEMENT     IR KYPHO LUMBAR INC FX REDUCE BONE BX UNI/BIL CANNULATION INC/IMAGING  10/06/2022   IR RADIOLOGIST EVAL & MGMT  09/20/2022   IR RADIOLOGIST EVAL & MGMT  10/13/2022   IR RADIOLOGIST EVAL & MGMT  10/20/2022   LOWER EXTREMITY ANGIOGRAPHY Left 08/08/2023   Procedure: Lower Extremity Angiography;  Surgeon: Jama Cordella MATSU, MD;  Location: [facility] INVASIVE CV LAB;  Service: Cardiovascular;  Laterality: Left;   LOWER EXTREMITY INTERVENTION Left 08/08/2023   Procedure: LOWER EXTREMITY INTERVENTION;  Surgeon: Jama Cordella MATSU, MD;  Location: [facility] INVASIVE CV  LAB;  Service: Cardiovascular;  Laterality: Left;    Social History Social History   Tobacco Use   Smoking status: Never   Smokeless tobacco: Never  Vaping Use   Vaping status: Never Used  Substance Use Topics   Alcohol use: Yes    Alcohol/week: 12.0 standard drinks of alcohol    Types: 12 Cans of beer per week   Drug use: Yes    Types: Marijuana    Comment: Patient states, I had some this morning.    Family History History reviewed. No pertinent family history.  Allergies  Allergen Reactions   Bee Venom Anaphylaxis     REVIEW OF SYSTEMS (Negative unless checked)  Constitutional: [] Weight loss  [] Fever  [] Chills Cardiac: [] Chest pain   [] Chest pressure   [] Palpitations   [] Shortness of breath when laying flat   [] Shortness of breath with exertion. Vascular:  [x] Pain in legs with walking   [] Pain in legs at rest  [] History  of DVT   [] Phlebitis   [] Swelling in legs   [] Varicose veins   [] Non-healing ulcers Pulmonary:   [] Uses home oxygen   [] Productive cough   [] Hemoptysis   [] Wheeze  [] COPD   [] Asthma Neurologic:  [] Dizziness   [] Seizures   [] History of stroke   [] History of TIA  [] Aphasia   [] Vissual changes   [] Weakness or numbness in arm   [] Weakness or numbness in leg Musculoskeletal:   [] Joint swelling   [] Joint pain   [] Low back pain Hematologic:  [] Easy bruising  [] Easy bleeding   [] Hypercoagulable state   [] Anemic Gastrointestinal:  [] Diarrhea   [] Vomiting  [] Gastroesophageal reflux/heartburn   [] Difficulty swallowing. Genitourinary:  [] Chronic kidney disease   [] Difficult urination  [] Frequent urination   [] Blood in urine Skin:  [] Rashes   [] Ulcers  Psychological:  [] History of anxiety   []  History of major depression.  Physical Examination  Vitals:   11/08/23 1252  BP: 126/88  Resp: 14  Temp: (!) 97.5 F (36.4 C)  TempSrc: Temporal  SpO2: 96%  Weight: 58.3 kg  Height: 5' (1.524 m)   Body mass index is 25.12 kg/m. Gen: WD/WN, NAD Head: [place]/AT, No temporalis wasting.  Ear/Nose/Throat: Hearing grossly intact, nares w/o erythema or drainage Eyes: PER, EOMI, sclera nonicteric.  Neck: Supple, no masses.  No bruit or JVD.  Pulmonary:  Good air movement, no audible wheezing, no use of accessory muscles.  Cardiac: RRR, normal S1, S2, no Murmurs. Vascular:  mild trophic changes, no open wounds Vessel Right Left  Radial Palpable Palpable  PT Not Palpable Not Palpable  DP Not Palpable Not Palpable  Gastrointestinal: soft, non-distended. No guarding/no peritoneal signs.  Musculoskeletal: M/S 5/5 throughout.  No visible deformity.  Neurologic: CN 2-12 intact. Pain and light touch intact in extremities.  Symmetrical.  Speech is fluent. Motor exam as listed above. Psychiatric: Judgment intact, Mood & affect appropriate for pt's clinical situation. Dermatologic: No rashes or ulcers noted.  No changes  consistent with cellulitis.   CBC Lab Results  Component Value Date   WBC 11.9 (H) 10/21/2022   HGB 15.1 (H) 10/21/2022   HCT 42.0 10/21/2022   MCV 94.6 10/21/2022   PLT 397 10/21/2022    BMET    Component Value Date/Time   NA 124 (L) 10/21/2022 1505   K 3.1 (L) 10/21/2022 1505   CL 88 (L) 10/21/2022 1505   CO2 21 (L) 10/21/2022 1505   GLUCOSE 461 (H) 10/21/2022 1505   BUN 9 08/08/2023 1359   CREATININE  0.63 08/08/2023 1359   CREATININE 0.58 09/20/2022 0930   CALCIUM  9.1 10/21/2022 1505   GFRNONAA >60 08/08/2023 1359   GFRAA >60 12/30/2015 2048   CrCl cannot be calculated (Patient's most recent lab result is older than the maximum 21 days allowed.).  COAG Lab Results  Component Value Date   INR 0.9 09/20/2022    Radiology No results found.   Assessment/Plan 1. Atherosclerosis of native artery of both lower extremities with rest pain (HCC) (Primary) Recommend:   The patient has evidence of severe atherosclerotic changes of both lower extremities with rest pain that is associated with preulcerative changes and impending tissue loss of the both feet with the left foot more severe.  This represents a limb threatening ischemia and places the patient at the risk for bilateral limb loss.     Physical examination as well as noninvasive studies are consistent with this finding.  CTA from outside institution has demonstrated SFA occlusions.   Patient should undergo angiography of the left lower extremity initially but then the right lower extremity will need treatment as well.  Angiography is with the hope for intervention for limb salvage.  The risks and benefits as well as the alternative therapies was discussed in detail with the patient.  All questions were answered.  Patient agrees to proceed with left lower extremity angiography first and then right lower extremity angiography.   The patient will follow up with me in the office after the procedure.    I70.223     critical limb ischemia of the lower extremity I70.229    Atherosclerotic occlusive disease with rest pain   CPT codes: 62773   stent placement femoral-popliteal artery 36247   introduction catheter below diaphragm third order   2. NSTEMI (non-ST elevated myocardial infarction) (HCC) Continue cardiac and antihypertensive medications as already ordered and reviewed, no changes at this time.   Continue statin as ordered and reviewed, no changes at this time   Nitrates PRN for chest pain   3. Essential hypertension Continue antihypertensive medications as already ordered, these medications have been reviewed and there are no changes at this time.   4. Type 2 diabetes mellitus with diabetic peripheral angiopathy without gangrene, without long-term current use of insulin  (HCC) Continue hypoglycemic medications as already ordered, these medications have been reviewed and there are no changes at this time.   Hgb A1C to be monitored as already arranged by primary service   5. Gastroesophageal reflux disease without esophagitis Continue PPI as already ordered, this medication has been reviewed and there are no changes at this time.   Avoidence of caffeine and alcohol   Moderate elevation of the head of the bed      [name], MD  11/08/2023 1:34 PM

## 2023-11-08 NOTE — Consult Note (Signed)
 PHARMACY - ANTICOAGULATION CONSULT NOTE  Pharmacy Consult for Heparin  Indication: Limb ischemia  Allergies  Allergen Reactions   Bee Venom Anaphylaxis    Patient Measurements: Height: 5' (152.4 cm) Weight: 58.3 kg (128 lb 9.6 oz) IBW/kg (Calculated) : 45.5 HEPARIN  DW (KG): 57.3  Vital Signs: Temp: 97.7 F (36.5 C) (09/24 2110) Temp Source: Temporal (09/24 1530) BP: 149/70 (09/24 2110) Pulse Rate: 96 (09/24 2110)  Labs: Recent Labs    11/08/23 1611 11/08/23 2224  APTT 111*  --   LABPROT 14.2  --   INR 1.0  --   HEPARINUNFRC  --  0.36  CREATININE 0.79  --     Estimated Creatinine Clearance: 65 mL/min (by C-G formula based on SCr of 0.79 mg/dL).   Medical History: Past Medical History:  Diagnosis Date   Coronary artery disease    Diabetes mellitus without complication (HCC)    Hypertension    MI (myocardial infarction) (HCC)    PVD (peripheral vascular disease)    Tick bite    Pt states she does not remember daye of tic bite, but that is was since last visit with pain clinic and she was hospitalized for 1 day.    Medications:  No history of chronic anticoagulation use PTA.  Assessment: Patient presents to [facility] for treatment of her left lower extremity ischemia. On 11/06/23, she presented to [facility] and was evaluated in the emergency room and noted to have changes of the left forefoot consistent with ischemia. Pharmacy has been consulted to initiate and monitor continuous heparin  infusion. Baseline labs are ordered and pending.  Goal of Therapy:  Heparin  level 0.3-0.7 units/ml Monitor platelets by anticoagulation protocol: Yes   Plan:  9/24:  HL @ 2224 = 0.36, therapeutic X 1 - Will continue pt on current rate and recheck HL in 6 hrs - CBC daily   Keri Tavella D 11/08/2023,10:55 PM

## 2023-11-08 NOTE — Progress Notes (Signed)
 Consent signed.  Pt denies any questions or concerns at this time.

## 2023-11-08 NOTE — Progress Notes (Signed)
 MRN : [id number]  Claudia Mills is a 53 y.o. (Feb 13, 1971) female who presents with chief complaint of check circulation.  History of Present Illness:   The patient presents to [facility] for treatment of her left lower extremity ischemia.  2 nights ago she presented to [facility] and was evaluated in the emergency room and noted to have changes of the left forefoot consistent with ischemia.  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  The patient notes that there has been a significant deterioration in the lower extremity symptoms.  The patient notes interval shortening of their claudication distance and development of profound rest pain symptoms. No new ulcers or wounds have occurred since the last visit.  There have been no significant changes to the patient's overall health care.  The patient denies amaurosis fugax or recent TIA symptoms. There are no recent neurological changes noted. There is no history of DVT, PE or superficial thrombophlebitis. The patient denies recent episodes of angina or shortness of breath.    Current Meds  Medication Sig   aspirin  EC 81 MG tablet Take 81 mg by mouth daily. Swallow whole.   atorvastatin  (LIPITOR) 20 MG tablet Take 1 tablet (20 mg total) by mouth daily.   BRILINTA  90 MG TABS tablet Take 90 mg by mouth 2 (two) times daily.   DULoxetine  (CYMBALTA ) 30 MG capsule Take 30 mg by mouth daily.   Empagliflozin (JARDIANCE PO) Take 25 mg by mouth daily.   gabapentin  (NEURONTIN ) 300 MG capsule Take 300 mg by mouth 3 (three) times daily.   glipiZIDE  (GLUCOTROL ) 5 MG tablet Take 1 tablet (5 mg total) by mouth daily before breakfast.   lisinopril  (ZESTRIL ) 20 MG tablet Take 1 tablet (20 mg total) by mouth daily.   pantoprazole  (PROTONIX ) 40 MG  tablet Take 1 tablet (40 mg total) by mouth daily.    Past Medical History:  Diagnosis Date   Coronary artery disease    Diabetes mellitus without complication (HCC)    Hypertension    MI (myocardial infarction) (HCC)    PVD (peripheral vascular disease)    Tick bite    Pt states she does not remember daye of tic bite, but that is was since last visit with pain clinic and she was hospitalized for 1 day.    Past Surgical History:  Procedure Laterality Date   CORONARY ANGIOPLASTY WITH STENT PLACEMENT     IR KYPHO LUMBAR INC FX REDUCE BONE BX UNI/BIL CANNULATION INC/IMAGING  10/06/2022   IR RADIOLOGIST EVAL & MGMT  09/20/2022   IR RADIOLOGIST EVAL & MGMT  10/13/2022   IR RADIOLOGIST EVAL & MGMT  10/20/2022   LOWER EXTREMITY ANGIOGRAPHY Left 08/08/2023   Procedure: Lower Extremity Angiography;  Surgeon: Jama Cordella MATSU, MD;  Location: [facility] INVASIVE CV LAB;  Service: Cardiovascular;  Laterality: Left;   LOWER EXTREMITY INTERVENTION Left 08/08/2023   Procedure: LOWER EXTREMITY INTERVENTION;  Surgeon: Jama Cordella MATSU, MD;  Location: [facility] INVASIVE CV  LAB;  Service: Cardiovascular;  Laterality: Left;    Social History Social History   Tobacco Use   Smoking status: Never   Smokeless tobacco: Never  Vaping Use   Vaping status: Never Used  Substance Use Topics   Alcohol use: Yes    Alcohol/week: 12.0 standard drinks of alcohol    Types: 12 Cans of beer per week   Drug use: Yes    Types: Marijuana    Comment: Patient states, I had some this morning.    Family History History reviewed. No pertinent family history.  Allergies  Allergen Reactions   Bee Venom Anaphylaxis     REVIEW OF SYSTEMS (Negative unless checked)  Constitutional: [] Weight loss  [] Fever  [] Chills Cardiac: [] Chest pain   [] Chest pressure   [] Palpitations   [] Shortness of breath when laying flat   [] Shortness of breath with exertion. Vascular:  [x] Pain in legs with walking   [] Pain in legs at rest  [] History  of DVT   [] Phlebitis   [] Swelling in legs   [] Varicose veins   [] Non-healing ulcers Pulmonary:   [] Uses home oxygen   [] Productive cough   [] Hemoptysis   [] Wheeze  [] COPD   [] Asthma Neurologic:  [] Dizziness   [] Seizures   [] History of stroke   [] History of TIA  [] Aphasia   [] Vissual changes   [] Weakness or numbness in arm   [] Weakness or numbness in leg Musculoskeletal:   [] Joint swelling   [] Joint pain   [] Low back pain Hematologic:  [] Easy bruising  [] Easy bleeding   [] Hypercoagulable state   [] Anemic Gastrointestinal:  [] Diarrhea   [] Vomiting  [] Gastroesophageal reflux/heartburn   [] Difficulty swallowing. Genitourinary:  [] Chronic kidney disease   [] Difficult urination  [] Frequent urination   [] Blood in urine Skin:  [] Rashes   [] Ulcers  Psychological:  [] History of anxiety   []  History of major depression.  Physical Examination  Vitals:   11/08/23 1252  BP: 126/88  Resp: 14  Temp: (!) 97.5 F (36.4 C)  TempSrc: Temporal  SpO2: 96%  Weight: 58.3 kg  Height: 5' (1.524 m)   Body mass index is 25.12 kg/m. Gen: WD/WN, NAD Head: [place]/AT, No temporalis wasting.  Ear/Nose/Throat: Hearing grossly intact, nares w/o erythema or drainage Eyes: PER, EOMI, sclera nonicteric.  Neck: Supple, no masses.  No bruit or JVD.  Pulmonary:  Good air movement, no audible wheezing, no use of accessory muscles.  Cardiac: RRR, normal S1, S2, no Murmurs. Vascular:  mild trophic changes, no open wounds Vessel Right Left  Radial Palpable Palpable  PT Not Palpable Not Palpable  DP Not Palpable Not Palpable  Gastrointestinal: soft, non-distended. No guarding/no peritoneal signs.  Musculoskeletal: M/S 5/5 throughout.  No visible deformity.  Neurologic: CN 2-12 intact. Pain and light touch intact in extremities.  Symmetrical.  Speech is fluent. Motor exam as listed above. Psychiatric: Judgment intact, Mood & affect appropriate for pt's clinical situation. Dermatologic: No rashes or ulcers noted.  No changes  consistent with cellulitis.   CBC Lab Results  Component Value Date   WBC 11.9 (H) 10/21/2022   HGB 15.1 (H) 10/21/2022   HCT 42.0 10/21/2022   MCV 94.6 10/21/2022   PLT 397 10/21/2022    BMET    Component Value Date/Time   NA 124 (L) 10/21/2022 1505   K 3.1 (L) 10/21/2022 1505   CL 88 (L) 10/21/2022 1505   CO2 21 (L) 10/21/2022 1505   GLUCOSE 461 (H) 10/21/2022 1505   BUN 9 08/08/2023 1359   CREATININE  0.63 08/08/2023 1359   CREATININE 0.58 09/20/2022 0930   CALCIUM  9.1 10/21/2022 1505   GFRNONAA >60 08/08/2023 1359   GFRAA >60 12/30/2015 2048   CrCl cannot be calculated (Patient's most recent lab result is older than the maximum 21 days allowed.).  COAG Lab Results  Component Value Date   INR 0.9 09/20/2022    Radiology No results found.   Assessment/Plan 1. Atherosclerosis of native artery of both lower extremities with rest pain (HCC) (Primary) Recommend:   The patient has evidence of severe atherosclerotic changes of both lower extremities with rest pain that is associated with preulcerative changes and impending tissue loss of the both feet with the left foot more severe.  This represents a limb threatening ischemia and places the patient at the risk for bilateral limb loss.     Physical examination as well as noninvasive studies are consistent with this finding.  CTA from outside institution has demonstrated SFA occlusions.   Patient should undergo angiography of the left lower extremity initially but then the right lower extremity will need treatment as well.  Angiography is with the hope for intervention for limb salvage.  The risks and benefits as well as the alternative therapies was discussed in detail with the patient.  All questions were answered.  Patient agrees to proceed with left lower extremity angiography first and then right lower extremity angiography.   The patient will follow up with me in the office after the procedure.    I70.223     critical limb ischemia of the lower extremity I70.229    Atherosclerotic occlusive disease with rest pain   CPT codes: 62773   stent placement femoral-popliteal artery 36247   introduction catheter below diaphragm third order   2. NSTEMI (non-ST elevated myocardial infarction) (HCC) Continue cardiac and antihypertensive medications as already ordered and reviewed, no changes at this time.   Continue statin as ordered and reviewed, no changes at this time   Nitrates PRN for chest pain   3. Essential hypertension Continue antihypertensive medications as already ordered, these medications have been reviewed and there are no changes at this time.   4. Type 2 diabetes mellitus with diabetic peripheral angiopathy without gangrene, without long-term current use of insulin  (HCC) Continue hypoglycemic medications as already ordered, these medications have been reviewed and there are no changes at this time.   Hgb A1C to be monitored as already arranged by primary service   5. Gastroesophageal reflux disease without esophagitis Continue PPI as already ordered, this medication has been reviewed and there are no changes at this time.   Avoidence of caffeine and alcohol   Moderate elevation of the head of the bed      [name], MD  11/08/2023 1:34 PM

## 2023-11-08 NOTE — Op Note (Signed)
 [facility]  Percutaneous Study/Intervention Procedural Note   Date of Surgery: 11/08/2023  Surgeon:  Cordella JUDITHANN Shawl, MD.  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  Introduction catheter into left lower extremity 3rd order catheter placement              2.  Contrast injection left lower extremity for distal runoff             3.  Percutaneous transluminal angioplasty and stent placement left external iliac artery.             4.  Percutaneous mechanical thrombectomy of the left superficial femoral artery with a combination of 10 mg of tPA and the [place] thrombectomy catheter.                5. Percutaneous transluminal angioplasty left superficial femoral artery.               6.  Celt closure right common femoral arteriotomy  Anesthesia: Conscious sedation was administered under my direct supervision by the interventional radiology RN. IV Versed  plus fentanyl  were utilized. Continuous ECG, pulse oximetry and blood pressure was monitored throughout the entire procedure.  Conscious sedation was for a total of 1 hour 35 minutes and 3 seconds.  Sheath: 6 [place] Ansell right common femoral retrograde  Contrast: 65 cc  Fluoroscopy Time: 13.4 minutes  Indications:  Claudia Mills presents with critical ischemia of the left lower extremity.  The patient has known atherosclerotic occlusive disease. Noninvasive studies as well as physical examination demonstrate significant atherosclerotic changes. This places the patient at increased risk for limb loss. Angiography with the hope for intervention for limb salvage is recommended.  The risks and benefits are reviewed all questions answered patient agrees to proceed.  Procedure:  Claudia Mills is a 53 y.o. y.o. female  who was identified and appropriate procedural time out was performed.  The patient was then placed supine on the table and prepped and draped in the usual sterile fashion.    Ultrasound was placed in the sterile sleeve and the right groin was evaluated the right common femoral artery was echolucent and pulsatile indicating patency.  Image was recorded for the permanent record and under real-time visualization a microneedle was inserted into the common femoral artery microwire followed by a micro-sheath.  A J-wire was then advanced through the micro-sheath and a  5 [place] sheath was then inserted over a J-wire. J-wire was then advanced and a 5 French pigtail catheter was positioned at the level of T12. AP projection of the aorta was then obtained. Pigtail catheter was repositioned to above the bifurcation and a RAO view of the pelvis was obtained.  Subsequently a pigtail catheter with the stiff angle Glidewire was used to cross the aortic bifurcation the catheter wire were advanced down into the left distal external iliac artery. Oblique view of the femoral bifurcation was then obtained and subsequently the wire was reintroduced and the pigtail catheter negotiated into the SFA representing third order catheter placement. Distal runoff was then performed.  5000 units of heparin  was then given and allowed to circulate and a 6 [place] Ansell sheath was advanced up and over the bifurcation and positioned in the proximal external iliac artery.  A 7 mm  x 40 mm life stent was then deployed across the in-stent restenosis extending the new stent slightly more proximally to cover the entire lesion.  It was then postdilated with a 7 mm x 40 mm Lutonix drug-eluting balloon inflated to 10 atm for 1 minute.  Follow-up imaging demonstrated less than 10% residual stenosis in the sheath was advanced through the stent and positioned at the level of the circumflex arteries.  KMP  catheter and stiff angle Glidewire were then  negotiated through the SFA occlusion into the distal popliteal.  Distal runoff was then completed by hand injection through the catheter.  The advantage wire was then advanced through the catheter and the catheter removed.  An infusion catheter with a 30 cm infusion length was then advanced over the wire and 10 mg of tPA was reconstituted.  The obturator wire was then placed in the infusion catheter and 10 mg of tPA was laced across the SFA occlusion.  After a 30-minute dwell time the obturator wire was removed and a V18 wire was introduced.  The catheter was then removed and the [place] thrombectomy catheter advanced beginning in the distal common femoral and extending down to the mid popliteal.  A total of 2 passes were made.  Follow-up imaging demonstrated near-total resolution of the thrombus throughout the majority of the stent.  Distally there appeared to be 60 to 70% in-stent restenosis.  Proximally in the nonstented segment of the SMA beginning from the origin and extending into the common femoral as well as to the level of the stent there was a greater than 80% stenosis.    A 4 mm x 300 mm Lutonix drug-eluting balloon was used to angioplasty the superficial femoral and popliteal arteries. Inflation was to 10 atmospheres for 1 minutes. Follow-up imaging demonstrated patency with less than 10% residual stenosis from the stents down through the popliteal with preservation of her two-vessel runoff.  However the lesion in the common femoral which extends into the origin of the superficial femoral was not improved.  I then used a 5 mm Lutonix across the common femoral extending into the SFA.  Follow-up imaging did not demonstrate any significant improvement and lastly I used a 6 mm Lutonix balloon across the common femoral into the proximal SFA but as with the previous inflation this did not improve the situation.  I believe the patient will need a left common femoral endarterectomy and will admit the patient  and placed him on heparin  overnight and plan for surgery tomorrow.  After review of these images the sheath is pulled into the right external iliac oblique of the common femoral is obtained and a Celt device deployed. There no immediate complications.   Findings:  The abdominal aorta is opacified with a bolus injection contrast. Renal arteries are single and patent. The aorta itself has diffuse disease but no hemodynamically significant lesions. The right common and external iliac arteries are widely patent bilaterally.  The left common iliac artery is widely patent.  The left external iliac artery demonstrates greater than 90% in-stent restenosis in the proximal half of the previously placed stent that extends somewhat proximally.  The left common femoral demonstrates diffuse moderate disease on the order of 60 to 65%.  The profunda femoris is patent.  The SFA is a flush occlusion at the origin, the occlusion extends down to the above-knee popliteal just distal to Hunter's canal.  The mid and distal popliteal are diffusely diseased but free of hemodynamically significant stenosis.  There  is two-vessel runoff to the foot via the anterior tibial posterior tibial they do demonstrate diffuse disease as well.      Following angioplasty and stent placement the external iliac artery is widely patent with less than 10% residual stenosis  Following thrombectomy there is now forward flow through the SFA and popliteal.  Following angioplasty the SFA is now patent with in-line flow and looks quite nice.  The patient now has forward flow down to the foot and should have complete relief of her ischemic rest pain.  However, the appearance of the common femoral suggests that this would not be a durable reconstruction and therefore a femoral endarterectomy is indicated.  As noted above we will move forward with this tomorrow.                        Disposition: Patient was taken to the recovery room in stable  condition having tolerated the procedure well.  Claudia Mills, Cordella MATSU 11/08/2023,5:49 PM

## 2023-11-08 NOTE — Interval H&P Note (Signed)
 History and Physical Interval Note:  11/08/2023 1:40 PM  Claudia Mills  has presented today for surgery, with the diagnosis of LLE Angio    ASO w rest pain.  The various methods of treatment have been discussed with the patient and family. After consideration of risks, benefits and other options for treatment, the patient has consented to  Procedure(s): Lower Extremity Angiography (Left) as a surgical intervention.  The patient's history has been reviewed, patient examined, no change in status, stable for surgery.  I have reviewed the patient's chart and labs.  Questions were answered to the patient's satisfaction.     [name]

## 2023-11-08 NOTE — Consult Note (Signed)
 PHARMACY - ANTICOAGULATION CONSULT NOTE  Pharmacy Consult for Heparin  Indication: Limb ischemia  Allergies  Allergen Reactions   Bee Venom Anaphylaxis    Patient Measurements: Height: 5' (152.4 cm) Weight: 58.3 kg (128 lb 9.6 oz) IBW/kg (Calculated) : 45.5 HEPARIN  DW (KG): 57.3  Vital Signs: Temp: 96.4 F (35.8 C) (09/24 1530) Temp Source: Temporal (09/24 1530) BP: 139/76 (09/24 1620) Pulse Rate: 99 (09/24 1620)  Labs: No results for input(s): HGB, HCT, PLT, APTT, LABPROT, INR, HEPARINUNFRC, HEPRLOWMOCWT, CREATININE, CKTOTAL, CKMB, TROPONINIHS in the last 72 hours.  CrCl cannot be calculated (Patient's most recent lab result is older than the maximum 21 days allowed.).   Medical History: Past Medical History:  Diagnosis Date   Coronary artery disease    Diabetes mellitus without complication (HCC)    Hypertension    MI (myocardial infarction) (HCC)    PVD (peripheral vascular disease)    Tick bite    Pt states she does not remember daye of tic bite, but that is was since last visit with pain clinic and she was hospitalized for 1 day.    Medications:  No history of chronic anticoagulation use PTA.  Assessment: Patient presents to [facility] for treatment of her left lower extremity ischemia. On 11/06/23, she presented to [facility] and was evaluated in the emergency room and noted to have changes of the left forefoot consistent with ischemia. Pharmacy has been consulted to initiate and monitor continuous heparin  infusion. Baseline labs are ordered and pending.  Goal of Therapy:  Heparin  level 0.3-0.7 units/ml Monitor platelets by anticoagulation protocol: Yes   Plan:  Will avoid bolus initially, but okay to use nomogram thereafter Start heparin  infusion at 950 units/hr Check anti-Xa level in 6 hours and daily while on heparin  Continue to monitor H&H and platelets  Rhiann Boucher A Shawana Knoch 11/08/2023,4:29 PM

## 2023-11-08 NOTE — Plan of Care (Signed)

## 2023-11-09 ENCOUNTER — Encounter
Admission: AD | Disposition: A | Discharge status: Skilled Nursing Facility | Payer: Self-pay | Source: Home / Self Care | Attending: Vascular Surgery

## 2023-11-09 ENCOUNTER — Encounter: Payer: Self-pay | Admitting: Vascular Surgery

## 2023-11-09 ENCOUNTER — Inpatient Hospital Stay

## 2023-11-09 ENCOUNTER — Ambulatory Visit (INDEPENDENT_AMBULATORY_CARE_PROVIDER_SITE_OTHER): Admitting: Vascular Surgery

## 2023-11-09 ENCOUNTER — Other Ambulatory Visit: Payer: Self-pay

## 2023-11-09 DIAGNOSIS — T82868A Thrombosis of vascular prosthetic devices, implants and grafts, initial encounter: Secondary | ICD-10-CM | POA: Diagnosis not present

## 2023-11-09 DIAGNOSIS — I70202 Unspecified atherosclerosis of native arteries of extremities, left leg: Secondary | ICD-10-CM | POA: Diagnosis not present

## 2023-11-09 DIAGNOSIS — I743 Embolism and thrombosis of arteries of the lower extremities: Secondary | ICD-10-CM | POA: Diagnosis not present

## 2023-11-09 DIAGNOSIS — Z9889 Other specified postprocedural states: Secondary | ICD-10-CM | POA: Diagnosis not present

## 2023-11-09 DIAGNOSIS — I70222 Atherosclerosis of native arteries of extremities with rest pain, left leg: Secondary | ICD-10-CM | POA: Diagnosis not present

## 2023-11-09 HISTORY — PX: THROMBECTOMY FEMORAL ARTERY: SHX6406

## 2023-11-09 HISTORY — PX: ENDARTERECTOMY FEMORAL: SHX5804

## 2023-11-09 HISTORY — PX: APPLICATION OF CELL SAVER: SHX7529

## 2023-11-09 LAB — BASIC METABOLIC PANEL WITH GFR
Anion gap: 9 (ref 5–15)
BUN: 5 mg/dL — ABNORMAL LOW (ref 6–20)
CO2: 27 mmol/L (ref 22–32)
Calcium: 8.4 mg/dL — ABNORMAL LOW (ref 8.9–10.3)
Chloride: 101 mmol/L (ref 98–111)
Creatinine, Ser: 0.5 mg/dL (ref 0.44–1.00)
GFR, Estimated: >60 mL/min (ref >60)
Glucose, Bld: 142 mg/dL — ABNORMAL HIGH (ref 70–99)
Potassium: 3.9 mmol/L (ref 3.5–5.1)
Sodium: 137 mmol/L (ref 135–145)

## 2023-11-09 LAB — GLUCOSE, CAPILLARY
Glucose-Capillary: 146 mg/dL — ABNORMAL HIGH (ref 70–99)
Glucose-Capillary: 118 mg/dL — ABNORMAL HIGH (ref 70–99)
Glucose-Capillary: 289 mg/dL — ABNORMAL HIGH (ref 70–99)
Glucose-Capillary: 233 mg/dL — ABNORMAL HIGH (ref 70–99)
Glucose-Capillary: 172 mg/dL — ABNORMAL HIGH (ref 70–99)

## 2023-11-09 LAB — HEPARIN LEVEL (UNFRACTIONATED): Heparin Unfractionated: 0.3 [IU]/mL (ref 0.30–0.70)

## 2023-11-09 LAB — CBC
HCT: 41.1 % (ref 36.0–46.0)
Hemoglobin: 13.7 g/dL (ref 12.0–15.0)
MCH: 34.3 pg — ABNORMAL HIGH (ref 26.0–34.0)
MCHC: 33.3 g/dL (ref 30.0–36.0)
MCV: 103 fL — ABNORMAL HIGH (ref 80.0–100.0)
Platelets: 181 K/uL (ref 150–400)
RBC: 3.99 MIL/uL (ref 3.87–5.11)
RDW: 14.4 % (ref 11.5–15.5)
WBC: 8.5 K/uL (ref 4.0–10.5)
nRBC: 0 % (ref 0.0–0.2)

## 2023-11-09 LAB — MRSA NEXT GEN BY PCR, NASAL: MRSA by PCR Next Gen: NOT DETECTED

## 2023-11-09 SURGERY — ENDARTERECTOMY, FEMORAL
Anesthesia: General | Laterality: Left

## 2023-11-09 MED ORDER — VANCOMYCIN HCL 1 G IV SOLR
INTRAVENOUS | Status: DC | PRN
  Administered 2023-11-09: 1000 mg

## 2023-11-09 MED ORDER — OXYCODONE HCL 5 MG PO TABS
5.0000 mg | ORAL_TABLET | ORAL | Status: DC | PRN
  Administered 2023-11-10 – 2023-11-12 (×9): 5 mg via ORAL
  Administered 2023-11-12 – 2023-11-14 (×7): 10 mg via ORAL
  Administered 2023-11-14: 5 mg via ORAL
  Administered 2023-11-14 – 2023-11-15 (×4): 10 mg via ORAL
  Administered 2023-11-15 – 2023-11-16 (×2): 5 mg via ORAL
  Filled 2023-11-09: qty 2
  Filled 2023-11-09 (×3): qty 1
  Filled 2023-11-09 (×3): qty 2
  Filled 2023-11-09: qty 1
  Filled 2023-11-09: qty 2
  Filled 2023-11-09 (×3): qty 1
  Filled 2023-11-09: qty 2
  Filled 2023-11-09 (×2): qty 1
  Filled 2023-11-09 (×2): qty 2
  Filled 2023-11-09 (×2): qty 1
  Filled 2023-11-09 (×2): qty 2
  Filled 2023-11-09: qty 1
  Filled 2023-11-09: qty 2

## 2023-11-09 MED ORDER — OXYCODONE HCL 5 MG PO TABS: 5.0000 mg | ORAL_TABLET | Freq: Once | ORAL | Status: DC | PRN

## 2023-11-09 MED ORDER — KETAMINE HCL 10 MG/ML IJ SOLN
INTRAMUSCULAR | Status: DC | PRN
  Administered 2023-11-09: 10 mg via INTRAVENOUS
  Administered 2023-11-09: 20 mg via INTRAVENOUS

## 2023-11-09 MED ORDER — MIDAZOLAM HCL 2 MG/2ML IJ SOLN
INTRAMUSCULAR | Status: AC
  Filled 2023-11-09: qty 2

## 2023-11-09 MED ORDER — DEXAMETHASONE SODIUM PHOSPHATE 10 MG/ML IJ SOLN
INTRAMUSCULAR | Status: DC | PRN
  Administered 2023-11-09: 10 mg via INTRAVENOUS

## 2023-11-09 MED ORDER — HEMOSTATIC AGENTS (NO CHARGE) OPTIME
TOPICAL | Status: DC | PRN
  Administered 2023-11-09: 1 via TOPICAL

## 2023-11-09 MED ORDER — HEPARIN SODIUM (PORCINE) 5000 UNIT/ML IJ SOLN
INTRAMUSCULAR | Status: AC
  Filled 2023-11-09: qty 1

## 2023-11-09 MED ORDER — PROPOFOL 10 MG/ML IV BOLUS
INTRAVENOUS | Status: DC | PRN
  Administered 2023-11-09: 150 mg via INTRAVENOUS

## 2023-11-09 MED ORDER — VANCOMYCIN HCL 1000 MG IV SOLR
INTRAVENOUS | Status: AC
  Filled 2023-11-09: qty 20

## 2023-11-09 MED ORDER — ONDANSETRON HCL 4 MG/2ML IJ SOLN
INTRAMUSCULAR | Status: DC | PRN
  Administered 2023-11-09: 4 mg via INTRAVENOUS

## 2023-11-09 MED ORDER — ALBUTEROL SULFATE HFA 108 (90 BASE) MCG/ACT IN AERS
INHALATION_SPRAY | RESPIRATORY_TRACT | Status: DC | PRN
  Administered 2023-11-09: 4 via RESPIRATORY_TRACT

## 2023-11-09 MED ORDER — ACETAMINOPHEN 10 MG/ML IV SOLN: 1000.0000 mg | Freq: Once | INTRAVENOUS | Status: DC | PRN

## 2023-11-09 MED ORDER — ACETAMINOPHEN 10 MG/ML IV SOLN
INTRAVENOUS | Status: AC
  Filled 2023-11-09: qty 100

## 2023-11-09 MED ORDER — FENTANYL CITRATE (PF) 100 MCG/2ML IJ SOLN
INTRAMUSCULAR | Status: DC | PRN
  Administered 2023-11-09 (×2): 50 ug via INTRAVENOUS

## 2023-11-09 MED ORDER — HEPARIN SODIUM (PORCINE) 1000 UNIT/ML IJ SOLN
INTRAMUSCULAR | Status: DC | PRN
  Administered 2023-11-09: 6000 [IU] via INTRAVENOUS

## 2023-11-09 MED ORDER — STERILE WATER FOR IRRIGATION IR SOLN
Status: DC | PRN
  Administered 2023-11-09: 1000 mL

## 2023-11-09 MED ORDER — HYDROMORPHONE HCL 1 MG/ML IJ SOLN
INTRAMUSCULAR | Status: AC
  Filled 2023-11-09: qty 1

## 2023-11-09 MED ORDER — SUGAMMADEX SODIUM 200 MG/2ML IV SOLN
INTRAVENOUS | Status: DC | PRN
  Administered 2023-11-09: 100 mg via INTRAVENOUS
  Administered 2023-11-09: 300 mg via INTRAVENOUS

## 2023-11-09 MED ORDER — SODIUM CHLORIDE 0.9 % IV SOLN: INTRAVENOUS | Status: DC

## 2023-11-09 MED ORDER — OXYCODONE HCL 5 MG/5ML PO SOLN: 5.0000 mg | Freq: Once | ORAL | Status: DC | PRN

## 2023-11-09 MED ORDER — CHLORHEXIDINE GLUCONATE 4 % EX SOLN
60.0000 mL | Freq: Once | CUTANEOUS | Status: AC
  Administered 2023-11-09: 4 via TOPICAL

## 2023-11-09 MED ORDER — KETAMINE HCL 50 MG/5ML IJ SOSY
PREFILLED_SYRINGE | INTRAMUSCULAR | Status: AC
  Filled 2023-11-09: qty 5

## 2023-11-09 MED ORDER — HEPARIN 30,000 UNITS/1000 ML (OHS) CELLSAVER SOLUTION
Status: AC | PRN
  Administered 2023-11-09: 1

## 2023-11-09 MED ORDER — MIDAZOLAM HCL 5 MG/5ML IJ SOLN
INTRAMUSCULAR | Status: DC | PRN
  Administered 2023-11-09: 2 mg via INTRAVENOUS

## 2023-11-09 MED ORDER — GLYCOPYRROLATE 0.2 MG/ML IJ SOLN
INTRAMUSCULAR | Status: DC | PRN
  Administered 2023-11-09: .1 mg via INTRAVENOUS

## 2023-11-09 MED ORDER — PROPOFOL 500 MG/50ML IV EMUL
INTRAVENOUS | Status: DC | PRN
  Administered 2023-11-09: 20 ug/kg/min via INTRAVENOUS

## 2023-11-09 MED ORDER — CEFAZOLIN SODIUM-DEXTROSE 2-4 GM/100ML-% IV SOLN
INTRAVENOUS | Status: AC
Start: 2023-11-09 — End: 2023-11-09
  Filled 2023-11-09: qty 100

## 2023-11-09 MED ORDER — PHENYLEPHRINE HCL-NACL 20-0.9 MG/250ML-% IV SOLN
INTRAVENOUS | Status: DC | PRN
  Administered 2023-11-09: 20 ug/min via INTRAVENOUS

## 2023-11-09 MED ORDER — SODIUM CHLORIDE 0.9 % IV SOLN
INTRAVENOUS | Status: DC | PRN
  Administered 2023-11-09: 501 mL via SURGICAL_CAVITY

## 2023-11-09 MED ORDER — CHLORHEXIDINE GLUCONATE 4 % EX SOLN: 60.0000 mL | Freq: Once | CUTANEOUS | Status: DC

## 2023-11-09 MED ORDER — HYDROMORPHONE HCL 1 MG/ML IJ SOLN
INTRAMUSCULAR | Status: DC | PRN
  Administered 2023-11-09: 1 mg via INTRAVENOUS

## 2023-11-09 MED ORDER — DEXMEDETOMIDINE HCL IN NACL 80 MCG/20ML IV SOLN
INTRAVENOUS | Status: DC | PRN
  Administered 2023-11-09 (×3): 4 ug via INTRAVENOUS

## 2023-11-09 MED ORDER — ACETAMINOPHEN 10 MG/ML IV SOLN
INTRAVENOUS | Status: DC | PRN
  Administered 2023-11-09: 1000 mg via INTRAVENOUS

## 2023-11-09 MED ORDER — INSULIN ASPART 100 UNIT/ML IJ SOLN
INTRAMUSCULAR | Status: AC
  Filled 2023-11-09: qty 1

## 2023-11-09 MED ORDER — ROCURONIUM BROMIDE 100 MG/10ML IV SOLN
INTRAVENOUS | Status: DC | PRN
  Administered 2023-11-09: 30 mg via INTRAVENOUS
  Administered 2023-11-09: 70 mg via INTRAVENOUS

## 2023-11-09 MED ORDER — ACETAMINOPHEN 500 MG PO TABS
1000.0000 mg | ORAL_TABLET | Freq: Once | ORAL | Status: DC
  Filled 2023-11-09: qty 2

## 2023-11-09 MED ORDER — CHLORHEXIDINE GLUCONATE CLOTH 2 % EX PADS
6.0000 | MEDICATED_PAD | Freq: Every day | CUTANEOUS | Status: DC
  Administered 2023-11-10 – 2023-11-20 (×9): 6 via TOPICAL

## 2023-11-09 MED ORDER — PHENYLEPHRINE 80 MCG/ML (10ML) SYRINGE FOR IV PUSH (FOR BLOOD PRESSURE SUPPORT)
PREFILLED_SYRINGE | INTRAVENOUS | Status: DC | PRN
  Administered 2023-11-09 (×4): 80 ug via INTRAVENOUS

## 2023-11-09 MED ORDER — GENTAMICIN SULFATE 40 MG/ML IJ SOLN
INTRAMUSCULAR | Status: DC | PRN
  Administered 2023-11-09: 160 mg

## 2023-11-09 MED ORDER — LIDOCAINE HCL (CARDIAC) PF 100 MG/5ML IV SOSY
PREFILLED_SYRINGE | INTRAVENOUS | Status: DC | PRN
  Administered 2023-11-09: 80 mg via INTRAVENOUS

## 2023-11-09 MED ORDER — SEVOFLURANE IN SOLN
RESPIRATORY_TRACT | Status: AC
  Filled 2023-11-09: qty 250

## 2023-11-09 MED ORDER — INSULIN ASPART 100 UNIT/ML IJ SOLN
5.0000 [IU] | Freq: Once | INTRAMUSCULAR | Status: AC
  Administered 2023-11-09: 5 [IU] via SUBCUTANEOUS

## 2023-11-09 MED ORDER — DROPERIDOL 2.5 MG/ML IJ SOLN: 0.6250 mg | Freq: Once | INTRAMUSCULAR | Status: DC | PRN

## 2023-11-09 MED ORDER — FENTANYL CITRATE (PF) 100 MCG/2ML IJ SOLN
INTRAMUSCULAR | Status: AC
  Filled 2023-11-09: qty 2

## 2023-11-09 MED ORDER — GENTAMICIN SULFATE 40 MG/ML IJ SOLN
INTRAMUSCULAR | Status: AC
  Filled 2023-11-09: qty 4

## 2023-11-09 MED ORDER — VANCOMYCIN HCL IN DEXTROSE 1-5 GM/200ML-% IV SOLN
1000.0000 mg | INTRAVENOUS | Status: AC
  Administered 2023-11-09: 1000 mg via INTRAVENOUS
  Filled 2023-11-09 (×2): qty 200

## 2023-11-09 MED ORDER — FENTANYL CITRATE (PF) 100 MCG/2ML IJ SOLN: 25.0000 ug | INTRAMUSCULAR | Status: DC | PRN

## 2023-11-09 MED ORDER — HEPARIN 30,000 UNITS/1000 ML (OHS) CELLSAVER SOLUTION
Status: AC
  Filled 2023-11-09: qty 1000

## 2023-11-09 SURGICAL SUPPLY — 41 items
BAG DECANTER FOR FLEXI CONT (MISCELLANEOUS) ×1 IMPLANT
BLADE SURG SZ11 CARB STEEL (BLADE) ×1 IMPLANT
BRUSH SCRUB EZ 4% CHG (MISCELLANEOUS) ×1 IMPLANT
CATH EMB LF 3FRX80 (CATHETERS) IMPLANT
CHLORAPREP W/TINT 26 (MISCELLANEOUS) ×1 IMPLANT
CLAMP SUTURE YELLOW 5 PAIRS (MISCELLANEOUS) ×1 IMPLANT
CLEANSER WND VASHE 34 (WOUND CARE) IMPLANT
DRAPE INCISE IOBAN 66X45 STRL (DRAPES) ×1 IMPLANT
DRAPE SHEET LG 3/4 BI-LAMINATE (DRAPES) ×1 IMPLANT
DRESSING SURGICEL FIBRLLR 1X2 (HEMOSTASIS) ×1 IMPLANT
DRSG OPSITE POSTOP 4X6 (GAUZE/BANDAGES/DRESSINGS) IMPLANT
ELECTRODE REM PT RTRN 9FT ADLT (ELECTROSURGICAL) ×1 IMPLANT
GLOVE BIO SURGEON STRL SZ7 (GLOVE) ×2 IMPLANT
GLOVE SURG SYN 8.0 PF PI (GLOVE) ×1 IMPLANT
GOWN STRL REUS W/ TWL XL LVL3 (GOWN DISPOSABLE) ×1 IMPLANT
GOWN STRL REUS W/TWL 2XL LVL3 (GOWN DISPOSABLE) ×1 IMPLANT
GRAFT VASC PATCH XENOSURE 1X14 (Vascular Products) IMPLANT
HEMOSTAT HEMOBLAST BELLOWS (HEMOSTASIS) IMPLANT
IV NS 500ML BAXH (IV SOLUTION) ×1 IMPLANT
KIT STIMULAN RAPID CURE 5CC (Orthopedic Implant) IMPLANT
KIT TURNOVER KIT A (KITS) ×1 IMPLANT
LABEL OR SOLS (LABEL) ×1 IMPLANT
LOOP VESSEL MAXI 1X406 RED (MISCELLANEOUS) ×2 IMPLANT
LOOP VESSEL MINI 0.8X406 BLUE (MISCELLANEOUS) ×3 IMPLANT
MANIFOLD NEPTUNE II (INSTRUMENTS) ×1 IMPLANT
PACK BASIN MAJOR ARMC (MISCELLANEOUS) ×1 IMPLANT
PACK UNIVERSAL (MISCELLANEOUS) ×1 IMPLANT
SET WALTER ACTIVATION W/DRAPE (SET/KITS/TRAYS/PACK) ×1 IMPLANT
STAPLER SKIN PROX 35W (STAPLE) ×1 IMPLANT
SUT PROLENE 5 0 RB 1 DA (SUTURE) ×4 IMPLANT
SUT PROLENE 6 0 BV (SUTURE) ×6 IMPLANT
SUT PROLENE 7 0 BV 1 (SUTURE) ×4 IMPLANT
SUT SILK 2-0 18XBRD TIE 12 (SUTURE) ×1 IMPLANT
SUT SILK 3-0 18XBRD TIE 12 (SUTURE) ×1 IMPLANT
SUT VIC AB 2-0 CT1 TAPERPNT 27 (SUTURE) ×2 IMPLANT
SUT VIC AB 3-0 SH 27X BRD (SUTURE) ×1 IMPLANT
SUT VICRYL+ 3-0 36IN CT-1 (SUTURE) ×2 IMPLANT
SYR 5ML LL (SYRINGE) ×1 IMPLANT
TRAP FLUID SMOKE EVACUATOR (MISCELLANEOUS) ×1 IMPLANT
TRAY FOLEY MTR SLVR 16FR STAT (SET/KITS/TRAYS/PACK) ×1 IMPLANT
WATER STERILE IRR 1000ML POUR (IV SOLUTION) IMPLANT

## 2023-11-09 NOTE — Op Note (Signed)
 OPERATIVE NOTE   PROCEDURE: 1.   Left common femoral endarterectomy and  bovine pericardial patch angioplasty 2.   Left SFA and popliteal artery embolectomy with a 3 Fogarty embolectomy balloon   PRE-OPERATIVE DIAGNOSIS: 1.Atherosclerotic occlusive disease left lower extremities with rest pain   POST-OPERATIVE DIAGNOSIS: Same  SURGEON: Selinda Gu, MD  CO-surgeon:  [name], MD  ANESTHESIA:  general  ESTIMATED BLOOD LOSS: 100 cc  FINDING(S): 1.  significant plaque in left common femoral artery and thrombus within the SFA and popliteal arteries with no backbleeding when opening the artery  SPECIMEN(S):  Left common femoral  plaque. Left SFA and popliteal artery thrombus  INDICATIONS:    Patient presents with critical limb ischemia of the left lower extremity.  She has severe rest pain and and preulcerative changes to the left forefoot with clear clinical signs of ischemia.  Left femoral endarterectomy is planned to try to improve perfusion.  The risks and benefits as well as alternative therapies including intervention were reviewed in detail all questions were answered the patient agrees to proceed with surgery.  DESCRIPTION: After obtaining full informed written consent, the patient was brought back to the operating room and placed supine upon the operating table.  The patient received IV antibiotics prior to induction.  After obtaining adequate anesthesia, the patient was prepped and draped in the standard fashion appropriate time out is called.    Vertical incision was created overlying the left femoral arteries. The common femoral artery proximally, and superficial femoral artery, and primary profunda femoris artery branches were encircled with vessel loops and prepared for control. The left femoral arteries were found to have significant plaque and the anterior wall was quite friable and irregular.   6000 units of heparin  was given and allowed circulate for 5 minutes.    Attention is then turned to the left femoral artery.  An arteriotomy is made with 11 blade and extended with Potts scissors in the common femoral artery and carried down onto the first 1-2 cm of the superficial femoral artery. An endarterectomy was then performed. The [facility] was used to create a plane. The proximal endpoint was cut flush with tenotomy scissors at the bottom of the previous iliac stent. This was in the proximal common femoral artery.  Loose plaque was removed from the profunda femoris artery origin.  Good backbleeding was seen from the profunda femoris artery.  The distal endpoint of the superficial femoral endarterectomy was created with gentle traction and the distal endpoint was at the level of the previously placed in the SFA.  This stent was then tacked down with a series of 7-0 Prolene sutures.  There had been intervention on the SFA yesterday, but there is now no backbleeding and what appeared to be fresh thrombus in the proximal SFA.  It was clear that this would have to be addressed.  We then made 3 passes with the 3 Fogarty embolectomy balloon about 40 to 45 cm down the SFA and popliteal artery and what would be the distal popliteal artery or tibioperoneal trunk.  The first pass return significant thrombus and a small amount of backbleeding.  A second pass return scant thrombus and backbleeding was seen.  The third pass with the 3 Fogarty embolectomy balloon returned no thrombus and backbleeding was present.  The bovine pericardial patcth is then selected and prepared for a patch angioplasty.  It is cut and beveled and started at the proximal endpoint with a 6-0 Prolene suture.  Approximately one  half of the suture line is run medially and laterally and the distal end point was cut and bevelled to match the arteriotomy.  A second 6-0 Prolene was started at the distal end point and run to the mid portion to complete the arteriotomy.  The vessel was flushed prior to release of  control and completion of the anastomosis.  At this point, flow was established first to the profunda femoris artery and then to the superficial femoral artery. Easily palpable pulses are noted well beyond the anastomosis and both arteries.  The wound was then copiously irrigated with Vashe irrigation.  Vancomycin  and gentamicin  impregnated beads were then placed into the wound.  Fibrillar and Vistaseal topical hemostatic agents were placed in the femoral incision and hemostasis was complete. The femoral incision was then closed in a layered fashion with 2 layers of 2-0 Vicryl, 2 layers of 3-0 Vicryl, and staples for the skin closure. Sterile dressing was then placed over the incision.  The patient was then awakened from anesthesia and taken to the recovery room in stable condition having tolerated the procedure well.  COMPLICATIONS: None  CONDITION: Stable     Selinda Gu 11/09/2023 3:17 PM   This note was created with Dragon Medical transcription system. Any errors in dictation are purely unintentional.

## 2023-11-09 NOTE — Progress Notes (Signed)
 Informed Dr. Jama pt's LLE dusky and cool.  Unable to palpate or doppler pulse.  Dr. Jama stated dusky at baseline and will assess in about an hour.  Gwendlyn Shank, NP at bedside now assessing pulses

## 2023-11-09 NOTE — Consult Note (Signed)
 PHARMACY - ANTICOAGULATION CONSULT NOTE  Pharmacy Consult for Heparin  Indication: Limb ischemia  Allergies  Allergen Reactions   Bee Venom Anaphylaxis    Patient Measurements: Height: 5' (152.4 cm) Weight: 58.3 kg (128 lb 9.6 oz) IBW/kg (Calculated) : 45.5 HEPARIN  DW (KG): 57.3  Vital Signs: Temp: 97.7 F (36.5 C) (09/24 2110) BP: 149/70 (09/24 2110) Pulse Rate: 96 (09/24 2110)  Labs: Recent Labs    11/08/23 1611 11/08/23 2224 11/09/23 0448  HGB  --   --  13.7  HCT  --   --  41.1  PLT  --   --  181  APTT 111*  --   --   LABPROT 14.2  --   --   INR 1.0  --   --   HEPARINUNFRC  --  0.36 0.30  CREATININE 0.79  --   --     Estimated Creatinine Clearance: 65 mL/min (by C-G formula based on SCr of 0.79 mg/dL).   Medical History: Past Medical History:  Diagnosis Date   Coronary artery disease    Diabetes mellitus without complication (HCC)    Hypertension    MI (myocardial infarction) (HCC)    PVD (peripheral vascular disease)    Tick bite    Pt states she does not remember daye of tic bite, but that is was since last visit with pain clinic and she was hospitalized for 1 day.    Medications:  No history of chronic anticoagulation use PTA.  Assessment: Patient presents to [facility] for treatment of her left lower extremity ischemia. On 11/06/23, she presented to [facility] and was evaluated in the emergency room and noted to have changes of the left forefoot consistent with ischemia. Pharmacy has been consulted to initiate and monitor continuous heparin  infusion. Baseline labs are ordered and pending.  Goal of Therapy:  Heparin  level 0.3-0.7 units/ml Monitor platelets by anticoagulation protocol: Yes   Plan:  9/25:  HL @ 0448 = 0.30, therapeutic - HL is therapeutic but at lowest end of range and trending down so will go ahead and increase drip rate to 1100 units/hr  - recheck HL on 9/26 with AM labs - CBC daily   Edelyn Heidel  D 11/09/2023,6:36 AM

## 2023-11-09 NOTE — Op Note (Signed)
 OPERATIVE NOTE   PROCEDURE: Left common femoral endarterectomy with bovine pericardial patch angioplasty. Thrombectomy left SFA and popliteal arteries.  PRE-OPERATIVE DIAGNOSIS:   Atherosclerotic occlusive disease left lower extremity with lifestyle limiting claudication and severe rest pain symptoms. Critical limb ischemia. Thrombosis SFA popliteal stents  POST-OPERATIVE DIAGNOSIS: Same  CO-SURGEON: Cordella JUDITHANN Shawl, MD and Selinda CANDIE Gu, M.D.  ASSISTANT(S): None  ANESTHESIA: general  ESTIMATED BLOOD LOSS: 300 cc  FINDING(S): Profound calcific plaque noted of the left common femoral. Thrombus from left SFA and popliteal arteries  SPECIMEN(S):  Calcific plaque from the common femoral, superficial femoral and the profunda femoris artery  INDICATIONS:   Claudia Mills 53 y.o. y.o.female who presents with complaints of lifestyle limiting claudication and severe rest pain continuously in the left lower extremity. The patient has documented severe atherosclerotic occlusive disease and has undergone minimally invasive treatments in the past. However, at this point his primary area of stricture stenosis resides in the common femoral and origins of the superficial femoral and profunda femoris extending into these arteries and therefore this is not amenable to intervention. The patient is therefore undergoing open endarterectomy. The risks and benefits of surgery have been reviewed with the patient, all questions have answered; alternative therapies have been reviewed as well and the patient has agreed to proceed with surgical open repair.  DESCRIPTION: After obtaining full informed written consent, the patient was brought back to the operating room and placed supine upon the operating table.  The patient received IV antibiotics prior to induction.  After obtaining adequate anesthesia, the patient was prepped and draped in the standard fashion for  left femoral exposure.  Co-surgeons are required because this is a complicated procedure with work being performed simultaneously by both surgeons.  This expedites the procedure making a shorter operative time reducing complications and improving patient safety.  Attention was turned to the left groin with Dr. Gu working on the patient's right and myself working on the left of the patient.  Vertical  Incision was made over the left common femoral artery and dissection carried down to the common femoral artery with electrocautery.  I dissected out the common femoral artery from the distal external iliac artery (identified by the superficial circumflex vessels) down to the femoral bifurcation.  On initial inspection, the common femoral artery was: densely calcified and there was no palpable pulse noted.    Subsequently the dissection was continued to include all circumflex branches and the profunda femoral artery and superficial femoral artery. The superficial femoral artery was dissected circumferentially for a distance of approximately 3-4 cm and the profunda femoris was dissected circumferentially out to the fourth order branches individual vessel loops were placed around each branch.  Control of all branches was obtained with vessel loops.  A softer area in the distal external iliac artery amendable to clamping was identified.    The patient was given 6000 units of Heparin  intravenously, which was a therapeutic bolus.   After waiting 3 minutes, the distal external iliac artery was clamped and all of the vessel loops were placed under tension.  Arteriotomy was made in the common femoral artery with a 11-blade and extended it with a Potts scissor proximally and distally extending the distal end down the SFA for approximately 3 cm.   Endarterectomy was then performed under direct visualization using a freer elevator and a right angle from the mid common femoral extending up both proximally and distally.  Proximally the endarterectomy was brought up to the  level of the clamp where a clean edge was obtained. Distally the endarterectomy was carried down to expose the proximal end of the pre-existing stents in the SFA where a feathered edge would was obtained.  7-0 Prolene interrupted tacking sutures were placed to secure the leading edge of the stents to the SFA.  The profunda femoris was treated with an eversion technique extending endarterectomy approximately 2 cm distally again obtaining a featheredge on both sides right and left.  There was no backbleeding from the SFA and there appeared to be fresh thrombus within the proximal stents.  A  #3 Fogarty was delivered onto the field and a total of 3 passes were made to a length of 40 cm.  First pass returned some fresh thrombotic material.  The next 2 passes appeared free of any thrombus and backbleeding was now present  At this point, a bovine pericardial patch was fashioned for the geometry of the arteriotomy.  The pericardial patch was sewn to the artery with 2 running stitches of 6-0 Prolene, running from each end.  Prior to completing the patch angioplasty, the profunda femoral artery was flushed as was the superficial femoral artery. The system was then forward flushed. The endarterectomy site was then irrigated copiously with heparinized saline. The patch angioplasty was completed in the usual fashion.  Flow was then reestablished first to the profunda femoris and then the superficial femoral artery. Any gaps or bleeding sites in the suture line were easily controlled with a 6-0 Prolene suture.   The left groin was then irrigated copiously with Vashe and subsequently hemoblast and fibrillar were placed in the wound.  Additionally, antibiotic beads using vancomycin  and gentamicin  were also placed in the bed of the wounds.  The incision was repaired with a double layer of 2-0 Vicryl, a double layer of 3-0 Vicryl, and staples were used to approximate the  skin.  Prevena disposable VAC dressings were then applied to the groin.   COMPLICATIONS: None  CONDITION: Claudia Mills, M.D. [facility] Office: [phone]  11/09/2023, 3:16 PM

## 2023-11-09 NOTE — Anesthesia Preprocedure Evaluation (Addendum)
 Anesthesia Evaluation  Patient identified by MRN, date of birth, ID band Patient awake    Reviewed: Allergy & Precautions, H&P , NPO status , Patient's Chart, lab work & pertinent test results  Airway Mallampati: II  TM Distance: >3 FB Neck ROM: full    Dental no notable dental hx.    Pulmonary shortness of breath and with exertion, Current Smoker and Patient abstained from smoking.   Pulmonary exam normal        Cardiovascular hypertension, + CAD, + Past MI (NSTEMI 01/30/2019), + Cardiac Stents (2.5 x 38 mm Xience DES mid LAD (SCAD), 02/01/2019 at [facility]) and + Peripheral Vascular Disease  Normal cardiovascular exam  2D echocardiogram 01/30/2019 revealed normal left ventricular function, with LVEF greater than 55%.  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  The patient underwent PCI with 2.5 x 38 mm Xience DES mid LAD with PTCA of diagonal branch   Neuro/Psych  Neuromuscular disease  negative psych ROS   GI/Hepatic negative GI ROS,,,(+)     substance abuse  alcohol use and marijuana use  Endo/Other  diabetes, Type 2    Renal/GU      Musculoskeletal  (+) Arthritis ,    Abdominal   Peds  Hematology negative hematology ROS (+)   Anesthesia Other Findings Past Medical History: No date: Coronary artery disease No date: Diabetes mellitus without complication (HCC) No date: Hypertension No date: MI (myocardial infarction) (HCC) No date: PVD (peripheral vascular disease) No date: Tick bite     Comment:  Pt states she does not remember daye of tic bite, but               that is was since last visit with pain clinic and she was              hospitalized for 1 day.  Past Surgical History: No date: CORONARY ANGIOPLASTY WITH STENT PLACEMENT 10/06/2022: IR KYPHO LUMBAR INC FX REDUCE BONE BX UNI/BIL  CANNULATION  INC/IMAGING 09/20/2022: IR RADIOLOGIST EVAL & MGMT 10/13/2022: IR RADIOLOGIST EVAL & MGMT 10/20/2022: IR RADIOLOGIST EVAL & MGMT 08/08/2023: LOWER EXTREMITY ANGIOGRAPHY; Left     Comment:  Procedure: Lower Extremity Angiography;  Surgeon:               Jama Cordella MATSU, MD;  Location: [facility] INVASIVE CV LAB;               Service: Cardiovascular;  Laterality: Left; 11/08/2023: LOWER EXTREMITY ANGIOGRAPHY; Left     Comment:  Procedure: Lower Extremity Angiography;  Surgeon:               Jama Cordella MATSU, MD;  Location: [facility] INVASIVE CV LAB;               Service: Cardiovascular;  Laterality: Left; 08/08/2023: LOWER EXTREMITY INTERVENTION; Left     Comment:  Procedure: LOWER EXTREMITY INTERVENTION;  Surgeon:               Jama Cordella MATSU, MD;  Location: [facility] INVASIVE CV LAB;               Service: Cardiovascular;  Laterality: Left;  BMI    Body Mass Index: 25.12 kg/m      Reproductive/Obstetrics negative OB ROS  Anesthesia Physical Anesthesia Plan  ASA: 3  Anesthesia Plan: General ETT   Post-op Pain Management: Ofirmev  IV (intra-op)* and Toradol  IV (intra-op)*   Induction: Intravenous  PONV Risk Score and Plan: 2 and Ondansetron , Dexamethasone  and Midazolam   Airway Management Planned: Oral ETT  Additional Equipment:   Intra-op Plan:   Post-operative Plan: Extubation in OR  Informed Consent: I have reviewed the patients History and Physical, chart, labs and discussed the procedure including the risks, benefits and alternatives for the proposed anesthesia with the patient or authorized representative who has indicated his/her understanding and acceptance.     Dental Advisory Given  Plan Discussed with: CRNA and Surgeon  Anesthesia Plan Comments:          Anesthesia Quick Evaluation

## 2023-11-09 NOTE — Transfer of Care (Signed)
 Immediate Anesthesia Transfer of Care Note  Patient: Claudia Mills  Procedure(s) Performed: ENDARTERECTOMY, FEMORAL (Left) APPLICATION OF CELL SAVER (Left) THROMBECTOMY, ARTERY, FEMORAL and Popliteal (Left)  Patient Location: PACU  Anesthesia Type:General  Level of Consciousness: drowsy  Airway & Oxygen Therapy: Patient Spontanous Breathing and Patient connected to face mask oxygen  Post-op Assessment: Report given to RN and Post -op Vital signs reviewed and stable  Post vital signs: Reviewed and stable  Last Vitals:  Vitals Value Taken Time  BP 128/54 11/09/23 15:22  Temp    Pulse 111 11/09/23 15:26  Resp 17 11/09/23 15:26  SpO2 99 % 11/09/23 15:26  Vitals shown include unfiled device data.  Last Pain:  Vitals:   11/09/23 1142  TempSrc: Temporal  PainSc: 0-No pain         Complications: No notable events documented.

## 2023-11-09 NOTE — Anesthesia Postprocedure Evaluation (Signed)
 Anesthesia Post Note  Patient: Claudia Mills  Procedure(s) Performed: ENDARTERECTOMY, FEMORAL (Left) APPLICATION OF CELL SAVER (Left) THROMBECTOMY, ARTERY, FEMORAL and Popliteal (Left)  Patient location during evaluation: PACU Anesthesia Type: General Level of consciousness: awake and alert Pain management: pain level controlled Vital Signs Assessment: post-procedure vital signs reviewed and stable Respiratory status: spontaneous breathing, nonlabored ventilation, respiratory function stable and patient connected to nasal cannula oxygen Cardiovascular status: blood pressure returned to baseline and stable Postop Assessment: no apparent nausea or vomiting Anesthetic complications: no   No notable events documented.   Last Vitals:  Vitals:   11/09/23 1600 11/09/23 1607  BP: 124/60   Pulse: (!) 101 98  Resp: 16 14  Temp:    SpO2: 97% 96%    Last Pain:  Vitals:   11/09/23 1528  TempSrc:   PainSc: Asleep                 Lendia LITTIE Mae

## 2023-11-09 NOTE — Progress Notes (Signed)
 Gave verbal report to [place], [place] in ICU.

## 2023-11-09 NOTE — Anesthesia Procedure Notes (Signed)
 Procedure Name: Intubation Date/Time: 11/09/2023 12:47 PM  Performed by: Darci Lykins, CRNAPre-anesthesia Checklist: Patient identified, Emergency Drugs available, Suction available and Patient being monitored Patient Re-evaluated:Patient Re-evaluated prior to induction Oxygen Delivery Method: Circle System Utilized Preoxygenation: Pre-oxygenation with 100% oxygen Induction Type: IV induction Ventilation: Mask ventilation without difficulty Laryngoscope Size: Mac and 3 Grade View: Grade I Tube type: Oral Tube size: 7.0 mm Number of attempts: 1 Airway Equipment and Method: Stylet and Oral airway Placement Confirmation: ETT inserted through vocal cords under direct vision, positive ETCO2 and breath sounds checked- equal and bilateral Secured at: 22 cm Tube secured with: Tape Dental Injury: Teeth and Oropharynx as per pre-operative assessment  Comments: Easy, atraumatic intubation. Head and neck midline. Lips and tongue unchanged.

## 2023-11-10 ENCOUNTER — Encounter: Payer: Self-pay | Admitting: Vascular Surgery

## 2023-11-10 ENCOUNTER — Other Ambulatory Visit: Payer: Self-pay

## 2023-11-10 DIAGNOSIS — Z9889 Other specified postprocedural states: Secondary | ICD-10-CM

## 2023-11-10 DIAGNOSIS — I70222 Atherosclerosis of native arteries of extremities with rest pain, left leg: Secondary | ICD-10-CM

## 2023-11-10 DIAGNOSIS — I70229 Atherosclerosis of native arteries of extremities with rest pain, unspecified extremity: Secondary | ICD-10-CM

## 2023-11-10 LAB — TYPE AND SCREEN
ABO/RH(D): O POS
Antibody Screen: NEGATIVE
Unit division: 0
Unit division: 0

## 2023-11-10 LAB — BPAM RBC
Blood Product Expiration Date: 202510252359
Blood Product Expiration Date: 202510252359
Unit Type and Rh: 5100
Unit Type and Rh: 5100

## 2023-11-10 LAB — CBC
HCT: 39 % (ref 36.0–46.0)
Hemoglobin: 12.9 g/dL (ref 12.0–15.0)
MCH: 34.2 pg — ABNORMAL HIGH (ref 26.0–34.0)
MCHC: 33.1 g/dL (ref 30.0–36.0)
MCV: 103.4 fL — ABNORMAL HIGH (ref 80.0–100.0)
Platelets: 164 K/uL (ref 150–400)
RBC: 3.77 MIL/uL — ABNORMAL LOW (ref 3.87–5.11)
RDW: 14.4 % (ref 11.5–15.5)
WBC: 9.4 K/uL (ref 4.0–10.5)
nRBC: 0 % (ref 0.0–0.2)

## 2023-11-10 LAB — GLUCOSE, CAPILLARY
Glucose-Capillary: 147 mg/dL — ABNORMAL HIGH (ref 70–99)
Glucose-Capillary: 132 mg/dL — ABNORMAL HIGH (ref 70–99)
Glucose-Capillary: 144 mg/dL — ABNORMAL HIGH (ref 70–99)
Glucose-Capillary: 140 mg/dL — ABNORMAL HIGH (ref 70–99)

## 2023-11-10 LAB — PREPARE RBC (CROSSMATCH)

## 2023-11-10 NOTE — Progress Notes (Signed)
 Progress Note    11/10/2023 10:00 AM 1 Day Post-Op  Subjective:  Claudia Mills is a 53 yo female who is POD #1 from:   PROCEDURE: 1.   Left common femoral endarterectomy and  bovine pericardial patch angioplasty 2.   Left SFA and popliteal artery embolectomy with a 3 Fogarty embolectomy balloon  AND  POD #2 FROM:  Procedure(s) Performed:             1.  Introduction catheter into left lower extremity 3rd order catheter placement              2.  Contrast injection left lower extremity for distal runoff             3.  Percutaneous transluminal angioplasty and stent placement left external iliac artery.             4.  Percutaneous mechanical thrombectomy of the left superficial femoral artery with a combination of 10 mg of tPA and the [place] thrombectomy catheter.                5. Percutaneous transluminal angioplasty left superficial femoral artery.               6.  Celt closure right common femoral arteriotomy  Patient is resting comfortably in ICU this morning.  Patient endorses her left leg feels better this morning.  She also endorses it feels warmer today.  No complaints overnight.  Vitals all remained stable.   Vitals:   11/10/23 0600 11/10/23 0700  BP: (!) 150/79 (!) 156/76  Pulse: 89 94  Resp: 18 20  Temp:    SpO2: 98% 97%   Physical Exam: Cardiac:  RRR, normal S1 and S2.  No murmurs. Lungs: Clear throughout on auscultation but diminished in the bases.  No rales rhonchi or wheezing noted. Incisions: Left groin incision with dressing clean dry and intact.  No hematoma seroma no. Extremities: Bilateral lower extremities warm to touch with good Doppler pulses.  Patient does not have a Doppler pulse in her left dorsalis pedis. Abdomen: Positive bowel sounds throughout, soft, nontender nondistended. Neurologic: Alert and oriented x 3, answers questions and follows commands  CBC    Component Value Date/Time   WBC 9.4 11/10/2023 0413   RBC 3.77 (L) 11/10/2023  0413   HGB 12.9 11/10/2023 0413   HCT 39.0 11/10/2023 0413   PLT 164 11/10/2023 0413   MCV 103.4 (H) 11/10/2023 0413   MCH 34.2 (H) 11/10/2023 0413   MCHC 33.1 11/10/2023 0413   RDW 14.4 11/10/2023 0413   LYMPHSABS 2.2 09/20/2021 1849   MONOABS 0.4 09/20/2021 1849   EOSABS 0.1 09/20/2021 1849   BASOSABS 0.1 09/20/2021 1849    BMET    Component Value Date/Time   NA 137 11/09/2023 0448   K 3.9 11/09/2023 0448   CL 101 11/09/2023 0448   CO2 27 11/09/2023 0448   GLUCOSE 142 (H) 11/09/2023 0448   BUN 5 (L) 11/09/2023 0448   CREATININE 0.50 11/09/2023 0448   CREATININE 0.58 09/20/2022 0930   CALCIUM  8.4 (L) 11/09/2023 0448   GFRNONAA >60 11/09/2023 0448   GFRAA >60 12/30/2015 2048    INR    Component Value Date/Time   INR 1.0 11/08/2023 1611     Intake/Output Summary (Last 24 hours) at 11/10/2023 1000 Last data filed at 11/10/2023 0940 Gross per 24 hour  Intake 998.14 ml  Output 850 ml  Net 148.14 ml     Assessment/Plan:  53 y.o. female is s/p SEE ABOVE 1 Day Post-Op   PLAN Pain medication as needed. Advance diet as tolerated. PT/OT Discontinue Foley catheter this morning. Transfer to the floor.  DVT prophylaxis: ASA 81 mg daily, Plavix 75 mg daily.   [name] [facility] 11/10/2023 10:00 AM

## 2023-11-10 NOTE — TOC Initial Note (Signed)
 Transition of Care [facility]) - Initial/Assessment Note    Patient Details  Name: Claudia Mills MRN: [id number] Date of Birth: April 04, 1970  Transition of Care [facility]) CM/SW Contact:    Corrie JINNY Ruts, LCSW Phone Number: 11/10/2023, 11:39 AM  Clinical Narrative:                 Chart reviewed. The patient was admitted for critical limb ischemia of both lower extremities. I spoke with the patient at bedside today. I introduced myself, my role, and reason for consult. The patient reports that she has been doing well today. The patient reports that she has a PCP. The patient reports that she lives in the home with her mother.   The patient reports that she sometimes need assistance because she broke her back over a year ago. The patient reports that her some girlfriend will help transport her to medical appointments. The patient reports that her sons girlfriend, cindy, sister-melissa, niece-vanessa will assist her during discharge.   The patient reports that [facility] and copays are affordable. The patient reports the she has never has HH or been admitted into a SNF in the past.   The patient reports that she has a Museum/gallery exhibitions officer, shower chair, and raised toilet seat in the home.   There was a consult put in for medication assistance. The patient was interested in getting help with medication cost. I will reach out to meds to bed to see if they are able to assist the patient.   There are no other TOC needs at this time.     Barriers to Discharge: Continued Medical Work up   Patient Goals and CMS Choice            Expected Discharge Plan and Services                                              Prior Living Arrangements/Services   Lives with:: Parents Patient language and need for interpreter reviewed:: Yes        Need for Family Participation in Patient Care: Yes (Comment)     Criminal Activity/Legal Involvement Pertinent to Current  Situation/Hospitalization: No - Comment as needed  Activities of Daily Living   ADL Screening (condition at time of admission) Independently performs ADLs?: Yes (appropriate for developmental age) Is the patient deaf or have difficulty hearing?: No Does the patient have difficulty seeing, even when wearing glasses/contacts?: No Does the patient have difficulty concentrating, remembering, or making decisions?: No  Permission Sought/Granted                  Emotional Assessment Appearance:: Appears stated age Attitude/Demeanor/Rapport: Gracious Affect (typically observed): Calm, Pleasant Orientation: : Oriented to Self, Oriented to Place, Oriented to  Time, Oriented to Situation Alcohol / Substance Use: Not Applicable Psych Involvement: No (comment)  Admission diagnosis:  Atherosclerosis of artery of extremity with rest pain (HCC) [I70.229] Critical limb ischemia of both lower extremities with rest pain [facility]) [I70.223] Patient Active Problem List   Diagnosis Date Noted   Atherosclerosis of artery of extremity with rest pain (HCC) 11/10/2023   Critical limb ischemia of both lower extremities with rest pain (HCC) 11/08/2023   Diabetes (HCC) 08/03/2023   GERD (gastroesophageal reflux disease) 08/03/2023   Peripheral artery disease 07/28/2023   Tobacco use disorder 06/29/2023  Cellulitis of right lower extremity 06/24/2023   Right ankle swelling 06/24/2023   Tick bite of right ankle 06/24/2023   Chronic pain syndrome 05/31/2023   Derangement of left shoulder joint 02/24/2023   Pain in left shoulder 02/14/2023   Lumbar facet arthropathy 12/27/2022   Compression fracture of L1 lumbar vertebra (HCC) 12/27/2022   Chronic bilateral low back pain without sciatica 12/27/2022   Hyperglycemia 10/19/2022   Dehydration 10/19/2022   Nausea and vomiting 10/18/2022   DKA, type 2, not at goal [facility]) 10/18/2022   Collapsed vertebra, not elsewhere classified, lumbar region, subsequent  encounter for fracture with routine healing 07/07/2022   Diabetes mellitus type 2, diet-controlled (HCC) 03/16/2022   Pure hypercholesterolemia 03/16/2022   Knee pain, right 02/09/2022   Skin lesion of scalp 02/09/2022   Constipation, unspecified 03/06/2019   Insomnia, unspecified 03/06/2019   Subcutaneous mass 03/06/2019   Unspecified abnormal findings in urine 03/06/2019   Low back pain, unspecified 03/06/2019   Coronary artery disease 02/06/2019   Essential hypertension 01/31/2019   NSTEMI (non-ST elevated myocardial infarction) (HCC) 01/31/2019   Uncontrolled type 2 diabetes mellitus with hyperglycemia (HCC) 01/31/2019   Unstable angina pectoris (HCC) 01/31/2019   Well adult exam 10/04/2016   Inflamed seborrheic keratosis 12/27/2013   Other hyperlipidemia 11/13/2013   Symptomatic menopausal or female climacteric states 10/05/2013   Allergic rhinitis 07/04/2011   Encounter for other screening for malignant neoplasm of breast 07/04/2011   Benign neoplasm of scalp and skin of neck 08/28/2009   Cannabis abuse 06/16/2009   Nondependent alcohol abuse, episodic drinking behavior 06/16/2009   Type 2 diabetes mellitus with peripheral neuropathy (HCC) 06/16/2009   PCP:  Lorel Maxie LABOR, MD Pharmacy:   [facility] [address] ([address]), [place] - [address] [place]) [place] [address] Phone: [phone] Fax: [phone]  [facility] - [place], [place] - [address] [place] [address] Phone: [phone] Fax: [phone]     Social Drivers of Health (SDOH) Social History: SDOH Screenings   Food Insecurity: No Food Insecurity (10/19/2022)  Housing: Low Risk  (10/19/2022)  Transportation Needs: No Transportation Needs (10/19/2022)  Utilities: Not At Risk (10/19/2022)  Depression (PHQ2-9): Low Risk  (09/12/2023)  Tobacco Use: Low Risk  (11/09/2023)   SDOH Interventions:     Readmission Risk  Interventions     No data to display

## 2023-11-10 NOTE — Plan of Care (Signed)
   Problem: Education: Goal: Knowledge of General Education information will improve Description: Including pain rating scale, medication(s)/side effects and non-pharmacologic comfort measures Outcome: Progressing   Problem: Health Behavior/Discharge Planning: Goal: Ability to manage health-related needs will improve Outcome: Progressing   Problem: Clinical Measurements: Goal: Ability to maintain clinical measurements within normal limits will improve Outcome: Progressing Goal: Will remain free from infection Outcome: Progressing Goal: Diagnostic test results will improve Outcome: Progressing   Problem: Nutrition: Goal: Adequate nutrition will be maintained Outcome: Progressing   Problem: Pain Managment: Goal: General experience of comfort will improve and/or be controlled Outcome: Progressing

## 2023-11-10 NOTE — Evaluation (Signed)
 Physical Therapy Evaluation Patient Details Name: Claudia Mills MRN: [id number] DOB: 1970-08-02 Today's Date: 11/10/2023  History of Present Illness  Pt is a 53 y/o F with c/o pain and discoloration of R toes. Past hx of L SFA and external iliac stent on 08/08/23. Pt underwent L common femoral endarterectomy and L SFA thrombectomy on 11/09/23. PMH significant for HTN, DM, PAD.   Clinical Impression  Pt A&Ox4, agreeable to PT evaluation, cited L hip operative site pain of 6/10 intensity at start of session- RN informed. At baseline, pt is modI with ADLs, rollator for ambulation, denies falls. Pt was met supine in bed, supervision for bed mobility and STS from EOB with RW. Pt amb ~146ft with RW, no LOB, VC for upright posture. HR and SpO2 monitored during mobility, maintained mid range of 110-118 bpm during amb with SpO2 >98% throughout on RA. Pt was left sitting in recliner at end of session, all needs in reach. Pt would benefit from skilled PT intervention to address listed deficits (see PT Problem List) and allow for safe return to PLOF.       If plan is discharge home, recommend the following: A little help with walking and/or transfers;A little help with bathing/dressing/bathroom;Assistance with cooking/housework;Assist for transportation;Help with stairs or ramp for entrance   Can travel by private vehicle        Equipment Recommendations Rolling walker (2 wheels)  Recommendations for Other Services       Functional Status Assessment Patient has had a recent decline in their functional status and demonstrates the ability to make significant improvements in function in a reasonable and predictable amount of time.     Precautions / Restrictions Precautions Precautions: Fall Recall of Precautions/Restrictions: Intact Restrictions Weight Bearing Restrictions Per Provider Order: No      Mobility  Bed Mobility Overal bed mobility: Needs Assistance Bed Mobility: Supine to Sit      Supine to sit: Supervision, HOB elevated     General bed mobility comments: no physical assistance required    Transfers Overall transfer level: Needs assistance Equipment used: Rolling walker (2 wheels) Transfers: Sit to/from Stand Sit to Stand: Supervision           General transfer comment: STS from EOB, no physical assistance required    Ambulation/Gait Ambulation/Gait assistance: Contact guard assist Gait Distance (Feet): 160 Feet Assistive device: Rolling walker (2 wheels) Gait Pattern/deviations: Step-through pattern, Narrow base of support       General Gait Details: no LOB, steady cadence, mild decreased stance time on LLE  Stairs            Wheelchair Mobility     Tilt Bed    Modified Rankin (Stroke Patients Only)       Balance Overall balance assessment: Needs assistance Sitting-balance support: Feet supported Sitting balance-Leahy Scale: Good Sitting balance - Comments: steady static and dynamic sitting   Standing balance support: Bilateral upper extremity supported Standing balance-Leahy Scale: Good                               Pertinent Vitals/Pain Pain Assessment Pain Assessment: 0-10 Pain Score: 6  Pain Location: L hip Pain Descriptors / Indicators: Grimacing, Sore Pain Intervention(s): Limited activity within patient's tolerance, Monitored during session, Repositioned    Home Living Family/patient expects to be discharged to:: Private residence Living Arrangements: Parent (mother) Available Help at Discharge: Family;Available PRN/intermittently Type of Home: Mobile home Home Access: Stairs  to enter Entrance Stairs-Rails: Right;Left;Can reach both Entrance Stairs-Number of Steps: 5   Home Layout: One level Home Equipment: Rollator (4 wheels);Shower seat;Toilet riser      Prior Function Prior Level of Function : Independent/Modified Independent;Driving             Mobility Comments: pt reports using  rollator for mobility, denies falls ADLs Comments: Pt states that she is mostly IND with ADLs, drives regularly     Extremity/Trunk Assessment   Upper Extremity Assessment Upper Extremity Assessment: Overall WFL for tasks assessed    Lower Extremity Assessment Lower Extremity Assessment: Generalized weakness       Communication   Communication Communication: No apparent difficulties    Cognition Arousal: Alert Behavior During Therapy: WFL for tasks assessed/performed   PT - Cognitive impairments: No apparent impairments                       PT - Cognition Comments: A&Ox4 Following commands: Intact       Cueing Cueing Techniques: Verbal cues     General Comments General comments (skin integrity, edema, etc.): honeycomb over L anterior hip intact pre-post mobility, no drainage    Exercises Other Exercises Other Exercises: HR and SpO2 monitored during session: HR in high 80s to low 90s at rest, low 100s after transfer to sitting EOB, maintained 110-118 during ambulation, low 90s at end of session. SpO2 remained >98% throughout session with pt on RA.   Assessment/Plan    PT Assessment Patient needs continued PT services  PT Problem List Decreased strength;Decreased activity tolerance;Decreased balance;Decreased mobility;Decreased knowledge of use of DME;Pain       PT Treatment Interventions DME instruction;Gait training;Stair training;Functional mobility training;Therapeutic activities;Therapeutic exercise;Balance training;Neuromuscular re-education;Patient/family education    PT Goals (Current goals can be found in the Care Plan section)  Acute Rehab PT Goals Patient Stated Goal: to go home PT Goal Formulation: With patient Time For Goal Achievement: 11/24/23 Potential to Achieve Goals: Good    Frequency Min 2X/week     Co-evaluation               AM-PAC PT 6 Clicks Mobility  Outcome Measure Help needed turning from your back to your side  while in a flat bed without using bedrails?: None Help needed moving from lying on your back to sitting on the side of a flat bed without using bedrails?: A Little Help needed moving to and from a bed to a chair (including a wheelchair)?: A Little Help needed standing up from a chair using your arms (e.g., wheelchair or bedside chair)?: A Little Help needed to walk in hospital room?: A Little Help needed climbing 3-5 steps with a railing? : A Lot 6 Click Score: 18    End of Session Equipment Utilized During Treatment: Gait belt Activity Tolerance: Patient tolerated treatment well Patient left: in chair;with call bell/phone within reach Nurse Communication: Mobility status PT Visit Diagnosis: Other abnormalities of gait and mobility (R26.89);Muscle weakness (generalized) (M62.81);Difficulty in walking, not elsewhere classified (R26.2);Pain Pain - Right/Left: Left Pain - part of body: Hip    Time: 9091-9068 PT Time Calculation (min) (ACUTE ONLY): 23 min   Charges:   PT Evaluation $PT Eval Low Complexity: 1 Low PT Treatments $Therapeutic Activity: 8-22 mins PT General Charges $$ ACUTE PT VISIT: 1 Visit         Janell Axe, SPT

## 2023-11-11 LAB — GLUCOSE, CAPILLARY
Glucose-Capillary: 117 mg/dL — ABNORMAL HIGH (ref 70–99)
Glucose-Capillary: 138 mg/dL — ABNORMAL HIGH (ref 70–99)
Glucose-Capillary: 129 mg/dL — ABNORMAL HIGH (ref 70–99)
Glucose-Capillary: 159 mg/dL — ABNORMAL HIGH (ref 70–99)

## 2023-11-11 MED ORDER — MAGNESIUM HYDROXIDE 400 MG/5ML PO SUSP
5.0000 mL | Freq: Every day | ORAL | Status: DC | PRN
  Administered 2023-11-11 – 2023-11-19 (×3): 5 mL via ORAL
  Filled 2023-11-11 (×4): qty 30

## 2023-11-11 MED ORDER — DOCUSATE SODIUM 100 MG PO CAPS
100.0000 mg | ORAL_CAPSULE | Freq: Two times a day (BID) | ORAL | Status: DC
  Administered 2023-11-11 – 2023-11-23 (×23): 100 mg via ORAL
  Filled 2023-11-11 (×23): qty 1

## 2023-11-11 NOTE — Progress Notes (Signed)
 2 Days Post-Op   Subjective/Chief Complaint: States her LEFT foot/leg feel better. OOB with PT yesterday- able to ambulate. Otherwise without complaint.   Objective: Vital signs in last 24 hours: Temp:  [98.3 F (36.8 C)-99.2 F (37.3 C)] 98.3 F (36.8 C) (09/27 0441) Pulse Rate:  [91-99] 97 (09/27 0441) Resp:  [10-20] 18 (09/27 0441) BP: (143-183)/(67-88) 143/77 (09/27 0441) SpO2:  [96 %-100 %] 96 % (09/27 0441) Last BM Date : 11/08/24 (Per patient)  Intake/Output from previous day: 09/26 0701 - 09/27 0700 In: 3 [I.V.:3] Out: 50 [Urine:50] Intake/Output this shift: No intake/output data recorded.  General appearance: alert and no distress Resp: clear to auscultation bilaterally Cardio: regular rate and rhythm Extremities: LEFT groin incision- dressing- C/D/I, soft, leg warm to ankle, +motor/sensory in foot  Lab Results:  Recent Labs    11/09/23 0448 11/10/23 0413  WBC 8.5 9.4  HGB 13.7 12.9  HCT 41.1 39.0  PLT 181 164   BMET Recent Labs    11/08/23 1611 11/09/23 0448  NA  --  137  K  --  3.9  CL  --  101  CO2  --  27  GLUCOSE  --  142*  BUN 6 5*  CREATININE 0.79 0.50  CALCIUM   --  8.4*   PT/INR Recent Labs    11/08/23 1611  LABPROT 14.2  INR 1.0   ABG No results for input(s): PHART, HCO3 in the last 72 hours.  Invalid input(s): PCO2, PO2  Studies/Results: No results found.  Anti-infectives: Anti-infectives (From admission, onward)    Start     Dose/Rate Route Frequency Ordered Stop   11/09/23 1402  gentamicin  (GARAMYCIN ) injection  Status:  Discontinued          As needed 11/09/23 1402 11/09/23 1520   11/09/23 1402  vancomycin  (VANCOCIN ) powder  Status:  Discontinued          As needed 11/09/23 1402 11/09/23 1520   11/09/23 0122  vancomycin  (VANCOCIN ) IVPB 1000 mg/200 mL premix        1,000 mg 200 mL/hr over 60 Minutes Intravenous 60 min pre-op 11/09/23 0122 11/09/23 1352   11/08/23 1230  ceFAZolin  (ANCEF ) IVPB 2g/100 mL premix         2 g 200 mL/hr over 30 Minutes Intravenous 30 min pre-op 11/08/23 1230 11/08/23 1501       Assessment/Plan: s/p Procedure(s): ENDARTERECTOMY, FEMORAL (Left) APPLICATION OF CELL SAVER (Left) THROMBECTOMY, ARTERY, FEMORAL and Popliteal (Left) Continue OOB with PT as tolerated ASA/Plavix Monitor foot Keep in house through weekend  LOS: 3 days    Tisa Dakin A 11/11/2023

## 2023-11-12 LAB — GLUCOSE, CAPILLARY
Glucose-Capillary: 138 mg/dL — ABNORMAL HIGH (ref 70–99)
Glucose-Capillary: 180 mg/dL — ABNORMAL HIGH (ref 70–99)
Glucose-Capillary: 137 mg/dL — ABNORMAL HIGH (ref 70–99)
Glucose-Capillary: 180 mg/dL — ABNORMAL HIGH (ref 70–99)

## 2023-11-12 LAB — HEPARIN LEVEL (UNFRACTIONATED)
Heparin Unfractionated: 0.64 [IU]/mL (ref 0.30–0.70)
Heparin Unfractionated: 0.58 [IU]/mL (ref 0.30–0.70)

## 2023-11-12 MED ORDER — HEPARIN BOLUS VIA INFUSION
4000.0000 [IU] | Freq: Once | INTRAVENOUS | Status: AC
  Administered 2023-11-12: 4000 [IU] via INTRAVENOUS
  Filled 2023-11-12: qty 4000

## 2023-11-12 MED ORDER — HEPARIN (PORCINE) 25000 UT/250ML-% IV SOLN
10.0000 [IU]/kg/h | INTRAVENOUS | Status: DC
Start: 2023-11-12 — End: 2023-11-12

## 2023-11-12 MED ORDER — HEPARIN (PORCINE) 25000 UT/250ML-% IV SOLN
1100.0000 [IU]/h | INTRAVENOUS | Status: DC
  Administered 2023-11-12 – 2023-11-15 (×4): 1100 [IU]/h via INTRAVENOUS
  Filled 2023-11-12 (×4): qty 250

## 2023-11-12 NOTE — Progress Notes (Signed)
 PHARMACY - ANTICOAGULATION CONSULT NOTE  Pharmacy Consult for heparin  infusion Indication: Critical limb ischemia   Allergies  Allergen Reactions   Bee Venom Anaphylaxis    Patient Measurements: Height: 5' (152.4 cm) Weight: 58.3 kg (128 lb 9.6 oz) IBW/kg (Calculated) : 45.5 HEPARIN  DW (KG): 57.3  Vital Signs: Temp: 98.3 F (36.8 C) (09/28 0425) BP: 138/72 (09/28 0713) Pulse Rate: 100 (09/28 0713)  Labs: Recent Labs    11/10/23 0413  HGB 12.9  HCT 39.0  PLT 164    Estimated Creatinine Clearance: 65 mL/min (by C-G formula based on SCr of 0.5 mg/dL).   Medical History: Past Medical History:  Diagnosis Date   Coronary artery disease    Diabetes mellitus without complication (HCC)    Hypertension    MI (myocardial infarction) (HCC)    PVD (peripheral vascular disease)    Tick bite    Pt states she does not remember daye of tic bite, but that is was since last visit with pain clinic and she was hospitalized for 1 day.    Medications:  Scheduled:   aspirin  EC  81 mg Oral Daily   atorvastatin   20 mg Oral Daily   Chlorhexidine  Gluconate Cloth  6 each Topical Daily   docusate sodium  100 mg Oral BID   DULoxetine   30 mg Oral Daily   gabapentin   300 mg Oral TID   insulin  aspart  0-15 Units Subcutaneous TID WC   insulin  aspart  0-5 Units Subcutaneous QHS   lisinopril   20 mg Oral Daily   pantoprazole   40 mg Oral Daily   sodium chloride  flush  3 mL Intravenous Q12H   Infusions:   heparin      PRN: acetaminophen , magnesium  hydroxide, morphine  injection, nitroGLYCERIN , oxyCODONE , sodium chloride  flush  Assessment: Patient presents to [facility] for treatment of her left lower extremity ischemia. On 11/06/23, she presented to [facility] and was evaluated in the emergency room and noted to have changes of the left forefoot consistent with ischemia. Pharmacy has been consulted to initiate and monitor continuous heparin  infusion. No chronic  anticoagulation prior to arrival  Goal of Therapy:  anti-Xa level  0.3-0.7 units/ml Monitor platelets by anticoagulation protocol: Yes   Plan:  ---will give 4000 units IV heparin  bolus x 1 ---Start heparin  infusion at 1100 units/hr ---Check anti-Xa level in 6 hours after initiation ---Continue to monitor H&H and platelets  Adriana JONETTA Bolster 11/12/2023,8:13 AM

## 2023-11-12 NOTE — Progress Notes (Signed)
 PHARMACY - ANTICOAGULATION CONSULT NOTE  Pharmacy Consult for heparin  infusion Indication: Critical limb ischemia   Allergies  Allergen Reactions   Bee Venom Anaphylaxis    Patient Measurements: Height: 5' (152.4 cm) Weight: 58.3 kg (128 lb 9.6 oz) IBW/kg (Calculated) : 45.5 HEPARIN  DW (KG): 57.3  Vital Signs: Temp: 98 F (36.7 C) (09/28 1607) BP: 149/84 (09/28 1607) Pulse Rate: 97 (09/28 1607)  Labs: Recent Labs    11/10/23 0413 11/12/23 1536  HGB 12.9  --   HCT 39.0  --   PLT 164  --   HEPARINUNFRC  --  0.64    Estimated Creatinine Clearance: 65 mL/min (by C-G formula based on SCr of 0.5 mg/dL).   Medical History: Past Medical History:  Diagnosis Date   Coronary artery disease    Diabetes mellitus without complication (HCC)    Hypertension    MI (myocardial infarction) (HCC)    PVD (peripheral vascular disease)    Tick bite    Pt states she does not remember daye of tic bite, but that is was since last visit with pain clinic and she was hospitalized for 1 day.    Medications:  Scheduled:   aspirin  EC  81 mg Oral Daily   atorvastatin   20 mg Oral Daily   Chlorhexidine  Gluconate Cloth  6 each Topical Daily   docusate sodium  100 mg Oral BID   DULoxetine   30 mg Oral Daily   gabapentin   300 mg Oral TID   insulin  aspart  0-15 Units Subcutaneous TID WC   insulin  aspart  0-5 Units Subcutaneous QHS   lisinopril   20 mg Oral Daily   pantoprazole   40 mg Oral Daily   sodium chloride  flush  3 mL Intravenous Q12H   Infusions:   heparin  1,100 Units/hr (11/12/23 0917)   PRN: acetaminophen , magnesium  hydroxide, morphine  injection, nitroGLYCERIN , oxyCODONE , sodium chloride  flush  Assessment: Patient presents to [facility] for treatment of her left lower extremity ischemia. On 11/06/23, she presented to [facility] and was evaluated in the emergency room and noted to have changes of the left forefoot consistent with ischemia. Pharmacy has been  consulted to initiate and monitor continuous heparin  infusion. No chronic anticoagulation prior to arrival  Goal of Therapy:  anti-Xa level  0.3-0.7 units/ml Monitor platelets by anticoagulation protocol: Yes  Monitoring: 0928 @ 1536: HL 0.64 Therapeutic x 1 (1100 units/hr)   Plan: ---Continue heparin  infusion at 1100 units/hr ---Check confirmatory anti-Xa level in 6 hours after last draw ---Continue to monitor H&H and platelets  Will M. Lenon, PharmD, BCPS Clinical Pharmacist 11/12/2023 4:12 PM

## 2023-11-12 NOTE — Progress Notes (Signed)
 3 Days Post-Op   Subjective/Chief Complaint: States her LEFT foot now hurts. Ambulating independently to bathroom.   Objective: Vital signs in last 24 hours: Temp:  [98.3 F (36.8 C)-98.6 F (37 C)] 98.3 F (36.8 C) (09/28 0425) Pulse Rate:  [100-103] 100 (09/28 0713) Resp:  [16-18] 17 (09/28 0713) BP: (138-151)/(72-81) 138/72 (09/28 0713) SpO2:  [90 %-100 %] 100 % (09/28 0713) Last BM Date : 11/07/23  Intake/Output from previous day: 09/27 0701 - 09/28 0700 In: 290 [P.O.:290] Out: -  Intake/Output this shift: No intake/output data recorded.  General appearance: alert and no distress Resp: clear to auscultation bilaterally Extremities: LEFT groin incision- dressing- C/D/I, soft, Left leg warm to ankle, foot now with increased erythema- tender to touch, +motor/+sensory  Lab Results:  Recent Labs    11/10/23 0413  WBC 9.4  HGB 12.9  HCT 39.0  PLT 164   BMET No results for input(s): NA, K, CL, CO2, GLUCOSE, BUN, CREATININE, CALCIUM  in the last 72 hours. PT/INR No results for input(s): LABPROT, INR in the last 72 hours. ABG No results for input(s): PHART, HCO3 in the last 72 hours.  Invalid input(s): PCO2, PO2  Studies/Results: No results found.  Anti-infectives: Anti-infectives (From admission, onward)    Start     Dose/Rate Route Frequency Ordered Stop   11/09/23 1402  gentamicin  (GARAMYCIN ) injection  Status:  Discontinued          As needed 11/09/23 1402 11/09/23 1520   11/09/23 1402  vancomycin  (VANCOCIN ) powder  Status:  Discontinued          As needed 11/09/23 1402 11/09/23 1520   11/09/23 0122  vancomycin  (VANCOCIN ) IVPB 1000 mg/200 mL premix        1,000 mg 200 mL/hr over 60 Minutes Intravenous 60 min pre-op 11/09/23 0122 11/09/23 1352   11/08/23 1230  ceFAZolin  (ANCEF ) IVPB 2g/100 mL premix        2 g 200 mL/hr over 30 Minutes Intravenous 30 min pre-op 11/08/23 1230 11/08/23 1501       Assessment/Plan: s/p  Procedure(s): ENDARTERECTOMY, FEMORAL (Left) APPLICATION OF CELL SAVER (Left) THROMBECTOMY, ARTERY, FEMORAL and Popliteal (Left) Resume Heparin  gtt Monitor exam  LOS: 4 days    Tisa Dakin A 11/12/2023

## 2023-11-12 NOTE — Progress Notes (Signed)
 PHARMACY - ANTICOAGULATION CONSULT NOTE  Pharmacy Consult for heparin  infusion Indication: Critical limb ischemia   Allergies  Allergen Reactions   Bee Venom Anaphylaxis    Patient Measurements: Height: 5' (152.4 cm) Weight: 58.3 kg (128 lb 9.6 oz) IBW/kg (Calculated) : 45.5 HEPARIN  DW (KG): 57.3  Vital Signs: Temp: 99.1 F (37.3 C) (09/28 1934) BP: 160/74 (09/28 1934) Pulse Rate: 99 (09/28 1934)  Labs: Recent Labs    11/10/23 0413 11/12/23 1536 11/12/23 2124  HGB 12.9  --   --   HCT 39.0  --   --   PLT 164  --   --   HEPARINUNFRC  --  0.64 0.58    Estimated Creatinine Clearance: 65 mL/min (by C-G formula based on SCr of 0.5 mg/dL).   Medical History: Past Medical History:  Diagnosis Date   Coronary artery disease    Diabetes mellitus without complication (HCC)    Hypertension    MI (myocardial infarction) (HCC)    PVD (peripheral vascular disease)    Tick bite    Pt states she does not remember daye of tic bite, but that is was since last visit with pain clinic and she was hospitalized for 1 day.    Medications:  Scheduled:   aspirin  EC  81 mg Oral Daily   atorvastatin   20 mg Oral Daily   Chlorhexidine  Gluconate Cloth  6 each Topical Daily   docusate sodium  100 mg Oral BID   DULoxetine   30 mg Oral Daily   gabapentin   300 mg Oral TID   insulin  aspart  0-15 Units Subcutaneous TID WC   insulin  aspart  0-5 Units Subcutaneous QHS   lisinopril   20 mg Oral Daily   pantoprazole   40 mg Oral Daily   sodium chloride  flush  3 mL Intravenous Q12H   Infusions:   heparin  1,100 Units/hr (11/12/23 0917)   PRN: acetaminophen , magnesium  hydroxide, morphine  injection, nitroGLYCERIN , oxyCODONE , sodium chloride  flush  Assessment: Patient presents to [facility] for treatment of her left lower extremity ischemia. On 11/06/23, she presented to [facility] and was evaluated in the emergency room and noted to have changes of the left forefoot  consistent with ischemia. Pharmacy has been consulted to initiate and monitor continuous heparin  infusion. No chronic anticoagulation prior to arrival  Goal of Therapy:  anti-Xa level  0.3-0.7 units/ml Monitor platelets by anticoagulation protocol: Yes  Monitoring: 0928 @ 1536: HL 0.64 Therapeutic x 1 (1100 units/hr) 0928 @ 2124: HL 0.58           Therapeutic X 2    Plan: ---Continue heparin  infusion at 1100 units/hr ---Recheck HL on 9/29 with AM labs  ---Continue to monitor H&H and platelets  Tyaisha Cullom D Clinical Pharmacist 11/12/2023 9:54 PM

## 2023-11-13 ENCOUNTER — Encounter: Payer: Self-pay | Admitting: Vascular Surgery

## 2023-11-13 LAB — HEPARIN LEVEL (UNFRACTIONATED): Heparin Unfractionated: 0.6 [IU]/mL (ref 0.30–0.70)

## 2023-11-13 LAB — CBC
HCT: 38.1 % (ref 36.0–46.0)
Hemoglobin: 12.7 g/dL (ref 12.0–15.0)
MCH: 34 pg (ref 26.0–34.0)
MCHC: 33.3 g/dL (ref 30.0–36.0)
MCV: 102.1 fL — ABNORMAL HIGH (ref 80.0–100.0)
Platelets: 204 K/uL (ref 150–400)
RBC: 3.73 MIL/uL — ABNORMAL LOW (ref 3.87–5.11)
RDW: 13.8 % (ref 11.5–15.5)
WBC: 8.3 K/uL (ref 4.0–10.5)
nRBC: 0 % (ref 0.0–0.2)

## 2023-11-13 LAB — GLUCOSE, CAPILLARY
Glucose-Capillary: 213 mg/dL — ABNORMAL HIGH (ref 70–99)
Glucose-Capillary: 110 mg/dL — ABNORMAL HIGH (ref 70–99)
Glucose-Capillary: 207 mg/dL — ABNORMAL HIGH (ref 70–99)
Glucose-Capillary: 135 mg/dL — ABNORMAL HIGH (ref 70–99)

## 2023-11-13 LAB — SURGICAL PATHOLOGY

## 2023-11-13 MED ORDER — POLYETHYLENE GLYCOL 3350 17 G PO PACK
17.0000 g | PACK | Freq: Every day | ORAL | Status: DC
  Administered 2023-11-13 – 2023-11-20 (×5): 17 g via ORAL
  Filled 2023-11-13 (×9): qty 1

## 2023-11-13 MED ORDER — POLYETHYLENE GLYCOL 3350 17 G PO PACK
17.0000 g | PACK | Freq: Every day | ORAL | Status: DC | PRN
  Administered 2023-11-18 – 2023-11-19 (×2): 17 g via ORAL
  Filled 2023-11-13 (×2): qty 1

## 2023-11-13 NOTE — Progress Notes (Signed)
 Progress Note    11/13/2023 7:15 PM 4 Days Post-Op  Subjective:  Claudia Mills is a 53 yo female now POD #4 from:  PROCEDURE: 1.   Left common femoral endarterectomy and  bovine pericardial patch angioplasty 2.   Left SFA and popliteal artery embolectomy with a 3 [place] embolectomy balloon   AND   POD #2 FROM:   Procedure(s) Performed:             1.  Introduction catheter into left lower extremity 3rd order catheter placement              2.  Contrast injection left lower extremity for distal runoff             3.  Percutaneous transluminal angioplasty and stent placement left external iliac artery.             4.  Percutaneous mechanical thrombectomy of the left superficial femoral artery with a combination of 10 mg of tPA and the [place] thrombectomy catheter.                5. Percutaneous transluminal angioplasty left superficial femoral artery.               6.  Celt closure right common femoral arteriotomy   Patient is resting comfortably in bed this morning.  Patient endorses her left leg toes are very painful. Pain s 8 out of 10. .  She also endorses her leg feels warmer today except for her forefoot and toes.  Vitals all remained stable.   Vitals:   11/13/23 0330 11/13/23 1747  BP: (!) 176/84 135/75  Pulse: 98 (!) 102  Resp: 17 15  Temp: 98.1 F (36.7 C) 98.6 F (37 C)  SpO2: 100% 100%   Physical Exam: Cardiac:  RRR, normal S1 and S2.  No murmurs. Lungs: Clear throughout on auscultation but diminished in the bases.  No rales rhonchi or wheezing noted. Incisions: Left groin incision with dressing clean dry and intact.  No hematoma seroma no. Extremities: Bilateral lower extremities warm to touch with good Doppler pulses.  Patient does not have a Doppler pulse in her left dorsalis pedis. Abdomen: Positive bowel sounds throughout, soft, nontender nondistended. Neurologic: Alert and oriented x 3, answers questions and follows commands  CBC    Component Value  Date/Time   WBC 8.3 11/13/2023 0430   RBC 3.73 (L) 11/13/2023 0430   HGB 12.7 11/13/2023 0430   HCT 38.1 11/13/2023 0430   PLT 204 11/13/2023 0430   MCV 102.1 (H) 11/13/2023 0430   MCH 34.0 11/13/2023 0430   MCHC 33.3 11/13/2023 0430   RDW 13.8 11/13/2023 0430   LYMPHSABS 2.2 09/20/2021 1849   MONOABS 0.4 09/20/2021 1849   EOSABS 0.1 09/20/2021 1849   BASOSABS 0.1 09/20/2021 1849    BMET    Component Value Date/Time   NA 137 11/09/2023 0448   K 3.9 11/09/2023 0448   CL 101 11/09/2023 0448   CO2 27 11/09/2023 0448   GLUCOSE 142 (H) 11/09/2023 0448   BUN 5 (L) 11/09/2023 0448   CREATININE 0.50 11/09/2023 0448   CREATININE 0.58 09/20/2022 0930   CALCIUM  8.4 (L) 11/09/2023 0448   GFRNONAA >60 11/09/2023 0448   GFRAA >60 12/30/2015 2048    INR    Component Value Date/Time   INR 1.0 11/08/2023 1611     Intake/Output Summary (Last 24 hours) at 11/13/2023 1915 Last data filed at 11/13/2023 1549 Gross per 24 hour  Intake 501.28 ml  Output --  Net 501.28 ml     Assessment/Plan:  53 y.o. female is s/p SEE ABOVE  4 Days Post-Op   Plan  Vascular Surgery plans on taking the patient to the vascular Lab for a left lower extremity angiogram with possible intervention. Plan is to recheck patient distal blood flow. I discussed the plan and the procedure with the patient this morning in detail. She continues to be on heparin  infusion. She wishes to decide on agreeing to another angiogram in the morning.  I will check on her status tomorrow morning to determine as to proceed with an intervention. I have made her NPO after midnight in preparation for possible intervention.   Plan was discussed with Dr [name] MD.   DVT prophylaxis:  Heparin  Infusion   Claudia Mills [facility] 11/13/2023 7:15 PM

## 2023-11-13 NOTE — Plan of Care (Signed)

## 2023-11-13 NOTE — Plan of Care (Signed)
 Problem: Education: Goal: Knowledge of General Education information will improve Description: Including pain rating scale, medication(s)/side effects and non-pharmacologic comfort measures 11/13/2023 2055 by Arlyss All D, RN Outcome: Progressing 11/13/2023 2055 by Arlyss All BIRCH, RN Outcome: Progressing   Problem: Health Behavior/Discharge Planning: Goal: Ability to manage health-related needs will improve 11/13/2023 2055 by Arlyss All D, RN Outcome: Progressing 11/13/2023 2055 by Arlyss All D, RN Outcome: Progressing   Problem: Clinical Measurements: Goal: Ability to maintain clinical measurements within normal limits will improve 11/13/2023 2055 by Arlyss All D, RN Outcome: Progressing 11/13/2023 2055 by Arlyss All D, RN Outcome: Progressing Goal: Will remain free from infection 11/13/2023 2055 by Arlyss All D, RN Outcome: Progressing 11/13/2023 2055 by Arlyss All D, RN Outcome: Progressing Goal: Diagnostic test results will improve 11/13/2023 2055 by Arlyss All D, RN Outcome: Progressing 11/13/2023 2055 by Arlyss All D, RN Outcome: Progressing Goal: Respiratory complications will improve 11/13/2023 2055 by Arlyss All D, RN Outcome: Progressing 11/13/2023 2055 by Arlyss All D, RN Outcome: Progressing Goal: Cardiovascular complication will be avoided 11/13/2023 2055 by Arlyss All D, RN Outcome: Progressing 11/13/2023 2055 by Arlyss All BIRCH, RN Outcome: Progressing   Problem: Activity: Goal: Risk for activity intolerance will decrease 11/13/2023 2055 by Arlyss All D, RN Outcome: Progressing 11/13/2023 2055 by Arlyss All D, RN Outcome: Progressing   Problem: Nutrition: Goal: Adequate nutrition will be maintained 11/13/2023 2055 by Arlyss All D, RN Outcome: Progressing 11/13/2023 2055 by Arlyss All D, RN Outcome: Progressing   Problem: Coping: Goal: Level of anxiety will decrease 11/13/2023  2055 by Arlyss All D, RN Outcome: Progressing 11/13/2023 2055 by Arlyss All D, RN Outcome: Progressing   Problem: Elimination: Goal: Will not experience complications related to bowel motility 11/13/2023 2055 by Arlyss All D, RN Outcome: Progressing 11/13/2023 2055 by Arlyss All D, RN Outcome: Progressing Goal: Will not experience complications related to urinary retention 11/13/2023 2055 by Arlyss All BIRCH, RN Outcome: Progressing 11/13/2023 2055 by Arlyss All D, RN Outcome: Progressing   Problem: Pain Managment: Goal: General experience of comfort will improve and/or be controlled 11/13/2023 2055 by Arlyss All D, RN Outcome: Progressing 11/13/2023 2055 by Arlyss All D, RN Outcome: Progressing   Problem: Safety: Goal: Ability to remain free from injury will improve 11/13/2023 2055 by Arlyss All BIRCH, RN Outcome: Progressing 11/13/2023 2055 by Arlyss All D, RN Outcome: Progressing   Problem: Skin Integrity: Goal: Risk for impaired skin integrity will decrease 11/13/2023 2055 by Arlyss All D, RN Outcome: Progressing 11/13/2023 2055 by Arlyss All D, RN Outcome: Progressing   Problem: Education: Goal: Ability to describe self-care measures that may prevent or decrease complications (Diabetes Survival Skills Education) will improve 11/13/2023 2055 by Arlyss All BIRCH, RN Outcome: Progressing 11/13/2023 2055 by Arlyss All BIRCH, RN Outcome: Progressing Goal: Individualized Educational Video(s) 11/13/2023 2055 by Arlyss All D, RN Outcome: Progressing 11/13/2023 2055 by Arlyss All D, RN Outcome: Progressing   Problem: Coping: Goal: Ability to adjust to condition or change in health will improve 11/13/2023 2055 by Arlyss All D, RN Outcome: Progressing 11/13/2023 2055 by Arlyss All D, RN Outcome: Progressing   Problem: Fluid Volume: Goal: Ability to maintain a balanced intake and output will improve 11/13/2023  2055 by Arlyss All D, RN Outcome: Progressing 11/13/2023 2055 by Arlyss All BIRCH, RN Outcome: Progressing   Problem: Health Behavior/Discharge Planning: Goal: Ability to identify and utilize available resources and services will improve 11/13/2023 2055 by Arlyss All BIRCH, RN Outcome: Progressing 11/13/2023 2055 by  Arlyss All D, RN Outcome: Progressing Goal: Ability to manage health-related needs will improve 11/13/2023 2055 by Arlyss All BIRCH, RN Outcome: Progressing 11/13/2023 2055 by Arlyss All D, RN Outcome: Progressing   Problem: Metabolic: Goal: Ability to maintain appropriate glucose levels will improve 11/13/2023 2055 by Arlyss All D, RN Outcome: Progressing 11/13/2023 2055 by Arlyss All D, RN Outcome: Progressing   Problem: Nutritional: Goal: Maintenance of adequate nutrition will improve 11/13/2023 2055 by Arlyss All D, RN Outcome: Progressing 11/13/2023 2055 by Arlyss All D, RN Outcome: Progressing Goal: Progress toward achieving an optimal weight will improve 11/13/2023 2055 by Arlyss All D, RN Outcome: Progressing 11/13/2023 2055 by Arlyss All D, RN Outcome: Progressing   Problem: Skin Integrity: Goal: Risk for impaired skin integrity will decrease 11/13/2023 2055 by Arlyss All D, RN Outcome: Progressing 11/13/2023 2055 by Arlyss All D, RN Outcome: Progressing   Problem: Tissue Perfusion: Goal: Adequacy of tissue perfusion will improve 11/13/2023 2055 by Arlyss All D, RN Outcome: Progressing 11/13/2023 2055 by Arlyss All D, RN Outcome: Progressing   Problem: Education: Goal: Understanding of CV disease, CV risk reduction, and recovery process will improve 11/13/2023 2055 by Arlyss All BIRCH, RN Outcome: Progressing 11/13/2023 2055 by Arlyss All BIRCH, RN Outcome: Progressing Goal: Individualized Educational Video(s) 11/13/2023 2055 by Arlyss All D, RN Outcome: Progressing 11/13/2023 2055  by Arlyss All BIRCH, RN Outcome: Progressing   Problem: Activity: Goal: Ability to return to baseline activity level will improve 11/13/2023 2055 by Arlyss All D, RN Outcome: Progressing 11/13/2023 2055 by Arlyss All D, RN Outcome: Progressing   Problem: Cardiovascular: Goal: Ability to achieve and maintain adequate cardiovascular perfusion will improve 11/13/2023 2055 by Arlyss All D, RN Outcome: Progressing 11/13/2023 2055 by Arlyss All D, RN Outcome: Progressing Goal: Vascular access site(s) Level 0-1 will be maintained 11/13/2023 2055 by Arlyss All D, RN Outcome: Progressing 11/13/2023 2055 by Arlyss All D, RN Outcome: Progressing   Problem: Health Behavior/Discharge Planning: Goal: Ability to safely manage health-related needs after discharge will improve 11/13/2023 2055 by Arlyss All BIRCH, RN Outcome: Progressing 11/13/2023 2055 by Arlyss All BIRCH, RN Outcome: Progressing

## 2023-11-13 NOTE — Progress Notes (Signed)
 PHARMACY - ANTICOAGULATION CONSULT NOTE  Pharmacy Consult for heparin  infusion Indication: Critical limb ischemia   Allergies  Allergen Reactions   Bee Venom Anaphylaxis    Patient Measurements: Height: 5' (152.4 cm) Weight: 58.3 kg (128 lb 9.6 oz) IBW/kg (Calculated) : 45.5 HEPARIN  DW (KG): 57.3  Vital Signs: Temp: 98.1 F (36.7 C) (09/29 0330) BP: 176/84 (09/29 0330) Pulse Rate: 98 (09/29 0330)  Labs: Recent Labs    11/12/23 1536 11/12/23 2124 11/13/23 0430  HGB  --   --  12.7  HCT  --   --  38.1  PLT  --   --  204  HEPARINUNFRC 0.64 0.58 0.60    Estimated Creatinine Clearance: 65 mL/min (by C-G formula based on SCr of 0.5 mg/dL).   Medical History: Past Medical History:  Diagnosis Date   Coronary artery disease    Diabetes mellitus without complication (HCC)    Hypertension    MI (myocardial infarction) (HCC)    PVD (peripheral vascular disease)    Tick bite    Pt states she does not remember daye of tic bite, but that is was since last visit with pain clinic and she was hospitalized for 1 day.    Medications:  Scheduled:   aspirin  EC  81 mg Oral Daily   atorvastatin   20 mg Oral Daily   Chlorhexidine  Gluconate Cloth  6 each Topical Daily   docusate sodium  100 mg Oral BID   DULoxetine   30 mg Oral Daily   gabapentin   300 mg Oral TID   insulin  aspart  0-15 Units Subcutaneous TID WC   insulin  aspart  0-5 Units Subcutaneous QHS   lisinopril   20 mg Oral Daily   pantoprazole   40 mg Oral Daily   sodium chloride  flush  3 mL Intravenous Q12H   Infusions:   heparin  1,100 Units/hr (11/13/23 0310)   PRN: acetaminophen , magnesium  hydroxide, morphine  injection, nitroGLYCERIN , oxyCODONE , sodium chloride  flush  Assessment: Patient presents to [facility] for treatment of her left lower extremity ischemia. On 11/06/23, she presented to [facility] and was evaluated in the emergency room and noted to have changes of the left forefoot  consistent with ischemia. Pharmacy has been consulted to initiate and monitor continuous heparin  infusion. No chronic anticoagulation prior to arrival  Goal of Therapy:  anti-Xa level  0.3-0.7 units/ml Monitor platelets by anticoagulation protocol: Yes  Monitoring: 0928 @ 1536: HL 0.64 Therapeutic x 1 (1100 units/hr) 0928 @ 2124: HL 0.58           Therapeutic X 2  0929 @ 0430: HL 0.60           Therapeutic X 5    Plan: ---Continue heparin  infusion at 1100 units/hr ---Recheck HL on 9/30 with AM labs  ---Continue to monitor H&H and platelets  Siah Kannan D Clinical Pharmacist 11/13/2023 6:48 AM

## 2023-11-14 LAB — GLUCOSE, CAPILLARY
Glucose-Capillary: 171 mg/dL — ABNORMAL HIGH (ref 70–99)
Glucose-Capillary: 138 mg/dL — ABNORMAL HIGH (ref 70–99)
Glucose-Capillary: 240 mg/dL — ABNORMAL HIGH (ref 70–99)

## 2023-11-14 LAB — HEPARIN LEVEL (UNFRACTIONATED): Heparin Unfractionated: 0.55 [IU]/mL (ref 0.30–0.70)

## 2023-11-14 NOTE — Progress Notes (Signed)
 Patient's procedure has been moved to tomorrow Wednesday, 11/15/2023 due to time constraints in the vascular lab today.  I physically went and spoke with the patient earlier this morning to let her know of the situation.  She was agreeable and understandable.  Patient will be made n.p.o. after midnight tonight for procedure tomorrow that was planned today.  Dr. [name] MD is made aware

## 2023-11-14 NOTE — Progress Notes (Signed)
 PHARMACY - ANTICOAGULATION CONSULT NOTE  Pharmacy Consult for heparin  infusion Indication: Critical limb ischemia   Allergies  Allergen Reactions   Bee Venom Anaphylaxis    Patient Measurements: Height: 5' (152.4 cm) Weight: 58.3 kg (128 lb 9.6 oz) IBW/kg (Calculated) : 45.5 HEPARIN  DW (KG): 57.3  Vital Signs: Temp: 98.5 F (36.9 C) (09/30 0148) BP: 136/69 (09/30 0148) Pulse Rate: 107 (09/30 0148)  Labs: Recent Labs    11/12/23 2124 11/13/23 0430 11/14/23 0338  HGB  --  12.7  --   HCT  --  38.1  --   PLT  --  204  --   HEPARINUNFRC 0.58 0.60 0.55    Estimated Creatinine Clearance: 65 mL/min (by C-G formula based on SCr of 0.5 mg/dL).   Medical History: Past Medical History:  Diagnosis Date   Coronary artery disease    Diabetes mellitus without complication (HCC)    Hypertension    MI (myocardial infarction) (HCC)    PVD (peripheral vascular disease)    Tick bite    Pt states she does not remember daye of tic bite, but that is was since last visit with pain clinic and she was hospitalized for 1 day.    Medications:  Scheduled:   aspirin  EC  81 mg Oral Daily   atorvastatin   20 mg Oral Daily   Chlorhexidine  Gluconate Cloth  6 each Topical Daily   docusate sodium  100 mg Oral BID   DULoxetine   30 mg Oral Daily   gabapentin   300 mg Oral TID   insulin  aspart  0-15 Units Subcutaneous TID WC   insulin  aspart  0-5 Units Subcutaneous QHS   lisinopril   20 mg Oral Daily   pantoprazole   40 mg Oral Daily   polyethylene glycol  17 g Oral Daily   sodium chloride  flush  3 mL Intravenous Q12H   Infusions:   heparin  1,100 Units/hr (11/14/23 0139)   PRN: acetaminophen , magnesium  hydroxide, morphine  injection, nitroGLYCERIN , oxyCODONE , polyethylene glycol, sodium chloride  flush  Assessment: Patient presents to [facility] for treatment of her left lower extremity ischemia. On 11/06/23, she presented to [facility] and was evaluated in the  emergency room and noted to have changes of the left forefoot consistent with ischemia. Pharmacy has been consulted to initiate and monitor continuous heparin  infusion. No chronic anticoagulation prior to arrival  Goal of Therapy:  anti-Xa level  0.3-0.7 units/ml Monitor platelets by anticoagulation protocol: Yes  Monitoring: 0928 @ 1536: HL 0.64 Therapeutic x 1 (1100 units/hr) 0928 @ 2124: HL 0.58           Therapeutic X 2  0929 @ 0430: HL 0.60           Therapeutic X 3 0930 0338 HL 0.55  Therapeutic x 4    Plan: ---Continue heparin  infusion at 1100 units/hr ---Recheck HL on 10/01 with AM labs  ---Continue to monitor H&H and platelets  Rankin CANDIE Dills, PharmD, [facility] 11/14/2023 5:43 AM

## 2023-11-14 NOTE — Plan of Care (Signed)
  Problem: Health Behavior/Discharge Planning: Goal: Ability to manage health-related needs will improve Outcome: Progressing   Problem: Clinical Measurements: Goal: Ability to maintain clinical measurements within normal limits will improve Outcome: Progressing Goal: Will remain free from infection Outcome: Progressing   Problem: Nutrition: Goal: Adequate nutrition will be maintained Outcome: Progressing   Problem: Coping: Goal: Level of anxiety will decrease Outcome: Progressing   

## 2023-11-14 NOTE — Progress Notes (Signed)
 Progress Note    11/14/2023 7:32 AM 5 Days Post-Op  Subjective:  Claudia Mills is a 53 yo female now POD #4 from:   PROCEDURE: 1.   Left common femoral endarterectomy and  bovine pericardial patch angioplasty 2.   Left SFA and popliteal artery embolectomy with a 3 Fogarty embolectomy balloon   AND   POD #2 FROM:   Procedure(s) Performed:             1.  Introduction catheter into left lower extremity 3rd order catheter placement              2.  Contrast injection left lower extremity for distal runoff             3.  Percutaneous transluminal angioplasty and stent placement left external iliac artery.             4.  Percutaneous mechanical thrombectomy of the left superficial femoral artery with a combination of 10 mg of tPA and the [place] thrombectomy catheter.                5. Percutaneous transluminal angioplasty left superficial femoral artery.               6.  Celt closure right common femoral arteriotomy   Patient is resting comfortably in bed this morning.  Patient endorses her left leg toes are very painful. Pain s 8 out of 10. .  She also endorses her leg feels warmer today except for her forefoot and toes.  Vitals all remained stable.   Vitals:   11/13/23 2035 11/14/23 0148  BP: 134/83 136/69  Pulse: (!) 103 (!) 107  Resp: 18 16  Temp: 99.7 F (37.6 C) 98.5 F (36.9 C)  SpO2: 99% 100%   Physical Exam: Cardiac:  RRR, normal S1 and S2.  No murmurs. Lungs: Clear throughout on auscultation but diminished in the bases.  No rales rhonchi or wheezing noted. Incisions: Left groin incision with dressing clean dry and intact.  No hematoma seroma no. Extremities: Bilateral lower extremities warm to touch with good Doppler pulses.  Patient does not have a Doppler pulse in her left dorsalis pedis. Abdomen: Positive bowel sounds throughout, soft, nontender nondistended. Neurologic: Alert and oriented x 3, answers questions and follows commands  CBC    Component Value  Date/Time   WBC 8.3 11/13/2023 0430   RBC 3.73 (L) 11/13/2023 0430   HGB 12.7 11/13/2023 0430   HCT 38.1 11/13/2023 0430   PLT 204 11/13/2023 0430   MCV 102.1 (H) 11/13/2023 0430   MCH 34.0 11/13/2023 0430   MCHC 33.3 11/13/2023 0430   RDW 13.8 11/13/2023 0430   LYMPHSABS 2.2 09/20/2021 1849   MONOABS 0.4 09/20/2021 1849   EOSABS 0.1 09/20/2021 1849   BASOSABS 0.1 09/20/2021 1849    BMET    Component Value Date/Time   NA 137 11/09/2023 0448   K 3.9 11/09/2023 0448   CL 101 11/09/2023 0448   CO2 27 11/09/2023 0448   GLUCOSE 142 (H) 11/09/2023 0448   BUN 5 (L) 11/09/2023 0448   CREATININE 0.50 11/09/2023 0448   CREATININE 0.58 09/20/2022 0930   CALCIUM  8.4 (L) 11/09/2023 0448   GFRNONAA >60 11/09/2023 0448   GFRAA >60 12/30/2015 2048    INR    Component Value Date/Time   INR 1.0 11/08/2023 1611     Intake/Output Summary (Last 24 hours) at 11/14/2023 0732 Last data filed at 11/14/2023 0012 Gross per 24 hour  Intake 833.45 ml  Output --  Net 833.45 ml     Assessment/Plan:  53 y.o. female is s/p SEE ABOVE 5 Days Post-Op   PLAN Patient continues to have pain to her left lower extremity mostly in her forefoot and toes. Patient taking pain medication without much improvement. Great toe discoloration noted this morning.   We will proceed with left lower extremity angiogram today as planned. Patient endorses she has been NPO since midnight. Patient remains on heparin  infusion.  DVT prophylaxis:  Heparin  infusion    [name] [facility] 11/14/2023 7:32 AM

## 2023-11-14 NOTE — H&P (View-Only) (Signed)
 Patient's procedure has been moved to tomorrow Wednesday, 11/15/2023 due to time constraints in the vascular lab today.  I physically went and spoke with the patient earlier this morning to let her know of the situation.  She was agreeable and understandable.  Patient will be made n.p.o. after midnight tonight for procedure tomorrow that was planned today.  Dr. [name] MD is made aware

## 2023-11-14 NOTE — TOC Progression Note (Signed)
 Transition of Care [facility]) - Progression Note    Patient Details  Name: Claudia Mills MRN: [id number] Date of Birth: 17-Jun-1970  Transition of Care [facility]) CM/SW Contact  Corean ONEIDA Haddock, RN Phone Number: 11/14/2023, 4:26 PM  Clinical Narrative:      Therapy recommending HH Patient in agreement and states she does not have a preference of agency. Referral made to [place]  with [facility].   No DME recommended     Barriers to Discharge: Continued Medical Work up               Expected Discharge Plan and Services                                               Social Drivers of Health (SDOH) Interventions SDOH Screenings   Food Insecurity: No Food Insecurity (10/19/2022)  Housing: Low Risk  (10/19/2022)  Transportation Needs: No Transportation Needs (10/19/2022)  Utilities: Not At Risk (10/19/2022)  Depression (PHQ2-9): Low Risk  (09/12/2023)  Tobacco Use: Low Risk  (11/09/2023)    Readmission Risk Interventions     No data to display

## 2023-11-14 NOTE — Progress Notes (Signed)
 Physical Therapy Treatment Patient Details Name: Claudia Mills MRN: [id number] DOB: 29-Mar-1970 Today's Date: 11/14/2023   History of Present Illness Pt is a 53 y/o F with c/o pain and discoloration of R toes. Past hx of L SFA and external iliac stent on 08/08/23. Pt underwent L common femoral endarterectomy and L SFA thrombectomy on 11/09/23. PMH significant for HTN, DM, PAD. Planning for angio today, however rescheduled for 10/1.    PT Comments  Pt has made great progress since last treatment. Pt reports being ambulatory with nursing staff and has been using the IV pole primarily in room. During session, able to ambulate without device. Discussed discharging from PT caseload and handing care to nursing and mobility specialists. Pt appears to be approaching baseline. Educated on mobility to promote healing. Will plan to sign off, please re-order if needs change.    If plan is discharge home, recommend the following:     Can travel by private vehicle        Equipment Recommendations  None recommended by PT    Recommendations for Other Services       Precautions / Restrictions Precautions Precautions: Fall Recall of Precautions/Restrictions: Intact Restrictions Weight Bearing Restrictions Per Provider Order: No     Mobility  Bed Mobility Overal bed mobility: Independent Bed Mobility: Sit to Supine       Sit to supine: Independent   General bed mobility comments: safe technique. Pt reports she plans to get up to recliner later today as she was able to sit in chair for 3.5 hours on previous date    Transfers Overall transfer level: Independent Equipment used: None Transfers: Sit to/from Stand Sit to Stand: Independent           General transfer comment: safe technique. Good weight acceptance on L foot    Ambulation/Gait Ambulation/Gait assistance: Modified independent (Device/Increase time) Gait Distance (Feet): 80 Feet Assistive device: None Gait Pattern/deviations:  Step-through pattern       General Gait Details: request to stay in room for mobility as she is getting ready for CHG bath and shower. INitially able to ambulate with IV pole and then additional 40' without IV pole with safe technique. Pt is primarily limited by pain   Stairs             Wheelchair Mobility     Tilt Bed    Modified Rankin (Stroke Patients Only)       Balance Overall balance assessment: Needs assistance Sitting-balance support: Feet supported Sitting balance-Leahy Scale: Good     Standing balance support: Bilateral upper extremity supported Standing balance-Leahy Scale: Good                              Communication Communication Communication: No apparent difficulties  Cognition Arousal: Alert Behavior During Therapy: WFL for tasks assessed/performed   PT - Cognitive impairments: No apparent impairments                       PT - Cognition Comments: A&Ox4 Following commands: Intact      Cueing Cueing Techniques: Verbal cues  Exercises Other Exercises Other Exercises: pt found in bathroom to void, this therapist provided supervision and pt able to perform all bathroom activities with supervision. No physical assist needed.    General Comments        Pertinent Vitals/Pain Pain Assessment Pain Assessment: 0-10 Pain Score: 4  Pain Location: L foot  Pain Descriptors / Indicators: Grimacing, Sore Pain Intervention(s): Limited activity within patient's tolerance, Monitored during session    Home Living                          Prior Function            PT Goals (current goals can now be found in the care plan section) Acute Rehab PT Goals Patient Stated Goal: to go home PT Goal Formulation: With patient Time For Goal Achievement: 11/14/23 Potential to Achieve Goals: Good Progress towards PT goals: Goals met/education completed, patient discharged from PT    Frequency    Min 2X/week       PT Plan      Co-evaluation              AM-PAC PT 6 Clicks Mobility   Outcome Measure  Help needed turning from your back to your side while in a flat bed without using bedrails?: None Help needed moving from lying on your back to sitting on the side of a flat bed without using bedrails?: None Help needed moving to and from a bed to a chair (including a wheelchair)?: None Help needed standing up from a chair using your arms (e.g., wheelchair or bedside chair)?: None Help needed to walk in hospital room?: None Help needed climbing 3-5 steps with a railing? : A Little 6 Click Score: 23    End of Session   Activity Tolerance: Patient tolerated treatment well Patient left: in bed Nurse Communication: Mobility status PT Visit Diagnosis: Other abnormalities of gait and mobility (R26.89);Muscle weakness (generalized) (M62.81);Difficulty in walking, not elsewhere classified (R26.2);Pain Pain - Right/Left: Left Pain - part of body: Ankle and joints of foot     Time: 1100-1108 PT Time Calculation (min) (ACUTE ONLY): 8 min  Charges:    $Gait Training: 8-22 mins PT General Charges $$ ACUTE PT VISIT: 1 Visit                     Corean Dade, PT, DPT, [facility] [phone]    [name] 11/14/2023, 12:50 PM

## 2023-11-14 NOTE — Plan of Care (Signed)

## 2023-11-15 ENCOUNTER — Encounter
Admission: AD | Disposition: A | Discharge status: Skilled Nursing Facility | Payer: Self-pay | Source: Home / Self Care | Attending: Vascular Surgery

## 2023-11-15 DIAGNOSIS — I70222 Atherosclerosis of native arteries of extremities with rest pain, left leg: Secondary | ICD-10-CM | POA: Diagnosis not present

## 2023-11-15 HISTORY — PX: LOWER EXTREMITY ANGIOGRAPHY: CATH118251

## 2023-11-15 LAB — CBC
HCT: 37.1 % (ref 36.0–46.0)
Hemoglobin: 12.5 g/dL (ref 12.0–15.0)
MCH: 34.2 pg — ABNORMAL HIGH (ref 26.0–34.0)
MCHC: 33.7 g/dL (ref 30.0–36.0)
MCV: 101.4 fL — ABNORMAL HIGH (ref 80.0–100.0)
Platelets: 227 K/uL (ref 150–400)
RBC: 3.66 MIL/uL — ABNORMAL LOW (ref 3.87–5.11)
RDW: 13.5 % (ref 11.5–15.5)
WBC: 7.9 K/uL (ref 4.0–10.5)
nRBC: 0 % (ref 0.0–0.2)

## 2023-11-15 LAB — GLUCOSE, CAPILLARY
Glucose-Capillary: 158 mg/dL — ABNORMAL HIGH (ref 70–99)
Glucose-Capillary: 171 mg/dL — ABNORMAL HIGH (ref 70–99)
Glucose-Capillary: 151 mg/dL — ABNORMAL HIGH (ref 70–99)
Glucose-Capillary: 282 mg/dL — ABNORMAL HIGH (ref 70–99)

## 2023-11-15 LAB — HEPARIN LEVEL (UNFRACTIONATED): Heparin Unfractionated: 0.52 [IU]/mL (ref 0.30–0.70)

## 2023-11-15 SURGERY — LOWER EXTREMITY ANGIOGRAPHY
Anesthesia: Moderate Sedation | Laterality: Left

## 2023-11-15 MED ORDER — MIDAZOLAM HCL 2 MG/ML PO SYRP: 8.0000 mg | ORAL_SOLUTION | Freq: Once | ORAL | Status: DC | PRN

## 2023-11-15 MED ORDER — DIPHENHYDRAMINE HCL 50 MG/ML IJ SOLN: 50.0000 mg | Freq: Once | INTRAMUSCULAR | Status: DC | PRN

## 2023-11-15 MED ORDER — SODIUM CHLORIDE 0.9% FLUSH: 3.0000 mL | INTRAVENOUS | Status: DC | PRN

## 2023-11-15 MED ORDER — FENTANYL CITRATE (PF) 100 MCG/2ML IJ SOLN
INTRAMUSCULAR | Status: DC | PRN
  Administered 2023-11-15 (×2): 50 ug via INTRAVENOUS

## 2023-11-15 MED ORDER — SODIUM CHLORIDE 0.9% IV SOLUTION: Freq: Once | INTRAVENOUS | Status: DC

## 2023-11-15 MED ORDER — LIDOCAINE HCL (PF) 1 % IJ SOLN
INTRAMUSCULAR | Status: DC | PRN
  Administered 2023-11-15: 10 mL via INTRADERMAL

## 2023-11-15 MED ORDER — MIDAZOLAM HCL 2 MG/2ML IJ SOLN
INTRAMUSCULAR | Status: DC | PRN
  Administered 2023-11-15: 1 mg via INTRAVENOUS
  Administered 2023-11-15: 2 mg via INTRAVENOUS

## 2023-11-15 MED ORDER — ATORVASTATIN CALCIUM 10 MG PO TABS: 10.0000 mg | ORAL_TABLET | Freq: Every day | ORAL | Status: DC

## 2023-11-15 MED ORDER — IODIXANOL 320 MG/ML IV SOLN
INTRAVENOUS | Status: DC | PRN
  Administered 2023-11-15: 30 mL via INTRA_ARTERIAL

## 2023-11-15 MED ORDER — SODIUM CHLORIDE 0.9 % IV SOLN: INTRAVENOUS | Status: DC

## 2023-11-15 MED ORDER — SODIUM CHLORIDE 0.9% FLUSH
3.0000 mL | Freq: Two times a day (BID) | INTRAVENOUS | Status: DC
  Administered 2023-11-15 – 2023-11-23 (×16): 3 mL via INTRAVENOUS

## 2023-11-15 MED ORDER — FAMOTIDINE 20 MG PO TABS: 40.0000 mg | ORAL_TABLET | Freq: Once | ORAL | Status: DC | PRN

## 2023-11-15 MED ORDER — CEFAZOLIN SODIUM-DEXTROSE 2-4 GM/100ML-% IV SOLN
INTRAVENOUS | Status: AC
  Filled 2023-11-15: qty 100

## 2023-11-15 MED ORDER — MIDAZOLAM HCL 5 MG/5ML IJ SOLN
INTRAMUSCULAR | Status: AC
  Filled 2023-11-15: qty 5

## 2023-11-15 MED ORDER — HEPARIN SODIUM (PORCINE) 1000 UNIT/ML IJ SOLN
INTRAMUSCULAR | Status: AC
  Filled 2023-11-15: qty 10

## 2023-11-15 MED ORDER — HEPARIN SODIUM (PORCINE) 1000 UNIT/ML IJ SOLN
INTRAMUSCULAR | Status: DC | PRN
  Administered 2023-11-15: 4000 [IU] via INTRAVENOUS

## 2023-11-15 MED ORDER — FENTANYL CITRATE (PF) 100 MCG/2ML IJ SOLN
INTRAMUSCULAR | Status: AC
  Filled 2023-11-15: qty 2

## 2023-11-15 MED ORDER — CHLORHEXIDINE GLUCONATE 4 % EX SOLN
60.0000 mL | Freq: Once | CUTANEOUS | Status: AC
  Administered 2023-11-16: 4 via TOPICAL

## 2023-11-15 MED ORDER — SODIUM CHLORIDE 0.9 % IV SOLN: INTRAVENOUS | Status: AC

## 2023-11-15 MED ORDER — SODIUM CHLORIDE 0.9 % IV SOLN: 250.0000 mL | INTRAVENOUS | Status: DC | PRN

## 2023-11-15 MED ORDER — CEFAZOLIN SODIUM-DEXTROSE 2-4 GM/100ML-% IV SOLN
2.0000 g | INTRAVENOUS | Status: AC
  Administered 2023-11-15: 2 g via INTRAVENOUS

## 2023-11-15 MED ORDER — HEPARIN (PORCINE) IN NACL 1000-0.9 UT/500ML-% IV SOLN
INTRAVENOUS | Status: DC | PRN
  Administered 2023-11-15: 1000 mL

## 2023-11-15 MED ORDER — VANCOMYCIN HCL IN DEXTROSE 1-5 GM/200ML-% IV SOLN
1000.0000 mg | INTRAVENOUS | Status: AC
  Administered 2023-11-16: 1000 mg via INTRAVENOUS
  Filled 2023-11-15: qty 200

## 2023-11-15 MED ORDER — HEPARIN (PORCINE) 25000 UT/250ML-% IV SOLN
1100.0000 [IU]/h | INTRAVENOUS | Status: DC
  Administered 2023-11-16: 1100 [IU]/h via INTRAVENOUS
  Filled 2023-11-15: qty 250

## 2023-11-15 MED ORDER — SODIUM CHLORIDE 0.9 % IV SOLN
INTRAVENOUS | Status: DC
Start: 2023-11-15 — End: 2023-11-15

## 2023-11-15 MED ORDER — METHYLPREDNISOLONE SODIUM SUCC 125 MG IJ SOLR: 125.0000 mg | Freq: Once | INTRAMUSCULAR | Status: DC | PRN

## 2023-11-15 SURGICAL SUPPLY — 16 items
CATH ANGIO 5F PIGTAIL 65CM (CATHETERS) IMPLANT
CATH BEACON 5 .035 65 KMP TIP (CATHETERS) IMPLANT
CATH BEACON 5 .038 100 VERT TP (CATHETERS) IMPLANT
COVER PROBE ULTRASOUND 5X96 (MISCELLANEOUS) IMPLANT
DEVICE STARCLOSE SE CLOSURE (Vascular Products) IMPLANT
GLIDEWIRE ADV .035X260CM (WIRE) IMPLANT
GOWN STRL REUS W/ TWL LRG LVL3 (GOWN DISPOSABLE) ×1 IMPLANT
NDL ENTRY 21GA 7CM ECHOTIP (NEEDLE) IMPLANT
NEEDLE ENTRY 21GA 7CM ECHOTIP (NEEDLE) ×1 IMPLANT
PACK ANGIOGRAPHY (CUSTOM PROCEDURE TRAY) ×1 IMPLANT
SET INTRO CAPELLA COAXIAL (SET/KITS/TRAYS/PACK) IMPLANT
SHEATH ANL2 6FRX45 HC (SHEATH) IMPLANT
SHEATH BRITE TIP 5FRX11 (SHEATH) IMPLANT
SYR MEDRAD MARK 7 150ML (SYRINGE) IMPLANT
TUBING CONTRAST HIGH PRESS 72 (TUBING) IMPLANT
WIRE J 3MM .035X145CM (WIRE) IMPLANT

## 2023-11-15 NOTE — Progress Notes (Signed)
 Patient stable. Bedside handoff given to [facility] RN

## 2023-11-15 NOTE — Op Note (Signed)
 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  Left lower extremity angiography  2.  Selective injection of the left lower extremity third order catheter placement  3.  Ultrasound-guided access to the right common femoral artery  4.  StarClose right femoral artery    Anesthesia: Conscious sedation was administered by the interventional radiology RN under my direct supervision. IV Versed  plus fentanyl  were utilized. Continuous ECG, pulse oximetry and blood pressure was monitored throughout the entire procedure.  Conscious sedation was administered for a total of 53 minutes.  Sheath: 5 French 11 cm Pinnacle sheath retrograde right common femoral  Contrast: 30 cc   Fluoroscopy Time: 8.7 minutes  Indications:  The patient presents to [facility] with atherosclerotic occlusive disease bilateral lower extremities with ischemic rest pain of the left forefoot.  Pedal pulses are nonpalpable bilaterally suggesting hemodynamically significant atherosclerotic occlusive disease.  The risks and benefits as well as alternative therapies for lower extremity revascularization are reviewed with the patient all questions are answered the patient agrees to proceed.  The patient is therefore undergoing angiography with the hope for intervention for limb salvage.   Procedure:  Claudia Mills a 53 y.o. female who was identified and appropriate procedural time out was performed.  The patient was then placed supine on the table and prepped and draped in the usual sterile fashion.  Ultrasound was used to evaluate the right common femoral artery.  It was echolucent and pulsatile indicating it is patent .   An ultrasound image was acquired for the permanent record.  A micropuncture needle was used to access the right common femoral artery under direct ultrasound guidance.  The microwire was then advanced under fluoroscopic guidance without difficulty followed by the micro-sheath.  A 0.035 J wire was advanced without resistance and a 5Fr sheath was placed.    Pigtail catheter was then advanced distal aorta AP projection of the aorta was obtained. Stiff angled Glidewire and pigtail catheter was then used across the bifurcation and the catheter was positioned in the distal external iliac artery.  LAO of the left groin was then obtained. Wire was reintroduced and negotiated into the mid profunda femoris and the catheter was advanced into the mid profunda femoris. Distal runoff was then performed.  After review of the images the catheter was removed over wire and an RAO view of the groin was obtained. StarClose device was deployed without difficulty.   Findings:   Left lower Extremity: The left external iliac artery was widely patent.  Previously placed stents are intact without evidence of any restenosis.  The left common femoral and profunda femoris are widely patent.  The superficial femoral and popliteal arteries demonstrate occlusion of the previously placed stents.  There is reconstitution of the at knee popliteal.  The below-knee popliteal appears acceptable as a distal target.  Trifurcation is profoundly diseased with occlusion of the anterior tibial and its first 1 to 2 cm.  It does reconstitute but then does not fill distally.  The tibioperoneal trunk demonstrates diffuse hemodynamically significant disease.  The mid peroneal is reconstituted and appears of normal caliber and is widely patent down to the ankle where a very large collateral fills the plantar arteries which then fills the pedal arch.  Posterior tibial  is occluded throughout its entirety  SUMMARY: Based on these images no intervention is  performed at this time.  The patient has an adequate distal target for bypass and given the recurrence of stent thrombosis I do not believe further intervention is warranted at this time.  I would move forward with bypass surgery.  Likely this will require composite prosthetic with vein.    Disposition: Patient was taken to the recovery room in stable condition having tolerated the procedure well.  Claudia Mills 11/15/2023,1:59 PM

## 2023-11-15 NOTE — Progress Notes (Signed)
 PHARMACY - ANTICOAGULATION CONSULT NOTE  Pharmacy Consult for heparin  infusion Indication: Critical limb ischemia   Allergies  Allergen Reactions   Bee Venom Anaphylaxis    Patient Measurements: Height: 5' (152.4 cm) Weight: 58.3 kg (128 lb 9.6 oz) IBW/kg (Calculated) : 45.5 HEPARIN  DW (KG): 57.3  Vital Signs: Temp: 98.1 F (36.7 C) (10/01 0434) Temp Source: Oral (09/30 2036) BP: 129/60 (10/01 0434) Pulse Rate: 102 (10/01 0434)  Labs: Recent Labs    11/13/23 0430 11/14/23 0338 11/15/23 0401  HGB 12.7  --  12.5  HCT 38.1  --  37.1  PLT 204  --  227  HEPARINUNFRC 0.60 0.55 0.52    Estimated Creatinine Clearance: 65 mL/min (by C-G formula based on SCr of 0.5 mg/dL).   Medical History: Past Medical History:  Diagnosis Date   Coronary artery disease    Diabetes mellitus without complication (HCC)    Hypertension    MI (myocardial infarction) (HCC)    PVD (peripheral vascular disease)    Tick bite    Pt states she does not remember daye of tic bite, but that is was since last visit with pain clinic and she was hospitalized for 1 day.    Medications:  Scheduled:   aspirin  EC  81 mg Oral Daily   atorvastatin   20 mg Oral Daily   Chlorhexidine  Gluconate Cloth  6 each Topical Daily   docusate sodium  100 mg Oral BID   DULoxetine   30 mg Oral Daily   gabapentin   300 mg Oral TID   insulin  aspart  0-15 Units Subcutaneous TID WC   insulin  aspart  0-5 Units Subcutaneous QHS   lisinopril   20 mg Oral Daily   pantoprazole   40 mg Oral Daily   polyethylene glycol  17 g Oral Daily   sodium chloride  flush  3 mL Intravenous Q12H   Infusions:   heparin  1,100 Units/hr (11/15/23 0032)   PRN: acetaminophen , magnesium  hydroxide, morphine  injection, nitroGLYCERIN , oxyCODONE , polyethylene glycol, sodium chloride  flush  Assessment: Patient presents to [facility] for treatment of her left lower extremity ischemia. On 11/06/23, she presented to [facility]  and was evaluated in the emergency room and noted to have changes of the left forefoot consistent with ischemia. Pharmacy has been consulted to initiate and monitor continuous heparin  infusion. No chronic anticoagulation prior to arrival  Goal of Therapy:  anti-Xa level  0.3-0.7 units/ml Monitor platelets by anticoagulation protocol: Yes  Monitoring: 0928 @ 1536: HL 0.64 Therapeutic x 1 (1100 units/hr) 0928 @ 2124: HL 0.58           Therapeutic X 2  0929 @ 0430: HL 0.60           Therapeutic X 3 0930 0338 HL 0.55  Therapeutic x 4 1001 0401 HL 0.52  Therapeutic x 5   Plan: ---Continue heparin  infusion at 1100 units/hr ---Recheck HL on 10/02 with AM labs  ---Continue to monitor H&H and platelets  Rankin CANDIE Dills, PharmD, [facility] 11/15/2023 5:18 AM

## 2023-11-16 ENCOUNTER — Encounter
Admission: AD | Disposition: A | Discharge status: Skilled Nursing Facility | Payer: Self-pay | Source: Home / Self Care | Attending: Vascular Surgery

## 2023-11-16 ENCOUNTER — Telehealth (INDEPENDENT_AMBULATORY_CARE_PROVIDER_SITE_OTHER): Payer: Self-pay | Admitting: Vascular Surgery

## 2023-11-16 ENCOUNTER — Inpatient Hospital Stay: Admitting: Anesthesiology

## 2023-11-16 ENCOUNTER — Inpatient Hospital Stay

## 2023-11-16 ENCOUNTER — Other Ambulatory Visit: Payer: Self-pay

## 2023-11-16 ENCOUNTER — Encounter: Payer: Self-pay | Admitting: Vascular Surgery

## 2023-11-16 DIAGNOSIS — I2510 Atherosclerotic heart disease of native coronary artery without angina pectoris: Secondary | ICD-10-CM | POA: Diagnosis not present

## 2023-11-16 DIAGNOSIS — I70202 Unspecified atherosclerosis of native arteries of extremities, left leg: Secondary | ICD-10-CM | POA: Diagnosis not present

## 2023-11-16 DIAGNOSIS — I70222 Atherosclerosis of native arteries of extremities with rest pain, left leg: Secondary | ICD-10-CM | POA: Diagnosis not present

## 2023-11-16 DIAGNOSIS — I10 Essential (primary) hypertension: Secondary | ICD-10-CM | POA: Diagnosis not present

## 2023-11-16 HISTORY — PX: BYPASS GRAFT POPLITEAL TO POPLITEAL: SHX5763

## 2023-11-16 HISTORY — PX: ENDARTERECTOMY POPLITEAL: SHX5806

## 2023-11-16 HISTORY — PX: ENDARTERECTOMY TIBIOPERONEAL: CATH118350

## 2023-11-16 HISTORY — PX: BYPASS GRAFT POPLITEAL TO TIBIAL: CATH118344

## 2023-11-16 HISTORY — PX: ENDARTERECTOMY FEMORAL: SHX5804

## 2023-11-16 HISTORY — PX: APPLICATION OF CELL SAVER: SHX7529

## 2023-11-16 LAB — GLUCOSE, CAPILLARY
Glucose-Capillary: 206 mg/dL — ABNORMAL HIGH (ref 70–99)
Glucose-Capillary: 198 mg/dL — ABNORMAL HIGH (ref 70–99)
Glucose-Capillary: 306 mg/dL — ABNORMAL HIGH (ref 70–99)
Glucose-Capillary: 303 mg/dL — ABNORMAL HIGH (ref 70–99)
Glucose-Capillary: 265 mg/dL — ABNORMAL HIGH (ref 70–99)
Glucose-Capillary: 168 mg/dL — ABNORMAL HIGH (ref 70–99)

## 2023-11-16 LAB — CBC
HCT: 36.5 % (ref 36.0–46.0)
Hemoglobin: 12.1 g/dL (ref 12.0–15.0)
MCH: 33.7 pg (ref 26.0–34.0)
MCHC: 33.2 g/dL (ref 30.0–36.0)
MCV: 101.7 fL — ABNORMAL HIGH (ref 80.0–100.0)
Platelets: 234 K/uL (ref 150–400)
RBC: 3.59 MIL/uL — ABNORMAL LOW (ref 3.87–5.11)
RDW: 13.6 % (ref 11.5–15.5)
WBC: 8.1 K/uL (ref 4.0–10.5)
nRBC: 0 % (ref 0.0–0.2)

## 2023-11-16 LAB — HEPARIN LEVEL (UNFRACTIONATED): Heparin Unfractionated: 0.53 [IU]/mL (ref 0.30–0.70)

## 2023-11-16 LAB — BASIC METABOLIC PANEL WITH GFR
Anion gap: 10 (ref 5–15)
BUN: <5 mg/dL — ABNORMAL LOW (ref 6–20)
CO2: 27 mmol/L (ref 22–32)
Calcium: 8.8 mg/dL — ABNORMAL LOW (ref 8.9–10.3)
Chloride: 101 mmol/L (ref 98–111)
Creatinine, Ser: 0.51 mg/dL (ref 0.44–1.00)
GFR, Estimated: >60 mL/min (ref >60)
Glucose, Bld: 195 mg/dL — ABNORMAL HIGH (ref 70–99)
Potassium: 3.6 mmol/L (ref 3.5–5.1)
Sodium: 138 mmol/L (ref 135–145)

## 2023-11-16 SURGERY — BYPASS GRAFT POPLITEAL TO TIBIAL (CATHLAB)
Anesthesia: General

## 2023-11-16 MED ORDER — PAPAVERINE HCL 30 MG/ML IJ SOLN
INTRAMUSCULAR | Status: DC | PRN
  Administered 2023-11-16: 60 mg via INTRAVENOUS

## 2023-11-16 MED ORDER — ENOXAPARIN SODIUM 40 MG/0.4ML IJ SOSY
40.0000 mg | PREFILLED_SYRINGE | INTRAMUSCULAR | Status: DC
  Administered 2023-11-17 – 2023-11-23 (×7): 40 mg via SUBCUTANEOUS
  Filled 2023-11-16 (×7): qty 0.4

## 2023-11-16 MED ORDER — FENTANYL CITRATE (PF) 100 MCG/2ML IJ SOLN
25.0000 ug | INTRAMUSCULAR | Status: DC | PRN
  Administered 2023-11-16: 25 ug via INTRAVENOUS

## 2023-11-16 MED ORDER — PHENYLEPHRINE 80 MCG/ML (10ML) SYRINGE FOR IV PUSH (FOR BLOOD PRESSURE SUPPORT)
PREFILLED_SYRINGE | INTRAVENOUS | Status: DC | PRN
  Administered 2023-11-16 (×3): 80 ug via INTRAVENOUS
  Administered 2023-11-16 (×2): 160 ug via INTRAVENOUS

## 2023-11-16 MED ORDER — DEXAMETHASONE SODIUM PHOSPHATE 10 MG/ML IJ SOLN
INTRAMUSCULAR | Status: AC
  Filled 2023-11-16: qty 1

## 2023-11-16 MED ORDER — CEFAZOLIN SODIUM-DEXTROSE 2-4 GM/100ML-% IV SOLN
2.0000 g | Freq: Three times a day (TID) | INTRAVENOUS | Status: AC
  Administered 2023-11-16 – 2023-11-17 (×2): 2 g via INTRAVENOUS
  Filled 2023-11-16 (×2): qty 100

## 2023-11-16 MED ORDER — PHENYLEPHRINE HCL-NACL 20-0.9 MG/250ML-% IV SOLN
INTRAVENOUS | Status: AC
  Filled 2023-11-16: qty 250

## 2023-11-16 MED ORDER — LIDOCAINE HCL (CARDIAC) PF 100 MG/5ML IV SOSY
PREFILLED_SYRINGE | INTRAVENOUS | Status: DC | PRN
  Administered 2023-11-16: 60 mg via INTRAVENOUS

## 2023-11-16 MED ORDER — DEXAMETHASONE SODIUM PHOSPHATE 10 MG/ML IJ SOLN
INTRAMUSCULAR | Status: DC | PRN
  Administered 2023-11-16: 5 mg via INTRAVENOUS

## 2023-11-16 MED ORDER — VANCOMYCIN HCL 1000 MG IV SOLR
INTRAVENOUS | Status: DC | PRN
  Administered 2023-11-16: 1000 mg

## 2023-11-16 MED ORDER — ROCURONIUM BROMIDE 10 MG/ML (PF) SYRINGE
PREFILLED_SYRINGE | INTRAVENOUS | Status: AC
Start: 2023-11-16 — End: 2023-11-16
  Filled 2023-11-16: qty 10

## 2023-11-16 MED ORDER — MIDAZOLAM HCL 2 MG/2ML IJ SOLN
INTRAMUSCULAR | Status: AC
  Filled 2023-11-16: qty 2

## 2023-11-16 MED ORDER — NITROGLYCERIN IN D5W 200-5 MCG/ML-% IV SOLN: 5.0000 ug/min | INTRAVENOUS | Status: DC

## 2023-11-16 MED ORDER — ONDANSETRON HCL 4 MG/2ML IJ SOLN
4.0000 mg | Freq: Four times a day (QID) | INTRAMUSCULAR | Status: DC | PRN
  Administered 2023-11-16: 4 mg via INTRAVENOUS
  Filled 2023-11-16: qty 2

## 2023-11-16 MED ORDER — BUPIVACAINE HCL (PF) 0.5 % IJ SOLN
INTRAMUSCULAR | Status: AC
  Filled 2023-11-16: qty 30

## 2023-11-16 MED ORDER — OXYCODONE HCL 5 MG PO TABS
5.0000 mg | ORAL_TABLET | ORAL | Status: DC | PRN
  Administered 2023-11-16: 5 mg via ORAL
  Administered 2023-11-17 (×3): 10 mg via ORAL
  Administered 2023-11-18: 5 mg via ORAL
  Administered 2023-11-18 – 2023-11-23 (×17): 10 mg via ORAL
  Filled 2023-11-16 (×12): qty 2
  Filled 2023-11-16: qty 1
  Filled 2023-11-16 (×8): qty 2
  Filled 2023-11-16: qty 1

## 2023-11-16 MED ORDER — INSULIN ASPART 100 UNIT/ML IJ SOLN
10.0000 [IU] | Freq: Once | INTRAMUSCULAR | Status: AC
  Administered 2023-11-16: 10 [IU] via SUBCUTANEOUS

## 2023-11-16 MED ORDER — SODIUM CHLORIDE 0.9 % IV SOLN: INTRAVENOUS | Status: AC

## 2023-11-16 MED ORDER — SEVOFLURANE IN SOLN
RESPIRATORY_TRACT | Status: AC
  Filled 2023-11-16: qty 250

## 2023-11-16 MED ORDER — MIDAZOLAM HCL 2 MG/2ML IJ SOLN
INTRAMUSCULAR | Status: DC | PRN
  Administered 2023-11-16: 2 mg via INTRAVENOUS

## 2023-11-16 MED ORDER — PHENYLEPHRINE 80 MCG/ML (10ML) SYRINGE FOR IV PUSH (FOR BLOOD PRESSURE SUPPORT)
PREFILLED_SYRINGE | INTRAVENOUS | Status: AC
  Filled 2023-11-16: qty 10

## 2023-11-16 MED ORDER — LABETALOL HCL 5 MG/ML IV SOLN
INTRAVENOUS | Status: AC
  Filled 2023-11-16: qty 4

## 2023-11-16 MED ORDER — SUGAMMADEX SODIUM 200 MG/2ML IV SOLN
INTRAVENOUS | Status: DC | PRN
  Administered 2023-11-16: 120 mg via INTRAVENOUS

## 2023-11-16 MED ORDER — INSULIN ASPART 100 UNIT/ML IJ SOLN
INTRAMUSCULAR | Status: AC
Start: 2023-11-16 — End: 2023-11-16
  Filled 2023-11-16: qty 1

## 2023-11-16 MED ORDER — HYDROMORPHONE HCL 1 MG/ML IJ SOLN
0.5000 mg | INTRAMUSCULAR | Status: DC | PRN
  Administered 2023-11-16: 1 mg via INTRAVENOUS
  Administered 2023-11-17 (×3): 0.5 mg via INTRAVENOUS
  Administered 2023-11-17 – 2023-11-23 (×6): 1 mg via INTRAVENOUS
  Filled 2023-11-16 (×12): qty 1

## 2023-11-16 MED ORDER — ACETAMINOPHEN 10 MG/ML IV SOLN
INTRAVENOUS | Status: AC
  Filled 2023-11-16: qty 100

## 2023-11-16 MED ORDER — ONDANSETRON HCL 4 MG/2ML IJ SOLN
INTRAMUSCULAR | Status: AC
  Filled 2023-11-16: qty 2

## 2023-11-16 MED ORDER — ROCURONIUM BROMIDE 100 MG/10ML IV SOLN
INTRAVENOUS | Status: DC | PRN
  Administered 2023-11-16 (×2): 20 mg via INTRAVENOUS
  Administered 2023-11-16: 70 mg via INTRAVENOUS
  Administered 2023-11-16 (×2): 20 mg via INTRAVENOUS
  Administered 2023-11-16: 10 mg via INTRAVENOUS

## 2023-11-16 MED ORDER — LIDOCAINE HCL (PF) 2 % IJ SOLN
INTRAMUSCULAR | Status: AC
  Filled 2023-11-16: qty 5

## 2023-11-16 MED ORDER — GENTAMICIN SULFATE 40 MG/ML IJ SOLN
INTRAMUSCULAR | Status: AC
  Filled 2023-11-16: qty 2

## 2023-11-16 MED ORDER — DOCUSATE SODIUM 100 MG PO CAPS: 100.0000 mg | ORAL_CAPSULE | Freq: Every day | ORAL | Status: DC

## 2023-11-16 MED ORDER — PROPOFOL 10 MG/ML IV BOLUS
INTRAVENOUS | Status: AC
  Filled 2023-11-16: qty 20

## 2023-11-16 MED ORDER — PHENOL 1.4 % MT LIQD: 1.0000 | OROMUCOSAL | Status: DC | PRN

## 2023-11-16 MED ORDER — IOHEXOL 180 MG/ML  SOLN
INTRAMUSCULAR | Status: DC | PRN
  Administered 2023-11-16: 40 mL via INTRAVENOUS

## 2023-11-16 MED ORDER — POTASSIUM CHLORIDE CRYS ER 20 MEQ PO TBCR: 40.0000 meq | EXTENDED_RELEASE_TABLET | Freq: Every day | ORAL | Status: DC | PRN

## 2023-11-16 MED ORDER — FENTANYL CITRATE (PF) 100 MCG/2ML IJ SOLN
INTRAMUSCULAR | Status: AC
  Filled 2023-11-16: qty 2

## 2023-11-16 MED ORDER — GENTAMICIN SULFATE 40 MG/ML IJ SOLN
INTRAMUSCULAR | Status: DC | PRN
  Administered 2023-11-16: 80 mg via INTRAMUSCULAR

## 2023-11-16 MED ORDER — SORBITOL 70 % SOLN: 30.0000 mL | Freq: Every day | Status: DC | PRN

## 2023-11-16 MED ORDER — HYDROMORPHONE HCL 1 MG/ML IJ SOLN
INTRAMUSCULAR | Status: DC | PRN
  Administered 2023-11-16 (×2): .5 mg via INTRAVENOUS

## 2023-11-16 MED ORDER — ONDANSETRON HCL 4 MG/2ML IJ SOLN
4.0000 mg | Freq: Once | INTRAMUSCULAR | Status: AC | PRN
  Administered 2023-11-16: 4 mg via INTRAVENOUS

## 2023-11-16 MED ORDER — SODIUM CHLORIDE 0.9 % IV SOLN
INTRAVENOUS | Status: DC | PRN
  Administered 2023-11-16: 250 mL

## 2023-11-16 MED ORDER — ACETAMINOPHEN 650 MG RE SUPP: 325.0000 mg | RECTAL | Status: DC | PRN

## 2023-11-16 MED ORDER — PAPAVERINE HCL 30 MG/ML IJ SOLN
INTRAMUSCULAR | Status: AC
  Filled 2023-11-16: qty 2

## 2023-11-16 MED ORDER — SODIUM CHLORIDE (PF) 0.9 % IJ SOLN
INTRAMUSCULAR | Status: AC
  Filled 2023-11-16: qty 50

## 2023-11-16 MED ORDER — HEMOSTATIC AGENTS (NO CHARGE) OPTIME
TOPICAL | Status: DC | PRN
Start: 2023-11-16 — End: 2023-11-16
  Administered 2023-11-16: 3 via TOPICAL

## 2023-11-16 MED ORDER — CEFAZOLIN SODIUM-DEXTROSE 2-4 GM/100ML-% IV SOLN
2.0000 g | Freq: Three times a day (TID) | INTRAVENOUS | Status: DC
  Filled 2023-11-16 (×2): qty 100

## 2023-11-16 MED ORDER — HEPARIN SODIUM (PORCINE) 1000 UNIT/ML IJ SOLN
INTRAMUSCULAR | Status: DC | PRN
  Administered 2023-11-16: 6000 [IU] via INTRAVENOUS
  Administered 2023-11-16 (×3): 2000 [IU] via INTRAVENOUS

## 2023-11-16 MED ORDER — HEPARIN SODIUM (PORCINE) 1000 UNIT/ML IJ SOLN
INTRAMUSCULAR | Status: AC
Start: 2023-11-16 — End: 2023-11-16
  Filled 2023-11-16: qty 10

## 2023-11-16 MED ORDER — SENNOSIDES-DOCUSATE SODIUM 8.6-50 MG PO TABS: 1.0000 | ORAL_TABLET | Freq: Every evening | ORAL | Status: DC | PRN

## 2023-11-16 MED ORDER — ACETAMINOPHEN 325 MG PO TABS
325.0000 mg | ORAL_TABLET | ORAL | Status: DC | PRN
  Administered 2023-11-18 – 2023-11-23 (×2): 650 mg via ORAL
  Filled 2023-11-16 (×2): qty 2

## 2023-11-16 MED ORDER — KETAMINE HCL 50 MG/5ML IJ SOSY
PREFILLED_SYRINGE | INTRAMUSCULAR | Status: AC
  Filled 2023-11-16: qty 5

## 2023-11-16 MED ORDER — PHENYLEPHRINE HCL-NACL 20-0.9 MG/250ML-% IV SOLN
INTRAVENOUS | Status: DC | PRN
  Administered 2023-11-16: 80 ug/min via INTRAVENOUS

## 2023-11-16 MED ORDER — METOPROLOL TARTRATE 5 MG/5ML IV SOLN: 2.5000 mg | INTRAVENOUS | Status: DC | PRN

## 2023-11-16 MED ORDER — HEPARIN 30,000 UNITS/1000 ML (OHS) CELLSAVER SOLUTION
Status: AC | PRN
  Administered 2023-11-16: 1

## 2023-11-16 MED ORDER — HEPARIN SODIUM (PORCINE) 5000 UNIT/ML IJ SOLN
INTRAMUSCULAR | Status: AC
  Filled 2023-11-16: qty 1

## 2023-11-16 MED ORDER — FENTANYL CITRATE (PF) 100 MCG/2ML IJ SOLN
INTRAMUSCULAR | Status: DC | PRN
  Administered 2023-11-16 (×2): 50 ug via INTRAVENOUS

## 2023-11-16 MED ORDER — CLOPIDOGREL BISULFATE 75 MG PO TABS
75.0000 mg | ORAL_TABLET | Freq: Every day | ORAL | Status: DC
  Administered 2023-11-17 – 2023-11-23 (×7): 75 mg via ORAL
  Filled 2023-11-16 (×7): qty 1

## 2023-11-16 MED ORDER — PROPOFOL 10 MG/ML IV BOLUS
INTRAVENOUS | Status: DC | PRN
  Administered 2023-11-16: 90 mg via INTRAVENOUS

## 2023-11-16 MED ORDER — LABETALOL HCL 5 MG/ML IV SOLN
10.0000 mg | INTRAVENOUS | Status: DC | PRN
  Administered 2023-11-16 (×2): 10 mg via INTRAVENOUS
  Filled 2023-11-16 (×2): qty 4

## 2023-11-16 MED ORDER — HYDRALAZINE HCL 20 MG/ML IJ SOLN: 5.0000 mg | INTRAMUSCULAR | Status: DC | PRN

## 2023-11-16 MED ORDER — KETAMINE HCL 50 MG/5ML IJ SOSY
PREFILLED_SYRINGE | INTRAMUSCULAR | Status: DC | PRN
Start: 2023-11-16 — End: 2023-11-16
  Administered 2023-11-16 (×3): 10 mg via INTRAVENOUS

## 2023-11-16 MED ORDER — ACETAMINOPHEN 10 MG/ML IV SOLN
INTRAVENOUS | Status: DC | PRN
  Administered 2023-11-16: 1000 mg via INTRAVENOUS

## 2023-11-16 MED ORDER — ROCURONIUM BROMIDE 10 MG/ML (PF) SYRINGE
PREFILLED_SYRINGE | INTRAVENOUS | Status: AC
  Filled 2023-11-16: qty 10

## 2023-11-16 MED ORDER — SODIUM CHLORIDE 0.9 % IV SOLN: 500.0000 mL | Freq: Once | INTRAVENOUS | Status: DC | PRN

## 2023-11-16 MED ORDER — ONDANSETRON HCL 4 MG/2ML IJ SOLN
INTRAMUSCULAR | Status: DC | PRN
  Administered 2023-11-16: 4 mg via INTRAVENOUS

## 2023-11-16 MED ORDER — VANCOMYCIN HCL 1000 MG IV SOLR
INTRAVENOUS | Status: AC
  Filled 2023-11-16: qty 20

## 2023-11-16 MED ORDER — HYDROMORPHONE HCL 1 MG/ML IJ SOLN
INTRAMUSCULAR | Status: AC
  Filled 2023-11-16: qty 1

## 2023-11-16 SURGICAL SUPPLY — 78 items
BAG DECANTER FOR FLEXI CONT (MISCELLANEOUS) ×3 IMPLANT
BAG ISOLATATION DRAPE 20X20 ST (DRAPES) ×3 IMPLANT
BLADE SURG 15 STRL LF DISP TIS (BLADE) ×3 IMPLANT
BLADE SURG SZ11 CARB STEEL (BLADE) ×3 IMPLANT
BRUSH SCRUB EZ 4% CHG (MISCELLANEOUS) ×4 IMPLANT
CATH EMB LF 3FRX80 (CATHETERS) ×1 IMPLANT
CHLORAPREP W/TINT 26 (MISCELLANEOUS) ×3 IMPLANT
CLAMP SUTURE YELLOW 5 PAIRS (MISCELLANEOUS) ×3 IMPLANT
CLEANSER WND VASHE 34 (WOUND CARE) ×1 IMPLANT
CLIP APPLIE 11 MED OPEN (CLIP) IMPLANT
CLIP APPLIE 9.375 SM OPEN (CLIP) IMPLANT
CNTNR URN SCR LID CUP LEK RST (MISCELLANEOUS) ×1 IMPLANT
COUNTER NDL MAGNETIC 40 RED (SET/KITS/TRAYS/PACK) IMPLANT
COUNTER NEEDLE MAGNETIC 40 RED (SET/KITS/TRAYS/PACK) ×3 IMPLANT
COVER PROBE FLX POLY STRL (MISCELLANEOUS) ×1 IMPLANT
DRAPE C-ARM XRAY 36X54 (DRAPES) ×1 IMPLANT
DRAPE INCISE IOBAN 66X45 STRL (DRAPES) ×6 IMPLANT
DRAPE SHEET LG 3/4 BI-LAMINATE (DRAPES) ×3 IMPLANT
DRAPE SURG ORHT 6 SPLT 77X108 (DRAPES) ×1 IMPLANT
DRESSING SURGICEL FIBRLLR 1X2 (HEMOSTASIS) ×4 IMPLANT
DRSG OPSITE POSTOP 4X10 (GAUZE/BANDAGES/DRESSINGS) IMPLANT
DRSG OPSITE POSTOP 4X6 (GAUZE/BANDAGES/DRESSINGS) ×3 IMPLANT
DRSG OPSITE POSTOP 4X8 (GAUZE/BANDAGES/DRESSINGS) ×2 IMPLANT
ELECT CAUTERY BLADE 6.4 (BLADE) ×3 IMPLANT
ELECTRODE REM PT RTRN 9FT ADLT (ELECTROSURGICAL) ×3 IMPLANT
GAUZE 4X4 16PLY ~~LOC~~+RFID DBL (SPONGE) IMPLANT
GEL ULTRASOUND 20GR AQUASONIC (MISCELLANEOUS) IMPLANT
GLOVE BIO SURGEON STRL SZ7 (GLOVE) ×3 IMPLANT
GLOVE SURG SYN 8.0 PF PI (GLOVE) ×3 IMPLANT
GOWN STRL REUS W/ TWL LRG LVL3 (GOWN DISPOSABLE) ×12 IMPLANT
GOWN STRL REUS W/ TWL XL LVL3 (GOWN DISPOSABLE) ×6 IMPLANT
GOWN STRL REUS W/TWL 2XL LVL3 (GOWN DISPOSABLE) ×4 IMPLANT
GRAFT PROPATEN W/RING 6X80X60 (Vascular Products) ×1 IMPLANT
Gore Tex suture CV-7 IMPLANT
GoreTex Suture CV-6 13mm IMPLANT
HEAD CUTTING 'VALVULOTOME URSL (MISCELLANEOUS) IMPLANT
HEMOSTAT HEMOBLAST BELLOWS (HEMOSTASIS) ×1 IMPLANT
IV NS 500ML BAXH (IV SOLUTION) ×3 IMPLANT
KIT STIMULAN RAPID CURE 5CC (Orthopedic Implant) ×1 IMPLANT
KIT TURNOVER KIT A (KITS) ×3 IMPLANT
LABEL OR SOLS (LABEL) ×2 IMPLANT
LOOP VESSEL MAXI 1X406 RED (MISCELLANEOUS) ×9 IMPLANT
LOOP VESSEL MINI 0.8X406 BLUE (MISCELLANEOUS) ×6 IMPLANT
MANIFOLD NEPTUNE II (INSTRUMENTS) ×3 IMPLANT
NDL FILTER BLUNT 18X1 1/2 (NEEDLE) ×2 IMPLANT
NEEDLE FILTER BLUNT 18X1 1/2 (NEEDLE) ×6 IMPLANT
PACK BASIN MAJOR ARMC (MISCELLANEOUS) ×3 IMPLANT
PACK UNIVERSAL (MISCELLANEOUS) ×3 IMPLANT
PAD PREP OB/GYN DISP 24X41 (PERSONAL CARE ITEMS) ×3 IMPLANT
PENCIL SMOKE EVACUATOR (MISCELLANEOUS) ×3 IMPLANT
SET WALTER ACTIVATION W/DRAPE (SET/KITS/TRAYS/PACK) ×3 IMPLANT
SOLN 0.9% NACL 1000 ML (IV SOLUTION) ×3 IMPLANT
SOLN 0.9% NACL POUR BTL 1000ML (IV SOLUTION) ×3 IMPLANT
SPONGE T-LAP 18X18 ~~LOC~~+RFID (SPONGE) IMPLANT
STAPLER SKIN PROX 35W (STAPLE) ×4 IMPLANT
SUT ETHIBOND CT1 BRD #0 30IN (SUTURE) IMPLANT
SUT GORETEX 6.0 TT9 (SUTURE) ×5 IMPLANT
SUT PROLENE 3 0 SH DA (SUTURE) IMPLANT
SUT PROLENE 5 0 RB 1 DA (SUTURE) ×11 IMPLANT
SUT PROLENE 6 0 BV (SUTURE) ×27 IMPLANT
SUT PROLENE 7 0 BV 1 (SUTURE) ×12 IMPLANT
SUT SILK 2 0 SH (SUTURE) ×3 IMPLANT
SUT SILK 2-0 18XBRD TIE 12 (SUTURE) ×4 IMPLANT
SUT SILK 3-0 18XBRD TIE 12 (SUTURE) ×4 IMPLANT
SUT VIC AB 2-0 CT1 (SUTURE) ×6 IMPLANT
SUT VIC AB 3-0 SH 27X BRD (SUTURE) ×5 IMPLANT
SUT VICRYL+ 3-0 36IN CT-1 (SUTURE) ×6 IMPLANT
SUTURE EHLN 3-0 FS-10 30 BLK (SUTURE) ×3 IMPLANT
SUTURE GTX CV-6 30 TTC13 3/8CR (SUTURE) ×3 IMPLANT
SUTURE GTX CV-7 30 TTC13 3/8CR (SUTURE) ×1 IMPLANT
SYR 20ML LL LF (SYRINGE) ×1 IMPLANT
SYR 3ML LL SCALE MARK (SYRINGE) ×3 IMPLANT
TAPE UMBILICAL 1/8 X36 TWILL (MISCELLANEOUS) ×2 IMPLANT
TOWEL OR 17X26 4PK STRL BLUE (TOWEL DISPOSABLE) ×3 IMPLANT
TRAP FLUID SMOKE EVACUATOR (MISCELLANEOUS) ×3 IMPLANT
TRAY FOLEY MTR SLVR 16FR STAT (SET/KITS/TRAYS/PACK) ×3 IMPLANT
VALVULOTOME HEAD CUTTING URSL (MISCELLANEOUS) IMPLANT
WATER STERILE IRR 500ML POUR (IV SOLUTION) ×3 IMPLANT

## 2023-11-16 NOTE — Anesthesia Procedure Notes (Signed)
 Procedure Name: Intubation Date/Time: 11/16/2023 7:44 AM  Performed by: Jackye Spanner, CRNAPre-anesthesia Checklist: Patient identified, Patient being monitored, Timeout performed, Emergency Drugs available and Suction available Patient Re-evaluated:Patient Re-evaluated prior to induction Oxygen Delivery Method: Circle system utilized Preoxygenation: Pre-oxygenation with 100% oxygen Induction Type: IV induction Ventilation: Mask ventilation without difficulty Laryngoscope Size: 3 and McGrath Grade View: Grade I Tube type: Oral Tube size: 7.0 mm Number of attempts: 1 Airway Equipment and Method: Stylet Placement Confirmation: ETT inserted through vocal cords under direct vision, positive ETCO2 and breath sounds checked- equal and bilateral Secured at: 19 cm Tube secured with: Tape Dental Injury: Teeth and Oropharynx as per pre-operative assessment  Comments: Smooth atraumatic intubation, no complications noted.

## 2023-11-16 NOTE — Plan of Care (Signed)

## 2023-11-16 NOTE — Telephone Encounter (Signed)
 I spoke by phone with the patient's son.  I let him know that her surgery went well and then she will be going to the ICU.

## 2023-11-16 NOTE — Op Note (Signed)
 [facility]   OPERATIVE NOTE    PRE-OPERATIVE DIAGNOSIS: Recurrent left lower extremity ischemia with rest pain  POST-OPERATIVE DIAGNOSIS: Same as above  PROCEDURE: Redo femoral surgery  Left profunda femorus to above-knee popliteal bypass with PTFE Left popliteal to anterior tibial bypass with reverse saphenous vein graft Left profunda femoris artery endarterectomy Left popliteal artery endarterectomy Left lower extremity angiogram Exploration of left posterior tibial artery and posterior tibial artery endarterectomy but vessel was still not suitable for bypass Placement of antibiotic impregnated beads throughout multiple wounds of the lower extremities  SURGEONS: Selinda Gu, MD and [name], MD  ANESTHESIA: General  ESTIMATED BLOOD LOSS: 500 cc  FINDING(S): Significant plaque in the popliteal artery requiring endarterectomy.  A previous flap with plaque in the left profunda femoris artery requiring endarterectomy Left posterior tibial artery was heavily diseased and endarterectomy was performed but this was not suitable for bypass Left peroneal artery was extremely small but had a pulse after the femoral to popliteal bypass Left anterior tibial artery appeared suitable for bypass  SPECIMEN(S): Left profunda femoris artery plaque.  Left popliteal artery plaque.  Left posterior tibial artery plaque  INDICATIONS:   Claudia Mills is a 53 y.o. female who presents with recurrent rest pain of the left lower extremity after previous hybrid procedure and angiogram showed her SFA/popliteal stents were occluded.  There was also disease in the tibioperoneal trunk and proximal peroneal artery and the plan was for a femoral to tibial bypass. The risk, benefits, and alternative for bypass operations were discussed with the patient.  The patient is aware the risks include but are not limited to: bleeding, infection, myocardial infarction, stroke, limb loss, nerve  damage, need for additional procedures in the future, wound complications, and inability to complete the bypass.  The patient is aware of these risks and agreed to proceed.  DESCRIPTION: After full informed written consent was obtained, the patient was brought back to the operating room and placed supine upon the operating table.  Prior to induction, the patient was given intravenous antibiotics.  After obtaining adequate anesthesia, the patient was prepped and draped in the standard fashion for a femoral to tibial bypass operation.  Attention was turned to the left groin.  A longitudinal incision was made over the left common femoral artery.  Using blunt dissection and electrocautery, the artery was dissected out from the inguinal ligament down to the femoral bifurcation.  The superficial femoral artery, profunda femoral artery, and external iliac artery were dissected out and vessel loops applied.  Circumflex branches were also dissected and controlled with vessel loops.  This common femoral artery was patent after the previous femoral endarterectomy.  The profunda femoris artery was disease seen on the angiogram.  We elected to dissect this out and use this for our proximal bypass anastomosis and also this would allow us  to address the significant disease in the proximal profunda femoris artery.  The dissection was somewhat more tedious due to the previous surgery and care was taken to avoid injury with the increase scar tissue.  Attention was turned to the medial calf.  A longitudinal incision was created on the medial calf and we dissected out the tibioperoneal trunk, peroneal artery, and posterior tibial arteries.  The peroneal artery was extremely small.  This was planned to be our distal bypass target, but it was extremely small.  The posterior tibial artery was heavily calcified and diseased.  At this point, attention was turned to the distal thigh.  An longitudinal incision was made over Hunter's  canal.  Using blunt dissection and electrocautery, a plane was developed through the subcutaneous tissue and fascia down to Hunter's canal.  The above-the-knee popliteal artery was dissected away from its adjacent veins.  This popliteal artery was quite diseased and found on exam to be calcified.    We then dissected out the saphenous vein.  Unfortunately, the saphenous vein trifurcated just below the knee it would not be long enough for a femoral to distal bypass alone.  It was dissected out from the level of the knee up to the saphenofemoral junction.  Multiple venous side branches were ligated and divided between silk ties.  The vein would be used for bypass from the popliteal artery to the tibial vessels.  We elected to place a prosthetic graft from the left profunda femoris artery to the above-knee popliteal artery.  We selected a 6 mm diameter ringed Propaten graft for bypass.    At this point, I tunneled from the above-the-knee popliteal exposure to the left groin with a tunneler.  The PTFE conduit was sewn to the inner cannula and passed through the tunnel, taking care to maintain the orientation of the conduit.  The patient was given 6000 units of Heparin  intravenously.  The distal end of the conduit was clamped.  The common femoral artery, superficial femoral artery, and profunda femoral artery were then clamped along with any circumflex branches.  I made an arteriotomy in the proximal profunda femorus artery.  The artery was found to have plaque with a flap that was seen on the angiogram.  This plaque was then removed and sent for specimen as an endarterectomy after being removed with a freer elevator and gentle traction..     The proximal extent of the PTFE graft was cut and bevelled to match the arteriotomy.  The graft was sewn to the profunda femoral artery in an end-to-side configuration with a running stitch of CV-6 suture.  Prior to completing this anastomosis, I allowed the external iliac  artery, profunda femoral artery, and superficial femoral artery to backbleed.  I completed the anastomosis in the usual fashion.  I then turned my attention to the distal end of the graft.  I released the clamp and pulsatile bleeding from the graft was evident.  I reclamped the conduit near the arterial anastomosis.  The conduit was irrigated with heparinized saline and suctioned out.  The above-the-knee popliteal artery was then placed under tension proximally and distally with vessel loops.  An arteriotomy was made with a 11-blade and were extended proximally and distally with a Pott's scissor.  The popliteal artery was heavily calcified and a plaque was identified.  We then used a freer elevator to perform an endarterectomy in the above-knee popliteal artery at the site of the distal bypass anastomosis.  A plane was created with the elevator and the plaque was removed with gentle traction.  The distal end of the graft was then cut and bevelled to match the arteriotomy.  The conduit was sewn to the to the artery in an end-to-side configuration with a running CV-6 suture.  Prior to completing the anastomosis, I backbled both ends of the artery.  There was no visible thrombus.  The conduit was allowed to bleed in an antegrade fashion.  There was excellent pulsatile flow.  6-0 Prolene patch sutures were used as needed for hemostasis.  At this point, we prepared the above-knee popliteal artery just distal to the prosthetic bypass for anastomosis  to the saphenous vein.  The saphenous vein had been taken out with ligation proximally and distally with 2-0 silk ties.  All sidebranches were controlled with either 3-0 silk ties or 6-0 Prolene sutures.  It was flushed forward with heparinized saline with a papaverine solution to avoid vasospasm.  It was reversed and prepared for anastomosis to the above-knee popliteal artery just below the distal anastomosis of the femoral to popliteal bypass.  The vessel was controlled  with vascular clamps.  An anterior wall arteriotomy was created with an 11 blade and extended with Potts scissors.  Again, thick calcific plaque was seen in the popliteal artery and an endarterectomy was performed to ensure adequate inflow for this popliteal to tibial bypass.  This was done with a freer elevator and gentle traction.  An anastomosis was then created with a running 6-0 Prolene suture in an end to side fashion for the saphenous vein to the above-knee popliteal artery.  On release of control there was excellent pulsatile flow.  The vein was then tunneled in a subcutaneous fashion across the knee and down to the tibial incision.    We then replaced the wheat Lander retractors and expose the peroneal artery and tibioperoneal trunk as well as the posterior tibial artery.  The peroneal artery had a palpable pulse in it.  This very small artery did not bode well for distal bypass anastomosis when there was already a palpable pulse.  Dr. Jama put an Angiocath in the PTFE bypass and performed imaging of the left lower extremity.  The peroneal artery was extremely small and appeared mildly diseased.  We did not feel that doing a bypass to the peroneal artery would be prudent. We then turned our attention to the posterior tibial artery.  This was dissected out and controlled with Vesseloops proximally and distally.  This was a heavily calcified vessel.  The artery was dissected out and an anterior wall arteriotomy was created with an 11 blade and extended with Potts scissors.  There was no bleeding on opening.  We then used the [facility] and a large chunk of calcific plaque was taken up to the tibioperoneal trunk and then a gentle traction with hemostats pulled a large chunk of plaque out distally about 2 cm beyond the arteriotomy.  There was still no backbleeding.  Attempts to pass a 1.5 and 2 mm Garrett dilator distally were unsuccessful and it was clear this would not be a suitable distal bypass  anastomosis.  After the endarterectomy of the posterior tibial artery, we just closed the anterior wall primarily with 6-0 Prolene suture. At this point, we turned our attention to the anterior tibial artery.  A lateral incision was created below the tibia.  We dissected down to the anterior tibialis muscle and dissected out the anterior tibial artery.  There were multiple venous branches that were ligated and divided between silk ties to help facilitate our exposure.  The anterior tibial artery was controlled with Vesseloops. We then tunneled from the medial calf incision to the lateral calf incision through the septum below the tibia.  The vein was kept in orientation to avoid kinking or twisting.  After controlling the anterior tibial artery, an anterior wall arteriotomy was created with 11 blade and extended with Potts scissors.  The vein was cut and beveled to match the arteriotomy.  Anastomosis was then created in a running fashion with 6-0 Prolene suture.  The vessel was flushed and de-aired on release of control and on  release a single 6-0 Prolene patch suture was required.  Several venous branches were ligated and divided and hemostasis achieved in the lateral calf incision as well.  At this point, all wound were washed out with Vashe irrigation.  Gentamicin  and vancomycin  impregnated beads were then placed in the wounds.  Bleeding points were controlled with electrocautery, and suture repair of active bleeding points in the suture line.  No further bleeding was present.  Fibrillar and hemoblast topical hemostatic agents were placed and hemostasis was complete.  The above-the-knee exposure was repaired with a double layer of 2-0 Vicryl, a single layer of 3-0 Vicryl, and staples in the skin layer.  In a similar fashion, the groin was repaired with a double layer of 2-0 Vicryl, a double layer of 3-0 Vicryl, and staples in the skin layer.  The medial and lateral calf incisions as well as the saphenous vein  harvest incisions were closed with 3-0 Vicryl and staples.  All skin incisions were cleaned, dried, and a sterile dressing was applied to each incision.   COMPLICATIONS: none  CONDITION: stable   Selinda Gu 11/16/2023 2:37 PM   This note was created with Dragon Medical transcription system. Any errors in dictation are purely unintentional.

## 2023-11-16 NOTE — Anesthesia Preprocedure Evaluation (Addendum)
 Anesthesia Evaluation  Patient identified by MRN, date of birth, ID band Patient awake    Reviewed: Allergy & Precautions, NPO status , Patient's Chart, lab work & pertinent test results  Airway Mallampati: II  TM Distance: >3 FB Neck ROM: full    Dental  (+) Edentulous Upper, Edentulous Lower   Pulmonary neg pulmonary ROS, Patient abstained from smoking.   Pulmonary exam normal breath sounds clear to auscultation       Cardiovascular Exercise Tolerance: Good hypertension, + CAD, + Past MI, + Cardiac Stents and + Peripheral Vascular Disease  Normal cardiovascular exam Rhythm:Regular Rate:Normal     Neuro/Psych negative neurological ROS  negative psych ROS   GI/Hepatic negative GI ROS, Neg liver ROS,GERD  ,,  Endo/Other  diabetes, Type 2, Oral Hypoglycemic Agents    Renal/GU negative Renal ROS  negative genitourinary   Musculoskeletal   Abdominal Normal abdominal exam  (+)   Peds negative pediatric ROS (+)  Hematology negative hematology ROS (+)   Anesthesia Other Findings Past Medical History: No date: Coronary artery disease No date: Diabetes mellitus without complication (HCC) No date: Hypertension No date: MI (myocardial infarction) (HCC) No date: PVD (peripheral vascular disease) No date: Tick bite     Comment:  Pt states she does not remember daye of tic bite, but               that is was since last visit with pain clinic and she was              hospitalized for 1 day.  Past Surgical History: 11/09/2023: APPLICATION OF CELL SAVER; Left     Comment:  Procedure: APPLICATION OF CELL SAVER;  Surgeon: Jama Cordella MATSU, MD;  Location: [facility] ORS;  Service: Vascular;                Laterality: Left; No date: CORONARY ANGIOPLASTY WITH STENT PLACEMENT 11/09/2023: ENDARTERECTOMY FEMORAL; Left     Comment:  Procedure: ENDARTERECTOMY, FEMORAL;  Surgeon: Jama Cordella MATSU, MD;   Location: [facility] ORS;  Service: Vascular;                Laterality: Left; 10/06/2022: IR KYPHO LUMBAR INC FX REDUCE BONE BX UNI/BIL CANNULATION  INC/IMAGING 09/20/2022: IR RADIOLOGIST EVAL & MGMT 10/13/2022: IR RADIOLOGIST EVAL & MGMT 10/20/2022: IR RADIOLOGIST EVAL & MGMT 08/08/2023: LOWER EXTREMITY ANGIOGRAPHY; Left     Comment:  Procedure: Lower Extremity Angiography;  Surgeon:               Jama Cordella MATSU, MD;  Location: [facility] IN[facility]IVE CV [facility];               Service: Cardiovascular;  Laterality: Left; 11/08/2023: LOWER EXTREMITY ANGIOGRAPHY; Left     Comment:  Procedure: Lower Extremity Angiography;  Surgeon:               Jama Cordella MATSU, MD;  Location: [facility];               Service: Cardiovascular;  Laterality: Left; 11/15/2023: LOWER EXTREMITY ANGIOGRAPHY; Left     Comment:  Procedure: Lower Extremity Angiography;  Surgeon:               Jama Cordella MATSU, MD;  Location: [facility];  Service: Cardiovascular;  Laterality: Left; 08/08/2023: LOWER EXTREMITY INTERVENTION; Left     Comment:  Procedure: LOWER EXTREMITY INTERVENTION;  Surgeon:               Jama Cordella MATSU, MD;  Location: [facility];               Service: Cardiovascular;  Laterality: Left; 11/09/2023: THROMBECTOMY FEMORAL ARTERY; Left     Comment:  Procedure: THROMBECTOMY, ARTERY, FEMORAL and Popliteal;               Surgeon: Jama Cordella MATSU, MD;  Location: [facility] ORS;                Service: Vascular;  Laterality: Left;  BMI    Body Mass Index: 25.12 kg/m      Reproductive/Obstetrics negative OB ROS                              Anesthesia Physical Anesthesia Plan  ASA: 3  Anesthesia Plan: General   Post-op Pain Management:    Induction: Intravenous  PONV Risk Score and Plan: Ondansetron , Dexamethasone , Midazolam  and Treatment may vary due to age or medical condition  Airway Management Planned: Oral ETT  Additional Equipment:    Intra-op Plan:   Post-operative Plan: Extubation in OR  Informed Consent: I have reviewed the patients History and Physical, chart, labs and discussed the procedure including the risks, benefits and alternatives for the proposed anesthesia with the patient or authorized representative who has indicated his/her understanding and acceptance.     Dental Advisory Given  Plan Discussed with: Anesthesiologist, CRNA and Surgeon  Anesthesia Plan Comments:          Anesthesia Quick Evaluation

## 2023-11-16 NOTE — Progress Notes (Signed)
 Twyla Helling, RN in ICU verbal report via phone call.  Will transport pt to ICU 8.

## 2023-11-16 NOTE — Transfer of Care (Signed)
 Immediate Anesthesia Transfer of Care Note  Patient: Claudia Mills  Procedure(s) Performed: APPLICATION OF CELL SAVER CREATION, BYPASS, ARTERIAL, POPLITEAL ENDARTERECTOMY, FEMORAL ENDARTERECTOMY, POPLITEAL ENDARTERECTOMY TIBIOPERONEAL (CATHLAB) BYPASS GRAFT POPLITEAL TO TIBIAL (CATHLAB)  Patient Location: PACU  Anesthesia Type:General  Level of Consciousness: awake, alert , and drowsy  Airway & Oxygen Therapy: Patient Spontanous Breathing  Post-op Assessment: Report given to RN and Post -op Vital signs reviewed and stable  Post vital signs: Reviewed and stable  Last Vitals:  Vitals Value Taken Time  BP 146/87 11/16/23 15:18  Temp 35.8 1516  Pulse 115 11/16/23 15:25  Resp 16 11/16/23 15:23  SpO2 100 % 11/16/23 15:25  Vitals shown include unfiled device data.  Last Pain:  Vitals:   11/16/23 0709  TempSrc: Temporal  PainSc:          Complications: No notable events documented.

## 2023-11-16 NOTE — Progress Notes (Signed)
 PHARMACY - ANTICOAGULATION CONSULT NOTE  Pharmacy Consult for heparin  infusion Indication: Critical limb ischemia   Allergies  Allergen Reactions   Bee Venom Anaphylaxis    Patient Measurements: Height: 5' (152.4 cm) Weight: 58.3 kg (128 lb 9.6 oz) IBW/kg (Calculated) : 45.5 HEPARIN  DW (KG): 57.3  Vital Signs: Temp: 97.9 F (36.6 C) (10/01 2003) Temp Source: Oral (10/01 2003) BP: 142/69 (10/01 2003) Pulse Rate: 103 (10/01 2003)  Labs: Recent Labs    11/14/23 0338 11/15/23 0401 11/16/23 0444  HGB  --  12.5 12.1  HCT  --  37.1 36.5  PLT  --  227 234  HEPARINUNFRC 0.55 0.52 0.53  CREATININE  --   --  0.51    Estimated Creatinine Clearance: 65 mL/min (by C-G formula based on SCr of 0.51 mg/dL).   Medical History: Past Medical History:  Diagnosis Date   Coronary artery disease    Diabetes mellitus without complication (HCC)    Hypertension    MI (myocardial infarction) (HCC)    PVD (peripheral vascular disease)    Tick bite    Pt states she does not remember daye of tic bite, but that is was since last visit with pain clinic and she was hospitalized for 1 day.    Medications:  Scheduled:   sodium chloride    Intravenous Once   aspirin  EC  81 mg Oral Daily   atorvastatin   20 mg Oral Daily   Chlorhexidine  Gluconate Cloth  6 each Topical Daily   docusate sodium  100 mg Oral BID   DULoxetine   30 mg Oral Daily   gabapentin   300 mg Oral TID   insulin  aspart  0-15 Units Subcutaneous TID WC   insulin  aspart  0-5 Units Subcutaneous QHS   lisinopril   20 mg Oral Daily   pantoprazole   40 mg Oral Daily   polyethylene glycol  17 g Oral Daily   sodium chloride  flush  3 mL Intravenous Q12H   sodium chloride  flush  3 mL Intravenous Q12H   Infusions:   sodium chloride      sodium chloride      heparin  1,100 Units/hr (11/16/23 0416)   vancomycin      PRN: sodium chloride , acetaminophen , magnesium  hydroxide, morphine  injection, nitroGLYCERIN , oxyCODONE , polyethylene  glycol, sodium chloride  flush, sodium chloride  flush  Assessment: Patient presents to [facility] for treatment of her left lower extremity ischemia. On 11/06/23, she presented to [facility] and was evaluated in the emergency room and noted to have changes of the left forefoot consistent with ischemia. Pharmacy has been consulted to initiate and monitor continuous heparin  infusion. No chronic anticoagulation prior to arrival  Goal of Therapy:  anti-Xa level  0.3-0.7 units/ml Monitor platelets by anticoagulation protocol: Yes  Monitoring: 0928 @ 1536: HL 0.64 Therapeutic x 1 (1100 units/hr) 0928 @ 2124: HL 0.58           Therapeutic X 2  0929 @ 0430: HL 0.60           Therapeutic X 3 0930 0338 HL 0.55  Therapeutic x 4 1001 0401 HL 0.52  Therapeutic x 5 1002 0444 HL 0.53  Therapeutic x 6   Plan: ---Continue heparin  infusion at 1100 units/hr ---Recheck HL on 10/02 with AM labs  ---Continue to monitor H&H and platelets  Rankin CANDIE Dills, PharmD, [facility] 11/16/2023 6:54 AM

## 2023-11-16 NOTE — Op Note (Signed)
 [facility]    OPERATIVE NOTE   PROCEDURE: left profunda femoral artery to above-the-knee popliteal artery bypass with 6 mm ringed PTFE. Left popliteal to anterior tibial artery bypass with reversed great saphenous vein. Left profunda femoris endarterectomy. Left popliteal artery endarterectomy. 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 and Selinda CANDIE Gu, MD  ASSISTANT(S): none  ANESTHESIA: general  ESTIMATED BLOOD LOSS: 500 cc  FINDING(S): None   SPECIMEN(S): Plaque from the profunda femoris, popliteal and posterior tibial arteries to pathology for permanent section  INDICATIONS:   Claudia Mills is a 53 y.o. female who presents with ischemic rest pain of her left foot.  She has now undergone multiple interventions that have not yielded symptom relief.  At this point open bypass surgery has been recommended.  The risk, benefits, and alternative for bypass operations were discussed with the patient.  The patient is aware the risks include but are not limited to: bleeding, infection, myocardial infarction, stroke, limb loss, nerve damage, need for additional procedures in the future, wound complications, and inability to complete the bypass.  The patient is voices understanding of these risks and agreed to proceed.  DESCRIPTION: After informed consent was obtained, the patient was brought back to the operating room and placed in the supine position.  Prior to induction, the patient was given intravenous antibiotics.  After general anesthesia is induced, the patient was prepped and draped in the standard fashion for a femoral to popliteal bypass operation.   Appropriate timeout is called.   Co-surgeons are required because this is a complex multilevel procedure with work being performed simultaneously from both the patient's right and left sides.  This also expedites the procedure making a shorter operative time reducing complications and improving patient safety.  Attention was turned to the left groin.  The longitudinal incision was reopened over the left common femoral artery.  Using blunt dissection and electrocautery, the artery was dissected circumferentially from the inguinal ligament down to the femoral bifurcation.  The superficial femoral artery, profunda femoral artery, and distal external iliac artery are looped with Silastic vessel loops.  Circumflex branches were also dissected and controlled with vessel loops as needed.     Attention was then turned to the distal medial thigh.  An longitudinal incision was made over Hunter's canal.  Using blunt dissection and electrocautery, a plane was developed through the subcutaneous tissue and fascia down to Hunter's canal.  The above-the-knee popliteal artery was dissected circumferentially and looped with Silastic vessel loops proximally and distally.  Next attention was turned to the medial calf where a linear incision was made carried down through the soft tissues the fascia was opened and the muscles were reflected posteriorly.  This exposed the posterior tibial artery and then subsequently the dissection was carried deeper exposing the peroneal artery.  The peroneal artery appeared to be unusually small which was not consistent with the imaging on the angiogram.  Nevertheless it was dissected circumferentially and looped with Silastic Vesseloops.  The saphenofemoral junction was then identified and all tributaries are ligated and divided between silk ties. Once the saphenofemoral junction has been skeletonized the vein is inspected and found to be of small caliber but a size that could be used as bypass  conduit.  The saphenous vein was then dissected down to the distal thigh through several incisions.  Of note, the saphenous vein became too small to utilize at the level of the knee.  This was anticipated based on an ultrasound performed yesterday.  Therefore we elected to move forward with a combined femoral above-knee popliteal bypass using prosthetic in association with a vein bypass to the tibial below the knee.  Side branches of greater saphenous vein were ligated and divided with silk ties or surgical clips.  The saphenofemoral junction was clamped and the greater saphenous vein was transected.  The saphenofemoral junction was ligated with a 2-0 silk tie.  The distal extent of the vein was then ligated and divided. The harvested vein was placed in a papaverine and heparinized saline solution.  The patient was given 6000 units of Heparin  intravenously (additional 2000 unit boluses were given throughout the procedure), and this is allowed to circulate for proximally 5 minutes.  A 6 mm ringed PTFE graft was delivered onto the field and pulled subsartorial he.  The common femoral and profunda femoris were then controlled with vascular clamps and Silastic Vesseloops.  Arteriotomy was made in the profunda femoris directly and the flap noted on yesterday's arteriogram was readily identified.  This was resulting in a hemodynamically significant stenosis.  The flap of plaque was then removed using a freer elevator.  Once the endarterectomy was complete the proximal end of the PTFE graft was beveled and then applied to the side of the profunda femoris using CV 6 suture.  Flushing maneuvers were performed and flow was reestablished through the profunda femoris.  Attention was then turned to the above-knee popliteal.  The retractors were reset.  Arteriotomy was made in the popliteal artery with an 11 blade and extended with Potts scissors.  This segment of the artery was found to be thrombosed and associated with  hemodynamically significant plaque.  Using a freer elevator the plaque was removed and this resulted in rather brisk backbleeding.  The graft was then approximated to the popliteal artery and beveled.  End graft to side popliteal artery anastomosis was then fashioned using running CV 6 suture and flow was reestablished through the bypass.  The vein was then brought back onto the field and irrigated with papaverine.  Bleeding sidebranches were ligated with 6-0 Prolene suture.  The vein was then spatulated using Potts scissors.  Retractors were reset in the above-knee popliteal incision.  At a level approximately 2 cm distal to the prosthetic bypass a new completely separate arteriotomy was made.  Again plaque was encountered and endarterectomy was performed.  A #3 Fogarty was then advanced down into the distal popliteal and thrombectomy performed.  Thankfully, no residual thrombus was identified.  End saphenous vein to side popliteal artery anastomosis was fashioned using running 6-0 Prolene.  Flushing maneuvers were performed and flow was reestablished into the distal popliteal as well as through the saphenous vein.  At this point the intention was to perform a popliteal to peroneal bypass however when the retractors were reset in the calf incision the peroneal artery was noted to be pulsatile.  Based on yesterday's angiogram there appeared to be hemodynamically significant disease in the distal popliteal and tibioperoneal trunk so this was somewhat surprising.  In lieu of this new finding x-ray was called and and on table angiogram was performed.  A 20-gauge Angiocath was inserted into the distal portion of the PTFE bypass.  Angiography demonstrated between the PTFE distal anastomosis and the  vein bypass proximal anastomosis there appeared to be a 50 to 60% narrowing.  It also demonstrated that the peroneal was diffusely diseased and indeed would not be an acceptable vessel for bypass.  Given these findings we  elected to move forward with a popliteal to anterior tibial bypass.  But first needed to correct of the lesion between the PTFE distal anastomosis and the proximal anastomosis of the vein graft.  Pursestring suture CV 6 was then placed around the Angiocath and the Angiocath was removed.  Next a linear incision was made over the anastomosis and the graft itself.  A 5 mm Garrett coronary dilator was then advanced through the bypass into the distal popliteal.  This passed without any difficulty.  Direct visual inspection did not identify a lesion.  The graftotomy was then closed with a CV 6 suture.  At this point given that the posterior tibial artery was already essentially exposed we elected to open the artery and prove that it was indeed occluded.  (It was nonvisualized yesterday on the arteriogram as well as on the on table angiogram performed today).  The posterior tibial artery was incised with an 11 blade and extended with Potts scissors.  It was indeed filled with plaque.  I attempted to pass a 1.5 mm [place] coronary dilator but this was not successful.  At this point posterior tibial artery was oversewn with 6-0 Prolene.  And we elected to move forward with the anterior tibial artery exposure.  A linear incision was then made over the anterior tibial artery and the dissection was carried down through the fascia separating the muscles and identifying the anterior tibial artery and veins.  Artery was dissected circumferentially and looped with blue Silastic Vesseloops.  The fascia between the fibula and the tibia was then opened.  The saphenous vein was then pulled through verifying proper orientation and pulsatile flow.  Arteriotomy was then made in the anterior tibial artery and extended with Potts scissors.  The vein was then trimmed to an appropriate bevel and length and an end vein to side anterior tibial artery anastomosis was fashion with running 6-0 Prolene.  Flushing maneuvers were performed and  flow was reestablished through the vein bypass into the anterior tibial artery.  The wounds were then irrigated with Vashe.  The wounds were then inspected for hemostasis. Bleeding points were controlled with electrocautery, and suture repair of active bleeding points.  Once hemostasis was achieved.  The groin incision was then closed in multiple layers using both 2-0 and 3-0 Vicryl in interrupted and running fashion. Skin was closed with a 4-0 Monocryl subcuticular and Dermabond was applied. In a similar fashion, the medial thigh wound was then closed in multiple layers using 2-0 Vicryl, and 3-0 Vicryl, and a running subcuticular of 4-0 Monocryl subcuticular. Dermabond was applied.   A sterile dressing was applied to each incision.   COMPLICATIONS: None  CONDITION: Improved  Cordella KANDICE Shawl, M.D. [facility] Office: [phone]  11/16/2023, 2:55 PM

## 2023-11-16 NOTE — Interval H&P Note (Signed)
 History and Physical Interval Note:  11/16/2023 7:29 AM  Claudia Mills  has presented today for surgery, with the diagnosis of ASO left lower extremity.  The various methods of treatment have been discussed with the patient and family. After consideration of risks, benefits and other options for treatment, the patient has consented to  Procedure(s): CREATION, BYPASS, ARTERIAL, FEMORAL TO PERONEAL, USING GRAFT (Left) APPLICATION OF CELL SAVER (N/A) as a surgical intervention.  The patient's history has been reviewed, patient examined, no change in status, stable for surgery.  I have reviewed the patient's chart and labs.  Questions were answered to the patient's satisfaction.     [name]

## 2023-11-17 ENCOUNTER — Encounter: Payer: Self-pay | Admitting: Vascular Surgery

## 2023-11-17 LAB — TYPE AND SCREEN
ABO/RH(D): O POS
Antibody Screen: NEGATIVE
Unit division: 0
Unit division: 0

## 2023-11-17 LAB — BPAM RBC
Blood Product Expiration Date: 202510272359
Blood Product Expiration Date: 202510272359
Unit Type and Rh: 5100
Unit Type and Rh: 5100

## 2023-11-17 LAB — CBC
HCT: 27.8 % — ABNORMAL LOW (ref 36.0–46.0)
Hemoglobin: 9.3 g/dL — ABNORMAL LOW (ref 12.0–15.0)
MCH: 34.6 pg — ABNORMAL HIGH (ref 26.0–34.0)
MCHC: 33.5 g/dL (ref 30.0–36.0)
MCV: 103.3 fL — ABNORMAL HIGH (ref 80.0–100.0)
Platelets: 182 K/uL (ref 150–400)
RBC: 2.69 MIL/uL — ABNORMAL LOW (ref 3.87–5.11)
RDW: 14.1 % (ref 11.5–15.5)
WBC: 12.9 K/uL — ABNORMAL HIGH (ref 4.0–10.5)
nRBC: 0 % (ref 0.0–0.2)

## 2023-11-17 LAB — PREPARE RBC (CROSSMATCH)

## 2023-11-17 LAB — GLUCOSE, CAPILLARY
Glucose-Capillary: 185 mg/dL — ABNORMAL HIGH (ref 70–99)
Glucose-Capillary: 121 mg/dL — ABNORMAL HIGH (ref 70–99)
Glucose-Capillary: 177 mg/dL — ABNORMAL HIGH (ref 70–99)
Glucose-Capillary: 122 mg/dL — ABNORMAL HIGH (ref 70–99)

## 2023-11-17 LAB — BASIC METABOLIC PANEL WITH GFR
Anion gap: 10 (ref 5–15)
BUN: 7 mg/dL (ref 6–20)
CO2: 22 mmol/L (ref 22–32)
Calcium: 7.9 mg/dL — ABNORMAL LOW (ref 8.9–10.3)
Chloride: 104 mmol/L (ref 98–111)
Creatinine, Ser: 0.58 mg/dL (ref 0.44–1.00)
GFR, Estimated: >60 mL/min (ref >60)
Glucose, Bld: 170 mg/dL — ABNORMAL HIGH (ref 70–99)
Potassium: 3.8 mmol/L (ref 3.5–5.1)
Sodium: 136 mmol/L (ref 135–145)

## 2023-11-17 NOTE — Evaluation (Signed)
 Physical Therapy Re-Evaluation Patient Details Name: Claudia Mills MRN: [id number] DOB: 01-14-1971 Today's Date: 11/17/2023  History of Present Illness  Pt is a 53 y/o F with c/o pain and toe discoloration. PMH: L SFA and external iliac stent on 08/08/23. underwent L common femoral endarterectomy, L SFA thrombectomy on 11/09/23. PMH significant for HTN, DM, PAD. s/p vascular surgery 10/2  Clinical Impression  Patient is agreeable to PT session. She reports 5/10 pain in the LLE with recent pain meds given. The patient guards the left leg with movement but ultimately only required occasional minimal assistance to sit edge of bed and to stand. Decreased weight acceptance on LLE for brief standing bout of around 10 seconds. Anticipate increased independence and mobility as pain improves. PT will continue to follow to maximize independence and decrease caregiver burden.       If plan is discharge home, recommend the following: A little help with walking and/or transfers;A little help with bathing/dressing/bathroom;Assistance with cooking/housework;Help with stairs or ramp for entrance;Assist for transportation   Can travel by private vehicle        Equipment Recommendations None recommended by PT  Recommendations for Other Services       Functional Status Assessment Patient has had a recent decline in their functional status and demonstrates the ability to make significant improvements in function in a reasonable and predictable amount of time.     Precautions / Restrictions Precautions Precautions: Fall Recall of Precautions/Restrictions: Intact Restrictions Weight Bearing Restrictions Per Provider Order: No      Mobility  Bed Mobility Overal bed mobility: Needs Assistance Bed Mobility: Supine to Sit     Supine to sit: Min assist Sit to supine: Min assist   General bed mobility comments: assistance for trunk support to sit upright. assistance for LLE to return to bed. patient is  cautious and slow with movement, guarding the left leg    Transfers Overall transfer level: Needs assistance Equipment used: 1 person hand held assist Transfers: Sit to/from Stand Sit to Stand: Min assist           General transfer comment: lifting assistance provided. decreased weight acceptance on LLE    Ambulation/Gait               General Gait Details: not attempted today due to pain  Stairs            Wheelchair Mobility     Tilt Bed    Modified Rankin (Stroke Patients Only)       Balance Overall balance assessment: Needs assistance Sitting-balance support: Feet supported Sitting balance-Leahy Scale: Good     Standing balance support: Single extremity supported Standing balance-Leahy Scale: Poor Standing balance comment: external support provided. could benefit from assistive device when ready for more standing activity                             Pertinent Vitals/Pain Pain Assessment Pain Assessment: 0-10 Pain Score: 5  Pain Location: LLE Pain Descriptors / Indicators: Discomfort Pain Intervention(s): Limited activity within patient's tolerance, Monitored during session, Premedicated before session, Repositioned (recent IV pain meds)    Home Living Family/patient expects to be discharged to:: Private residence Living Arrangements: Parent (mother) Available Help at Discharge: Family;Available PRN/intermittently Type of Home: Mobile home Home Access: Stairs to enter Entrance Stairs-Rails: Right;Left;Can reach both Entrance Stairs-Number of Steps: 5   Home Layout: One level Home Equipment: Rollator (4 wheels);Shower seat;Toilet riser  Prior Function Prior Level of Function : Independent/Modified Independent;Driving             Mobility Comments: pt reports using rollator for mobility, denies falls. gait distance limited by leg pain       Extremity/Trunk Assessment   Upper Extremity Assessment Upper Extremity  Assessment: Overall WFL for tasks assessed    Lower Extremity Assessment Lower Extremity Assessment: LLE deficits/detail LLE Deficits / Details: patient able to activate ankle movement, SLR independently LLE: Unable to fully assess due to pain       Communication   Communication Communication: No apparent difficulties    Cognition Arousal: Alert Behavior During Therapy: WFL for tasks assessed/performed   PT - Cognitive impairments: No apparent impairments                         Following commands: Intact       Cueing Cueing Techniques: Verbal cues     General Comments General comments (skin integrity, edema, etc.): vitals stable throughout session    Exercises     Assessment/Plan    PT Assessment Patient needs continued PT services  PT Problem List Decreased strength;Decreased range of motion;Decreased activity tolerance;Decreased balance;Decreased mobility;Pain       PT Treatment Interventions DME instruction;Gait training;Stair training;Functional mobility training;Therapeutic activities;Therapeutic exercise;Balance training;Neuromuscular re-education;Cognitive remediation;Patient/family education    PT Goals (Current goals can be found in the Care Plan section)  Acute Rehab PT Goals Patient Stated Goal: to go home PT Goal Formulation: With patient Time For Goal Achievement: 12/01/23 Potential to Achieve Goals: Good    Frequency Min 2X/week     Co-evaluation               AM-PAC PT 6 Clicks Mobility  Outcome Measure Help needed turning from your back to your side while in a flat bed without using bedrails?: None Help needed moving from lying on your back to sitting on the side of a flat bed without using bedrails?: A Little Help needed moving to and from a bed to a chair (including a wheelchair)?: A Little Help needed standing up from a chair using your arms (e.g., wheelchair or bedside chair)?: A Little Help needed to walk in hospital  room?: A Little Help needed climbing 3-5 steps with a railing? : A Lot 6 Click Score: 18    End of Session   Activity Tolerance: Patient tolerated treatment well;Patient limited by pain Patient left: in bed;with call bell/phone within reach;with bed alarm set Nurse Communication: Mobility status PT Visit Diagnosis: Pain;Difficulty in walking, not elsewhere classified (R26.2) Pain - Right/Left: Left Pain - part of body: Leg    Time: 9159-9144 PT Time Calculation (min) (ACUTE ONLY): 15 min   Charges:   PT Evaluation $PT Re-evaluation: 1 Re-eval   PT General Charges $$ ACUTE PT VISIT: 1 Visit         Randine Essex, PT, MPT   Randine LULLA Essex 11/17/2023, 9:11 AM

## 2023-11-17 NOTE — Progress Notes (Signed)
 Progress Note    11/17/2023 2:30 PM 1 Day Post-Op  Subjective:  Claudia Mills is a 53 yo female now POD #1 from:  PROCEDURE: left profunda femoral artery to above-the-knee popliteal artery bypass with 6 mm ringed PTFE. Left popliteal to anterior tibial artery bypass with reversed great saphenous vein. Left profunda femoris endarterectomy. Left popliteal artery endarterectomy. Exploration of the peroneal and the posterior tibial arteries with endarterectomy of the posterior tibial  POD #7 From:   PROCEDURE: 1.   Left common femoral endarterectomy and  bovine pericardial patch angioplasty 2.   Left SFA and popliteal artery embolectomy with a 3 Fogarty embolectomy balloon   AND   POD #5 FROM:   Procedure(s) Performed:             1.  Introduction catheter into left lower extremity 3rd order catheter placement              2.  Contrast injection left lower extremity for distal runoff             3.  Percutaneous transluminal angioplasty and stent placement left external iliac artery.             4.  Percutaneous mechanical thrombectomy of the left superficial femoral artery with a combination of 10 mg of tPA and the [place] thrombectomy catheter.                5. Percutaneous transluminal angioplasty left superficial femoral artery.               6.  Celt closure right common femoral arteriotomy  Patient is resting comfortably in bed this morning. Endorses pian to her left leg this morning. This is to be expected. She endorses her toes don't hurt as much today. Endorses her toes feel warmer today.   Vitals:   11/17/23 1300 11/17/23 1400  BP: 137/61 (!) 118/58  Pulse: (!) 102 (!) 107  Resp: 19 17  Temp:    SpO2: 99% 100%   Physical Exam: Cardiac:  RRR, normal S1 and S2.  No murmurs. Lungs: Clear throughout on auscultation but diminished in the bases.  No rales rhonchi or wheezing noted. Incisions: Left groin incision with dressing clean dry and intact.  No hematoma seroma  no. Left Thigh and left calf incisions with dressings clean dry and intact.  Extremities: Bilateral lower extremities warm to touch with good Doppler pulses.  Patient does not have a Doppler pulse in her left dorsalis pedis. This was known prior to surgery.  Abdomen: Positive bowel sounds throughout, soft, nontender nondistended. Neurologic: Alert and oriented x 3, answers questions and follows commands   CBC    Component Value Date/Time   WBC 12.9 (H) 11/17/2023 0418   RBC 2.69 (L) 11/17/2023 0418   HGB 9.3 (L) 11/17/2023 0418   HCT 27.8 (L) 11/17/2023 0418   PLT 182 11/17/2023 0418   MCV 103.3 (H) 11/17/2023 0418   MCH 34.6 (H) 11/17/2023 0418   MCHC 33.5 11/17/2023 0418   RDW 14.1 11/17/2023 0418   LYMPHSABS 2.2 09/20/2021 1849   MONOABS 0.4 09/20/2021 1849   EOSABS 0.1 09/20/2021 1849   BASOSABS 0.1 09/20/2021 1849    BMET    Component Value Date/Time   NA 136 11/17/2023 0418   K 3.8 11/17/2023 0418   CL 104 11/17/2023 0418   CO2 22 11/17/2023 0418   GLUCOSE 170 (H) 11/17/2023 0418   BUN 7 11/17/2023 0418   CREATININE 0.58 11/17/2023  0418   CREATININE 0.58 09/20/2022 0930   CALCIUM  7.9 (L) 11/17/2023 0418   GFRNONAA >60 11/17/2023 0418   GFRAA >60 12/30/2015 2048    INR    Component Value Date/Time   INR 1.0 11/08/2023 1611     Intake/Output Summary (Last 24 hours) at 11/17/2023 1430 Last data filed at 11/17/2023 1400 Gross per 24 hour  Intake 2375.33 ml  Output 720 ml  Net 1655.33 ml     Assessment/Plan:  53 y.o. female is s/p SEE ABOVE 1 Day Post-Op   PLAN Pain medication PRN Advance diet as tolerated.  OOB to Bedside chair PT/OT  Plan to transfer to the floor in AM if doing well.   DVT prophylaxis:  Lovenox 40 mg daily, ASA 81 mg Daily, Plavix 75 MG Daily.    [facility]ia [facility] 11/17/2023 2:30 PM

## 2023-11-17 NOTE — Anesthesia Postprocedure Evaluation (Signed)
 Anesthesia Post Note  Patient: Claudia Mills  Procedure(s) Performed: APPLICATION OF CELL SAVER CREATION, BYPASS, ARTERIAL, POPLITEAL ENDARTERECTOMY, FEMORAL ENDARTERECTOMY, POPLITEAL ENDARTERECTOMY TIBIOPERONEAL (CATHLAB) BYPASS GRAFT POPLITEAL TO TIBIAL (CATHLAB)  Patient location during evaluation: ICU Anesthesia Type: General Level of consciousness: awake and alert Pain management: pain level controlled Vital Signs Assessment: post-procedure vital signs reviewed and stable Respiratory status: spontaneous breathing and respiratory function stable Cardiovascular status: blood pressure returned to baseline and stable Postop Assessment: no headache, no backache and no apparent nausea or vomiting Anesthetic complications: no   No notable events documented.   Last Vitals:  Vitals:   11/17/23 0400 11/17/23 0500  BP: (!) 143/67 131/67  Pulse: 97 (!) 102  Resp: (!) 21 16  Temp: 36.7 C   SpO2: 99% 98%    Last Pain:  Vitals:   11/17/23 0450  TempSrc:   PainSc: 2                  Woodford Strege

## 2023-11-17 NOTE — Evaluation (Signed)
 Occupational Therapy Evaluation Patient Details Name: Claudia Mills MRN: [id number] DOB: 03-10-1970 Today's Date: 11/17/2023   History of Present Illness   Pt is a 53 y/o F with c/o pain and toe discoloration. PMH: L SFA and external iliac stent on 08/08/23. underwent L common femoral endarterectomy, L SFA thrombectomy on 11/09/23. PMH significant for HTN, DM, PAD. s/p vascular surgery 10/2     Clinical Impressions Patient presenting with decreased Ind in self care,balance, functional mobility/transfers, endurance, and safety awareness. Patient reports being Mod I at home with use of rollator and lives with mother. Pt endorsing pain in L foot. Patient currently functioning at min A overall but needing increased encouragement for all mobility tasks. Pt taking several side steps with RW but does not weight bear on L LE during task. She returns to bed at end of session. Call bell and all needed items within reach.  Patient will benefit from acute OT to increase overall independence in the areas of ADLs, functional mobility, and safety awareness in order to safely discharge.     If plan is discharge home, recommend the following:   A little help with walking and/or transfers;A little help with bathing/dressing/bathroom     Functional Status Assessment   Patient has had a recent decline in their functional status and demonstrates the ability to make significant improvements in function in a reasonable and predictable amount of time.     Equipment Recommendations   BSC/3in1      Precautions/Restrictions   Precautions Precautions: Fall Recall of Precautions/Restrictions: Intact Restrictions Weight Bearing Restrictions Per Provider Order: No     Mobility Bed Mobility Overal bed mobility: Needs Assistance Bed Mobility: Supine to Sit, Sit to Supine     Supine to sit: Min assist Sit to supine: Min assist        Transfers Overall transfer level: Needs assistance Equipment  used: Rolling walker (2 wheels) Transfers: Sit to/from Stand Sit to Stand: Min assist                  Balance Overall balance assessment: Needs assistance Sitting-balance support: Feet supported Sitting balance-Leahy Scale: Good Sitting balance - Comments: steady static and dynamic sitting   Standing balance support: Single extremity supported Standing balance-Leahy Scale: Poor                             ADL either performed or assessed with clinical judgement   ADL Overall ADL's : Needs assistance/impaired                         Toilet Transfer: Minimal assistance;Rolling walker (2 wheels) Toilet Transfer Details (indicate cue type and reason): simulated         Functional mobility during ADLs: Minimal assistance;Rolling walker (2 wheels)       Vision Patient Visual Report: No change from baseline              Pertinent Vitals/Pain Pain Assessment Pain Assessment: 0-10 Pain Score: 5  Pain Location: LLE Pain Descriptors / Indicators: Discomfort Pain Intervention(s): Limited activity within patient's tolerance, Monitored during session, Premedicated before session, Repositioned     Extremity/Trunk Assessment Upper Extremity Assessment Upper Extremity Assessment: Overall WFL for tasks assessed   Lower Extremity Assessment Lower Extremity Assessment: Defer to PT evaluation LLE Deficits / Details: patient able to activate ankle movement, SLR independently LLE: Unable to fully assess due to pain  Communication Communication Communication: No apparent difficulties   Cognition Arousal: Alert Behavior During Therapy: WFL for tasks assessed/performed Cognition: No apparent impairments                               Following commands: Intact       Cueing  General Comments   Cueing Techniques: Verbal cues  vitals stable throughout session           Home Living Family/patient expects to be discharged  to:: Private residence Living Arrangements: Parent (mother) Available Help at Discharge: Family;Available PRN/intermittently Type of Home: Mobile home Home Access: Stairs to enter Entrance Stairs-Number of Steps: 5 Entrance Stairs-Rails: Right;Left;Can reach both Home Layout: One level         Bathroom Toilet: Standard     Home Equipment: Rollator (4 wheels);Shower seat;Toilet riser          Prior Functioning/Environment Prior Level of Function : Independent/Modified Independent;Driving             Mobility Comments: pt reports using rollator for mobility, denies falls. gait distance limited by leg pain ADLs Comments: Pt states that she is mostly IND with ADLs, drives regularly    OT Problem List: Decreased strength;Decreased activity tolerance;Impaired balance (sitting and/or standing);Decreased safety awareness;Decreased knowledge of use of DME or AE;Pain   OT Treatment/Interventions: Self-care/ADL training;Therapeutic exercise;Patient/family education;Balance training;Energy conservation;Therapeutic activities      OT Goals(Current goals can be found in the care plan section)   Acute Rehab OT Goals Patient Stated Goal: to decrease pain OT Goal Formulation: With patient Time For Goal Achievement: 12/01/23 Potential to Achieve Goals: Fair ADL Goals Pt Will Perform Grooming: with modified independence;standing Pt Will Perform Lower Body Dressing: with modified independence;sit to/from stand Pt Will Transfer to Toilet: with modified independence;ambulating Pt Will Perform Toileting - Clothing Manipulation and hygiene: with modified independence;sit to/from stand   OT Frequency:  Min 2X/week       AM-PAC OT 6 Clicks Daily Activity     Outcome Measure Help from another person eating meals?: None Help from another person taking care of personal grooming?: A Little Help from another person toileting, which includes using toliet, bedpan, or urinal?: A Lot Help  from another person bathing (including washing, rinsing, drying)?: A Lot Help from another person to put on and taking off regular upper body clothing?: None Help from another person to put on and taking off regular lower body clothing?: A Lot 6 Click Score: 17   End of Session Equipment Utilized During Treatment: Rolling walker (2 wheels) Nurse Communication: Mobility status  Activity Tolerance: Patient tolerated treatment well Patient left: in bed;with call bell/phone within reach;with bed alarm set  OT Visit Diagnosis: Unsteadiness on feet (R26.81);Repeated falls (R29.6);Muscle weakness (generalized) (M62.81)                Time: 1022-1040 OT Time Calculation (min): 18 min Charges:  OT General Charges $OT Visit: 1 Visit OT Evaluation $OT Eval Moderate Complexity: 1 Mod OT Treatments $Therapeutic Activity: 8-22 mins Izetta Claude, MS, OTR/L , CBIS ascom [phone]  11/17/23, 12:41 PM

## 2023-11-18 LAB — GLUCOSE, CAPILLARY
Glucose-Capillary: 130 mg/dL — ABNORMAL HIGH (ref 70–99)
Glucose-Capillary: 159 mg/dL — ABNORMAL HIGH (ref 70–99)
Glucose-Capillary: 175 mg/dL — ABNORMAL HIGH (ref 70–99)
Glucose-Capillary: 272 mg/dL — ABNORMAL HIGH (ref 70–99)
Glucose-Capillary: 63 mg/dL — ABNORMAL LOW (ref 70–99)
Glucose-Capillary: 99 mg/dL (ref 70–99)

## 2023-11-18 LAB — BASIC METABOLIC PANEL WITH GFR
Anion gap: 9 (ref 5–15)
BUN: <5 mg/dL — ABNORMAL LOW (ref 6–20)
CO2: 24 mmol/L (ref 22–32)
Calcium: 8.1 mg/dL — ABNORMAL LOW (ref 8.9–10.3)
Chloride: 100 mmol/L (ref 98–111)
Creatinine, Ser: 0.55 mg/dL (ref 0.44–1.00)
GFR, Estimated: >60 mL/min (ref >60)
Glucose, Bld: 145 mg/dL — ABNORMAL HIGH (ref 70–99)
Potassium: 3.5 mmol/L (ref 3.5–5.1)
Sodium: 133 mmol/L — ABNORMAL LOW (ref 135–145)

## 2023-11-18 NOTE — Plan of Care (Signed)

## 2023-11-18 NOTE — Progress Notes (Signed)
 [facility]  Daily Progress Note   Subjective  -   POD # 2 LLE bypass.  Says leg and foot are warm but very sore.  Objective Vitals:   11/18/23 0400 11/18/23 0700 11/18/23 0800 11/18/23 0842  BP: (!) 152/73 (!) 145/69 109/67   Pulse: (!) 105 (!) 108 (!) 113 (!) 110  Resp: 18 (!) 21 15 17   Temp: 98.7 F (37.1 C)  99.2 F (37.3 C)   TempSrc: Oral  Oral   SpO2: 100% 99% 100% 100%  Weight:      Height:        Intake/Output Summary (Last 24 hours) at 11/18/2023 1014 Last data filed at 11/18/2023 0130 Gross per 24 hour  Intake 592.71 ml  Output 900 ml  Net -307.29 ml    PULM  CTAB CV  RRR VASC  Doppler Left PTA.  Left DPA no signal but this is a chronic finding.  Great toe tip slightly bluish.  No frank tissue necrosis.  Calf warm and soft.  Dressings on incisions are clean and dry.  Has motor and sensory function limited by discomfort.  Laboratory CBC    Component Value Date/Time   WBC 12.9 (H) 11/17/2023 0418   HGB 9.3 (L) 11/17/2023 0418   HCT 27.8 (L) 11/17/2023 0418   PLT 182 11/17/2023 0418    BMET    Component Value Date/Time   NA 133 (L) 11/18/2023 0356   K 3.5 11/18/2023 0356   CL 100 11/18/2023 0356   CO2 24 11/18/2023 0356   GLUCOSE 145 (H) 11/18/2023 0356   BUN <5 (L) 11/18/2023 0356   CREATININE 0.55 11/18/2023 0356   CREATININE 0.58 09/20/2022 0930   CALCIUM  8.1 (L) 11/18/2023 0356   GFRNONAA >60 11/18/2023 0356   GFRAA >60 12/30/2015 2048    Assessment/Planning: POD #2 s/p L leg bypass  OK to Floor today.  Has stood with PT twice but leg and foot hurt.  Has not walked yet (cannot yet bear weight). On ASA/Plavix/Lovenox/statin Continue OT/PT  Claudia Mills  11/18/2023, 10:14 AM

## 2023-11-18 NOTE — Plan of Care (Signed)
  Problem: Education: Goal: Knowledge of General Education information will improve Description: Including pain rating scale, medication(s)/side effects and non-pharmacologic comfort measures Outcome: Not Progressing   Problem: Health Behavior/Discharge Planning: Goal: Ability to manage health-related needs will improve Outcome: Not Progressing   Problem: Clinical Measurements: Goal: Ability to maintain clinical measurements within normal limits will improve Outcome: Not Progressing Goal: Will remain free from infection Outcome: Not Progressing Goal: Diagnostic test results will improve Outcome: Not Progressing Goal: Respiratory complications will improve Outcome: Not Progressing Goal: Cardiovascular complication will be avoided Outcome: Not Progressing   Problem: Activity: Goal: Risk for activity intolerance will decrease Outcome: Not Progressing   Problem: Nutrition: Goal: Adequate nutrition will be maintained Outcome: Not Progressing   Problem: Coping: Goal: Level of anxiety will decrease Outcome: Not Progressing   Problem: Elimination: Goal: Will not experience complications related to bowel motility Outcome: Not Progressing Goal: Will not experience complications related to urinary retention Outcome: Not Progressing   Problem: Pain Managment: Goal: General experience of comfort will improve and/or be controlled Outcome: Not Progressing   Problem: Safety: Goal: Ability to remain free from injury will improve Outcome: Not Progressing   Problem: Skin Integrity: Goal: Risk for impaired skin integrity will decrease Outcome: Not Progressing   Problem: Education: Goal: Ability to describe self-care measures that may prevent or decrease complications (Diabetes Survival Skills Education) will improve Outcome: Not Progressing Goal: Individualized Educational Video(s) Outcome: Not Progressing   Problem: Coping: Goal: Ability to adjust to condition or change in  health will improve Outcome: Not Progressing   Problem: Fluid Volume: Goal: Ability to maintain a balanced intake and output will improve Outcome: Not Progressing   Problem: Health Behavior/Discharge Planning: Goal: Ability to identify and utilize available resources and services will improve Outcome: Not Progressing Goal: Ability to manage health-related needs will improve Outcome: Not Progressing   Problem: Metabolic: Goal: Ability to maintain appropriate glucose levels will improve Outcome: Not Progressing   Problem: Nutritional: Goal: Maintenance of adequate nutrition will improve Outcome: Not Progressing Goal: Progress toward achieving an optimal weight will improve Outcome: Not Progressing   Problem: Skin Integrity: Goal: Risk for impaired skin integrity will decrease Outcome: Not Progressing   Problem: Tissue Perfusion: Goal: Adequacy of tissue perfusion will improve Outcome: Not Progressing   Problem: Education: Goal: Understanding of CV disease, CV risk reduction, and recovery process will improve Outcome: Not Progressing Goal: Individualized Educational Video(s) Outcome: Not Progressing   Problem: Activity: Goal: Ability to return to baseline activity level will improve Outcome: Not Progressing   Problem: Cardiovascular: Goal: Ability to achieve and maintain adequate cardiovascular perfusion will improve Outcome: Not Progressing Goal: Vascular access site(s) Level 0-1 will be maintained Outcome: Not Progressing   Problem: Health Behavior/Discharge Planning: Goal: Ability to safely manage health-related needs after discharge will improve Outcome: Not Progressing   Problem: Education: Goal: Knowledge of prescribed regimen will improve Outcome: Not Progressing   Problem: Activity: Goal: Ability to tolerate increased activity will improve Outcome: Not Progressing   Problem: Bowel/Gastric: Goal: Gastrointestinal status for postoperative course will  improve Outcome: Not Progressing   Problem: Clinical Measurements: Goal: Postoperative complications will be avoided or minimized Outcome: Not Progressing Goal: Signs and symptoms of graft occlusion will improve Outcome: Not Progressing   Problem: Skin Integrity: Goal: Demonstration of wound healing without infection will improve Outcome: Not Progressing

## 2023-11-19 LAB — BASIC METABOLIC PANEL WITH GFR
Anion gap: 7 (ref 5–15)
BUN: <5 mg/dL — ABNORMAL LOW (ref 6–20)
CO2: 26 mmol/L (ref 22–32)
Calcium: 8 mg/dL — ABNORMAL LOW (ref 8.9–10.3)
Chloride: 102 mmol/L (ref 98–111)
Creatinine, Ser: 0.35 mg/dL — ABNORMAL LOW (ref 0.44–1.00)
GFR, Estimated: >60 mL/min (ref >60)
Glucose, Bld: 136 mg/dL — ABNORMAL HIGH (ref 70–99)
Potassium: 3.3 mmol/L — ABNORMAL LOW (ref 3.5–5.1)
Sodium: 135 mmol/L (ref 135–145)

## 2023-11-19 LAB — GLUCOSE, CAPILLARY
Glucose-Capillary: 153 mg/dL — ABNORMAL HIGH (ref 70–99)
Glucose-Capillary: 178 mg/dL — ABNORMAL HIGH (ref 70–99)
Glucose-Capillary: 98 mg/dL (ref 70–99)
Glucose-Capillary: 182 mg/dL — ABNORMAL HIGH (ref 70–99)
Glucose-Capillary: 161 mg/dL — ABNORMAL HIGH (ref 70–99)
Glucose-Capillary: 191 mg/dL — ABNORMAL HIGH (ref 70–99)

## 2023-11-19 NOTE — Plan of Care (Signed)
  Problem: Education: Goal: Knowledge of General Education information will improve Description: Including pain rating scale, medication(s)/side effects and non-pharmacologic comfort measures Outcome: Progressing   Problem: Nutrition: Goal: Adequate nutrition will be maintained Outcome: Progressing   Problem: Coping: Goal: Level of anxiety will decrease Outcome: Progressing   Problem: Elimination: Goal: Will not experience complications related to bowel motility Outcome: Progressing   Problem: Pain Managment: Goal: General experience of comfort will improve and/or be controlled Outcome: Progressing   Problem: Safety: Goal: Ability to remain free from injury will improve Outcome: Progressing

## 2023-11-19 NOTE — Progress Notes (Signed)
 [facility]  Daily Progress Note   Subjective  -   Her leg feels better today.  More motor function and able to stand and walk with PT a little.  Still no BM but took Miralax today.  Not using much pain med.  Objective Vitals:   11/18/23 2141 11/18/23 2343 11/19/23 0346 11/19/23 1241  BP:  (!) 142/66 (!) 152/68 134/64  Pulse: (!) 108 (!) 110 (!) 108 (!) 105  Resp:    18  Temp:  99.2 F (37.3 C) 98.7 F (37.1 C) 98.6 F (37 C)  TempSrc:    Oral  SpO2: 100% 99% 99% 100%  Weight:      Height:        Intake/Output Summary (Last 24 hours) at 11/19/2023 1513 Last data filed at 11/19/2023 0600 Gross per 24 hour  Intake 1297.36 ml  Output 1000 ml  Net 297.36 ml    PULM  CTAB CV  RRR VASC  Warm leg and foot with Doppler flow.  Soft compartments.  Discolored great and second toes but all are equally painful.  No gross necrotic change.  Has some left groin dressing blood still under the dressing.  Will change dressing tomorrow.  Laboratory CBC    Component Value Date/Time   WBC 12.9 (H) 11/17/2023 0418   HGB 9.3 (L) 11/17/2023 0418   HCT 27.8 (L) 11/17/2023 0418   PLT 182 11/17/2023 0418    BMET    Component Value Date/Time   NA 135 11/19/2023 0304   K 3.3 (L) 11/19/2023 0304   CL 102 11/19/2023 0304   CO2 26 11/19/2023 0304   GLUCOSE 136 (H) 11/19/2023 0304   BUN <5 (L) 11/19/2023 0304   CREATININE 0.35 (L) 11/19/2023 0304   CREATININE 0.58 09/20/2022 0930   CALCIUM  8.0 (L) 11/19/2023 0304   GFRNONAA >60 11/19/2023 0304   GFRAA >60 12/30/2015 2048    Assessment/Planning: POD #3 s/p LLE bypass  Improving from her pre-operative ischemic period.  Currently adequately perfused and improving.   Claudia Mills  11/19/2023, 3:13 PM

## 2023-11-19 NOTE — Plan of Care (Signed)
  Problem: Education: Goal: Knowledge of General Education information will improve Description: Including pain rating scale, medication(s)/side effects and non-pharmacologic comfort measures Outcome: Progressing   Problem: Health Behavior/Discharge Planning: Goal: Ability to manage health-related needs will improve Outcome: Progressing   Problem: Clinical Measurements: Goal: Ability to maintain clinical measurements within normal limits will improve Outcome: Progressing   Problem: Clinical Measurements: Goal: Diagnostic test results will improve Outcome: Progressing   Problem: Activity: Goal: Risk for activity intolerance will decrease Outcome: Progressing   

## 2023-11-19 NOTE — Progress Notes (Signed)
   11/19/23 9762  Notify: Charge Nurse/RN  Name of Charge Nurse/RN Notified Donna/Kimberly  Provider Notification  Provider Name/Title Dr. KYM Hurst  Date Provider Notified 11/19/23  Time Provider Notified [phone]  Method of Notification Page  Notification Reason Requested by patient/family  Provider response  (No new orders)   When completing shift assessment patient reports no BM since 9/22. Patient states they have tried stool softeners however they have not been effective. Patient requested something stronger then stool softeners or lactulose since they haven't worked.Would like something ordered for 4 PM after PT/OT and family visits.  No new orders from Dr. Hurst.

## 2023-11-20 LAB — GLUCOSE, CAPILLARY
Glucose-Capillary: 152 mg/dL — ABNORMAL HIGH (ref 70–99)
Glucose-Capillary: 100 mg/dL — ABNORMAL HIGH (ref 70–99)
Glucose-Capillary: 236 mg/dL — ABNORMAL HIGH (ref 70–99)
Glucose-Capillary: 110 mg/dL — ABNORMAL HIGH (ref 70–99)

## 2023-11-20 LAB — SURGICAL PATHOLOGY

## 2023-11-20 NOTE — Progress Notes (Addendum)
 Progress Note    11/20/2023 9:49 AM 4 Days Post-Op  Subjective:  Claudia Mills is a 53 yo female now POD #4 from:   PROCEDURE: left profunda femoral artery to above-the-knee popliteal artery bypass with 6 mm ringed PTFE. Left popliteal to anterior tibial artery bypass with reversed great saphenous vein. Left profunda femoris endarterectomy. Left popliteal artery endarterectomy. Exploration of the peroneal and the posterior tibial arteries with endarterectomy of the posterior tibial   POD #10 From:   PROCEDURE: 1.   Left common femoral endarterectomy and  bovine pericardial patch angioplasty 2.   Left SFA and popliteal artery embolectomy with a 3 Fogarty embolectomy balloon   AND   POD #8 FROM:   Procedure(s) Performed:             1.  Introduction catheter into left lower extremity 3rd order catheter placement              2.  Contrast injection left lower extremity for distal runoff             3.  Percutaneous transluminal angioplasty and stent placement left external iliac artery.             4.  Percutaneous mechanical thrombectomy of the left superficial femoral artery with a combination of 10 mg of tPA and the [place] thrombectomy catheter.                5. Percutaneous transluminal angioplasty left superficial femoral artery.               6.  Celt closure right common femoral arteriotomy   Patient is resting comfortably in bed this morning. Endorses pian to her left leg this morning. This is to be expected. She endorses her toes don't hurt as much today. Endorses her toes feel warmer today.    Vitals:   11/20/23 0401 11/20/23 0808  BP: (!) 167/80 (!) 160/70  Pulse: (!) 107 (!) 105  Resp:  17  Temp: 98.3 F (36.8 C) 98.1 F (36.7 C)  SpO2: 100% 98%   Physical Exam: Cardiac:  RRR, normal S1 and S2.  No murmurs. Lungs: Clear throughout on auscultation but diminished in the bases.  No rales rhonchi or wheezing noted. Incisions: Left groin incision with dressing  clean dry and intact.  No hematoma seroma no. Left Thigh and left calf incisions with dressings clean dry and intact.  Extremities: Bilateral lower extremities warm to touch with good Doppler pulses.  Patient does not have a Doppler pulse in her left dorsalis pedis. This was known prior to surgery.  Abdomen: Positive bowel sounds throughout, soft, nontender nondistended. Neurologic: Alert and oriented x 3, answers questions and follows commands  CBC    Component Value Date/Time   WBC 12.9 (H) 11/17/2023 0418   RBC 2.69 (L) 11/17/2023 0418   HGB 9.3 (L) 11/17/2023 0418   HCT 27.8 (L) 11/17/2023 0418   PLT 182 11/17/2023 0418   MCV 103.3 (H) 11/17/2023 0418   MCH 34.6 (H) 11/17/2023 0418   MCHC 33.5 11/17/2023 0418   RDW 14.1 11/17/2023 0418   LYMPHSABS 2.2 09/20/2021 1849   MONOABS 0.4 09/20/2021 1849   EOSABS 0.1 09/20/2021 1849   BASOSABS 0.1 09/20/2021 1849    BMET    Component Value Date/Time   NA 135 11/19/2023 0304   K 3.3 (L) 11/19/2023 0304   CL 102 11/19/2023 0304   CO2 26 11/19/2023 0304   GLUCOSE 136 (H) 11/19/2023 0304  BUN <5 (L) 11/19/2023 0304   CREATININE 0.35 (L) 11/19/2023 0304   CREATININE 0.58 09/20/2022 0930   CALCIUM  8.0 (L) 11/19/2023 0304   GFRNONAA >60 11/19/2023 0304   GFRAA >60 12/30/2015 2048    INR    Component Value Date/Time   INR 1.0 11/08/2023 1611     Intake/Output Summary (Last 24 hours) at 11/20/2023 0949 Last data filed at 11/19/2023 2353 Gross per 24 hour  Intake 1341.66 ml  Output --  Net 1341.66 ml     Assessment/Plan:  53 y.o. female is s/p SEE ABOVE  4 Days Post-Op   PLAN OOB to the chair today for 4 plus hours a day Continue to work with Physical Therapy and Occupational Therapy All dressings removed today. Nursing to paint all staple line incisions with betadine daily.   Possible discharge to home tomorrow.    DVT prophylaxis:   Lovenox 40 mg daily, ASA 81 mg Daily, Plavix 75 MG Daily.    [name] 11/20/2023 9:49 AM

## 2023-11-20 NOTE — Progress Notes (Signed)
 Physical Therapy Treatment Patient Details Name: Claudia Mills MRN: [id number] DOB: 1970-08-19 Today's Date: 11/20/2023   History of Present Illness Pt is a 53 y/o F with c/o pain and toe discoloration. PMH: L SFA and external iliac stent on 08/08/23. underwent L common femoral endarterectomy, L SFA thrombectomy on 11/09/23. PMH significant for HTN, DM, PAD. s/p vascular surgery 10/2    PT Comments  Pt A&Ox4, agreeable to participate in PT tx and put forth good effort throughout session. Pt was met semi-supine in bed, CGA to achieve sitting with use of bed features. STS from elevated EOB with RW and minA with VC for hand placement. Pt able to tolerate short bout of amb of ~1ft with max multimodal cues for step-to sequencing and RW positioning, minA provided to assist in offloading LLE. Pt was noted to have drainage from LLE surgical site after amb, RN informed and entered room at end of session. Pt was left seated in recliner, all needs in reach. Pt would benefit from additional skilled PT intervention to continue progressing toward goals and maximize independence with functional mobility.     If plan is discharge home, recommend the following: A little help with walking and/or transfers;A little help with bathing/dressing/bathroom;Assistance with cooking/housework;Help with stairs or ramp for entrance;Assist for transportation   Can travel by private vehicle     No  Equipment Recommendations  Other (comment) (TBD at next venue of care)    Recommendations for Other Services       Precautions / Restrictions Precautions Precautions: Fall Recall of Precautions/Restrictions: Intact Restrictions Weight Bearing Restrictions Per Provider Order: No     Mobility  Bed Mobility Overal bed mobility: Needs Assistance Bed Mobility: Supine to Sit     Supine to sit: Contact guard, HOB elevated, Used rails     General bed mobility comments: no physical assistance required, use of bed features,  hesitant to move LLE    Transfers Overall transfer level: Needs assistance Equipment used: Rolling walker (2 wheels) Transfers: Sit to/from Stand Sit to Stand: Min assist, From elevated surface           General transfer comment: STS from elevated EOB with minA, VC for hand placement on RW    Ambulation/Gait Ambulation/Gait assistance: Min assist Gait Distance (Feet): 4 Feet Assistive device: Rolling walker (2 wheels) Gait Pattern/deviations: Step-to pattern, Decreased stance time - left, Decreased weight shift to left, Narrow base of support Gait velocity: decreased     General Gait Details: Pt able to amb ~24ft forward with max multimodal cues for step-to sequencing, minA to assist in offloading LLE   Stairs             Wheelchair Mobility     Tilt Bed    Modified Rankin (Stroke Patients Only)       Balance Overall balance assessment: Needs assistance Sitting-balance support: Feet supported Sitting balance-Leahy Scale: Good Sitting balance - Comments: steady static and dynamic sitting   Standing balance support: Bilateral upper extremity supported, Reliant on assistive device for balance Standing balance-Leahy Scale: Poor Standing balance comment: heavy BUE support on RW, poor weight acceptance on LLE                            Communication Communication Communication: No apparent difficulties  Cognition Arousal: Alert Behavior During Therapy: WFL for tasks assessed/performed   PT - Cognitive impairments: No apparent impairments  PT - Cognition Comments: A&Ox4 Following commands: Intact      Cueing Cueing Techniques: Verbal cues, Visual cues  Exercises      General Comments General comments (skin integrity, edema, etc.): Pt's LLE surgical sites were undressed upon PT entry, pt noted to have drainage dripping down L leg after amb- RN entered room at end of session to address      Pertinent  Vitals/Pain Pain Assessment Pain Assessment: 0-10 Pain Score: 8  Pain Location: LLE, after transfer to recliner Pain Descriptors / Indicators: Grimacing, Aching, Operative site guarding Pain Intervention(s): Limited activity within patient's tolerance, Monitored during session, Repositioned    Home Living                          Prior Function            PT Goals (current goals can now be found in the care plan section) Progress towards PT goals: Progressing toward goals    Frequency    Min 2X/week      PT Plan      Co-evaluation              AM-PAC PT 6 Clicks Mobility   Outcome Measure  Help needed turning from your back to your side while in a flat bed without using bedrails?: None Help needed moving from lying on your back to sitting on the side of a flat bed without using bedrails?: A Little Help needed moving to and from a bed to a chair (including a wheelchair)?: A Little Help needed standing up from a chair using your arms (e.g., wheelchair or bedside chair)?: A Little Help needed to walk in hospital room?: A Lot Help needed climbing 3-5 steps with a railing? : Total 6 Click Score: 16    End of Session Equipment Utilized During Treatment: Gait belt Activity Tolerance: Patient tolerated treatment well;Patient limited by pain Patient left: in chair;with call bell/phone within reach;with nursing/sitter in room (RN notified that pt did not have chair alarm box in room) Nurse Communication: Mobility status PT Visit Diagnosis: Pain;Difficulty in walking, not elsewhere classified (R26.2) Pain - Right/Left: Left Pain - part of body: Leg     Time: 8643-8585 PT Time Calculation (min) (ACUTE ONLY): 18 min  Charges:    $Therapeutic Activity: 8-22 mins PT General Charges $$ ACUTE PT VISIT: 1 Visit                    Janell Axe, SPT

## 2023-11-20 NOTE — Plan of Care (Signed)

## 2023-11-20 NOTE — Progress Notes (Signed)
 Occupational Therapy Treatment Patient Details Name: Claudia Mills MRN: [id number] DOB: December 25, 1970 Today's Date: 11/20/2023   History of present illness Pt is a 53 y/o F with c/o pain and toe discoloration. PMH: L SFA and external iliac stent on 08/08/23. underwent L common femoral endarterectomy, L SFA thrombectomy on 11/09/23. PMH significant for HTN, DM, PAD. s/p vascular surgery 10/2   OT comments  Claudia Mills was seen for OT treatment on this date. Upon arrival to room pt seated in chair, agreeable to tx. Pt reports 8/10 pain and noted increased bleeding at incision sites - RN notified. Pt requires CGA + RW sit>stand from chair, cues to sequence. Pain limiting steps, required MIN A + RW bed>chair steps. SUP don/doff gown in sitting. Pt making good progress toward goals, will continue to follow POC. Discharge recommendation updated to reflect increased assist required and mother unable to provide assist at home.       If plan is discharge home, recommend the following:  A little help with walking and/or transfers;A little help with bathing/dressing/bathroom   Equipment Recommendations  BSC/3in1    Recommendations for Other Services      Precautions / Restrictions Precautions Precautions: Fall Recall of Precautions/Restrictions: Intact Restrictions Weight Bearing Restrictions Per Provider Order: No       Mobility Bed Mobility Overal bed mobility: Needs Assistance Bed Mobility: Sit to Supine       Sit to supine: Min assist   General bed mobility comments: no physical assistance required, use of bed features, hesitant to move LL    Transfers Overall transfer level: Needs assistance Equipment used: Rolling walker (2 wheels) Transfers: Sit to/from Stand, Bed to chair/wheelchair/BSC Sit to Stand: Contact guard assist     Step pivot transfers: Min assist     General transfer comment: cues     Balance Overall balance assessment: Needs assistance Sitting-balance support:  Feet supported Sitting balance-Leahy Scale: Good     Standing balance support: Bilateral upper extremity supported, Reliant on assistive device for balance Standing balance-Leahy Scale: Poor                             ADL either performed or assessed with clinical judgement   ADL Overall ADL's : Needs assistance/impaired                                       General ADL Comments: MAX A don B shoes in sitting     Communication Communication Communication: No apparent difficulties   Cognition Arousal: Alert Behavior During Therapy: WFL for tasks assessed/performed Cognition: No apparent impairments                               Following commands: Intact        Cueing   Cueing Techniques: Verbal cues, Visual cues  Exercises      Shoulder Instructions       General Comments RN notified of bleeding from incision site.    Pertinent Vitals/ Pain       Pain Assessment Pain Assessment: 0-10 Pain Score: 8  Pain Location: LLE, after transfer to recliner Pain Descriptors / Indicators: Grimacing, Aching, Operative site guarding Pain Intervention(s): Limited activity within patient's tolerance, Repositioned, Patient requesting pain meds-RN notified   Frequency  Min 2X/week  Progress Toward Goals  OT Goals(current goals can now be found in the care plan section)  Progress towards OT goals: Progressing toward goals  Acute Rehab OT Goals OT Goal Formulation: With patient Time For Goal Achievement: 12/01/23 Potential to Achieve Goals: Fair ADL Goals Pt Will Perform Grooming: with modified independence;standing Pt Will Perform Lower Body Dressing: with modified independence;sit to/from stand Pt Will Transfer to Toilet: with modified independence;ambulating Pt Will Perform Toileting - Clothing Manipulation and hygiene: with modified independence;sit to/from stand  Plan      Co-evaluation                  AM-PAC OT 6 Clicks Daily Activity     Outcome Measure   Help from another person eating meals?: None Help from another person taking care of personal grooming?: A Little Help from another person toileting, which includes using toliet, bedpan, or urinal?: A Lot Help from another person bathing (including washing, rinsing, drying)?: A Lot Help from another person to put on and taking off regular upper body clothing?: None Help from another person to put on and taking off regular lower body clothing?: A Lot 6 Click Score: 17    End of Session Equipment Utilized During Treatment: Rolling walker (2 wheels)  OT Visit Diagnosis: Unsteadiness on feet (R26.81);Repeated falls (R29.6);Muscle weakness (generalized) (M62.81)   Activity Tolerance Patient limited by pain   Patient Left in bed;with call bell/phone within reach;with bed alarm set   Nurse Communication Mobility status;Patient requests pain meds        Time: 8398-8374 OT Time Calculation (min): 24 min  Charges: OT General Charges $OT Visit: 1 Visit OT Treatments $Self Care/Home Management : 23-37 mins  Elston Slot, M.S. OTR/L  11/20/23, 4:43 PM  ascom [phone]

## 2023-11-20 NOTE — Plan of Care (Signed)
  Problem: Education: Goal: Knowledge of General Education information will improve Description: Including pain rating scale, medication(s)/side effects and non-pharmacologic comfort measures Outcome: Progressing   Problem: Clinical Measurements: Goal: Will remain free from infection Outcome: Progressing   Problem: Nutrition: Goal: Adequate nutrition will be maintained Outcome: Progressing   Problem: Coping: Goal: Level of anxiety will decrease Outcome: Progressing   Problem: Safety: Goal: Ability to remain free from injury will improve Outcome: Progressing

## 2023-11-21 LAB — GLUCOSE, CAPILLARY
Glucose-Capillary: 164 mg/dL — ABNORMAL HIGH (ref 70–99)
Glucose-Capillary: 112 mg/dL — ABNORMAL HIGH (ref 70–99)
Glucose-Capillary: 172 mg/dL — ABNORMAL HIGH (ref 70–99)
Glucose-Capillary: 133 mg/dL — ABNORMAL HIGH (ref 70–99)

## 2023-11-21 NOTE — Progress Notes (Signed)
 Physical Therapy Treatment Patient Details Name: Claudia Mills MRN: [id number] DOB: 26-Sep-1970 Today's Date: 11/21/2023   History of Present Illness Pt is a 53 y/o F with c/o pain and toe discoloration. PMH: L SFA and external iliac stent on 08/08/23. underwent L common femoral endarterectomy, L SFA thrombectomy on 11/09/23. PMH significant for HTN, DM, PAD. s/p vascular surgery 10/2    PT Comments  Pt alert and agreeable to participate in PT tx. Pt was met seated in recliner, upon entry pt's L foot was noted to be swollen, red, and warm to the touch compared to the RLE- RN aware and notified. Session focused on chair-level BLE exercises to pt's tolerance due to pain with WB. Pt able to perform multiple RLE resisted exercises with fair tolerance and able to perform gentle heel slides and hip abduction on LLE, however LLE was much more limited due to pain and fatigue. Pt was left as she was received, all needs in reach. The patient would benefit from further skilled PT intervention to continue to progress towards goals.       If plan is discharge home, recommend the following: A little help with walking and/or transfers;A little help with bathing/dressing/bathroom;Assistance with cooking/housework;Help with stairs or ramp for entrance;Assist for transportation   Can travel by private vehicle     No  Equipment Recommendations  Other (comment) (TBD at next venue of care)    Recommendations for Other Services       Precautions / Restrictions Precautions Precautions: Fall Recall of Precautions/Restrictions: Intact Restrictions Weight Bearing Restrictions Per Provider Order: No     Mobility  Bed Mobility               General bed mobility comments: NT, pt seated in recliner at start/end of session    Transfers                   General transfer comment: NT this session    Ambulation/Gait                   Stairs             Wheelchair Mobility      Tilt Bed    Modified Rankin (Stroke Patients Only)       Balance   Sitting-balance support: Feet supported Sitting balance-Leahy Scale: Good Sitting balance - Comments: steady sitting balance without back support                                    Communication Communication Communication: No apparent difficulties  Cognition Arousal: Alert Behavior During Therapy: WFL for tasks assessed/performed   PT - Cognitive impairments: No apparent impairments                       PT - Cognition Comments: A&Ox4 Following commands: Intact      Cueing Cueing Techniques: Verbal cues, Tactile cues  Exercises Other Exercises Other Exercises: Pt performed 10 reps each of resisted knee extension, knee flexion, hip flexion, and hip abduction on RLE. Performed additional 5 reps of heel slides and hip abduction on LLE    General Comments General comments (skin integrity, edema, etc.): Dressings over LLE surgical incisions dry and intact, L foot noted to be red, swollen, painful to touch- RN aware and notified during session      Pertinent Vitals/Pain Pain Assessment Pain Assessment: 0-10 Pain  Score: 7  Pain Location: LLE Pain Descriptors / Indicators: Aching, Grimacing, Sore Pain Intervention(s): Limited activity within patient's tolerance, Monitored during session, Premedicated before session, Repositioned    Home Living                          Prior Function            PT Goals (current goals can now be found in the care plan section) Progress towards PT goals: Progressing toward goals    Frequency    Min 2X/week      PT Plan      Co-evaluation              AM-PAC PT 6 Clicks Mobility   Outcome Measure  Help needed turning from your back to your side while in a flat bed without using bedrails?: None Help needed moving from lying on your back to sitting on the side of a flat bed without using bedrails?: A Little Help  needed moving to and from a bed to a chair (including a wheelchair)?: A Little Help needed standing up from a chair using your arms (e.g., wheelchair or bedside chair)?: A Little Help needed to walk in hospital room?: A Lot Help needed climbing 3-5 steps with a railing? : Total 6 Click Score: 16    End of Session   Activity Tolerance: Patient limited by pain Patient left: in chair;with call bell/phone within reach;with chair alarm set Nurse Communication: Mobility status (redness and swelling of L foot) PT Visit Diagnosis: Pain;Difficulty in walking, not elsewhere classified (R26.2) Pain - Right/Left: Left Pain - part of body: Leg     Time: 1030-1051 PT Time Calculation (min) (ACUTE ONLY): 21 min  Charges:    $Therapeutic Exercise: 8-22 mins PT General Charges $$ ACUTE PT VISIT: 1 Visit                     Janell Axe, SPT

## 2023-11-21 NOTE — Progress Notes (Signed)
 Progress Note    11/21/2023 11:39 AM 5 Days Post-Op  Subjective:  Claudia Mills is a 53 yo female now POD #5 from:   PROCEDURE: left profunda femoral artery to above-the-knee popliteal artery bypass with 6 mm ringed PTFE. Left popliteal to anterior tibial artery bypass with reversed great saphenous vein. Left profunda femoris endarterectomy. Left popliteal artery endarterectomy. Exploration of the peroneal and the posterior tibial arteries with endarterectomy of the posterior tibial   POD #11 From:   PROCEDURE: 1.   Left common femoral endarterectomy and  bovine pericardial patch angioplasty 2.   Left SFA and popliteal artery embolectomy with a 3 Fogarty embolectomy balloon   AND   POD #9 FROM:   Procedure(s) Performed:             1.  Introduction catheter into left lower extremity 3rd order catheter placement              2.  Contrast injection left lower extremity for distal runoff             3.  Percutaneous transluminal angioplasty and stent placement left external iliac artery.             4.  Percutaneous mechanical thrombectomy of the left superficial femoral artery with a combination of 10 mg of tPA and the [place] thrombectomy catheter.                5. Percutaneous transluminal angioplasty left superficial femoral artery.               6.  Celt closure right common femoral arteriotomy   Patient is resting comfortably in bed this morning. Endorses pian to her left leg this morning. This is to be expected. She endorses her toes don't hurt as much today. Endorses her toes feel warmer today.    Vitals:   11/21/23 0431 11/21/23 0940  BP: (!) 161/102 (!) 176/76  Pulse: 100 98  Resp: 18 14  Temp: 99.6 F (37.6 C) 98.2 F (36.8 C)  SpO2: 98% 100%   Physical Exam: Cardiac:  RRR, normal S1 and S2.  No murmurs. Lungs: Clear throughout on auscultation but diminished in the bases.  No rales rhonchi or wheezing noted. Incisions: Left groin incision with dressing  clean dry and intact.  No hematoma seroma no. Left Thigh and left calf incisions with dressings clean dry and intact.  Extremities: Bilateral lower extremities warm to touch with good Doppler pulses.  Patient does not have a Doppler pulse in her left dorsalis pedis. This was known prior to surgery. Left Great toe and second toe are now discolored but warm to touch. Entire left leg is painful to touch Abdomen: Positive bowel sounds throughout, soft, nontender nondistended. Neurologic: Alert and oriented x 3, answers questions and follows commands  CBC    Component Value Date/Time   WBC 12.9 (H) 11/17/2023 0418   RBC 2.69 (L) 11/17/2023 0418   HGB 9.3 (L) 11/17/2023 0418   HCT 27.8 (L) 11/17/2023 0418   PLT 182 11/17/2023 0418   MCV 103.3 (H) 11/17/2023 0418   MCH 34.6 (H) 11/17/2023 0418   MCHC 33.5 11/17/2023 0418   RDW 14.1 11/17/2023 0418   LYMPHSABS 2.2 09/20/2021 1849   MONOABS 0.4 09/20/2021 1849   EOSABS 0.1 09/20/2021 1849   BASOSABS 0.1 09/20/2021 1849    BMET    Component Value Date/Time   NA 135 11/19/2023 0304   K 3.3 (L) 11/19/2023 0304  CL 102 11/19/2023 0304   CO2 26 11/19/2023 0304   GLUCOSE 136 (H) 11/19/2023 0304   BUN <5 (L) 11/19/2023 0304   CREATININE 0.35 (L) 11/19/2023 0304   CREATININE 0.58 09/20/2022 0930   CALCIUM  8.0 (L) 11/19/2023 0304   GFRNONAA >60 11/19/2023 0304   GFRAA >60 12/30/2015 2048    INR    Component Value Date/Time   INR 1.0 11/08/2023 1611     Intake/Output Summary (Last 24 hours) at 11/21/2023 1139 Last data filed at 11/21/2023 0900 Gross per 24 hour  Intake 483 ml  Output --  Net 483 ml     Assessment/Plan:  53 y.o. female is s/p SEE ABOVE 5 Days Post-Op   PLAN OOB to the chair today for 4 plus hours a day Continue to work with Physical Therapy and Occupational Therapy All wound care by nursing today.  Nursing to paint all staple line incisions with betadine daily.   PT/OT recommendations are discharge to Skilled  Nursing Facility. Will work on getting this set up.    DVT prophylaxis:  Lovenox 40 mg daily, ASA 81 mg Daily, Plavix 75 MG Daily.    [name] [facility] 11/21/2023 11:39 AM

## 2023-11-21 NOTE — [place] FL2 (Signed)
 [place]  MEDICAID FL2 LEVEL OF CARE FORM     IDENTIFICATION  Patient Name: Claudia Mills Birthdate: 1970-11-05 Sex: female Admission Date (Current Location): 11/08/2023  [facility] and [place] Number:  Chiropodist and Address:  [facility], [address], [place], [place] [address]      Provider Number: [id number]  Attending Physician Name and Address:  Jama Cordella MATSU, MD  Relative Name and Phone Number:  Marisa Alverna Rhody)  [phone]    Current Level of Care: Hospital Recommended Level of Care: Skilled Nursing Facility Prior Approval Number:    Date Approved/Denied:   PASRR Number:  [id number]  Discharge Plan: SNF    Current Diagnoses: Patient Active Problem List   Diagnosis Date Noted   Atherosclerosis of artery of extremity with rest pain (HCC) 11/10/2023   Critical limb ischemia of both lower extremities with rest pain (HCC) 11/08/2023   Diabetes (HCC) 08/03/2023   GERD (gastroesophageal reflux disease) 08/03/2023   Peripheral artery disease 07/28/2023   Tobacco use disorder 06/29/2023   Cellulitis of right lower extremity 06/24/2023   Right ankle swelling 06/24/2023   Tick bite of right ankle 06/24/2023   Chronic pain syndrome 05/31/2023   Derangement of left shoulder joint 02/24/2023   Pain in left shoulder 02/14/2023   Lumbar facet arthropathy 12/27/2022   Compression fracture of L1 lumbar vertebra (HCC) 12/27/2022   Chronic bilateral low back pain without sciatica 12/27/2022   Hyperglycemia 10/19/2022   Dehydration 10/19/2022   Nausea and vomiting 10/18/2022   DKA, type 2, not at goal [facility]) 10/18/2022   Collapsed vertebra, not elsewhere classified, lumbar region, subsequent encounter for fracture with routine healing 07/07/2022   Diabetes mellitus type 2, diet-controlled (HCC) 03/16/2022   Pure hypercholesterolemia 03/16/2022   Knee pain, right 02/09/2022   Skin lesion of scalp 02/09/2022   Constipation,  unspecified 03/06/2019   Insomnia, unspecified 03/06/2019   Subcutaneous mass 03/06/2019   Unspecified abnormal findings in urine 03/06/2019   Low back pain, unspecified 03/06/2019   Coronary artery disease 02/06/2019   Essential hypertension 01/31/2019   NSTEMI (non-ST elevated myocardial infarction) (HCC) 01/31/2019   Uncontrolled type 2 diabetes mellitus with hyperglycemia (HCC) 01/31/2019   Unstable angina pectoris (HCC) 01/31/2019   Well adult exam 10/04/2016   Inflamed seborrheic keratosis 12/27/2013   Other hyperlipidemia 11/13/2013   Symptomatic menopausal or female climacteric states 10/05/2013   Allergic rhinitis 07/04/2011   Encounter for other screening for malignant neoplasm of breast 07/04/2011   Benign neoplasm of scalp and skin of neck 08/28/2009   Cannabis abuse 06/16/2009   Nondependent alcohol abuse, episodic drinking behavior 06/16/2009   Type 2 diabetes mellitus with peripheral neuropathy (HCC) 06/16/2009    Orientation RESPIRATION BLADDER Height & Weight     Self, Time, Situation, Place    Continent Weight: 62.6 kg Height:  5' 3 (160 cm)  BEHAVIORAL SYMPTOMS/MOOD NEUROLOGICAL BOWEL NUTRITION STATUS      Continent Diet (Modified Carb)  AMBULATORY STATUS COMMUNICATION OF NEEDS Skin   Limited Assist Verbally                         Personal Care Assistance Level of Assistance  Feeding, Dressing, Bathing Bathing Assistance: Limited assistance Feeding assistance: Independent Dressing Assistance: Limited assistance     Functional Limitations Info             SPECIAL CARE FACTORS FREQUENCY  OT (By licensed OT), PT (By licensed PT)  PT Frequency: 5 X week OT Frequency: 5 x week            Contractures Contractures Info: Not present    Additional Factors Info  Code Status, Allergies Code Status Info: FULL Allergies Info: Bee Venom           Current Medications (11/21/2023):  This is the current hospital active medication  list Current Facility-Administered Medications  Medication Dose Route Frequency Provider Last Rate Last Admin   0.9 %  sodium chloride  infusion (Manually program via Guardrails IV Fluids)   Intravenous Once Schnier, Cordella MATSU, MD       0.9 %  sodium chloride  infusion  500 mL Intravenous Once PRN Schnier, Gregory G, MD       acetaminophen  (TYLENOL ) tablet 325-650 mg  325-650 mg Oral Q4H PRN Schnier, Gregory G, MD   650 mg at 11/18/23 1025   Or   acetaminophen  (TYLENOL ) suppository 325-650 mg  325-650 mg Rectal Q4H PRN Schnier, Gregory G, MD       aspirin  EC tablet 81 mg  81 mg Oral Daily Schnier, Gregory G, MD   81 mg at 11/21/23 9065   atorvastatin  (LIPITOR) tablet 20 mg  20 mg Oral Daily Schnier, Gregory G, MD   20 mg at 11/21/23 0934   clopidogrel (PLAVIX) tablet 75 mg  75 mg Oral Q0600 Schnier, Gregory G, MD   75 mg at 11/21/23 0455   docusate sodium (COLACE) capsule 100 mg  100 mg Oral BID Schnier, Gregory G, MD   100 mg at 11/21/23 9065   DULoxetine  (CYMBALTA ) DR capsule 30 mg  30 mg Oral Daily Schnier, Gregory G, MD   30 mg at 11/21/23 0934   enoxaparin (LOVENOX) injection 40 mg  40 mg Subcutaneous Q24H Schnier, Gregory G, MD   40 mg at 11/21/23 0930   gabapentin  (NEURONTIN ) capsule 300 mg  300 mg Oral TID Schnier, Gregory G, MD   300 mg at 11/21/23 9065   hydrALAZINE  (APRESOLINE ) injection 5 mg  5 mg Intravenous Q20 Min PRN Schnier, Gregory G, MD       HYDROmorphone  (DILAUDID ) injection 0.5-1 mg  0.5-1 mg Intravenous Q2H PRN Schnier, Gregory G, MD   1 mg at 11/20/23 2041   insulin  aspart (novoLOG ) injection 0-15 Units  0-15 Units Subcutaneous TID WC Schnier, Gregory G, MD   3 Units at 11/21/23 9070   insulin  aspart (novoLOG ) injection 0-5 Units  0-5 Units Subcutaneous QHS Schnier, Cordella MATSU, MD   2 Units at 11/14/23 2218   labetalol  (NORMODYNE ) injection 10 mg  10 mg Intravenous Q10 min PRN Schnier, Gregory G, MD   10 mg at 11/16/23 1635   lisinopril  (ZESTRIL ) tablet 20 mg  20 mg Oral  Daily Schnier, Gregory G, MD   20 mg at 11/21/23 9065   magnesium  hydroxide (MILK OF MAGNESIA) suspension 5 mL  5 mL Oral Daily PRN Schnier, Cordella MATSU, MD   5 mL at 11/19/23 1317   metoprolol  tartrate (LOPRESSOR ) injection 2.5-5 mg  2.5-5 mg Intravenous Q2H PRN Schnier, Gregory G, MD       nitroGLYCERIN  (NITROSTAT ) SL tablet 0.4 mg  0.4 mg Sublingual Q5 min PRN Schnier, Cordella MATSU, MD       nitroGLYCERIN  50 mg in dextrose  5 % 250 mL (0.2 mg/mL) infusion  5-250 mcg/min Intravenous Titrated Schnier, Cordella MATSU, MD       ondansetron  (ZOFRAN ) injection 4 mg  4 mg Intravenous Q6H PRN Schnier, Cordella MATSU, MD  4 mg at 11/16/23 1748   oxyCODONE  (Oxy IR/ROXICODONE ) immediate release tablet 5-10 mg  5-10 mg Oral Q4H PRN Schnier, Gregory G, MD   10 mg at 11/21/23 0940   pantoprazole  (PROTONIX ) EC tablet 40 mg  40 mg Oral Daily Schnier, Gregory G, MD   40 mg at 11/21/23 0934   phenol (CHLORASEPTIC) mouth spray 1 spray  1 spray Mouth/Throat PRN Schnier, Cordella MATSU, MD       polyethylene glycol (MIRALAX / GLYCOLAX) packet 17 g  17 g Oral Daily Schnier, Gregory G, MD   17 g at 11/20/23 0901   polyethylene glycol (MIRALAX / GLYCOLAX) packet 17 g  17 g Oral Daily PRN Schnier, Gregory G, MD   17 g at 11/19/23 1317   potassium chloride  SA (KLOR-CON  M) CR tablet 40-60 mEq  40-60 mEq Oral Daily PRN Schnier, Gregory G, MD       senna-docusate (Senokot-S) tablet 1 tablet  1 tablet Oral QHS PRN Schnier, Gregory G, MD       sodium chloride  flush (NS) 0.9 % injection 3 mL  3 mL Intravenous Q12H Schnier, Cordella MATSU, MD   3 mL at 11/21/23 0936   sodium chloride  flush (NS) 0.9 % injection 3 mL  3 mL Intravenous PRN Schnier, Cordella MATSU, MD       sodium chloride  flush (NS) 0.9 % injection 3 mL  3 mL Intravenous Q12H Schnier, Cordella MATSU, MD   3 mL at 11/21/23 0935   sodium chloride  flush (NS) 0.9 % injection 3 mL  3 mL Intravenous PRN Schnier, Gregory G, MD       sorbitol 70 % solution 30 mL  30 mL Oral Daily PRN Schnier, Gregory G, MD          Discharge Medications: Please see discharge summary for a list of discharge medications.  Relevant Imaging Results:  Relevant Lab Results:   Additional Information SSN    [id number]  [name], RN

## 2023-11-21 NOTE — Plan of Care (Signed)
  Problem: Education: Goal: Knowledge of General Education information will improve Description: Including pain rating scale, medication(s)/side effects and non-pharmacologic comfort measures Outcome: Progressing   Problem: Health Behavior/Discharge Planning: Goal: Ability to manage health-related needs will improve Outcome: Progressing   Problem: Clinical Measurements: Goal: Ability to maintain clinical measurements within normal limits will improve Outcome: Progressing Goal: Will remain free from infection Outcome: Progressing Goal: Diagnostic test results will improve Outcome: Progressing Goal: Respiratory complications will improve Outcome: Progressing Goal: Cardiovascular complication will be avoided Outcome: Progressing   Problem: Activity: Goal: Risk for activity intolerance will decrease Outcome: Progressing   Problem: Nutrition: Goal: Adequate nutrition will be maintained Outcome: Progressing   Problem: Coping: Goal: Level of anxiety will decrease Outcome: Progressing   Problem: Elimination: Goal: Will not experience complications related to bowel motility Outcome: Progressing Goal: Will not experience complications related to urinary retention Outcome: Progressing   Problem: Safety: Goal: Ability to remain free from injury will improve Outcome: Progressing   Problem: Pain Managment: Goal: General experience of comfort will improve and/or be controlled Outcome: Progressing   Problem: Skin Integrity: Goal: Risk for impaired skin integrity will decrease Outcome: Progressing   Problem: Education: Goal: Ability to describe self-care measures that may prevent or decrease complications (Diabetes Survival Skills Education) will improve Outcome: Progressing Goal: Individualized Educational Video(s) Outcome: Progressing   Problem: Fluid Volume: Goal: Ability to maintain a balanced intake and output will improve Outcome: Progressing   Problem: Health  Behavior/Discharge Planning: Goal: Ability to identify and utilize available resources and services will improve Outcome: Progressing Goal: Ability to manage health-related needs will improve Outcome: Progressing

## 2023-11-21 NOTE — Plan of Care (Signed)
  Problem: Education: Goal: Knowledge of General Education information will improve Description: Including pain rating scale, medication(s)/side effects and non-pharmacologic comfort measures Outcome: Progressing   Problem: Clinical Measurements: Goal: Ability to maintain clinical measurements within normal limits will improve Outcome: Progressing Goal: Will remain free from infection Outcome: Progressing   Problem: Nutrition: Goal: Adequate nutrition will be maintained Outcome: Progressing   Problem: Skin Integrity: Goal: Risk for impaired skin integrity will decrease Outcome: Progressing

## 2023-11-21 NOTE — TOC Progression Note (Addendum)
 Transition of Care [facility]) - Progression Note    Patient Details  Name: Claudia Mills MRN: [id number] Date of Birth: [birth year]  [facility]) CM/SW Contact  [name], RN Phone Number: 11/21/2023, 12:39 PM  Clinical Narrative:    Therapy recs are for SNF for STR. TOC met with the patient in the room and she is in agreement with STR. The patient doesn't have a preference for facilities. TOC will give her the bed offers and then she will decide. FL2 sent out in [place]  TOC will continue to follow  15:28 TOC reached out to [place] at [facility] to ask him to eval for a STR. TOC is awaiting f/u.   Barriers to Discharge: Continued Medical Work up               Expected Discharge Plan and Services                                               Social Drivers of Health (SDOH) Interventions SDOH Screenings   Food Insecurity: No Food Insecurity (10/19/2022)  Housing: Low Risk  (10/19/2022)  Transportation Needs: No Transportation Needs (10/19/2022)  Utilities: Not At Risk (10/19/2022)  Depression (PHQ2-9): Low Risk  (09/12/2023)  Tobacco Use: Low Risk  (11/09/2023)    Readmission Risk Interventions     No data to display

## 2023-11-22 LAB — GLUCOSE, CAPILLARY
Glucose-Capillary: 161 mg/dL — ABNORMAL HIGH (ref 70–99)
Glucose-Capillary: 149 mg/dL — ABNORMAL HIGH (ref 70–99)
Glucose-Capillary: 166 mg/dL — ABNORMAL HIGH (ref 70–99)
Glucose-Capillary: 207 mg/dL — ABNORMAL HIGH (ref 70–99)
Glucose-Capillary: 216 mg/dL — ABNORMAL HIGH (ref 70–99)

## 2023-11-22 NOTE — TOC Progression Note (Signed)
 Transition of Care [facility]) - Progression Note    Patient Details  Name: Claudia Mills MRN: [id number] Date of Birth: Oct 15, 1970  Transition of Care [facility]) CM/SW Contact  [name], RN Phone Number: 11/22/2023, 11:37 AM  Clinical Narrative:      No bed offers in  co, bed search extended to [place] and [facility].   Barriers to Discharge: Continued Medical Work up               Expected Discharge Plan and Services                                               Social Drivers of Health (SDOH) Interventions SDOH Screenings   Food Insecurity: No Food Insecurity (10/19/2022)  Housing: Low Risk  (10/19/2022)  Transportation Needs: No Transportation Needs (10/19/2022)  Utilities: Not At Risk (10/19/2022)  Depression (PHQ2-9): Low Risk  (09/12/2023)  Tobacco Use: Low Risk  (11/09/2023)    Readmission Risk Interventions     No data to display

## 2023-11-22 NOTE — Progress Notes (Signed)
 Progress Note    11/22/2023 3:47 PM 6 Days Post-Op  Subjective:  Claudia Mills is a 52 yo female now POD #6 from:   PROCEDURE: left profunda femoral artery to above-the-knee popliteal artery bypass with 6 mm ringed PTFE. Left popliteal to anterior tibial artery bypass with reversed great saphenous vein. Left profunda femoris endarterectomy. Left popliteal artery endarterectomy. Exploration of the peroneal and the posterior tibial arteries with endarterectomy of the posterior tibial   POD #12 From:   PROCEDURE: 1.   Left common femoral endarterectomy and  bovine pericardial patch angioplasty 2.   Left SFA and popliteal artery embolectomy with a 3 Fogarty embolectomy balloon   AND   POD #10 FROM:   Procedure(s) Performed:             1.  Introduction catheter into left lower extremity 3rd order catheter placement              2.  Contrast injection left lower extremity for distal runoff             3.  Percutaneous transluminal angioplasty and stent placement left external iliac artery.             4.  Percutaneous mechanical thrombectomy of the left superficial femoral artery with a combination of 10 mg of tPA and the [place] thrombectomy catheter.                5. Percutaneous transluminal angioplasty left superficial femoral artery.               6.  Celt closure right common femoral arteriotomy   Patient is resting comfortably in bed this morning. Endorses pian to her left leg this morning. This is to be expected. She endorses her toes don't hurt as much today. Endorses her toes feel warmer today.    Vitals:   11/22/23 0436 11/22/23 0759  BP: 137/70 (!) 157/76  Pulse: 100 99  Resp: 16 18  Temp: 98.3 F (36.8 C) 98.5 F (36.9 C)  SpO2: 100% 100%   Physical Exam: Cardiac:  RRR, normal S1 and S2.  No murmurs. Lungs: Clear throughout on auscultation but diminished in the bases.  No rales rhonchi or wheezing noted. Incisions: Left groin incision with dressing clean  dry and intact.  No hematoma seroma no. Left Thigh and left calf incisions with dressings clean dry and intact.  Extremities: Bilateral lower extremities warm to touch with good Doppler pulses.  Patient does not have a Doppler pulse in her left dorsalis pedis. This was known prior to surgery. Left Great toe and second toe are now discolored but warm to touch. Entire left leg is painful to touch Abdomen: Positive bowel sounds throughout, soft, nontender nondistended. Neurologic: Alert and oriented x 3, answers questions and follows commands  CBC    Component Value Date/Time   WBC 12.9 (H) 11/17/2023 0418   RBC 2.69 (L) 11/17/2023 0418   HGB 9.3 (L) 11/17/2023 0418   HCT 27.8 (L) 11/17/2023 0418   PLT 182 11/17/2023 0418   MCV 103.3 (H) 11/17/2023 0418   MCH 34.6 (H) 11/17/2023 0418   MCHC 33.5 11/17/2023 0418   RDW 14.1 11/17/2023 0418   LYMPHSABS 2.2 09/20/2021 1849   MONOABS 0.4 09/20/2021 1849   EOSABS 0.1 09/20/2021 1849   BASOSABS 0.1 09/20/2021 1849    BMET    Component Value Date/Time   NA 135 11/19/2023 0304   K 3.3 (L) 11/19/2023 0304   CL  102 11/19/2023 0304   CO2 26 11/19/2023 0304   GLUCOSE 136 (H) 11/19/2023 0304   BUN <5 (L) 11/19/2023 0304   CREATININE 0.35 (L) 11/19/2023 0304   CREATININE 0.58 09/20/2022 0930   CALCIUM  8.0 (L) 11/19/2023 0304   GFRNONAA >60 11/19/2023 0304   GFRAA >60 12/30/2015 2048    INR    Component Value Date/Time   INR 1.0 11/08/2023 1611     Intake/Output Summary (Last 24 hours) at 11/22/2023 1547 Last data filed at 11/22/2023 1300 Gross per 24 hour  Intake 720 ml  Output --  Net 720 ml     Assessment/Plan:  53 y.o. female is s/p SEE ABOVE  6 Days Post-Op   PLAN Strong Doppler PT/DP in left lower extremity this morning.  OOB to the chair today for 4 plus hours a day Continue to work with Physical Therapy and Occupational Therapy All wound care by nursing today.  Nursing to paint all staple line incisions with betadine  daily.   PT/OT recommendations are discharge to Skilled Nursing Facility. Will work on getting this set up.  DVT prophylaxis:  Lovenox 40 mg daily, ASA 81 mg Daily, Plavix 75 MG Daily.    Claudia Mills [facility] 11/22/2023 3:47 PM

## 2023-11-22 NOTE — TOC Progression Note (Signed)
 Transition of Care [facility]) - Progression Note    Patient Details  Name: Claudia Mills MRN: [id number] Date of Birth: 1970/02/22  Transition of Care [facility]) CM/SW Contact  [name], RN Phone Number: 11/22/2023, 2:13 PM  Clinical Narrative:     Patient with a bed offer from [facility] in [place], [facility] in [place] is considering but has not committed. TOC spoke with the patient in the room and she is in agreement with [facility]. TOC selected [facility] in the hub and placed a call to [facility] to accept the bed.[name] will start ins auth today.    Barriers to Discharge: Insurance Authorization               Expected Discharge Plan and Services                                               Social Drivers of Health (SDOH) Interventions SDOH Screenings   Food Insecurity: No Food Insecurity (10/19/2022)  Housing: Low Risk  (10/19/2022)  Transportation Needs: No Transportation Needs (10/19/2022)  Utilities: Not At Risk (10/19/2022)  Depression (PHQ2-9): Low Risk  (09/12/2023)  Tobacco Use: Low Risk  (11/09/2023)    Readmission Risk Interventions     No data to display

## 2023-11-22 NOTE — Plan of Care (Signed)
 Patient discharged.

## 2023-11-23 DIAGNOSIS — I739 Peripheral vascular disease, unspecified: Secondary | ICD-10-CM | POA: Diagnosis not present

## 2023-11-23 DIAGNOSIS — E1151 Type 2 diabetes mellitus with diabetic peripheral angiopathy without gangrene: Secondary | ICD-10-CM | POA: Diagnosis not present

## 2023-11-23 DIAGNOSIS — I252 Old myocardial infarction: Secondary | ICD-10-CM | POA: Diagnosis not present

## 2023-11-23 DIAGNOSIS — D72829 Elevated white blood cell count, unspecified: Secondary | ICD-10-CM | POA: Diagnosis not present

## 2023-11-23 DIAGNOSIS — Z7902 Long term (current) use of antithrombotics/antiplatelets: Secondary | ICD-10-CM | POA: Diagnosis not present

## 2023-11-23 DIAGNOSIS — I700 Atherosclerosis of aorta: Secondary | ICD-10-CM | POA: Diagnosis not present

## 2023-11-23 DIAGNOSIS — Z95828 Presence of other vascular implants and grafts: Secondary | ICD-10-CM

## 2023-11-23 LAB — GLUCOSE, CAPILLARY
Glucose-Capillary: 195 mg/dL — ABNORMAL HIGH (ref 70–99)
Glucose-Capillary: 165 mg/dL — ABNORMAL HIGH (ref 70–99)

## 2023-11-23 LAB — CREATININE, SERUM
Creatinine, Ser: 0.45 mg/dL (ref 0.44–1.00)
GFR, Estimated: >60 mL/min (ref >60)

## 2023-11-23 MED ORDER — ATORVASTATIN CALCIUM 80 MG PO TABS: 80.0000 mg | ORAL_TABLET | Freq: Every day | ORAL | 11 refills | Status: AC

## 2023-11-23 MED ORDER — OXYCODONE HCL 5 MG PO TABS: 5.0000 mg | ORAL_TABLET | ORAL | 0 refills | Status: DC | PRN

## 2023-11-23 MED ORDER — CLOPIDOGREL BISULFATE 75 MG PO TABS: 75.0000 mg | ORAL_TABLET | Freq: Every day | ORAL | 11 refills | Status: AC

## 2023-11-23 MED ORDER — ASPIRIN 81 MG PO TBEC: 81.0000 mg | DELAYED_RELEASE_TABLET | Freq: Every day | ORAL | 12 refills | Status: AC

## 2023-11-23 NOTE — Progress Notes (Signed)
 Occupational Therapy Treatment Patient Details Name: Claudia Mills MRN: [id number] DOB: [birth year] Today's Date: 11/23/2023   History of present illness Pt is a 53 y/o F with c/o pain and toe discoloration. PMH: L SFA and external iliac stent on 08/08/23. underwent L common femoral endarterectomy, L SFA thrombectomy on 11/09/23. PMH significant for HTN, DM, PAD. s/p vascular surgery 10/2   OT comments  Ms Looper was seen for OT treatment on this date. Upon arrival to room pt in bed, agreeable to tx. Pt requires SBA + RW for BSC t/f and standing functional balance task. Educated on d/c recs, DME recs, and HEP. Pt making good progress toward goals, will continue to follow POC. Discharge recommendation remains appropriate.       If plan is discharge home, recommend the following:  A little help with walking and/or transfers;A little help with bathing/dressing/bathroom   Equipment Recommendations  BSC/3in1    Recommendations for Other Services      Precautions / Restrictions Precautions Precautions: Fall Recall of Precautions/Restrictions: Intact Restrictions Weight Bearing Restrictions Per Provider Order: No       Mobility Bed Mobility Overal bed mobility: Modified Independent                  Transfers Overall transfer level: Needs assistance Equipment used: Rolling walker (2 wheels) Transfers: Sit to/from Stand, Bed to chair/wheelchair/BSC Sit to Stand: Contact guard assist     Step pivot transfers: Contact guard assist           Balance Overall balance assessment: Needs assistance Sitting-balance support: Feet supported Sitting balance-Leahy Scale: Good     Standing balance support: No upper extremity supported, During functional activity Standing balance-Leahy Scale: Poor                             ADL either performed or assessed with clinical judgement   ADL Overall ADL's : Needs assistance/impaired                                        General ADL Comments: SBA + RW for BSC t/f and standing functional balance task     Communication Communication Communication: No apparent difficulties   Cognition Arousal: Alert Behavior During Therapy: WFL for tasks assessed/performed Cognition: No apparent impairments                                                      Pertinent Vitals/ Pain       Pain Assessment Pain Assessment: 0-10 Pain Score: 7  Pain Location: LLE Pain Descriptors / Indicators: Aching, Grimacing, Sore Pain Intervention(s): Limited activity within patient's tolerance, Repositioned   Frequency  Min 2X/week        Progress Toward Goals  OT Goals(current goals can now be found in the care plan section)  Progress towards OT goals: Progressing toward goals  Acute Rehab OT Goals OT Goal Formulation: With patient Time For Goal Achievement: 12/01/23 Potential to Achieve Goals: Fair ADL Goals Pt Will Perform Grooming: with modified independence;standing Pt Will Perform Lower Body Dressing: with modified independence;sit to/from stand Pt Will Transfer to Toilet: with modified independence;ambulating Pt Will Perform Toileting - Clothing Manipulation and hygiene: with  modified independence;sit to/from stand  Plan      Co-evaluation                 AM-PAC OT 6 Clicks Daily Activity     Outcome Measure   Help from another person eating meals?: None Help from another person taking care of personal grooming?: A Little Help from another person toileting, which includes using toliet, bedpan, or urinal?: A Lot Help from another person bathing (including washing, rinsing, drying)?: A Lot Help from another person to put on and taking off regular upper body clothing?: None Help from another person to put on and taking off regular lower body clothing?: A Lot 6 Click Score: 17    End of Session Equipment Utilized During Treatment: Rolling walker (2 wheels)  OT  Visit Diagnosis: Unsteadiness on feet (R26.81);Repeated falls (R29.6);Muscle weakness (generalized) (M62.81)   Activity Tolerance Patient tolerated treatment well   Patient Left in chair;with call bell/phone within reach   Nurse Communication          Time: 8878-8857 OT Time Calculation (min): 21 min  Charges: OT General Charges $OT Visit: 1 Visit OT Treatments $Self Care/Home Management : 8-22 mins  Elston Slot, M.S. OTR/L  11/23/23, 12:01 PM  ascom [phone]

## 2023-11-23 NOTE — Progress Notes (Signed)
 Physical Therapy Treatment Patient Details Name: Claudia Mills MRN: [id number] DOB: [birth year] Today's Date: 11/23/2023   History of Present Illness Pt is a 53 y/o F with c/o pain and toe discoloration. PMH: L SFA and external iliac stent on 08/08/23. underwent L common femoral endarterectomy, L SFA thrombectomy on 11/09/23. PMH significant for HTN, DM, PAD. s/p vascular surgery 10/2    PT Comments  Pt was pleasant and with min encouragement and education on benefits of mobility and activity progression pt participated and put forth good effort throughout the session.  Pt was taken through below therex with cuing for technique and intensity and demonstrated good muscle activation and fair joint AROM on the LLE with RLE being WNL.  Pt remained antalgic with LLE weight bearing but grossly improved compared to prior session with pt able to amb 1 x 8 feet and 1 x 6 feet with no physical assistance needed.  Cuing was provided on proper step-to sequencing to address pain with LLE weight bearing.  Pt will benefit from continued PT services upon discharge to safely address deficits listed in patient problem list for decreased caregiver assistance and eventual return to PLOF.      If plan is discharge home, recommend the following: A little help with walking and/or transfers;A little help with bathing/dressing/bathroom;Assistance with cooking/housework;Help with stairs or ramp for entrance;Assist for transportation   Can travel by private vehicle     No  Equipment Recommendations  Other (comment) (TBD at next venue of care)    Recommendations for Other Services       Precautions / Restrictions Precautions Precautions: Fall Recall of Precautions/Restrictions: Intact Restrictions Weight Bearing Restrictions Per Provider Order: No     Mobility  Bed Mobility               General bed mobility comments: NT, pt seated in recliner at start/end of session    Transfers Overall transfer level:  Needs assistance Equipment used: Rolling walker (2 wheels) Transfers: Sit to/from Stand Sit to Stand: Contact guard assist           General transfer comment: Min to mod verbal cues for hand placement with significant effort needed to come to standing along with UE assist from the armrests but no physical assist required    Ambulation/Gait Ambulation/Gait assistance: Contact guard assist Gait Distance (Feet): 8 Feet x 1, 6 Feet x 1 Assistive device: Rolling walker (2 wheels) Gait Pattern/deviations: Step-to pattern, Decreased stance time - left, Decreased step length - right, Antalgic Gait velocity: decreased     General Gait Details: Mod multi-modal cues for step-to sequencing to address LLE pain with weight bearing; generally steady with no overt LOB   Stairs             Wheelchair Mobility     Tilt Bed    Modified Rankin (Stroke Patients Only)       Balance Overall balance assessment: Needs assistance Sitting-balance support: Feet supported Sitting balance-Leahy Scale: Good     Standing balance support: During functional activity, Bilateral upper extremity supported, Reliant on assistive device for balance Standing balance-Leahy Scale: Fair                              Hotel manager: No apparent difficulties  Cognition Arousal: Alert Behavior During Therapy: WFL for tasks assessed/performed   PT - Cognitive impairments: No apparent impairments  PT - Cognition Comments: A&Ox4 Following commands: Intact      Cueing Cueing Techniques: Verbal cues, Tactile cues  Exercises Total Joint Exercises Ankle Circles/Pumps: AROM, Strengthening, Both, 5 reps, 10 reps Quad Sets: Strengthening, Both, 5 reps, 10 reps Gluteal Sets: Strengthening, Both, 10 reps Long Arc Quad: AROM, Strengthening, Both, 10 reps Knee Flexion: AROM, Strengthening, Both, 10 reps Marching in Standing: AROM,  Strengthening, Both, 5 reps, Standing Other Exercises Other Exercises: HEP education for BLE AP, QS, GS, and LAQs x 10 each every 1-2 hours daily Other Exercises: Pt education provided on physiological benefits of activity and general principles of activity progression    General Comments        Pertinent Vitals/Pain Pain Assessment Pain Assessment: 0-10 Pain Score: 4  Pain Location: LLE Pain Descriptors / Indicators: Aching, Sore Pain Intervention(s): Premedicated before session, Monitored during session    Home Living                          Prior Function            PT Goals (current goals can now be found in the care plan section) Progress towards PT goals: Progressing toward goals    Frequency    Min 2X/week      PT Plan      Co-evaluation              AM-PAC PT 6 Clicks Mobility   Outcome Measure  Help needed turning from your back to your side while in a flat bed without using bedrails?: None Help needed moving from lying on your back to sitting on the side of a flat bed without using bedrails?: A Little Help needed moving to and from a bed to a chair (including a wheelchair)?: A Little Help needed standing up from a chair using your arms (e.g., wheelchair or bedside chair)?: A Little Help needed to walk in hospital room?: A Lot Help needed climbing 3-5 steps with a railing? : Total 6 Click Score: 16    End of Session Equipment Utilized During Treatment: Gait belt Activity Tolerance: Patient limited by pain Patient left: in chair;with call bell/phone within reach;Other (comment) (Pt with chair alarm pad in place but no box to plug into, nsg aware) Nurse Communication: Mobility status PT Visit Diagnosis: Pain;Difficulty in walking, not elsewhere classified (R26.2) Pain - Right/Left: Left Pain - part of body: Leg     Time: 8483-8460 PT Time Calculation (min) (ACUTE ONLY): 23 min  Charges:    $Gait Training: 8-22  mins $Therapeutic Exercise: 8-22 mins PT General Charges $$ ACUTE PT VISIT: 1 Visit                     D. Scott Fahmida Jurich PT, DPT 11/23/23, 4:02 PM

## 2023-11-23 NOTE — Discharge Summary (Signed)
 [facility]    Discharge Summary    Patient ID:  Claudia Mills MRN: [id number] DOB/AGE: 08/24/1970 53 y.o.  Admit date: 11/08/2023 Discharge date: 11/23/2023 Date of Surgery: 11/16/2023 Surgeon: Surgeon(s): Schnier, Cordella MATSU, MD Marea Selinda RAMAN, MD  Admission Diagnosis: Atherosclerosis of artery of extremity with rest pain [facility]) [I70.229] Critical limb ischemia of both lower extremities with rest pain [facility]) [I70.223]  Discharge Diagnoses:  Atherosclerosis of artery of extremity with rest pain (HCC) [I70.229] Critical limb ischemia of both lower extremities with rest pain (HCC) [I70.223]  Secondary Diagnoses: Past Medical History:  Diagnosis Date   Coronary artery disease    Diabetes mellitus without complication (HCC)    Hypertension    MI (myocardial infarction) (HCC)    PVD (peripheral vascular disease)    Tick bite    Pt states she does not remember daye of tic bite, but that is was since last visit with pain clinic and she was hospitalized for 1 day.    Procedure(s): APPLICATION OF CELL SAVER CREATION, BYPASS, ARTERIAL, POPLITEAL ENDARTERECTOMY, FEMORAL ENDARTERECTOMY, POPLITEAL ENDARTERECTOMY TIBIOPERONEAL (CATHLAB) BYPASS GRAFT POPLITEAL TO TIBIAL (CATHLAB)  Discharged Condition: good  HPI:  Claudia Mills is a 53 y.o. female who presents with ischemic rest pain of her left foot.  She has now undergone multiple interventions that have not yielded symptom relief.  At this point open bypass surgery has been recommended.   Hospital Course:  [name] is a 53 y.o. female is S/P Left [name] is a 53 yo female now POD #6 from:   PROCEDURE: left profunda femoral artery to above-the-knee popliteal artery bypass with 6 mm ringed PTFE. Left popliteal to anterior tibial artery bypass with reversed great saphenous vein. Left profunda femoris endarterectomy. Left popliteal artery endarterectomy. Exploration of the peroneal and the posterior  tibial arteries with endarterectomy of the posterior tibial   POD #12 From:   PROCEDURE: 1.   Left common femoral endarterectomy and  bovine pericardial patch angioplasty 2.   Left SFA and popliteal artery embolectomy with a 3 Fogarty embolectomy balloon   AND   POD #10 FROM:   Procedure(s) Performed:             1.  Introduction catheter into left lower extremity 3rd order catheter placement              2.  Contrast injection left lower extremity for distal runoff             3.  Percutaneous transluminal angioplasty and stent placement left external iliac artery.             4.  Percutaneous mechanical thrombectomy of the left superficial femoral artery with a combination of 10 mg of tPA and the [place] thrombectomy catheter.                5. Percutaneous transluminal angioplasty left superficial femoral artery.               6.  Celt closure right common femoral arteriotomy   Patient is resting comfortably in bed this morning. Endorses pian to her left leg this morning. This is to be expected. She endorses her toes don't hurt as much today. Endorses her toes feel warmer today. Patient recovering as expected and will be discharged to skilled nursing today.   Patient is being discharged on aspirin  81 mg daily, Plavix 75 mg daily and atorvastatin  80 mg daily.  Patient was instructed and counseled not  to skip or miss taking any his medications as it will alter the outcome of her surgery.  Patient verbalizes her understanding.  I spent greater than 60 minutes in developing, implementing, teaching and discharging this patient today.  Extubated: POD # 0 Physical Exam:  Alert notes x3, no acute distress Face: Symmetrical.  Tongue is midline. Neck: Trachea is midline.  No swelling or bruising. Cardiovascular: Regular rate and rhythm Pulmonary: Clear to auscultation bilaterally Abdomen: Soft, nontender, nondistended Right groin access: Clean dry and intact.  No swelling or drainage  noted Left groin access: Clean dry and intact.  No swelling or drainage noted Left lower extremity: Thigh soft.  Calf soft.  Extremities warm distally toes.  Hard to palpate pedal pulses however the foot is warm strong doppler DP/PT pulses with good capillary refill. Right lower extremity: Thigh soft.  Calf soft.  Extremities warm distally toes.  Hard to palpate pedal pulses however the foot is warm is her good capillary refill. Neurological: No deficits noted   Post-op wounds:  clean, dry, intact or healing well. Multiple staple line incisions. Clean with warm soap and water  daily. Completely dry and paint with betadine daily.   Pt. Ambulating, voiding and taking PO diet without difficulty. Pt pain controlled with PO pain meds.  Labs:  As below  Complications: none  Consults:    Significant Diagnostic Studies: CBC Lab Results  Component Value Date   WBC 12.9 (H) 11/17/2023   HGB 9.3 (L) 11/17/2023   HCT 27.8 (L) 11/17/2023   MCV 103.3 (H) 11/17/2023   PLT 182 11/17/2023    BMET    Component Value Date/Time   NA 135 11/19/2023 0304   K 3.3 (L) 11/19/2023 0304   CL 102 11/19/2023 0304   CO2 26 11/19/2023 0304   GLUCOSE 136 (H) 11/19/2023 0304   BUN <5 (L) 11/19/2023 0304   CREATININE 0.45 11/23/2023 0516   CREATININE 0.58 09/20/2022 0930   CALCIUM  8.0 (L) 11/19/2023 0304   GFRNONAA >60 11/23/2023 0516   GFRAA >60 12/30/2015 2048   COAG Lab Results  Component Value Date   INR 1.0 11/08/2023   INR 0.9 09/20/2022     Disposition:  Discharge to :Skilled nursing facility  Allergies as of 11/23/2023       Reactions   Bee Venom Anaphylaxis        Medication List     STOP taking these medications    ibuprofen 800 MG tablet Commonly known as: ADVIL       TAKE these medications    aspirin  EC 81 MG tablet Take 1 tablet (81 mg total) by mouth daily. Swallow whole. Start taking on: November 24, 2023   atorvastatin  80 MG tablet Commonly known as:  LIPITOR Take 80 mg by mouth daily. What changed: Another medication with the same name was added. Make sure you understand how and when to take each.   atorvastatin  80 MG tablet Commonly known as: Lipitor Take 1 tablet (80 mg total) by mouth daily. What changed: You were already taking a medication with the same name, and this prescription was added. Make sure you understand how and when to take each.   Brilinta  90 MG Tabs tablet Generic drug: ticagrelor  Take 90 mg by mouth 2 (two) times daily.   clopidogrel 75 MG tablet Commonly known as: PLAVIX Take 1 tablet (75 mg total) by mouth daily at 6 (six) AM. Start taking on: November 24, 2023   cyclobenzaprine  5 MG tablet Commonly known  as: FLEXERIL  Take 1 tablet (5 mg total) by mouth 3 (three) times daily as needed for muscle spasms.   DULoxetine  30 MG capsule Commonly known as: CYMBALTA  Take 30 mg by mouth daily.   empagliflozin 25 MG Tabs tablet Commonly known as: JARDIANCE Take 25 mg by mouth daily.   gabapentin  300 MG capsule Commonly known as: NEURONTIN  Take 300 mg by mouth 3 (three) times daily.   glipiZIDE  5 MG tablet Commonly known as: GLUCOTROL  Take 1 tablet (5 mg total) by mouth daily before breakfast.   lisinopril  20 MG tablet Commonly known as: ZESTRIL  Take 1 tablet (20 mg total) by mouth daily.   nitroGLYCERIN  0.3 MG SL tablet Commonly known as: NITROSTAT  Place 0.3 mg under the tongue every 5 (five) minutes as needed for chest pain.   oxyCODONE  5 MG immediate release tablet Commonly known as: Oxy IR/ROXICODONE  Take 1-2 tablets (5-10 mg total) by mouth every 4 (four) hours as needed for moderate pain (pain score 4-6). What changed:  how much to take when to take this reasons to take this   pantoprazole  40 MG tablet Commonly known as: PROTONIX  Take 1 tablet (40 mg total) by mouth daily.       Verbal and written Discharge instructions given to the patient. Wound care per Discharge AVS  Contact  information for follow-up providers     Aycock, Ngwe A, MD Follow up.   Specialty: Family Medicine Why: hospital follow up Contact information: [address] [place] [address] [phone]         Delores Orvin BRAVO, NP Follow up in 3 week(s).   Specialty: Vascular Surgery Why: Post op Staple removal with bilateral lower extremity Arterial duplex ultrasouns with ABI's Contact information: [address] [place] [address] [phone]              Contact information for after-discharge care     Destination     [facility] .   Service: Skilled Nursing Contact information: [address] [place]  [address] [phone]                     Signed: [name], NP  11/23/2023, 12:03 PM

## 2023-11-23 NOTE — TOC Progression Note (Addendum)
 Transition of Care [facility]) - Progression Note    Patient Details  Name: Claudia Mills MRN: [id number] Date of Birth: 1971-02-06  Transition of [facility]) CM/SW Contact  [name], RN Phone Number: 11/23/2023, 10:12 AM  Clinical Narrative:    [facility] is approved for SNF: [id number] TOC made Britney with LC group aware. [facility] in [place] can accept the patient today. TOC sent message to clinical team to make them aware.  TOC received a message from the nurse advising the patient would like a SNF closer to [place]. TOC advised this is the patient's only SNF option. The other option would be home with home health. The patient's [place] was [place] in 3 counties. Likely due to the payor she has only 1 bed offer.   TOC called to the patient's room and spoke with the patient. The patient has concerns about the facility due to distant from her mother. TOC explained that the if she declines the bed. The only other option is home with home health. The patient verbalized understanding and agreed to discharge to [facility] today. TOC made the MD and Nursing aware.    Barriers to Discharge: Insurance Authorization               Expected Discharge Plan and Services                                               Social Drivers of Health (SDOH) Interventions SDOH Screenings   Food Insecurity: No Food Insecurity (10/19/2022)  Housing: Low Risk  (10/19/2022)  Transportation Needs: No Transportation Needs (10/19/2022)  Utilities: Not At Risk (10/19/2022)  Depression (PHQ2-9): Low Risk  (09/12/2023)  Tobacco Use: Low Risk  (11/09/2023)    Readmission Risk Interventions     No data to display

## 2023-11-23 NOTE — Progress Notes (Signed)
 Tried calling [facility] ([phone]) multiple times with no answer.

## 2023-11-23 NOTE — TOC Transition Note (Signed)
 Transition of Care [facility]) - Discharge Note   Patient Details  Name: Claudia Mills MRN: [id number] Date of Birth: January 24, 1971  [facility]) CM/SW Contact:  [name], RN Phone Number: 11/23/2023, 12:27 PM   Clinical Narrative:   Patient is medically clear to discharge to [facility] for STR. [facility] will transport the patient.    Nurse to call report to [phone].  Final next level of care: Skilled Nursing Facility Barriers to Discharge: Barriers Resolved   Patient Goals and CMS Choice     Choice offered to / list presented to : Patient      Discharge Placement              Patient chooses bed at:  [facility]) Patient to be transferred to facility by: [facility]      Discharge Plan and Services Additional resources added to the After Visit Summary for                                       Social Drivers of Health (SDOH) Interventions SDOH Screenings   Food Insecurity: No Food Insecurity (10/19/2022)  Housing: Low Risk  (10/19/2022)  Transportation Needs: No Transportation Needs (10/19/2022)  Utilities: Not At Risk (10/19/2022)  Depression (PHQ2-9): Low Risk  (09/12/2023)  Tobacco Use: Low Risk  (11/09/2023)     Readmission Risk Interventions     No data to display

## 2023-11-23 NOTE — Plan of Care (Signed)
  Problem: Clinical Measurements: Goal: Ability to maintain clinical measurements within normal limits will improve Outcome: Progressing Goal: Cardiovascular complication will be avoided Outcome: Progressing   Problem: Nutrition: Goal: Adequate nutrition will be maintained Outcome: Progressing   Problem: Pain Managment: Goal: General experience of comfort will improve and/or be controlled Outcome: Progressing

## 2023-11-23 NOTE — Progress Notes (Signed)
 Progress Note    11/23/2023 8:35 AM 7 Days Post-Op  Subjective:  Claudia Mills is a 53 yo female now POD #6 from:   PROCEDURE: left profunda femoral artery to above-the-knee popliteal artery bypass with 6 mm ringed PTFE. Left popliteal to anterior tibial artery bypass with reversed great saphenous vein. Left profunda femoris endarterectomy. Left popliteal artery endarterectomy. Exploration of the peroneal and the posterior tibial arteries with endarterectomy of the posterior tibial   POD #12 From:   PROCEDURE: 1.   Left common femoral endarterectomy and  bovine pericardial patch angioplasty 2.   Left SFA and popliteal artery embolectomy with a 3 Fogarty embolectomy balloon   AND   POD #10 FROM:   Procedure(s) Performed:             1.  Introduction catheter into left lower extremity 3rd order catheter placement              2.  Contrast injection left lower extremity for distal runoff             3.  Percutaneous transluminal angioplasty and stent placement left external iliac artery.             4.  Percutaneous mechanical thrombectomy of the left superficial femoral artery with a combination of 10 mg of tPA and the [place] thrombectomy catheter.                5. Percutaneous transluminal angioplasty left superficial femoral artery.               6.  Celt closure right common femoral arteriotomy   Patient is resting comfortably in bed this morning. Endorses pian to her left leg this morning. This is to be expected. She endorses her toes don't hurt as much today. Endorses her toes feel warmer today.    Vitals:   11/22/23 1706 11/22/23 2011  BP: (!) 143/76 (!) 145/69  Pulse: (!) 106 (!) 101  Resp: 16 18  Temp: 99.4 F (37.4 C) 98.3 F (36.8 C)  SpO2: 100% 97%   Physical Exam: Cardiac:  RRR, normal S1 and S2.  No murmurs. Lungs: Clear throughout on auscultation but diminished in the bases.  No rales rhonchi or wheezing noted. Incisions: Left groin incision with  dressing clean dry and intact.  No hematoma seroma no. Left Thigh and left calf incisions with dressings clean dry and intact.  Extremities: Bilateral lower extremities warm to touch with good Doppler pulses.  Patient does not have a Doppler pulse in her left dorsalis pedis. This was known prior to surgery. Left Great toe and second toe are now discolored but warm to touch. Entire left leg is painful to touch Abdomen: Positive bowel sounds throughout, soft, nontender nondistended. Neurologic: Alert and oriented x 3, answers questions and follows commands  CBC    Component Value Date/Time   WBC 12.9 (H) 11/17/2023 0418   RBC 2.69 (L) 11/17/2023 0418   HGB 9.3 (L) 11/17/2023 0418   HCT 27.8 (L) 11/17/2023 0418   PLT 182 11/17/2023 0418   MCV 103.3 (H) 11/17/2023 0418   MCH 34.6 (H) 11/17/2023 0418   MCHC 33.5 11/17/2023 0418   RDW 14.1 11/17/2023 0418   LYMPHSABS 2.2 09/20/2021 1849   MONOABS 0.4 09/20/2021 1849   EOSABS 0.1 09/20/2021 1849   BASOSABS 0.1 09/20/2021 1849    BMET    Component Value Date/Time   NA 135 11/19/2023 0304   K 3.3 (L) 11/19/2023 0304  CL 102 11/19/2023 0304   CO2 26 11/19/2023 0304   GLUCOSE 136 (H) 11/19/2023 0304   BUN <5 (L) 11/19/2023 0304   CREATININE 0.45 11/23/2023 0516   CREATININE 0.58 09/20/2022 0930   CALCIUM  8.0 (L) 11/19/2023 0304   GFRNONAA >60 11/23/2023 0516   GFRAA >60 12/30/2015 2048    INR    Component Value Date/Time   INR 1.0 11/08/2023 1611     Intake/Output Summary (Last 24 hours) at 11/23/2023 0835 Last data filed at 11/22/2023 1900 Gross per 24 hour  Intake 810 ml  Output --  Net 810 ml     Assessment/Plan:  53 y.o. female is s/p SEE ABOVE  7 Days Post-Op   PLAN Strong Doppler PT/DP in left lower extremity this morning.  OOB to the chair today for 4 plus hours a day Continue to work with Physical Therapy and Occupational Therapy All wound care by nursing today.  Nursing to paint all staple line incisions  with betadine daily.   PT/OT recommendations are discharge to Skilled Nursing Facility. Will work on getting this set up.   DVT prophylaxis:  Lovenox 40 mg daily, ASA 81 mg Daily, Plavix 75 MG Daily.    Claudia Mills [facility] 11/23/2023 8:35 AM

## 2023-11-23 NOTE — Discharge Instructions (Signed)
 Vascular Discharge Instructions:  Patient has multiple areas to her left lower extremity with staples.  Staples need to be washed and clean with just soap and warm water  every day and then painted with Betadine daily.  The patient is on a considerable amount of anticoagulation and therefore if she oozes from these areas please cover with guaze/4X4's or ABD Pad.  Keep all staple lines clean and dry.  Patient may shower daily.  Do not scrub the incision lines.  Okay to let warm water  and soap run over the staple lines but immediately after showering pat all areas completely dry and pink with Betadine.  Do not lift anything heavy for the next 4 weeks.  Do not lift anything more than a gallon of milk.  There are no restrictions to ambulation or walking.  Patient will need two-person assist if taking narcotic medications to help with pain.  Patient to follow-up with vein and vascular surgery as scheduled.

## 2023-11-24 DIAGNOSIS — I998 Other disorder of circulatory system: Secondary | ICD-10-CM | POA: Diagnosis not present

## 2023-11-24 DIAGNOSIS — E119 Type 2 diabetes mellitus without complications: Secondary | ICD-10-CM | POA: Diagnosis not present

## 2023-11-24 DIAGNOSIS — I739 Peripheral vascular disease, unspecified: Secondary | ICD-10-CM | POA: Diagnosis not present

## 2023-11-24 DIAGNOSIS — R5381 Other malaise: Secondary | ICD-10-CM | POA: Diagnosis not present

## 2023-11-24 DIAGNOSIS — E785 Hyperlipidemia, unspecified: Secondary | ICD-10-CM | POA: Diagnosis not present

## 2023-11-24 DIAGNOSIS — G894 Chronic pain syndrome: Secondary | ICD-10-CM | POA: Diagnosis not present

## 2023-11-24 DIAGNOSIS — I2510 Atherosclerotic heart disease of native coronary artery without angina pectoris: Secondary | ICD-10-CM | POA: Diagnosis not present

## 2023-11-24 DIAGNOSIS — I10 Essential (primary) hypertension: Secondary | ICD-10-CM | POA: Diagnosis not present

## 2023-11-27 DIAGNOSIS — I10 Essential (primary) hypertension: Secondary | ICD-10-CM | POA: Diagnosis not present

## 2023-11-27 DIAGNOSIS — E785 Hyperlipidemia, unspecified: Secondary | ICD-10-CM | POA: Diagnosis not present

## 2023-11-27 DIAGNOSIS — I739 Peripheral vascular disease, unspecified: Secondary | ICD-10-CM | POA: Diagnosis not present

## 2023-11-27 DIAGNOSIS — E119 Type 2 diabetes mellitus without complications: Secondary | ICD-10-CM | POA: Diagnosis not present

## 2023-11-27 DIAGNOSIS — I2510 Atherosclerotic heart disease of native coronary artery without angina pectoris: Secondary | ICD-10-CM | POA: Diagnosis not present

## 2023-11-27 DIAGNOSIS — G894 Chronic pain syndrome: Secondary | ICD-10-CM | POA: Diagnosis not present

## 2023-11-27 DIAGNOSIS — I998 Other disorder of circulatory system: Secondary | ICD-10-CM | POA: Diagnosis not present

## 2023-11-27 DIAGNOSIS — R5381 Other malaise: Secondary | ICD-10-CM | POA: Diagnosis not present

## 2023-11-28 ENCOUNTER — Ambulatory Visit: Admission: RE | Admit: 2023-11-28 | Source: Home / Self Care | Admitting: Gastroenterology

## 2023-11-28 ENCOUNTER — Encounter: Admission: RE | Payer: Self-pay | Source: Home / Self Care

## 2023-11-28 SURGERY — COLONOSCOPY
Anesthesia: General

## 2023-11-29 DIAGNOSIS — E1140 Type 2 diabetes mellitus with diabetic neuropathy, unspecified: Secondary | ICD-10-CM | POA: Diagnosis not present

## 2023-11-29 DIAGNOSIS — Z48812 Encounter for surgical aftercare following surgery on the circulatory system: Secondary | ICD-10-CM | POA: Diagnosis not present

## 2023-11-29 DIAGNOSIS — I10 Essential (primary) hypertension: Secondary | ICD-10-CM | POA: Diagnosis not present

## 2023-11-30 ENCOUNTER — Telehealth (INDEPENDENT_AMBULATORY_CARE_PROVIDER_SITE_OTHER): Payer: Self-pay

## 2023-11-30 ENCOUNTER — Emergency Department (HOSPITAL_COMMUNITY)
Admission: EM | Admit: 2023-11-30 | Discharge: 2023-12-01 | Disposition: A | Discharge status: Home / Self Care | Attending: Emergency Medicine | Admitting: Emergency Medicine

## 2023-11-30 ENCOUNTER — Encounter (HOSPITAL_COMMUNITY): Payer: Self-pay

## 2023-11-30 ENCOUNTER — Emergency Department (HOSPITAL_COMMUNITY)

## 2023-11-30 ENCOUNTER — Other Ambulatory Visit: Payer: Self-pay

## 2023-11-30 DIAGNOSIS — Z7982 Long term (current) use of aspirin: Secondary | ICD-10-CM | POA: Diagnosis not present

## 2023-11-30 DIAGNOSIS — I739 Peripheral vascular disease, unspecified: Secondary | ICD-10-CM

## 2023-11-30 DIAGNOSIS — E109 Type 1 diabetes mellitus without complications: Secondary | ICD-10-CM | POA: Diagnosis not present

## 2023-11-30 DIAGNOSIS — I872 Venous insufficiency (chronic) (peripheral): Secondary | ICD-10-CM | POA: Diagnosis not present

## 2023-11-30 DIAGNOSIS — I10 Essential (primary) hypertension: Secondary | ICD-10-CM | POA: Diagnosis not present

## 2023-11-30 DIAGNOSIS — Z79899 Other long term (current) drug therapy: Secondary | ICD-10-CM | POA: Insufficient documentation

## 2023-11-30 DIAGNOSIS — I700 Atherosclerosis of aorta: Secondary | ICD-10-CM | POA: Diagnosis not present

## 2023-11-30 DIAGNOSIS — I771 Stricture of artery: Secondary | ICD-10-CM | POA: Diagnosis present

## 2023-11-30 DIAGNOSIS — I2510 Atherosclerotic heart disease of native coronary artery without angina pectoris: Secondary | ICD-10-CM | POA: Diagnosis not present

## 2023-11-30 DIAGNOSIS — D72829 Elevated white blood cell count, unspecified: Secondary | ICD-10-CM | POA: Insufficient documentation

## 2023-11-30 DIAGNOSIS — I70201 Unspecified atherosclerosis of native arteries of extremities, right leg: Secondary | ICD-10-CM | POA: Diagnosis not present

## 2023-11-30 DIAGNOSIS — Z7902 Long term (current) use of antithrombotics/antiplatelets: Secondary | ICD-10-CM | POA: Insufficient documentation

## 2023-11-30 LAB — CBC
HCT: 30.7 % — ABNORMAL LOW (ref 36.0–46.0)
Hemoglobin: 9.5 g/dL — ABNORMAL LOW (ref 12.0–15.0)
MCH: 32.3 pg (ref 26.0–34.0)
MCHC: 30.9 g/dL (ref 30.0–36.0)
MCV: 104.4 fL — ABNORMAL HIGH (ref 80.0–100.0)
Platelets: 545 K/uL — ABNORMAL HIGH (ref 150–400)
RBC: 2.94 MIL/uL — ABNORMAL LOW (ref 3.87–5.11)
RDW: 13.5 % (ref 11.5–15.5)
WBC: 11.2 K/uL — ABNORMAL HIGH (ref 4.0–10.5)
nRBC: 0 % (ref 0.0–0.2)

## 2023-11-30 LAB — BASIC METABOLIC PANEL WITH GFR
Anion gap: 14 (ref 5–15)
BUN: 6 mg/dL (ref 6–20)
CO2: 21 mmol/L — ABNORMAL LOW (ref 22–32)
Calcium: 8.8 mg/dL — ABNORMAL LOW (ref 8.9–10.3)
Chloride: 103 mmol/L (ref 98–111)
Creatinine, Ser: 0.72 mg/dL (ref 0.44–1.00)
GFR, Estimated: >60 mL/min (ref >60)
Glucose, Bld: 153 mg/dL — ABNORMAL HIGH (ref 70–99)
Potassium: 3.7 mmol/L (ref 3.5–5.1)
Sodium: 138 mmol/L (ref 135–145)

## 2023-11-30 LAB — I-STAT CG4 LACTIC ACID, ED: Lactic Acid, Venous: 1.2 mmol/L (ref 0.5–1.9)

## 2023-11-30 LAB — APTT: aPTT: 35 s (ref 24–36)

## 2023-11-30 LAB — PROTIME-INR
INR: 0.9 (ref 0.8–1.2)
Prothrombin Time: 13.1 s (ref 11.4–15.2)

## 2023-11-30 MED ORDER — IOHEXOL 350 MG/ML SOLN
100.0000 mL | Freq: Once | INTRAVENOUS | Status: AC | PRN
  Administered 2023-11-30: 100 mL via INTRAVENOUS

## 2023-11-30 MED ORDER — IOHEXOL 350 MG/ML SOLN
75.0000 mL | Freq: Once | INTRAVENOUS | Status: AC | PRN
  Administered 2023-11-30: 75 mL via INTRAVENOUS

## 2023-11-30 NOTE — ED Triage Notes (Addendum)
 BIB [facility] coming from [place] re[facility] facility, procedure done 2025 L leg, onset of toe turning black started Sept 2025, L foot big toe and second toe.   Vitals: Bp 136/palp HR 142  O2: 93% RA GCS 15 A/O x 4

## 2023-11-30 NOTE — Telephone Encounter (Signed)
 Spoke to Dr. Dreama in reference to nurse concern about patient, he stated if first and second toe are dark that is to be expected, but if the discoloration to the top of the foot is a new onset he advises patient to be seen in the ED. Notifed wound nurse at this time and made her aware of provider advice. Nurse stated she would discuss with patient at this time.

## 2023-11-30 NOTE — ED Provider Triage Note (Signed)
 Emergency Medicine Provider Triage Evaluation Note  Claudia Mills , a 53 y.o. female  was evaluated in triage.  Pt complains of toes turning black on left foot big toe, second toe.  She is coming from [facility].  She had an endarterectomy, bypass graft done in September of this year.  She reports that since then the pain has overall improved in the affected leg, but toes are now returning to black color.  Review of Systems  Positive: blackened toes Negative:   Physical Exam  BP 121/71   Pulse (!) 111   Temp 98.3 F (36.8 C)   Resp 16   Ht 5' 3 (1.6 m)   Wt 55.8 kg   LMP 09/29/2015 (Approximate) Comment: neg. preg test  SpO2 100%   BMI 21.79 kg/m  Gen:   Awake, no distress   Resp:  Normal effort  MSK:   Moves extremities without difficulty  Other:  Weakly palpable DP pulse, 1+ in the affected left lower extremity, difficult to discern pulse in PT on the affected side.  Medical Decision Making  Medically screening exam initiated at 4:32 PM.  Appropriate orders placed.  Claudia Mills was informed that the remainder of the evaluation will be completed by another provider, this initial triage assessment does not replace that evaluation, and the importance of remaining in the ED until their evaluation is complete.  Workup initiated in triage    Claudia Mills, [place] 11/30/23 [id number]

## 2023-11-30 NOTE — ED Provider Notes (Signed)
 [place] EMERGENCY DEPARTMENT AT [facility] Provider Note   CSN: [id number] Arrival date & time: 11/30/23  1616     Patient presents with: Toe Injury   Claudia Mills is a 53 y.o. female.  Patient is a 53 year old female with PMH of HTN, CAD, insulin -dependent diabetes, PVD c/b critical limb ischemia bilateral lower extremities with associated hospitalization 09/24- 11/23/2023 s/p left common femoral endarterectomy and left SFA and popliteal artery embolectomy presenting to the ED with chief complaint of LLE 1st and 2nd toe turning black.  Patient denies any recent fevers, chills, nausea, vomiting, worsening pain, redness/swelling, abdominal pain, chest pain, shortness of breath.    Prior to Admission medications   Medication Sig Start Date End Date Taking? Authorizing Provider  aspirin  EC 81 MG tablet Take 1 tablet (81 mg total) by mouth daily. Swallow whole. 11/24/23   Pace, Gwendlyn SAUNDERS, NP  atorvastatin  (LIPITOR) 80 MG tablet Take 80 mg by mouth daily. 11/07/23   [provider]  atorvastatin  (LIPITOR) 80 MG tablet Take 1 tablet (80 mg total) by mouth daily. 11/23/23   Pace, Gwendlyn SAUNDERS, NP  BRILINTA  90 MG TABS tablet Take 90 mg by mouth 2 (two) times daily. 07/07/22   [provider]  clopidogrel (PLAVIX) 75 MG tablet Take 1 tablet (75 mg total) by mouth daily at 6 (six) AM. 11/24/23   Pace, Gwendlyn SAUNDERS, NP  cyclobenzaprine  (FLEXERIL ) 5 MG tablet Take 1 tablet (5 mg total) by mouth 3 (three) times daily as needed for muscle spasms. 08/17/23   Hilma Hastings, PA-C  DULoxetine  (CYMBALTA ) 30 MG capsule Take 30 mg by mouth daily.    [provider]  empagliflozin (JARDIANCE) 25 MG TABS tablet Take 25 mg by mouth daily.    [provider]  gabapentin  (NEURONTIN ) 300 MG capsule Take 300 mg by mouth 3 (three) times daily.    [provider]  glipiZIDE  (GLUCOTROL ) 5 MG tablet Take 1 tablet (5 mg total) by mouth daily before breakfast. 10/19/22 11/08/23   Caleen Qualia, MD  lisinopril  (ZESTRIL ) 20 MG tablet Take 1 tablet (20 mg total) by mouth daily. 10/20/22   Amin, Sumayya, MD  nitroGLYCERIN  (NITROSTAT ) 0.3 MG SL tablet Place 0.3 mg under the tongue every 5 (five) minutes as needed for chest pain.    [provider]  oxyCODONE  (OXY IR/ROXICODONE ) 5 MG immediate release tablet Take 1-2 tablets (5-10 mg total) by mouth every 4 (four) hours as needed for moderate pain (pain score 4-6). 11/23/23   Pace, Gwendlyn SAUNDERS, NP  pantoprazole  (PROTONIX ) 40 MG tablet Take 1 tablet (40 mg total) by mouth daily. 10/19/22   Caleen Qualia, MD    Allergies: Bee venom    Review of Systems  Updated Vital Signs BP 121/71   Pulse (!) 111   Temp 98.3 F (36.8 C)   Resp 16   Ht 5' 3 (1.6 m)   Wt 55.8 kg   LMP 09/29/2015 (Approximate) Comment: neg. preg test  SpO2 100%   BMI 21.79 kg/m   Physical Exam Constitutional:      Appearance: Normal appearance.  HENT:     Head: Normocephalic and atraumatic.     Nose: Nose normal.     Mouth/Throat:     Mouth: Mucous membranes are moist.     Pharynx: Oropharynx is clear.  Eyes:     Extraocular Movements: Extraocular movements intact.     Pupils: Pupils are equal, round, and reactive to light.  Cardiovascular:  Rate and Rhythm: Normal rate and regular rhythm.     Pulses: Normal pulses.     Heart sounds: Normal heart sounds.  Pulmonary:     Effort: Pulmonary effort is normal.     Breath sounds: Normal breath sounds.  Abdominal:     General: Abdomen is flat.     Palpations: Abdomen is soft.  Musculoskeletal:     Cervical back: Normal range of motion.     Comments: Necrotic/black ulcerations overlying 1st, 2nd, and 5th digits of LLE distally -no sensation or capillary refill  Palpable and intact dopplerable pulse PT pulse without appreciable DP  Skin:    General: Skin is warm and dry.     Capillary Refill: Capillary refill takes less than 2 seconds.  Neurological:     General: No focal deficit  present.     Mental Status: She is alert and oriented to person, place, and time.     (all labs ordered are listed, but only abnormal results are displayed) Labs Reviewed  CBC - Abnormal; Notable for the following components:      Result Value   WBC 11.2 (*)    RBC 2.94 (*)    Hemoglobin 9.5 (*)    HCT 30.7 (*)    MCV 104.4 (*)    Platelets 545 (*)    All other components within normal limits  BASIC METABOLIC PANEL WITH GFR - Abnormal; Notable for the following components:   CO2 21 (*)    Glucose, Bld 153 (*)    Calcium  8.8 (*)    All other components within normal limits  PROTIME-INR  APTT  I-STAT CG4 LACTIC ACID, ED    EKG: None  Radiology: CT Angio Aortobifemoral W and/or Wo Contrast Result Date: 11/30/2023 EXAM: CTA ABDOMEN AND PELVIS WITH CONTRAST AND RUNOFF CTA OF THE LOWER EXTREMITIES WITH CONTRAST 11/30/2023 07:01:33 PM TECHNIQUE: CTA images of the abdomen, pelvis and lower extremities with intravenous contrast. Three-dimensional MIP/volume rendered formations were performed. Automated exposure control, iterative reconstruction, and/or weight based adjustment of the mA/kV was utilized to reduce the radiation dose to as low as reasonably achievable. COMPARISON: None available. CLINICAL HISTORY: Claudication or leg ischemia. Repeated scan due to poor enhancement. FINDINGS: VASCULATURE: AORTA: Atherosclerotic calcifications of the abdominal aorta, although patent. No acute finding. No abdominal aortic aneurysm. No dissection. CELIAC TRUNK: Atherosclerotic calcifications, although patent. No acute finding. No occlusion or significant stenosis. SUPERIOR MESENTERIC ARTERY: Atherosclerotic calcifications, although patent. No acute finding. No occlusion or significant stenosis. INFERIOR MESENTERIC ARTERY: Atherosclerotic calcifications, although patent. No acute finding. No occlusion or significant stenosis. RENAL ARTERIES: Atherosclerotic calcifications, although patent. No acute  finding. No occlusion or significant stenosis. RIGHT ILIAC ARTERIES: Atherosclerotic calcifications, although patent. No acute finding. No occlusion or significant stenosis. RIGHT FEMORAL ARTERIES: Superior femoral artery is occluded, reconstituting distally at the level of the popliteal artery. RIGHT POPLITEAL ARTERY: Patent with mild narrowing. RIGHT CALF ARTERIES: Patent but diminutive 3 vessel runoff to the level of the lower calf/upper calf, with dominant peroneal to the foot. LEFT ILIAC ARTERIES: Patent external iliac stent. LEFT FEMORAL ARTERIES: Occluded SFA stent. Patent left femoropopliteal bypass. LEFT POPLITEAL ARTERY: Patent, but with 50% stenosis/narrowing at the mid popliteal artery. LEFT CALF ARTERIES: Occluded anterior and posterior tibial arteries in the proximal calf, with dominant single vessel peroneal runoff to the foot. ABDOMEN AND PELVIS: LOWER CHEST: Visualized portion of the lower chest demonstrates no acute abnormality. LIVER: The liver is unremarkable. GALLBLADDER AND BILE DUCTS: Gallbladder is unremarkable.  No biliary ductal dilatation. SPLEEN: The spleen is unremarkable. PANCREAS: The pancreas is unremarkable. ADRENAL GLANDS: Bilateral adrenal glands demonstrate no acute abnormality. KIDNEYS, URETERS AND BLADDER: Subcentimeter bilateral renal cysts, benign. No follow-up is recommended. Mildly thick-walled bladder, although underdistended. No stones in the kidneys or ureters. No hydronephrosis. No evidence of perinephric or periureteral stranding. GI AND Bowel: Normal appendix (image 142). Stomach and duodenal sweep demonstrate no acute abnormality. There is no bowel obstruction. No abnormal bowel wall thickening or distension. REPRODUCTIVE: The uterus is within normal limits. PERITONEUM AND RETROPERITONEUM: No ascites or free air. LYMPH NODES: No evidence of lymphadenopathy. BONES AND SOFT TISSUES: Skin staples overlying the left lower extremity from the groin to the mid calf,  postprocedural. 2.3 x 3.3 cm intramuscular hematoma along the left lateral calf (image 448), postprocedural. No acute abnormality of the bones. IMPRESSION: 1. Postprocedural changes related to left fem-pop bypass, patent. Dominant single vessel (peroneal) runoff to the foot. Patent external iliac and occluded SFA stents. 2. Right SFA occlusion with distal reconstitution. Diminutive three-vessel runoff to the lower calf/upper calf with dominant peroneal to the foot. 3. Additional ancillary findings as above. Electronically signed by: Pinkie Pebbles MD 11/30/2023 07:50 PM EDT RP Workstation: [id number]     Procedures   Medications Ordered in the ED  iohexol  (OMNIPAQUE ) 350 MG/ML injection 75 mL (75 mLs Intravenous Contrast Given 11/30/23 1854)  iohexol  (OMNIPAQUE ) 350 MG/ML injection 100 mL (100 mLs Intravenous Contrast Given 11/30/23 1902)    Clinical Course as of 11/30/23 2309  Thu Nov 30, 2023  2057 Discussed with vascular surgery.  [WB]  2108 Care at [facility]. - Recommend allamance. 1x bypass down, flow to level of ankle.  [WB]  2[facility] consulted.  [WB]  2255 Per [facility] - Non-salvageable from angiography/endovascular intervention. Plan for follow-up in 7-10 days outpatient.  [WB]    Clinical Course User Index [WB] Catrina Fellenz, Elsie, MD                                 Medical Decision Making Risk Prescription drug management.   53 year old female with PMH as above presenting to the ED due to being evaluated by wound care earlier today and urged to present in the setting of worsening necrosis of left first, second, and right distal digits of LLE.  No systemic symptoms.  No worsening pain.  On arrival, patient hemodynamically stable in no acute distress.  Normotensive.  Afebrile.  Initially sinus tachycardic which resolved upon assessment.  No tachypnea.  Saturating 100% RA.  GCS 15.  Sickle examination consistent with worsening necrosis of  distal LLE with absence of sensation and capillary refill and distal digits without evidence of superimposed infection.  No overlying erythema, fluctuance, or active drainage.  Not endorsing any acute pain.  Palpable PT pulse without dopplerable DP in left foot.  CBC with minimal leukocytosis, WBC 11.20.  BMP without evidence of acute electrolytic derangement or AKI.  Lactate WNL, 1.2.  CTA angio aortobifemoral with and without contrast ordered with evidence of 1 bypass down and arterial flow to the level of the ankle.  Vascular surgery consulted and case discussed, recommending consult to vascular surgery at [facility] as this is where patient had her initial procedure.  Vascular [place] consulted.  Discussed with vascular surgery of [place], recommend that patient follow-up in 7 to 10 days.  Per vascular surgery, no further acute surgical interventions are indicated  at this time.  At this time, patient hemodynamically stable and appropriate for discharge.  Plan of care discussed with patient at bedside.  All questions answered at bedside.  Strict return precautions discussed.  Final diagnoses:  Arterial insufficiency of lower extremity    ED Discharge Orders     None          Anoop Hemmer, Elsie, MD 11/30/23 7690    Randol Simmonds, MD 12/01/23 2342

## 2023-11-30 NOTE — Telephone Encounter (Signed)
 Wound care nurse Stephens called from [facility] in reference to patient's L foot. She stated patients first and second toe are completely black, the top of her foot has fluid filled blisters at this time and is maroon in color. She first noticed the onset of discoloration in the foot on Monday 11/27/23.

## 2023-11-30 NOTE — Discharge Instructions (Signed)
 Your evaluated in the emergency department for worsening skin necrosis/breakdown of the left foot in the setting of recent vascular interventions.  Your workup with without evidence of acute infection.  Your case was discussed with vascular surgery of [place] who recommended that you follow-up outpatient in 7 to 10 days.

## 2023-12-01 DIAGNOSIS — G894 Chronic pain syndrome: Secondary | ICD-10-CM | POA: Diagnosis not present

## 2023-12-01 DIAGNOSIS — I739 Peripheral vascular disease, unspecified: Secondary | ICD-10-CM | POA: Diagnosis not present

## 2023-12-01 DIAGNOSIS — I2510 Atherosclerotic heart disease of native coronary artery without angina pectoris: Secondary | ICD-10-CM | POA: Diagnosis not present

## 2023-12-01 DIAGNOSIS — I10 Essential (primary) hypertension: Secondary | ICD-10-CM | POA: Diagnosis not present

## 2023-12-01 DIAGNOSIS — E119 Type 2 diabetes mellitus without complications: Secondary | ICD-10-CM | POA: Diagnosis not present

## 2023-12-01 DIAGNOSIS — I998 Other disorder of circulatory system: Secondary | ICD-10-CM | POA: Diagnosis not present

## 2023-12-01 DIAGNOSIS — E785 Hyperlipidemia, unspecified: Secondary | ICD-10-CM | POA: Diagnosis not present

## 2023-12-01 DIAGNOSIS — R5381 Other malaise: Secondary | ICD-10-CM | POA: Diagnosis not present

## 2023-12-04 DIAGNOSIS — I10 Essential (primary) hypertension: Secondary | ICD-10-CM | POA: Diagnosis not present

## 2023-12-04 DIAGNOSIS — I739 Peripheral vascular disease, unspecified: Secondary | ICD-10-CM | POA: Diagnosis not present

## 2023-12-04 DIAGNOSIS — E119 Type 2 diabetes mellitus without complications: Secondary | ICD-10-CM | POA: Diagnosis not present

## 2023-12-04 DIAGNOSIS — G894 Chronic pain syndrome: Secondary | ICD-10-CM | POA: Diagnosis not present

## 2023-12-04 DIAGNOSIS — I2510 Atherosclerotic heart disease of native coronary artery without angina pectoris: Secondary | ICD-10-CM | POA: Diagnosis not present

## 2023-12-04 DIAGNOSIS — R5381 Other malaise: Secondary | ICD-10-CM | POA: Diagnosis not present

## 2023-12-04 DIAGNOSIS — E785 Hyperlipidemia, unspecified: Secondary | ICD-10-CM | POA: Diagnosis not present

## 2023-12-04 DIAGNOSIS — I998 Other disorder of circulatory system: Secondary | ICD-10-CM | POA: Diagnosis not present

## 2023-12-08 DIAGNOSIS — I998 Other disorder of circulatory system: Secondary | ICD-10-CM | POA: Diagnosis not present

## 2023-12-08 DIAGNOSIS — G894 Chronic pain syndrome: Secondary | ICD-10-CM | POA: Diagnosis not present

## 2023-12-08 DIAGNOSIS — E785 Hyperlipidemia, unspecified: Secondary | ICD-10-CM | POA: Diagnosis not present

## 2023-12-08 DIAGNOSIS — I739 Peripheral vascular disease, unspecified: Secondary | ICD-10-CM | POA: Diagnosis not present

## 2023-12-08 DIAGNOSIS — R5381 Other malaise: Secondary | ICD-10-CM | POA: Diagnosis not present

## 2023-12-08 DIAGNOSIS — I10 Essential (primary) hypertension: Secondary | ICD-10-CM | POA: Diagnosis not present

## 2023-12-08 DIAGNOSIS — E119 Type 2 diabetes mellitus without complications: Secondary | ICD-10-CM | POA: Diagnosis not present

## 2023-12-08 DIAGNOSIS — I2510 Atherosclerotic heart disease of native coronary artery without angina pectoris: Secondary | ICD-10-CM | POA: Diagnosis not present

## 2023-12-11 DIAGNOSIS — I998 Other disorder of circulatory system: Secondary | ICD-10-CM | POA: Diagnosis not present

## 2023-12-11 DIAGNOSIS — I10 Essential (primary) hypertension: Secondary | ICD-10-CM | POA: Diagnosis not present

## 2023-12-11 DIAGNOSIS — G894 Chronic pain syndrome: Secondary | ICD-10-CM | POA: Diagnosis not present

## 2023-12-11 DIAGNOSIS — R5381 Other malaise: Secondary | ICD-10-CM | POA: Diagnosis not present

## 2023-12-11 DIAGNOSIS — E119 Type 2 diabetes mellitus without complications: Secondary | ICD-10-CM | POA: Diagnosis not present

## 2023-12-11 DIAGNOSIS — I2510 Atherosclerotic heart disease of native coronary artery without angina pectoris: Secondary | ICD-10-CM | POA: Diagnosis not present

## 2023-12-11 DIAGNOSIS — I739 Peripheral vascular disease, unspecified: Secondary | ICD-10-CM | POA: Diagnosis not present

## 2023-12-11 DIAGNOSIS — E785 Hyperlipidemia, unspecified: Secondary | ICD-10-CM | POA: Diagnosis not present

## 2023-12-13 ENCOUNTER — Other Ambulatory Visit (INDEPENDENT_AMBULATORY_CARE_PROVIDER_SITE_OTHER): Payer: Self-pay | Admitting: Vascular Surgery

## 2023-12-13 DIAGNOSIS — I739 Peripheral vascular disease, unspecified: Secondary | ICD-10-CM | POA: Diagnosis not present

## 2023-12-13 DIAGNOSIS — Z9889 Other specified postprocedural states: Secondary | ICD-10-CM

## 2023-12-13 DIAGNOSIS — E785 Hyperlipidemia, unspecified: Secondary | ICD-10-CM | POA: Diagnosis not present

## 2023-12-13 DIAGNOSIS — T8130XA Disruption of wound, unspecified, initial encounter: Secondary | ICD-10-CM | POA: Diagnosis not present

## 2023-12-14 ENCOUNTER — Other Ambulatory Visit (INDEPENDENT_AMBULATORY_CARE_PROVIDER_SITE_OTHER): Payer: Self-pay | Admitting: Nurse Practitioner

## 2023-12-14 ENCOUNTER — Other Ambulatory Visit (INDEPENDENT_AMBULATORY_CARE_PROVIDER_SITE_OTHER)

## 2023-12-14 ENCOUNTER — Encounter (INDEPENDENT_AMBULATORY_CARE_PROVIDER_SITE_OTHER): Payer: Self-pay | Admitting: Nurse Practitioner

## 2023-12-14 ENCOUNTER — Ambulatory Visit (INDEPENDENT_AMBULATORY_CARE_PROVIDER_SITE_OTHER): Admitting: Nurse Practitioner

## 2023-12-14 VITALS — BP 147/74 | HR 111

## 2023-12-14 DIAGNOSIS — E785 Hyperlipidemia, unspecified: Secondary | ICD-10-CM | POA: Diagnosis not present

## 2023-12-14 DIAGNOSIS — I998 Other disorder of circulatory system: Secondary | ICD-10-CM | POA: Diagnosis not present

## 2023-12-14 DIAGNOSIS — T8130XA Disruption of wound, unspecified, initial encounter: Secondary | ICD-10-CM | POA: Diagnosis not present

## 2023-12-14 DIAGNOSIS — I739 Peripheral vascular disease, unspecified: Secondary | ICD-10-CM

## 2023-12-14 DIAGNOSIS — T8149XA Infection following a procedure, other surgical site, initial encounter: Secondary | ICD-10-CM | POA: Diagnosis not present

## 2023-12-14 DIAGNOSIS — Z9889 Other specified postprocedural states: Secondary | ICD-10-CM

## 2023-12-14 DIAGNOSIS — I10 Essential (primary) hypertension: Secondary | ICD-10-CM | POA: Diagnosis not present

## 2023-12-14 DIAGNOSIS — Z48812 Encounter for surgical aftercare following surgery on the circulatory system: Secondary | ICD-10-CM | POA: Diagnosis not present

## 2023-12-14 DIAGNOSIS — G894 Chronic pain syndrome: Secondary | ICD-10-CM | POA: Diagnosis not present

## 2023-12-14 DIAGNOSIS — R5381 Other malaise: Secondary | ICD-10-CM | POA: Diagnosis not present

## 2023-12-14 DIAGNOSIS — I2510 Atherosclerotic heart disease of native coronary artery without angina pectoris: Secondary | ICD-10-CM | POA: Diagnosis not present

## 2023-12-14 DIAGNOSIS — E1140 Type 2 diabetes mellitus with diabetic neuropathy, unspecified: Secondary | ICD-10-CM | POA: Diagnosis not present

## 2023-12-14 NOTE — Progress Notes (Signed)
 Per Orvin Daring NP, wound cultured to be sent out today to [facility],wet to dry dressing applied, steri strips placed between remaining staples and covered with gauze and paper tape. Pt to follow up at facility for wound dressings.    Dois Seip CMA

## 2023-12-18 LAB — VAS US ABI WITH/WO TBI
Left ABI: 0.78
Right ABI: 0.45

## 2023-12-20 ENCOUNTER — Encounter (INDEPENDENT_AMBULATORY_CARE_PROVIDER_SITE_OTHER): Payer: Self-pay | Admitting: Nurse Practitioner

## 2023-12-20 ENCOUNTER — Ambulatory Visit (INDEPENDENT_AMBULATORY_CARE_PROVIDER_SITE_OTHER): Admitting: Nurse Practitioner

## 2023-12-20 VITALS — BP 104/67 | HR 82 | Resp 16 | Wt 130.2 lb

## 2023-12-20 DIAGNOSIS — Z9889 Other specified postprocedural states: Secondary | ICD-10-CM

## 2023-12-20 DIAGNOSIS — I739 Peripheral vascular disease, unspecified: Secondary | ICD-10-CM

## 2023-12-21 LAB — SPECIMEN STATUS REPORT

## 2023-12-21 LAB — AEROBIC CULTURE

## 2023-12-24 ENCOUNTER — Encounter (INDEPENDENT_AMBULATORY_CARE_PROVIDER_SITE_OTHER): Payer: Self-pay | Admitting: Nurse Practitioner

## 2023-12-24 NOTE — Progress Notes (Signed)
 Subjective:    Patient ID: Claudia Mills, female    DOB: Feb 25, 1970, 53 y.o.   MRN: [id number] Chief Complaint  Patient presents with   Follow-up    Post op Staple removal with bilateral lower extremity Arterial duplex ultrasouns with ABI's Pt concern of infection at upper incision and toes of left foot    The patient returns to the office for followup and review status post angiogram with intervention on 11/16/2023.  He is.   Procedure: PROCEDURE: 1. left profunda femoral artery to above-the-knee popliteal artery bypass with 6 mm ringed PTFE. 2. Left popliteal to anterior tibial artery bypass with reversed great saphenous vein. 3. Left profunda femoris endarterectomy. 4. Left popliteal artery endarterectomy. 5. Exploration of the peroneal and the posterior tibial arteries with endarterectomy of the posterior tibial   The patient notes improvement in the lower extremity symptoms.  She still continues to have gangrenous changes on the left foot and toes.  There have been no significant changes to the patient's overall health care.  No documented history of amaurosis fugax or recent TIA symptoms. There are no recent neurological changes noted. No documented history of DVT, PE or superficial thrombophlebitis. The patient denies recent episodes of angina or shortness of breath.  Today the patient has ABI 0.45 on the right and 0.78 on the left.  She has a TBI 0 on the right.  Her previous ABI was 0.54 on the right and 0.44 on the left.  She has monophasic tibial open bilaterally.    Review of Systems     Objective:   Physical Exam  BP (!) 147/74   Pulse (!) 111   LMP 09/29/2015 (Approximate) Comment: neg. preg test  Past Medical History:  Diagnosis Date   Coronary artery disease    Diabetes mellitus without complication (HCC)    Hypertension    MI (myocardial infarction) (HCC)    PVD (peripheral vascular disease)    Tick bite    Pt states she does not remember daye of  tic bite, but that is was since last visit with pain clinic and she was hospitalized for 1 day.    Social History   Socioeconomic History   Marital status: Widowed    Spouse name: Not on file   Number of children: Not on file   Years of education: Not on file   Highest education level: Not on file  Occupational History   Not on file  Tobacco Use   Smoking status: Never   Smokeless tobacco: Never  Vaping Use   Vaping status: Never Used  Substance and Sexual Activity   Alcohol use: Yes    Alcohol/week: 12.0 standard drinks of alcohol    Types: 12 Cans of beer per week   Drug use: Yes    Types: Marijuana    Comment: Patient states, I had some this morning.   Sexual activity: Not on file  Other Topics Concern   Not on file  Social History Narrative   Not on file   Social Drivers of Health   Financial Resource Strain: Not on file  Food Insecurity: No Food Insecurity (10/19/2022)   Hunger Vital Sign    Worried About Running Out of Food in the Last Year: Never true    Ran Out of Food in the Last Year: Never true  Transportation Needs: No Transportation Needs (10/19/2022)   PRAPARE - Administrator, Civil Service (Medical): No    Lack of Transportation (Non-Medical):  No  Physical Activity: Not on file  Stress: Not on file  Social Connections: Not on file  Intimate Partner Violence: Not At Risk (10/19/2022)   Humiliation, Afraid, Rape, and Kick questionnaire    Fear of Current or Ex-Partner: No    Emotionally Abused: No    Physically Abused: No    Sexually Abused: No    Past Surgical History:  Procedure Laterality Date   APPLICATION OF CELL SAVER Left 11/09/2023   Procedure: APPLICATION OF CELL SAVER;  Surgeon: Jama Cordella MATSU, MD;  Location: [facility];  Service: Vascular;  Laterality: Left;   APPLICATION OF CELL SAVER N/A 11/16/2023   Procedure: APPLICATION OF CELL SAVER;  Surgeon: Jama Cordella MATSU, MD;  Location: [facility];  Service: Vascular;  Laterality:  N/A;   BYPASS GRAFT POPLITEAL TO POPLITEAL  11/16/2023   Procedure: CREATION, BYPASS, ARTERIAL, POPLITEAL;  Surgeon: Jama Cordella MATSU, MD;  Location: [facility];  Service: Vascular;;   BYPASS GRAFT POPLITEAL TO TIBIAL  11/16/2023   Procedure: BYPASS GRAFT POPLITEAL TO TIBIAL (CATHLAB);  Surgeon: Jama Cordella MATSU, MD;  Location: [facility];  Service: Vascular;;   CORONARY ANGIOPLASTY WITH STENT PLACEMENT     ENDARTERECTOMY FEMORAL Left 11/09/2023   Procedure: ENDARTERECTOMY, FEMORAL;  Surgeon: Jama Cordella MATSU, MD;  Location: [facility];  Service: Vascular;  Laterality: Left;   ENDARTERECTOMY FEMORAL  11/16/2023   Procedure: ENDARTERECTOMY, FEMORAL;  Surgeon: Jama Cordella MATSU, MD;  Location: [facility];  Service: Vascular;;   ENDARTERECTOMY POPLITEAL  11/16/2023   Procedure: ENDARTERECTOMY, POPLITEAL;  Surgeon: Jama Cordella MATSU, MD;  Location: [facility];  Service: Vascular;;   ENDARTERECTOMY TIBIOPERONEAL  11/16/2023   Procedure: ENDARTERECTOMY TIBIOPERONEAL (CATHLAB);  Surgeon: Jama Cordella MATSU, MD;  Location: [facility];  Service: Vascular;;   IR KYPHO LUMBAR INC FX REDUCE BONE BX UNI/BIL CANNULATION INC/IMAGING  10/06/2022   IR RADIOLOGIST EVAL & MGMT  09/20/2022   IR RADIOLOGIST EVAL & MGMT  10/13/2022   IR RADIOLOGIST EVAL & MGMT  10/20/2022   LOWER EXTREMITY ANGIOGRAPHY Left 08/08/2023   Procedure: Lower Extremity Angiography;  Surgeon: Jama Cordella MATSU, MD;  Location: [facility];  Service: Cardiovascular;  Laterality: Left;   LOWER EXTREMITY ANGIOGRAPHY Left 11/08/2023   Procedure: Lower Extremity Angiography;  Surgeon: Jama Cordella MATSU, MD;  Location: [facility];  Service: Cardiovascular;  Laterality: Left;   LOWER EXTREMITY ANGIOGRAPHY Left 11/15/2023   Procedure: Lower Extremity Angiography;  Surgeon: Jama Cordella MATSU, MD;  Location: [facility];  Service: Cardiovascular;  Laterality: Left;   LOWER EXTREMITY INTERVENTION Left 08/08/2023   Procedure: LOWER  EXTREMITY INTERVENTION;  Surgeon: Jama Cordella MATSU, MD;  Location: [facility];  Service: Cardiovascular;  Laterality: Left;   THROMBECTOMY FEMORAL ARTERY Left 11/09/2023   Procedure: THROMBECTOMY, ARTERY, FEMORAL and Popliteal;  Surgeon: Jama Cordella MATSU, MD;  Location: [facility];  Service: Vascular;  Laterality: Left;    History reviewed. No pertinent family history.  Allergies  Allergen Reactions   Bee Venom Anaphylaxis       Latest Ref Rng & Units 11/30/2023    4:56 PM 11/17/2023    4:18 AM 11/16/2023    4:44 AM  CBC  WBC 4.0 - 10.5 K/uL 11.2  12.9  8.1   Hemoglobin 12.0 - 15.0 g/dL 9.5  9.3  87.8   Hematocrit 36.0 - 46.0 % 30.7  27.8  36.5   Platelets 150 - 400 K/uL 545  182  234  CMP     Component Value Date/Time   NA 138 11/30/2023 1656   K 3.7 11/30/2023 1656   CL 103 11/30/2023 1656   CO2 21 (L) 11/30/2023 1656   GLUCOSE 153 (H) 11/30/2023 1656   BUN 6 11/30/2023 1656   CREATININE 0.72 11/30/2023 1656   CREATININE 0.58 09/20/2022 0930   CALCIUM  8.8 (L) 11/30/2023 1656   PROT 8.5 (H) 10/18/2022 1648   ALBUMIN 4.6 10/18/2022 1648   AST 17 10/18/2022 1648   ALT 12 10/18/2022 1648   ALKPHOS 126 10/18/2022 1648   BILITOT 1.4 (H) 10/18/2022 1648   EGFR 109 09/20/2022 0930   GFRNONAA >60 11/30/2023 1656     VAS US  ABI WITH/WO TBI Result Date: 12/18/2023  LOWER EXTREMITY DOPPLER STUDY Patient Name:  Claudia Mills  Date of Exam:   12/14/2023 Medical Rec #: [id number]        Accession #:    [id number] Date of Birth: January 28, 1971        Patient Gender: F Patient Age:   74 years [facility]am [facility] Procedure:      VAS US  ABI WITH/WO TBI Referring Phys: [facility] --------------------------------------------------------------------------------  Indications: Rest pain, gangrene, and peripheral artery disease. High Risk Factors: Hypertension, Diabetes, no history of smoking, prior MI.  Vascular Interventions: 11/16/2023 Left pop to ATA  bpg                          11/08/2023 Stent left EIA, SFA thrombectomy                          08/08/2023 Left SFA and EIA stent. Performing Technologist: Donnice Charnley RVT  Examination Guidelines: A complete evaluation includes at minimum, Doppler waveform signals and systolic blood pressure reading at the level of bilateral brachial, anterior tibial, and posterior tibial arteries, when vessel segments are accessible. Bilateral testing is considered an integral part of a complete examination. Photoelectric Plethysmograph (PPG) waveforms and toe systolic pressure readings are included as required and additional duplex testing as needed. Limited examinations for reoccurring indications may be performed as noted.  ABI Findings: +---------+------------------+-----+----------+--------+ Right    Rt Pressure (mmHg)IndexWaveform  Comment  +---------+------------------+-----+----------+--------+ Brachial 161                                       +---------+------------------+-----+----------+--------+ PTA      73                0.45 monophasic         +---------+------------------+-----+----------+--------+ DP       60                0.37 monophasic         +---------+------------------+-----+----------+--------+ Great Toe0                 0.00 Absent             +---------+------------------+-----+----------+--------+ +---------+------------------+-----+----------+-------+ Left     Lt Pressure (mmHg)IndexWaveform  Comment +---------+------------------+-----+----------+-------+ Brachial 155                                      +---------+------------------+-----+----------+-------+ PTA      126  0.78 monophasic        +---------+------------------+-----+----------+-------+ DP       105               0.65 monophasic        +---------+------------------+-----+----------+-------+ Great Toe80                0.50                    +---------+------------------+-----+----------+-------+ +-------+-----------+-----------+------------+------------+ ABI/TBIToday's ABIToday's TBIPrevious ABIPrevious TBI +-------+-----------+-----------+------------+------------+ Right  0.45       0.00       0.54        0.37         +-------+-----------+-----------+------------+------------+ Left   0.78       0.50       0.44        0.00         +-------+-----------+-----------+------------+------------+ Right ABIs appear essentially unchanged compared to prior study on 08/30/2023. Left ABIs appear increased compared to prior study on 08/30/2023.  Summary: Right: Resting right ankle-brachial index indicates severe right lower extremity arterial disease. The right toe-brachial index is abnormal.  Left: Resting left ankle-brachial index indicates moderate left lower extremity arterial disease. The left toe-brachial index is abnormal.  *See table(s) above for measurements and observations.  Electronically signed by [name] MD on 12/18/2023 at 8:03:22 AM.    Final        Assessment & Plan:   1. Peripheral arterial disease with history of revascularization (Primary) Half the patient's staples were removed today.  She tolerated this fairly well.  She has a very small dehiscence in her groin area and we will have her continue with wet-to-dry to this area.  Will reevaluate at her follow-up.  In addition we will also culture the area.  She started doxycycline today but we will send an additional antibiotics if it is not sensitive to this.  In addition her studies show that she has decreased perfusion in her right lower extremity however with her ongoing wounds we will delay intervention but she is advised to contact us  if she begins to have worsening pain in her right lower extremity as we may need to intervene with an antegrade approach.  Source the patient's feet the venous changes are dry.  She is advised to utilize Betadine to keep this  area dry.  Stomach this looks like it may heal but time will tell if there is improvement.  I discussed with the patient that if it does not improve amputation may be necessary of these toes.  Current Outpatient Medications on File Prior to Visit  Medication Sig Dispense Refill   aspirin  EC 81 MG tablet Take 1 tablet (81 mg total) by mouth daily. Swallow whole. 30 tablet 12   atorvastatin  (LIPITOR) 80 MG tablet Take 80 mg by mouth daily.     BRILINTA  90 MG TABS tablet Take 90 mg by mouth 2 (two) times daily.     clopidogrel (PLAVIX) 75 MG tablet Take 1 tablet (75 mg total) by mouth daily at 6 (six) AM. 30 tablet 11   cyclobenzaprine  (FLEXERIL ) 5 MG tablet Take 1 tablet (5 mg total) by mouth 3 (three) times daily as needed for muscle spasms. 60 tablet 0   doxycycline (VIBRAMYCIN) 100 MG capsule Take 100 mg by mouth 2 (two) times daily.     DULoxetine  (CYMBALTA ) 30 MG capsule Take 30 mg by mouth daily.     empagliflozin (JARDIANCE) 25 MG TABS  tablet Take 25 mg by mouth daily.     gabapentin  (NEURONTIN ) 300 MG capsule Take 300 mg by mouth 3 (three) times daily.     glipiZIDE  (GLUCOTROL ) 5 MG tablet Take 1 tablet (5 mg total) by mouth daily before breakfast. 90 tablet 0   lisinopril  (ZESTRIL ) 20 MG tablet Take 1 tablet (20 mg total) by mouth daily. 30 tablet 1   nitroGLYCERIN  (NITROSTAT ) 0.3 MG SL tablet Place 0.3 mg under the tongue every 5 (five) minutes as needed for chest pain.     oxyCODONE  (OXY IR/ROXICODONE ) 5 MG immediate release tablet Take 1-2 tablets (5-10 mg total) by mouth every 4 (four) hours as needed for moderate pain (pain score 4-6). 30 tablet 0   pantoprazole  (PROTONIX ) 40 MG tablet Take 1 tablet (40 mg total) by mouth daily. 30 tablet 0   atorvastatin  (LIPITOR) 80 MG tablet Take 1 tablet (80 mg total) by mouth daily. (Patient not taking: Reported on 12/20/2023) 30 tablet 11   No current facility-administered medications on file prior to visit.    There are no Patient  Instructions on file for this visit. No follow-ups on file.   Khadija Thier E Corinthian Kemler, NP

## 2023-12-26 ENCOUNTER — Telehealth: Payer: Self-pay | Admitting: Orthopedic Surgery

## 2023-12-26 NOTE — Telephone Encounter (Signed)
 Please call to check on her. Looks like she had more surgery with vascular. Please let me know how she is doing.

## 2023-12-27 NOTE — Telephone Encounter (Signed)
 Patient states she is doing well after vascular surgeries, she is having some left leg pain but not severe. She did say her back Left lower back side has gotten worse in the last 2 weeks maybe, since over doing it in PT about 2 weeks ago. She is going to call her pain management to discuss this and see if they have any feedback on this. No leg pain from back pain, no new weakness or numbness or tingling issues. No urinary or bowel incontinence or perianal numbness. She has Oxycodone  and Flexeril  still at home so she will take that as needed, some days may only need Flexeril  1 tablet up to 3 times daily and may not take Oxycodone  at all. She is still taking Gabapentin  and Lyrica (all 4 of the medications are in her med list and I confirmed the doses and directions). Otherwise she is doing well.

## 2023-12-30 ENCOUNTER — Encounter (INDEPENDENT_AMBULATORY_CARE_PROVIDER_SITE_OTHER): Payer: Self-pay | Admitting: Nurse Practitioner

## 2023-12-30 NOTE — Progress Notes (Signed)
 Subjective:    Patient ID: Claudia Mills, female    DOB: 07/24/1970, 53 y.o.   MRN: [id number] Chief Complaint  Patient presents with   Follow-up    1 week follow up    The patient returns to the office for followup and review status post angiogram with intervention on 11/16/2023.   Procedure: 1. left profunda femoral artery to above-the-knee popliteal artery bypass with 6 mm ringed PTFE. 2. Left popliteal to anterior tibial artery bypass with reversed great saphenous vein. 3. Left profunda femoris endarterectomy. 4. Left popliteal artery endarterectomy. 5. Exploration of the peroneal and the posterior tibial arteries with endarterectomy of the posterior tibial   The patient notes improvement in the lower extremity symptoms. No interval shortening of the patient's claudication distance or rest pain symptoms.  He does have dry gangrenous changes on her left first 3 toes.  In her incision there is some slight dehiscence.  There have been no significant changes to the patient's overall health care.  No documented history of amaurosis fugax or recent TIA symptoms. There are no recent neurological changes noted. No documented history of DVT, PE or superficial thrombophlebitis. The patient denies recent episodes of angina or shortness of breath.   ABI's Rt=0.45 and Lt=0.78  (previous ABI's Rt=0.54 and Lt=0.44) Duplex US  of the bilateral tibial vessels shows monophasic waveforms.    Review of Systems  Skin:  Positive for wound.  All other systems reviewed and are negative.      Objective:   Physical Exam Vitals reviewed.  HENT:     Head: Normocephalic.  Cardiovascular:     Rate and Rhythm: Normal rate.  Pulmonary:     Effort: Pulmonary effort is normal.  Skin:    General: Skin is warm and dry.  Neurological:     Mental Status: She is alert and oriented to person, place, and time.     Motor: Weakness present.     Gait: Gait abnormal.  Psychiatric:        Mood and  Affect: Mood normal.        Behavior: Behavior normal.        Thought Content: Thought content normal.        Judgment: Judgment normal.     BP 104/67   Pulse 82   Resp 16   Wt 130 lb 3.2 oz (59.1 kg)   LMP 09/29/2015 (Approximate) Comment: neg. preg test  BMI 23.06 kg/m   Past Medical History:  Diagnosis Date   Coronary artery disease    Diabetes mellitus without complication (HCC)    Hypertension    MI (myocardial infarction) (HCC)    PVD (peripheral vascular disease)    Tick bite    Pt states she does not remember daye of tic bite, but that is was since last visit with pain clinic and she was hospitalized for 1 day.    Social History   Socioeconomic History   Marital status: Widowed    Spouse name: Not on file   Number of children: Not on file   Years of education: Not on file   Highest education level: Not on file  Occupational History   Not on file  Tobacco Use   Smoking status: Never   Smokeless tobacco: Never  Vaping Use   Vaping status: Never Used  Substance and Sexual Activity   Alcohol use: Yes    Alcohol/week: 12.0 standard drinks of alcohol    Types: 12 Cans of beer per week  Drug use: Yes    Types: Marijuana    Comment: Patient states, I had some this morning.   Sexual activity: Not on file  Other Topics Concern   Not on file  Social History Narrative   Not on file   Social Drivers of Health   Financial Resource Strain: Not on file  Food Insecurity: No Food Insecurity (10/19/2022)   Hunger Vital Sign    Worried About Running Out of Food in the Last Year: Never true    Ran Out of Food in the Last Year: Never true  Transportation Needs: No Transportation Needs (10/19/2022)   PRAPARE - Administrator, Civil Service (Medical): No    Lack of Transportation (Non-Medical): No  Physical Activity: Not on file  Stress: Not on file  Social Connections: Not on file  Intimate Partner Violence: Not At Risk (10/19/2022)   Humiliation, Afraid,  Rape, and Kick questionnaire    Fear of Current or Ex-Partner: No    Emotionally Abused: No    Physically Abused: No    Sexually Abused: No    Past Surgical History:  Procedure Laterality Date   APPLICATION OF CELL SAVER Left 11/09/2023   Procedure: APPLICATION OF CELL SAVER;  Surgeon: Jama Cordella MATSU, MD;  Location: [facility];  Service: Vascular;  Laterality: Left;   APPLICATION OF CELL SAVER N/A 11/16/2023   Procedure: APPLICATION OF CELL SAVER;  Surgeon: Jama Cordella MATSU, MD;  Location: [facility];  Service: Vascular;  Laterality: N/A;   BYPASS GRAFT POPLITEAL TO POPLITEAL  11/16/2023   Procedure: CREATION, BYPASS, ARTERIAL, POPLITEAL;  Surgeon: Jama Cordella MATSU, MD;  Location: [facility];  Service: Vascular;;   BYPASS GRAFT POPLITEAL TO TIBIAL  11/16/2023   Procedure: BYPASS GRAFT POPLITEAL TO TIBIAL (CATHLAB);  Surgeon: Jama Cordella MATSU, MD;  Location: [facility];  Service: Vascular;;   CORONARY ANGIOPLASTY WITH STENT PLACEMENT     ENDARTERECTOMY FEMORAL Left 11/09/2023   Procedure: ENDARTERECTOMY, FEMORAL;  Surgeon: Jama Cordella MATSU, MD;  Location: [facility];  Service: Vascular;  Laterality: Left;   ENDARTERECTOMY FEMORAL  11/16/2023   Procedure: ENDARTERECTOMY, FEMORAL;  Surgeon: Jama Cordella MATSU, MD;  Location: [facility];  Service: Vascular;;   ENDARTERECTOMY POPLITEAL  11/16/2023   Procedure: ENDARTERECTOMY, POPLITEAL;  Surgeon: Jama Cordella MATSU, MD;  Location: [facility];  Service: Vascular;;   ENDARTERECTOMY TIBIOPERONEAL  11/16/2023   Procedure: ENDARTERECTOMY TIBIOPERONEAL (CATHLAB);  Surgeon: Jama Cordella MATSU, MD;  Location: [facility];  Service: Vascular;;   IR KYPHO LUMBAR INC FX REDUCE BONE BX UNI/BIL CANNULATION INC/IMAGING  10/06/2022   IR RADIOLOGIST EVAL & MGMT  09/20/2022   IR RADIOLOGIST EVAL & MGMT  10/13/2022   IR RADIOLOGIST EVAL & MGMT  10/20/2022   LOWER EXTREMITY ANGIOGRAPHY Left 08/08/2023   Procedure: Lower Extremity Angiography;  Surgeon: Jama Cordella MATSU, MD;   Location: [facility];  Service: Cardiovascular;  Laterality: Left;   LOWER EXTREMITY ANGIOGRAPHY Left 11/08/2023   Procedure: Lower Extremity Angiography;  Surgeon: Jama Cordella MATSU, MD;  Location: [facility];  Service: Cardiovascular;  Laterality: Left;   LOWER EXTREMITY ANGIOGRAPHY Left 11/15/2023   Procedure: Lower Extremity Angiography;  Surgeon: Jama Cordella MATSU, MD;  Location: [facility];  Service: Cardiovascular;  Laterality: Left;   LOWER EXTREMITY INTERVENTION Left 08/08/2023   Procedure: LOWER EXTREMITY INTERVENTION;  Surgeon: Jama Cordella MATSU, MD;  Location: [facility];  Service: Cardiovascular;  Laterality: Left;   THROMBECTOMY FEMORAL ARTERY  Left 11/09/2023   Procedure: THROMBECTOMY, ARTERY, FEMORAL and Popliteal;  Surgeon: Jama Cordella MATSU, MD;  Location: [facility];  Service: Vascular;  Laterality: Left;    History reviewed. No pertinent family history.  Allergies  Allergen Reactions   Bee Venom Anaphylaxis       Latest Ref Rng & Units 11/30/2023    4:56 PM 11/17/2023    4:18 AM 11/16/2023    4:44 AM  CBC  WBC 4.0 - 10.5 K/uL 11.2  12.9  8.1   Hemoglobin 12.0 - 15.0 g/dL 9.5  9.3  87.8   Hematocrit 36.0 - 46.0 % 30.7  27.8  36.5   Platelets 150 - 400 K/uL 545  182  234       CMP     Component Value Date/Time   NA 138 11/30/2023 1656   K 3.7 11/30/2023 1656   CL 103 11/30/2023 1656   CO2 21 (L) 11/30/2023 1656   GLUCOSE 153 (H) 11/30/2023 1656   BUN 6 11/30/2023 1656   CREATININE 0.72 11/30/2023 1656   CREATININE 0.58 09/20/2022 0930   CALCIUM  8.8 (L) 11/30/2023 1656   PROT 8.5 (H) 10/18/2022 1648   ALBUMIN 4.6 10/18/2022 1648   AST 17 10/18/2022 1648   ALT 12 10/18/2022 1648   ALKPHOS 126 10/18/2022 1648   BILITOT 1.4 (H) 10/18/2022 1648   EGFR 109 09/20/2022 0930   GFRNONAA >60 11/30/2023 1656     VAS US  ABI WITH/WO TBI Result Date: 12/18/2023  LOWER EXTREMITY DOPPLER STUDY Patient Name:  Saloma Cadena   Date of Exam:   12/14/2023 Medical Rec #: [id number]        Accession #:    [id number] Date of Birth: 09/25/1970        Patient Gender: F Patient Age:   47 years [facility]am [facility] Procedure:      VAS US  ABI WITH/WO TBI Referring Phys: [facility] --------------------------------------------------------------------------------  Indications: Rest pain, gangrene, and peripheral artery disease. High Risk Factors: Hypertension, Diabetes, no history of smoking, prior MI.  Vascular Interventions: 11/16/2023 Left pop to ATA bpg                          11/08/2023 Stent left EIA, SFA thrombectomy                          08/08/2023 Left SFA and EIA stent. Performing Technologist: Donnice Charnley RVT  Examination Guidelines: A complete evaluation includes at minimum, Doppler waveform signals and systolic blood pressure reading at the level of bilateral brachial, anterior tibial, and posterior tibial arteries, when vessel segments are accessible. Bilateral testing is considered an integral part of a complete examination. Photoelectric Plethysmograph (PPG) waveforms and toe systolic pressure readings are included as required and additional duplex testing as needed. Limited examinations for reoccurring indications may be performed as noted.  ABI Findings: +---------+------------------+-----+----------+--------+ Right    Rt Pressure (mmHg)IndexWaveform  Comment  +---------+------------------+-----+----------+--------+ Brachial 161                                       +---------+------------------+-----+----------+--------+ PTA      73                0.45 monophasic         +---------+------------------+-----+----------+--------+ DP  60                0.37 monophasic         +---------+------------------+-----+----------+--------+ Great Toe0                 0.00 Absent             +---------+------------------+-----+----------+--------+  +---------+------------------+-----+----------+-------+ Left     Lt Pressure (mmHg)IndexWaveform  Comment +---------+------------------+-----+----------+-------+ Brachial 155                                      +---------+------------------+-----+----------+-------+ PTA      126               0.78 monophasic        +---------+------------------+-----+----------+-------+ DP       105               0.65 monophasic        +---------+------------------+-----+----------+-------+ Great Toe80                0.50                   +---------+------------------+-----+----------+-------+ +-------+-----------+-----------+------------+------------+ ABI/TBIToday's ABIToday's TBIPrevious ABIPrevious TBI +-------+-----------+-----------+------------+------------+ Right  0.45       0.00       0.54        0.37         +-------+-----------+-----------+------------+------------+ Left   0.78       0.50       0.44        0.00         +-------+-----------+-----------+------------+------------+ Right ABIs appear essentially unchanged compared to prior study on 08/30/2023. Left ABIs appear increased compared to prior study on 08/30/2023.  Summary: Right: Resting right ankle-brachial index indicates severe right lower extremity arterial disease. The right toe-brachial index is abnormal.  Left: Resting left ankle-brachial index indicates moderate left lower extremity arterial disease. The left toe-brachial index is abnormal.  *See table(s) above for measurements and observations.  Electronically signed by [name] MD on 12/18/2023 at 8:03:22 AM.    Final        Assessment & Plan:   1. Peripheral arterial disease with history of revascularization (Primary) Following intervention on the left lower extremity the patient now has significant peripheral arterial disease on the right.  Currently she is not symptomatic.  She does have a wound on the left which is currently healing and  that is suboptimal for any intervention on her right leg at this time.  She was started on doxycycline and so we will culture the wound to ensure there is no other specimen that may not be susceptible to doxycycline.  Will have her return in 1 week for wound evaluation.  She is advised to continue with wet-to-dry dressings in the groin area.  No current intervention for her toes as we will allow things to continue to demarcate.  They are dry gangrenous in nature and so she will continue to clean with Betadine and keep dry.   Current Outpatient Medications on File Prior to Visit  Medication Sig Dispense Refill   aspirin  EC 81 MG tablet Take 1 tablet (81 mg total) by mouth daily. Swallow whole. 30 tablet 12   atorvastatin  (LIPITOR) 80 MG tablet Take 80 mg by mouth daily.     BRILINTA  90 MG TABS tablet Take 90 mg by mouth 2 (two) times daily.  clopidogrel (PLAVIX) 75 MG tablet Take 1 tablet (75 mg total) by mouth daily at 6 (six) AM. 30 tablet 11   cyclobenzaprine  (FLEXERIL ) 5 MG tablet Take 1 tablet (5 mg total) by mouth 3 (three) times daily as needed for muscle spasms. 60 tablet 0   doxycycline (VIBRAMYCIN) 100 MG capsule Take 100 mg by mouth 2 (two) times daily.     DULoxetine  (CYMBALTA ) 30 MG capsule Take 30 mg by mouth daily.     empagliflozin (JARDIANCE) 25 MG TABS tablet Take 25 mg by mouth daily.     gabapentin  (NEURONTIN ) 300 MG capsule Take 300 mg by mouth 3 (three) times daily.     glipiZIDE  (GLUCOTROL ) 5 MG tablet Take 1 tablet (5 mg total) by mouth daily before breakfast. 90 tablet 0   lisinopril  (ZESTRIL ) 20 MG tablet Take 1 tablet (20 mg total) by mouth daily. 30 tablet 1   nitroGLYCERIN  (NITROSTAT ) 0.3 MG SL tablet Place 0.3 mg under the tongue every 5 (five) minutes as needed for chest pain.     oxyCODONE  (OXY IR/ROXICODONE ) 5 MG immediate release tablet Take 1-2 tablets (5-10 mg total) by mouth every 4 (four) hours as needed for moderate pain (pain score 4-6). 30 tablet 0    pantoprazole  (PROTONIX ) 40 MG tablet Take 1 tablet (40 mg total) by mouth daily. 30 tablet 0   atorvastatin  (LIPITOR) 80 MG tablet Take 1 tablet (80 mg total) by mouth daily. (Patient not taking: Reported on 12/20/2023) 30 tablet 11   No current facility-administered medications on file prior to visit.    There are no Patient Instructions on file for this visit. No follow-ups on file.   Sahmya Arai E Kiyana Vazguez, NP

## 2024-01-02 ENCOUNTER — Ambulatory Visit (INDEPENDENT_AMBULATORY_CARE_PROVIDER_SITE_OTHER): Admitting: Nurse Practitioner

## 2024-01-02 ENCOUNTER — Encounter (INDEPENDENT_AMBULATORY_CARE_PROVIDER_SITE_OTHER): Payer: Self-pay | Admitting: Nurse Practitioner

## 2024-01-02 VITALS — BP 137/84 | HR 118 | Resp 18 | Ht 63.0 in | Wt 129.6 lb

## 2024-01-02 DIAGNOSIS — Z9889 Other specified postprocedural states: Secondary | ICD-10-CM

## 2024-01-02 DIAGNOSIS — I739 Peripheral vascular disease, unspecified: Secondary | ICD-10-CM

## 2024-01-06 ENCOUNTER — Encounter (INDEPENDENT_AMBULATORY_CARE_PROVIDER_SITE_OTHER): Payer: Self-pay | Admitting: Nurse Practitioner

## 2024-01-06 NOTE — Progress Notes (Signed)
 SUBJECTIVE:  Patient ID: Claudia Mills, female    DOB: 11-Sep-1970, 53 y.o.   MRN: [id number] Chief Complaint  Patient presents with   Follow-up    2 week follow up     Discussed the use of AI scribe software for clinical note transcription with the patient, who gave verbal consent to proceed.  History of Present Illness Claudia Mills is a 53 year old female who presents with followup and review status post angiogram with intervention on 11/16/2023.    Procedure: 1.left profunda femoral artery to above-the-knee popliteal artery bypass with 6 mm ringed PTFE. 2.Left popliteal to anterior tibial artery bypass with reversed great saphenous vein. 3.Left profunda femoris endarterectomy. 4.Left popliteal artery endarterectomy. 5.Exploration of the peroneal and the posterior tibial arteries with endarterectomy of the posterior tibial  She returns today for wound evaluation.  She still continues to have gangrenous changes on the left foot.  There appears to be some beginning of demarcation on the great toe.  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  She continues to have a dehiscence at her groin area with some fibrinous exudate.  She was advised to utilize wet-to-dry dressings but recently began using a nonadherent dressing which is increased some of the fibrinous exudate.  Today we will fill out paperwork to try to get the patient qualified for Santyl ointment.     Results Procedure: Staple removal Description: Staple located and removed from the site.  Past Medical History:  Diagnosis Date   Coronary artery disease    Diabetes mellitus without complication (HCC)    Hypertension    MI (myocardial infarction) (HCC)    PVD (peripheral vascular disease)    Tick bite    Pt states she does not remember daye of tic bite, but that is was since last visit with pain clinic and she was  hospitalized for 1 day.    Past Surgical History:  Procedure Laterality Date   APPLICATION OF CELL SAVER Left 11/09/2023   Procedure: APPLICATION OF CELL SAVER;  Surgeon: Jama Cordella MATSU, MD;  Location: [facility];  Service: Vascular;  Laterality: Left;   APPLICATION OF CELL SAVER N/A 11/16/2023   Procedure: APPLICATION OF CELL SAVER;  Surgeon: Jama Cordella MATSU, MD;  Location: [facility];  Service: Vascular;  Laterality: N/A;   BYPASS GRAFT POPLITEAL TO POPLITEAL  11/16/2023   Procedure: CREATION, BYPASS, ARTERIAL, POPLITEAL;  Surgeon: Jama Cordella MATSU, MD;  Location: [facility];  Service: Vascular;;   BYPASS GRAFT POPLITEAL TO TIBIAL  11/16/2023   Procedure: BYPASS GRAFT POPLITEAL TO TIBIAL (CATHLAB);  Surgeon: Jama Cordella MATSU, MD;  Location: [facility];  Service: Vascular;;   CORONARY ANGIOPLASTY WITH STENT PLACEMENT     ENDARTERECTOMY FEMORAL Left 11/09/2023   Procedure: ENDARTERECTOMY, FEMORAL;  Surgeon: Jama Cordella MATSU, MD;  Location: [facility];  Service: Vascular;  Laterality: Left;   ENDARTERECTOMY FEMORAL  11/16/2023   Procedure: ENDARTERECTOMY, FEMORAL;  Surgeon: Jama Cordella MATSU, MD;  Location: [facility];  Service: Vascular;;   ENDARTERECTOMY POPLITEAL  11/16/2023   Procedure: ENDARTERECTOMY, POPLITEAL;  Surgeon: Jama Cordella MATSU, MD;  Location: [facility];  Service: Vascular;;   ENDARTERECTOMY TIBIOPERONEAL  11/16/2023   Procedure: ENDARTERECTOMY TIBIOPERONEAL (CATHLAB);  Surgeon: Jama Cordella MATSU, MD;  Location: [facility];  Service: Vascular;;   IR KYPHO LUMBAR INC FX REDUCE BONE BX UNI/BIL CANNULATION INC/IMAGING  10/06/2022  IR RADIOLOGIST EVAL & MGMT  09/20/2022   IR RADIOLOGIST EVAL & MGMT  10/13/2022   IR RADIOLOGIST EVAL & MGMT  10/20/2022   LOWER EXTREMITY ANGIOGRAPHY Left 08/08/2023   Procedure: Lower Extremity Angiography;  Surgeon: Jama Cordella MATSU, MD;  Location: [facility];  Service: Cardiovascular;  Laterality: Left;   LOWER EXTREMITY ANGIOGRAPHY Left  11/08/2023   Procedure: Lower Extremity Angiography;  Surgeon: Jama Cordella MATSU, MD;  Location: [facility];  Service: Cardiovascular;  Laterality: Left;   LOWER EXTREMITY ANGIOGRAPHY Left 11/15/2023   Procedure: Lower Extremity Angiography;  Surgeon: Jama Cordella MATSU, MD;  Location: [facility];  Service: Cardiovascular;  Laterality: Left;   LOWER EXTREMITY INTERVENTION Left 08/08/2023   Procedure: LOWER EXTREMITY INTERVENTION;  Surgeon: Jama Cordella MATSU, MD;  Location: [facility];  Service: Cardiovascular;  Laterality: Left;   THROMBECTOMY FEMORAL ARTERY Left 11/09/2023   Procedure: THROMBECTOMY, ARTERY, FEMORAL and Popliteal;  Surgeon: Jama Cordella MATSU, MD;  Location: [facility];  Service: Vascular;  Laterality: Left;    Social History   Socioeconomic History   Marital status: Widowed    Spouse name: Not on file   Number of children: Not on file   Years of education: Not on file   Highest education level: Not on file  Occupational History   Not on file  Tobacco Use   Smoking status: Never   Smokeless tobacco: Never  Vaping Use   Vaping status: Never Used  Substance and Sexual Activity   Alcohol use: Yes    Alcohol/week: 12.0 standard drinks of alcohol    Types: 12 Cans of beer per week   Drug use: Yes    Types: Marijuana    Comment: Patient states, I had some this morning.   Sexual activity: Not on file  Other Topics Concern   Not on file  Social History Narrative   Not on file   Social Drivers of Health   Financial Resource Strain: Not on file  Food Insecurity: No Food Insecurity (10/19/2022)   Hunger Vital Sign    Worried About Running Out of Food in the Last Year: Never true    Ran Out of Food in the Last Year: Never true  Transportation Needs: No Transportation Needs (10/19/2022)   PRAPARE - Administrator, Civil Service (Medical): No    Lack of Transportation (Non-Medical): No  Physical Activity: Not on file  Stress: Not  on file  Social Connections: Not on file  Intimate Partner Violence: Not At Risk (10/19/2022)   Humiliation, Afraid, Rape, and Kick questionnaire    Fear of Current or Ex-Partner: No    Emotionally Abused: No    Physically Abused: No    Sexually Abused: No    History reviewed. No pertinent family history.  Allergies  Allergen Reactions   Bee Venom Anaphylaxis     Review of Systems   Review of Systems: Negative Unless Checked Constitutional: [] Weight loss  [] Fever  [] Chills Cardiac: [] Chest pain   []  Atrial Fibrillation  [] Palpitations   [] Shortness of breath when laying flat   [] Shortness of breath with exertion. [] Shortness of breath at rest Vascular:  [] Pain in legs with walking   [] Pain in legs with standing [] Pain in legs when laying flat   [] Claudication    [] Pain in feet when laying flat    [] History of DVT   [] Phlebitis   [] Swelling in legs   [] Varicose veins   [] Non-healing ulcers  Pulmonary:   [] Uses home oxygen   [] Productive cough   [] Hemoptysis   [] Wheeze  [] COPD   [] Asthma Neurologic:  [] Dizziness   [] Seizures  [] Blackouts [] History of stroke   [] History of TIA  [] Aphasia   [] Temporary Blindness   [] Weakness or numbness in arm   [x] Weakness or numbness in leg Musculoskeletal:   [] Joint swelling   [] Joint pain   [] Low back pain  []  History of Knee Replacement [] Arthritis [] back Surgeries  []  Spinal Stenosis    Hematologic:  [] Easy bruising  [] Easy bleeding   [] Hypercoagulable state   [] Anemic Gastrointestinal:  [] Diarrhea   [] Vomiting  [] Gastroesophageal reflux/heartburn   [] Difficulty swallowing. [] Abdominal pain Genitourinary:  [] Chronic kidney disease   [] Difficult urination  [] Anuric   [] Blood in urine [] Frequent urination  [] Burning with urination   [] Hematuria Skin:  [] Rashes   [] Ulcers [x] Wounds Psychological:  [] History of anxiety   []  History of major depression  []  Memory Difficulties      OBJECTIVE:     BP 137/84 (BP Location: Left Arm, Patient Position:  Sitting, Cuff Size: Normal)   Pulse (!) 118   Resp 18   Ht 5' 3 (1.6 m)   Wt 129 lb 9.6 oz (58.8 kg)   LMP 09/29/2015 (Approximate) Comment: neg. preg test  BMI 22.96 kg/m   Physical Exam Vitals reviewed.  HENT:     Head: Normocephalic.  Cardiovascular:     Rate and Rhythm: Normal rate.  Pulmonary:     Effort: Pulmonary effort is normal.  Skin:    General: Skin is warm and dry.  Neurological:     Mental Status: She is alert and oriented to person, place, and time.     Motor: Weakness present.     Gait: Gait abnormal.  Psychiatric:        Mood and Affect: Mood normal.        Behavior: Behavior normal.        Thought Content: Thought content normal.        Judgment: Judgment normal.    Physical Exam SKIN: One skin wound healing well, another problematic. Scab detaching from skin. Area sensitive due to incisions and nerve involvement. Concern for wound in groin area. Wounds closed, not draining.   CMP     Component Value Date/Time   NA 138 11/30/2023 1656   K 3.7 11/30/2023 1656   CL 103 11/30/2023 1656   CO2 21 (L) 11/30/2023 1656   GLUCOSE 153 (H) 11/30/2023 1656   BUN 6 11/30/2023 1656   CREATININE 0.72 11/30/2023 1656   CREATININE 0.58 09/20/2022 0930   CALCIUM  8.8 (L) 11/30/2023 1656   PROT 8.5 (H) 10/18/2022 1648   ALBUMIN 4.6 10/18/2022 1648   AST 17 10/18/2022 1648   ALT 12 10/18/2022 1648   ALKPHOS 126 10/18/2022 1648   BILITOT 1.4 (H) 10/18/2022 1648   EGFR 109 09/20/2022 0930   GFRNONAA >60 11/30/2023 1656    VAS US  ABI WITH/WO TBI Result Date: 12/18/2023  LOWER EXTREMITY DOPPLER STUDY Patient Name:  Claudia Mills  Date of Exam:   12/14/2023 Medical Rec #: [id number]        Accession #:    [id number] Date of Birth: 1971/01/02        Patient Gender: F Patient Age:   75 years Exam Location:  [facility] Procedure:      VAS US  ABI WITH/WO TBI Referring Phys: [facility]  --------------------------------------------------------------------------------  Indications: Rest pain, gangrene, and peripheral artery  disease. High Risk Factors: Hypertension, Diabetes, no history of smoking, prior MI.  Vascular Interventions: 11/16/2023 Left pop to ATA bpg                          11/08/2023 Stent left EIA, SFA thrombectomy                          08/08/2023 Left SFA and EIA stent. Performing Technologist: Donnice Charnley RVT  Examination Guidelines: A complete evaluation includes at minimum, Doppler waveform signals and systolic blood pressure reading at the level of bilateral brachial, anterior tibial, and posterior tibial arteries, when vessel segments are accessible. Bilateral testing is considered an integral part of a complete examination. Photoelectric Plethysmograph (PPG) waveforms and toe systolic pressure readings are included as required and additional duplex testing as needed. Limited examinations for reoccurring indications may be performed as noted.  ABI Findings: +---------+------------------+-----+----------+--------+ Right    Rt Pressure (mmHg)IndexWaveform  Comment  +---------+------------------+-----+----------+--------+ Brachial 161                                       +---------+------------------+-----+----------+--------+ PTA      73                0.45 monophasic         +---------+------------------+-----+----------+--------+ DP       60                0.37 monophasic         +---------+------------------+-----+----------+--------+ Great Toe0                 0.00 Absent             +---------+------------------+-----+----------+--------+ +---------+------------------+-----+----------+-------+ Left     Lt Pressure (mmHg)IndexWaveform  Comment +---------+------------------+-----+----------+-------+ Brachial 155                                      +---------+------------------+-----+----------+-------+ PTA      126                0.78 monophasic        +---------+------------------+-----+----------+-------+ DP       105               0.65 monophasic        +---------+------------------+-----+----------+-------+ Great Toe80                0.50                   +---------+------------------+-----+----------+-------+ +-------+-----------+-----------+------------+------------+ ABI/TBIToday's ABIToday's TBIPrevious ABIPrevious TBI +-------+-----------+-----------+------------+------------+ Right  0.45       0.00       0.54        0.37         +-------+-----------+-----------+------------+------------+ Left   0.78       0.50       0.44        0.00         +-------+-----------+-----------+------------+------------+ Right ABIs appear essentially unchanged compared to prior study on 08/30/2023. Left ABIs appear increased compared to prior study on 08/30/2023.  Summary: Right: Resting right ankle-brachial index indicates severe right lower extremity arterial disease. The right toe-brachial index is abnormal.  Left: Resting left ankle-brachial index indicates moderate left lower extremity arterial  disease. The left toe-brachial index is abnormal.  *See table(s) above for measurements and observations.  Electronically signed by [name] MD on 12/18/2023 at 8:03:22 AM.    Final        ASSESSMENT AND PLAN:  1. Peripheral arterial disease with history of revascularization (Primary) Peripheral arterial disease with chronic lower extremity wounds and chronic post-surgical wound disruption Chronic lower extremity wounds with post-surgical wound disruption. Wounds not draining or infected. Concerns about potential toe loss due to poor healing. Groin wound location poses infection risk. Nonstick gauze ineffective for debridement; wet to dry dressing recommended. Santyl dressing to be used once available. - Continue Betadine for wound cleaning. - Use wet to dry dressing with saline until Santyl dressing is  available. - Apply Santal dressing with a nickel-sized amount once clean, change daily. - Monitor for signs of infection such as pus or odor. - Allow wounds to air out except for the groin wound, which should remain covered. - Ordered physical therapy, preferably home-based if possible. - Scheduled follow-up in two weeks. - Patient will require right lower extremity intervention once left groin is healed     Current Outpatient Medications on File Prior to Visit  Medication Sig Dispense Refill   aspirin  EC 81 MG tablet Take 1 tablet (81 mg total) by mouth daily. Swallow whole. 30 tablet 12   atorvastatin  (LIPITOR) 80 MG tablet Take 80 mg by mouth daily.     atorvastatin  (LIPITOR) 80 MG tablet Take 1 tablet (80 mg total) by mouth daily. 30 tablet 11   BRILINTA  90 MG TABS tablet Take 90 mg by mouth 2 (two) times daily.     clopidogrel  (PLAVIX ) 75 MG tablet Take 1 tablet (75 mg total) by mouth daily at 6 (six) AM. 30 tablet 11   cyclobenzaprine  (FLEXERIL ) 5 MG tablet Take 1 tablet (5 mg total) by mouth 3 (three) times daily as needed for muscle spasms. 60 tablet 0   doxycycline (VIBRAMYCIN) 100 MG capsule Take 100 mg by mouth 2 (two) times daily.     DULoxetine  (CYMBALTA ) 30 MG capsule Take 30 mg by mouth daily.     empagliflozin (JARDIANCE) 25 MG TABS tablet Take 25 mg by mouth daily.     gabapentin  (NEURONTIN ) 300 MG capsule Take 300 mg by mouth 3 (three) times daily.     glipiZIDE  (GLUCOTROL ) 5 MG tablet Take 1 tablet (5 mg total) by mouth daily before breakfast. 90 tablet 0   lisinopril  (ZESTRIL ) 20 MG tablet Take 1 tablet (20 mg total) by mouth daily. 30 tablet 1   nitroGLYCERIN  (NITROSTAT ) 0.3 MG SL tablet Place 0.3 mg under the tongue every 5 (five) minutes as needed for chest pain.     oxyCODONE  (OXY IR/ROXICODONE ) 5 MG immediate release tablet Take 1-2 tablets (5-10 mg total) by mouth every 4 (four) hours as needed for moderate pain (pain score 4-6). 30 tablet 0   pantoprazole   (PROTONIX ) 40 MG tablet Take 1 tablet (40 mg total) by mouth daily. 30 tablet 0   No current facility-administered medications on file prior to visit.    There are no Patient Instructions on file for this visit. No follow-ups on file.   Jian Hodgman E Mackenzye Mackel, NP  This note was completed with Office Manager.  Any errors are purely unintentional.

## 2024-01-10 ENCOUNTER — Telehealth (INDEPENDENT_AMBULATORY_CARE_PROVIDER_SITE_OTHER): Payer: Self-pay

## 2024-01-10 NOTE — Telephone Encounter (Signed)
 Patient reach out asking how much santyl ointment she should used on her wound? Also what would best to used to clean the wound sodium chloride , antibacterial soap, or peroxide? Patient was advised to apply enough to cover the wound at nickel thick and to use sodium chloride  to clean the wound. Patient verbalized understanding to medical advice.

## 2024-01-16 ENCOUNTER — Encounter (INDEPENDENT_AMBULATORY_CARE_PROVIDER_SITE_OTHER): Payer: Self-pay | Admitting: Nurse Practitioner

## 2024-01-16 ENCOUNTER — Ambulatory Visit (INDEPENDENT_AMBULATORY_CARE_PROVIDER_SITE_OTHER): Admitting: Nurse Practitioner

## 2024-01-16 VITALS — BP 139/83 | HR 112 | Resp 18 | Ht 63.0 in | Wt 135.0 lb

## 2024-01-16 DIAGNOSIS — Z9889 Other specified postprocedural states: Secondary | ICD-10-CM

## 2024-01-16 DIAGNOSIS — M47816 Spondylosis without myelopathy or radiculopathy, lumbar region: Secondary | ICD-10-CM

## 2024-01-16 DIAGNOSIS — I739 Peripheral vascular disease, unspecified: Secondary | ICD-10-CM

## 2024-01-16 MED ORDER — CYCLOBENZAPRINE HCL 5 MG PO TABS: 5.0000 mg | ORAL_TABLET | Freq: Three times a day (TID) | ORAL | 0 refills | Status: AC | PRN

## 2024-01-16 MED ORDER — OXYCODONE HCL 5 MG PO TABS: 5.0000 mg | ORAL_TABLET | Freq: Four times a day (QID) | ORAL | 0 refills | Status: DC | PRN

## 2024-01-16 NOTE — Progress Notes (Signed)
 Subjective:    Patient ID: Claudia Mills, female    DOB: 01-12-1971, 53 y.o.   MRN: [id number] Chief Complaint  Patient presents with   Follow-up    2 week follow up    HPI  Discussed the use of AI scribe software for clinical note transcription with the patient, who gave verbal consent to proceed.  History of Present Illness [name] is a 53 year old female with peripheral arterial disease who presents for follow-up of foot wounds and medication management.  She has been experiencing a foot wound for the past week, which is improving with treatment. The wound has a sloughy area that is getting smaller, with no drainage or odor. She is keeping it clean with Betadine.  She describes a stabbing sensation in her foot, particularly from the big toe, which causes muscle movement in her leg. She pulled off a piece of black skin from her big toe. Her feet stay cold, and she experiences pain in the back of her leg when walking, indicating possible circulation issues.  She has been using oxycodone  5 mg as needed for pain, with a prescription of 30 tablets lasting almost two months. She is running low on muscle relaxers and pain medication, with only two pain pills left.  She discusses her sleep habits, noting that she tosses and turns and sometimes sleeps with her hand between her knees, which may affect her wounds.   The first 2 toes on her left foot have significant gangrenous changes.  This has been stable from a week.  She continues not to have any drainage or foul-smelling odor from this area.  We also noted the patient has some significant PAD in the right leg but at this time it is stable without significant pain or issues.    Results     Review of Systems  Skin:  Positive for wound.  All other systems reviewed and are negative.      Objective:   Physical Exam Vitals reviewed.  HENT:     Head: Normocephalic.  Cardiovascular:     Rate and Rhythm: Normal rate.   Pulmonary:     Effort: Pulmonary effort is normal.  Skin:    General: Skin is warm and dry.  Neurological:     Mental Status: She is alert and oriented to person, place, and time.     Gait: Gait abnormal.  Psychiatric:        Mood and Affect: Mood normal.        Behavior: Behavior normal.        Thought Content: Thought content normal.        Judgment: Judgment normal.     Physical Exam EXTREMITIES: Foot wound improving with decreasing sloughing area and scab formation.   BP 139/83 (BP Location: Right Arm, Patient Position: Sitting, Cuff Size: Normal)   Pulse (!) 112   Resp 18   Ht 5' 3 (1.6 m)   Wt 135 lb (61.2 kg)   LMP 09/29/2015 (Approximate) Comment: neg. preg test  BMI 23.91 kg/m   Past Medical History:  Diagnosis Date   Coronary artery disease    Diabetes mellitus without complication (HCC)    Hypertension    MI (myocardial infarction) (HCC)    PVD (peripheral vascular disease)    Tick bite    Pt states she does not remember daye of tic bite, but that is was since last visit with pain clinic and she was hospitalized for 1 day.  Social History   Socioeconomic History   Marital status: Widowed    Spouse name: Not on file   Number of children: Not on file   Years of education: Not on file   Highest education level: Not on file  Occupational History   Not on file  Tobacco Use   Smoking status: Never   Smokeless tobacco: Never  Vaping Use   Vaping status: Never Used  Substance and Sexual Activity   Alcohol use: Yes    Alcohol/week: 12.0 standard drinks of alcohol    Types: 12 Cans of beer per week   Drug use: Yes    Types: Marijuana    Comment: Patient states, I had some this morning.   Sexual activity: Not on file  Other Topics Concern   Not on file  Social History Narrative   Not on file   Social Drivers of Health   Financial Resource Strain: Not on file  Food Insecurity: No Food Insecurity (10/19/2022)   Hunger Vital Sign    Worried  About Running Out of Food in the Last Year: Never true    Ran Out of Food in the Last Year: Never true  Transportation Needs: No Transportation Needs (10/19/2022)   PRAPARE - Administrator, Civil Service (Medical): No    Lack of Transportation (Non-Medical): No  Physical Activity: Not on file  Stress: Not on file  Social Connections: Not on file  Intimate Partner Violence: Not At Risk (10/19/2022)   Humiliation, Afraid, Rape, and Kick questionnaire    Fear of Current or Ex-Partner: No    Emotionally Abused: No    Physically Abused: No    Sexually Abused: No    Past Surgical History:  Procedure Laterality Date   APPLICATION OF CELL SAVER Left 11/09/2023   Procedure: APPLICATION OF CELL SAVER;  Surgeon: Jama Cordella MATSU, MD;  Location: [facility];  Service: Vascular;  Laterality: Left;   APPLICATION OF CELL SAVER N/A 11/16/2023   Procedure: APPLICATION OF CELL SAVER;  Surgeon: Jama Cordella MATSU, MD;  Location: [facility];  Service: Vascular;  Laterality: N/A;   BYPASS GRAFT POPLITEAL TO POPLITEAL  11/16/2023   Procedure: CREATION, BYPASS, ARTERIAL, POPLITEAL;  Surgeon: Jama Cordella MATSU, MD;  Location: [facility];  Service: Vascular;;   BYPASS GRAFT POPLITEAL TO TIBIAL  11/16/2023   Procedure: BYPASS GRAFT POPLITEAL TO TIBIAL (CATHLAB);  Surgeon: Jama Cordella MATSU, MD;  Location: [facility];  Service: Vascular;;   CORONARY ANGIOPLASTY WITH STENT PLACEMENT     ENDARTERECTOMY FEMORAL Left 11/09/2023   Procedure: ENDARTERECTOMY, FEMORAL;  Surgeon: Jama Cordella MATSU, MD;  Location: [facility];  Service: Vascular;  Laterality: Left;   ENDARTERECTOMY FEMORAL  11/16/2023   Procedure: ENDARTERECTOMY, FEMORAL;  Surgeon: Jama Cordella MATSU, MD;  Location: [facility];  Service: Vascular;;   ENDARTERECTOMY POPLITEAL  11/16/2023   Procedure: ENDARTERECTOMY, POPLITEAL;  Surgeon: Jama Cordella MATSU, MD;  Location: [facility];  Service: Vascular;;   ENDARTERECTOMY TIBIOPERONEAL  11/16/2023   Procedure:  ENDARTERECTOMY TIBIOPERONEAL (CATHLAB);  Surgeon: Jama Cordella MATSU, MD;  Location: [facility];  Service: Vascular;;   IR KYPHO LUMBAR INC FX REDUCE BONE BX UNI/BIL CANNULATION INC/IMAGING  10/06/2022   IR RADIOLOGIST EVAL & MGMT  09/20/2022   IR RADIOLOGIST EVAL & MGMT  10/13/2022   IR RADIOLOGIST EVAL & MGMT  10/20/2022   LOWER EXTREMITY ANGIOGRAPHY Left 08/08/2023   Procedure: Lower Extremity Angiography;  Surgeon: Jama Cordella MATSU, MD;  Location: [facility];  Service: Cardiovascular;  Laterality: Left;   LOWER EXTREMITY ANGIOGRAPHY Left 11/08/2023   Procedure: Lower Extremity Angiography;  Surgeon: Jama Cordella MATSU, MD;  Location: [facility];  Service: Cardiovascular;  Laterality: Left;   LOWER EXTREMITY ANGIOGRAPHY Left 11/15/2023   Procedure: Lower Extremity Angiography;  Surgeon: Jama Cordella MATSU, MD;  Location: [facility];  Service: Cardiovascular;  Laterality: Left;   LOWER EXTREMITY INTERVENTION Left 08/08/2023   Procedure: LOWER EXTREMITY INTERVENTION;  Surgeon: Jama Cordella MATSU, MD;  Location: [facility];  Service: Cardiovascular;  Laterality: Left;   THROMBECTOMY FEMORAL ARTERY Left 11/09/2023   Procedure: THROMBECTOMY, ARTERY, FEMORAL and Popliteal;  Surgeon: Jama Cordella MATSU, MD;  Location: [facility];  Service: Vascular;  Laterality: Left;    History reviewed. No pertinent family history.  Allergies  Allergen Reactions   Bee Venom Anaphylaxis       Latest Ref Rng & Units 11/30/2023    4:56 PM 11/17/2023    4:18 AM 11/16/2023    4:44 AM  CBC  WBC 4.0 - 10.5 K/uL 11.2  12.9  8.1   Hemoglobin 12.0 - 15.0 g/dL 9.5  9.3  87.8   Hematocrit 36.0 - 46.0 % 30.7  27.8  36.5   Platelets 150 - 400 K/uL 545  182  234       CMP     Component Value Date/Time   NA 138 11/30/2023 1656   K 3.7 11/30/2023 1656   CL 103 11/30/2023 1656   CO2 21 (L) 11/30/2023 1656   GLUCOSE 153 (H) 11/30/2023 1656   BUN 6 11/30/2023 1656   CREATININE 0.72  11/30/2023 1656   CREATININE 0.58 09/20/2022 0930   CALCIUM  8.8 (L) 11/30/2023 1656   PROT 8.5 (H) 10/18/2022 1648   ALBUMIN 4.6 10/18/2022 1648   AST 17 10/18/2022 1648   ALT 12 10/18/2022 1648   ALKPHOS 126 10/18/2022 1648   BILITOT 1.4 (H) 10/18/2022 1648   EGFR 109 09/20/2022 0930   GFRNONAA >60 11/30/2023 1656     VAS US  ABI WITH/WO TBI Result Date: 12/18/2023  LOWER EXTREMITY DOPPLER STUDY Patient Name:  Wilhelmena Zea  Date of Exam:   12/14/2023 Medical Rec #: [id number]        Accession #:    [id number] Date of Birth: 10/24/1970        Patient Gender: F Patient Age:   20 years Exam Location:  [facility] Procedure:      VAS US  ABI WITH/WO TBI Referring Phys: [facility] --------------------------------------------------------------------------------  Indications: Rest pain, gangrene, and peripheral artery disease. High Risk Factors: Hypertension, Diabetes, no history of smoking, prior MI.  Vascular Interventions: 11/16/2023 Left pop to ATA bpg                          11/08/2023 Stent left EIA, SFA thrombectomy                          08/08/2023 Left SFA and EIA stent. Performing Technologist: Donnice Charnley RVT  Examination Guidelines: A complete evaluation includes at minimum, Doppler waveform signals and systolic blood pressure reading at the level of bilateral brachial, anterior tibial, and posterior tibial arteries, when vessel segments are accessible. Bilateral testing is considered an integral part of a complete examination. Photoelectric Plethysmograph (PPG) waveforms and toe systolic pressure readings are included as required and additional duplex testing as needed. Limited examinations  for reoccurring indications may be performed as noted.  ABI Findings: +---------+------------------+-----+----------+--------+ Right    Rt Pressure (mmHg)IndexWaveform  Comment  +---------+------------------+-----+----------+--------+ Brachial 161                                        +---------+------------------+-----+----------+--------+ PTA      73                0.45 monophasic         +---------+------------------+-----+----------+--------+ DP       60                0.37 monophasic         +---------+------------------+-----+----------+--------+ Great Toe0                 0.00 Absent             +---------+------------------+-----+----------+--------+ +---------+------------------+-----+----------+-------+ Left     Lt Pressure (mmHg)IndexWaveform  Comment +---------+------------------+-----+----------+-------+ Brachial 155                                      +---------+------------------+-----+----------+-------+ PTA      126               0.78 monophasic        +---------+------------------+-----+----------+-------+ DP       105               0.65 monophasic        +---------+------------------+-----+----------+-------+ Great Toe80                0.50                   +---------+------------------+-----+----------+-------+ +-------+-----------+-----------+------------+------------+ ABI/TBIToday's ABIToday's TBIPrevious ABIPrevious TBI +-------+-----------+-----------+------------+------------+ Right  0.45       0.00       0.54        0.37         +-------+-----------+-----------+------------+------------+ Left   0.78       0.50       0.44        0.00         +-------+-----------+-----------+------------+------------+ Right ABIs appear essentially unchanged compared to prior study on 08/30/2023. Left ABIs appear increased compared to prior study on 08/30/2023.  Summary: Right: Resting right ankle-brachial index indicates severe right lower extremity arterial disease. The right toe-brachial index is abnormal.  Left: Resting left ankle-brachial index indicates moderate left lower extremity arterial disease. The left toe-brachial index is abnormal.  *See table(s) above for measurements and observations.   Electronically signed by [name] MD on 12/18/2023 at 8:03:22 AM.    Final        Assessment & Plan:    2. Peripheral arterial disease with history of revascularization (Primary) Peripheral arterial disease with lower extremity ulcer and history of revascularization Ulcer improving with reduced sloughing, no drainage or odor. Intermittent stabbing sensations in big toe likely due to nerve recovery. Foot remains cold, indicating potential reduced blood flow. Groin wound healing; knee wound concerning due to appearance and location. - Continue Betadine for cleaning gangrenous toes. - Apply Santal ointment to incision and open areas. - Monitor for wound opening; apply Santal if it opens. - Scheduled follow-up in two weeks. - Instructed to contact if any problems arise before next appointment.   2. Lumbar facet arthropathy Typically  this is filled by neurosurgery.  The patient notes that she does need to follow-up with them but is not able to do so with her current wound status.  I have sent a refill but further refills will need to come from neurosurgery. - cyclobenzaprine  (FLEXERIL ) 5 MG tablet; Take 1 tablet (5 mg total) by mouth 3 (three) times daily as needed for muscle spasms.  Dispense: 60 tablet; Refill: 0  Current Outpatient Medications on File Prior to Visit  Medication Sig Dispense Refill   aspirin  EC 81 MG tablet Take 1 tablet (81 mg total) by mouth daily. Swallow whole. 30 tablet 12   atorvastatin  (LIPITOR) 80 MG tablet Take 80 mg by mouth daily.     atorvastatin  (LIPITOR) 80 MG tablet Take 1 tablet (80 mg total) by mouth daily. 30 tablet 11   BRILINTA  90 MG TABS tablet Take 90 mg by mouth 2 (two) times daily.     clopidogrel  (PLAVIX ) 75 MG tablet Take 1 tablet (75 mg total) by mouth daily at 6 (six) AM. 30 tablet 11   doxycycline (VIBRAMYCIN) 100 MG capsule Take 100 mg by mouth 2 (two) times daily.     DULoxetine  (CYMBALTA ) 30 MG capsule Take 30 mg by mouth daily.      empagliflozin (JARDIANCE) 25 MG TABS tablet Take 25 mg by mouth daily.     gabapentin  (NEURONTIN ) 300 MG capsule Take 300 mg by mouth 3 (three) times daily.     glipiZIDE  (GLUCOTROL ) 5 MG tablet Take 1 tablet (5 mg total) by mouth daily before breakfast. 90 tablet 0   lisinopril  (ZESTRIL ) 20 MG tablet Take 1 tablet (20 mg total) by mouth daily. 30 tablet 1   nitroGLYCERIN  (NITROSTAT ) 0.3 MG SL tablet Place 0.3 mg under the tongue every 5 (five) minutes as needed for chest pain.     pantoprazole  (PROTONIX ) 40 MG tablet Take 1 tablet (40 mg total) by mouth daily. 30 tablet 0   No current facility-administered medications on file prior to visit.    There are no Patient Instructions on file for this visit. Return in about 2 weeks (around 01/30/2024).   Shawntina Diffee E Livingston Denner, NP

## 2024-01-30 ENCOUNTER — Encounter (INDEPENDENT_AMBULATORY_CARE_PROVIDER_SITE_OTHER): Payer: Self-pay | Admitting: Nurse Practitioner

## 2024-01-30 ENCOUNTER — Ambulatory Visit (INDEPENDENT_AMBULATORY_CARE_PROVIDER_SITE_OTHER): Admitting: Nurse Practitioner

## 2024-01-30 VITALS — BP 123/83 | HR 116 | Resp 18 | Ht 63.0 in | Wt 130.2 lb

## 2024-01-30 DIAGNOSIS — T8189XD Other complications of procedures, not elsewhere classified, subsequent encounter: Secondary | ICD-10-CM

## 2024-01-30 NOTE — Progress Notes (Signed)
 Subjective:    Patient ID: Claudia Mills, female    DOB: [birth year], 53 y.o.   MRN: [id number] Chief Complaint  Patient presents with   Wound Check    Wound check follow up     HPI  Discussed the use of AI scribe software for clinical note transcription with the patient, who gave verbal consent to proceed.  History of Present Illness Claudia Mills is a 53 year old female who presents for follow-up of wound healing post op surgical wounds  She does not pick at the knee wound.  Regarding her foot, she feels sensations in her toes and notes that some dead skin is peeling off, occasionally finding it in her bed. She experiences muscle tensing in her leg, particularly when sitting, described as a 'jolt.' No pain is currently present in the foot. 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  She showers every other day or every two days, rinsing the leg without scrubbing. She uses a santal and gauze for wound care and keeps her foot dry, cleaning it with Betadine. She has a medical bag for her supplies but needs more gauze.  She enjoys visiting her niece's house daily for wound care, which provides a reprieve from her home environment.    Results     Review of Systems  Skin:  Positive for wound.  All other systems reviewed and are negative.      Objective:   Physical Exam Vitals reviewed.  HENT:     Head: Normocephalic.  Cardiovascular:     Rate and Rhythm: Normal rate.  Pulmonary:     Effort: Pulmonary effort is normal.  Skin:    General: Skin is dry.  Neurological:     Mental Status: She is alert and oriented to person, place, and time.  Psychiatric:        Mood and Affect: Mood normal.        Behavior: Behavior normal.        Thought Content: Thought content normal.        Judgment: Judgment normal.     Physical Exam EXTREMITIES: Wound on knee healing, smaller than before. Lower leg healing slower than expected. Groin wound healing well. Foot warm to touch. Toes with dry  gangrene, stable, no odor. Wound at bottom healing well, smaller, closing in.  BP 123/83 (BP Location: Left Arm, Patient Position: Sitting, Cuff Size: Normal)   Pulse (!) 116   Resp 18   Ht 5' 3 (1.6 m)   Wt 130 lb 3.2 oz (59.1 kg)   LMP 09/29/2015 Comment: neg preg test  BMI 23.06 kg/m   Past Medical History:  Diagnosis Date   Coronary artery disease    Diabetes mellitus without complication (HCC)    Hypertension    MI (myocardial infarction) (HCC)    PVD (peripheral vascular disease)    Tick bite    Pt states she does not remember daye of tic bite, but that is was since last visit with pain clinic and she was hospitalized for 1 day.    Social History   Socioeconomic History   Marital status: Widowed    Spouse name: Not on file   Number of children: Not on file   Years of education: Not on file   Highest education level: Not on file  Occupational History   Not on file  Tobacco Use   Smoking status: Never   Smokeless tobacco: Never  Vaping Use   Vaping status: Never Used  Substance and Sexual Activity   Alcohol use: Yes    Alcohol/week: 12.0 standard drinks of alcohol    Types: 12 Cans of beer per week   Drug use: Yes    Types: Marijuana    Comment: Patient states, I had some this morning.   Sexual activity: Not on file  Other Topics Concern   Not on file  Social History Narrative   Not on file   Social Drivers of Health   Tobacco Use: Low Risk (01/30/2024)   Patient History    Smoking Tobacco Use: Never    Smokeless Tobacco Use: Never    Passive Exposure: Not on file  Financial Resource Strain: Not on file  Food Insecurity: No Food Insecurity (10/19/2022)   Hunger Vital Sign    Worried About Running Out of Food in the Last Year: Never true    Ran Out of Food in the Last Year: Never true  Transportation Needs: No Transportation Needs (10/19/2022)   PRAPARE - Administrator, Civil Service (Medical): No    Lack of Transportation (Non-Medical):  No  Physical Activity: Not on file  Stress: Not on file  Social Connections: Not on file  Intimate Partner Violence: Not At Risk (10/19/2022)   Humiliation, Afraid, Rape, and Kick questionnaire    Fear of Current or Ex-Partner: No    Emotionally Abused: No    Physically Abused: No    Sexually Abused: No  Depression (PHQ2-9): Low Risk (09/12/2023)   Depression (PHQ2-9)    PHQ-2 Score: 2  Alcohol Screen: Not on file  Housing: Low Risk (10/19/2022)   Housing    Last Housing Risk Score: 0  Utilities: Not At Risk (10/19/2022)   AHC Utilities    Threatened with loss of utilities: No  Health Literacy: Not on file    Past Surgical History:  Procedure Laterality Date   APPLICATION OF CELL SAVER Left 11/09/2023   Procedure: APPLICATION OF CELL SAVER;  Surgeon: Jama Cordella MATSU, MD;  Location: [facility];  Service: Vascular;  Laterality: Left;   APPLICATION OF CELL SAVER N/A 11/16/2023   Procedure: APPLICATION OF CELL SAVER;  Surgeon: Jama Cordella MATSU, MD;  Location: [facility];  Service: Vascular;  Laterality: N/A;   BYPASS GRAFT POPLITEAL TO POPLITEAL  11/16/2023   Procedure: CREATION, BYPASS, ARTERIAL, POPLITEAL;  Surgeon: Jama Cordella MATSU, MD;  Location: [facility];  Service: Vascular;;   BYPASS GRAFT POPLITEAL TO TIBIAL  11/16/2023   Procedure: BYPASS GRAFT POPLITEAL TO TIBIAL (CATHLAB);  Surgeon: Jama Cordella MATSU, MD;  Location: [facility];  Service: Vascular;;   CORONARY ANGIOPLASTY WITH STENT PLACEMENT     ENDARTERECTOMY FEMORAL Left 11/09/2023   Procedure: ENDARTERECTOMY, FEMORAL;  Surgeon: Jama Cordella MATSU, MD;  Location: [facility];  Service: Vascular;  Laterality: Left;   ENDARTERECTOMY FEMORAL  11/16/2023   Procedure: ENDARTERECTOMY, FEMORAL;  Surgeon: Jama Cordella MATSU, MD;  Location: [facility];  Service: Vascular;;   ENDARTERECTOMY POPLITEAL  11/16/2023   Procedure: ENDARTERECTOMY, POPLITEAL;  Surgeon: Jama Cordella MATSU, MD;  Location: [facility];  Service: Vascular;;   ENDARTERECTOMY  TIBIOPERONEAL  11/16/2023   Procedure: ENDARTERECTOMY TIBIOPERONEAL (CATHLAB);  Surgeon: Jama Cordella MATSU, MD;  Location: [facility];  Service: Vascular;;   IR KYPHO LUMBAR INC FX REDUCE BONE BX UNI/BIL CANNULATION INC/IMAGING  10/06/2022   IR RADIOLOGIST EVAL & MGMT  09/20/2022   IR RADIOLOGIST EVAL & MGMT  10/13/2022   IR RADIOLOGIST EVAL & MGMT  10/20/2022   LOWER EXTREMITY  ANGIOGRAPHY Left 08/08/2023   Procedure: Lower Extremity Angiography;  Surgeon: Jama Cordella MATSU, MD;  Location[facility];  Service: Cardiovascular;  Laterality: Left;   LOWER EXTREMITY ANGIOGRAPHY Left 11/08/2023   Procedure: Lower Extremity Angiography;  Surgeon: Jama Cordella MATSU, MD;  Location: [facility] INVAS[facility] CV [facility];  Service: Cardiovascular;  Laterality: Left;   LOWER EXTREMITY ANGIOGRAPHY Left 11/15/2023   Procedure: Lower Extremity Angiography;  Surgeon: Jama Cordella MATSU, MD;  Location: [facility] INVAS[facility];  Service: Cardiovascular;  Laterality: Left;   LOWER EXTREMITY INTERVENTION Left 08/08/2023   Procedure: LOWER EXTREMITY INTERVENTION;  Surgeon: Jama Cordella MATSU, MD;  Location: [facility] INVASIVE CV LAB;  Service: Cardiovascular;  Laterality: Left;   THROMBECTOMY FEMORAL ARTERY Left 11/09/2023   Procedure: THROMBECTOMY, ARTERY, FEMORAL and Popliteal;  Surgeon: Jama Cordella MATSU, MD;  Location: [facility];  Service: Vascular;  Laterality: Left;    History reviewed. No pertinent family history.  Allergies[1]     Latest Ref Rng & Units 11/30/2023    4:56 PM 11/17/2023    4:18 AM 11/16/2023    4:44 AM  CBC  WBC 4.0 - 10.5 K/uL 11.2  12.9  8.1   Hemoglobin 12.0 - 15.0 g/dL 9.5  9.3  87.8   Hematocrit 36.0 - 46.0 % 30.7  27.8  36.5   Platelets 150 - 400 K/uL 545  182  234       CMP     Component Value Date/Time   NA 138 11/30/2023 1656   K 3.7 11/30/2023 1656   CL 103 11/30/2023 1656   CO2 21 (L) 11/30/2023 1656   GLUCOSE 153 (H) 11/30/2023 1656   BUN 6 11/30/2023 1656   CREATININE 0.72  11/30/2023 1656   CREATININE 0.58 09/20/2022 0930   CALCIUM  8.8 (L) 11/30/2023 1656   PROT 8.5 (H) 10/18/2022 1648   ALBUMIN 4.6 10/18/2022 1648   AST 17 10/18/2022 1648   ALT 12 10/18/2022 1648   ALKPHOS 126 10/18/2022 1648   BILITOT 1.4 (H) 10/18/2022 1648   EGFR 109 09/20/2022 0930   GFRNONAA >60 11/30/2023 1656     VAS US  ABI WITH/WO TBI Result Date: 12/18/2023  LOWER EXTREMITY DOPPLER STUDY Patient Name:  Mahrosh Donnell  Date of Exam:   12/14/2023 Medical Rec #: [id number]        Accession #:    [id number] Date of Birth: 01/25/1971        Patient Gender: F Patient Age:   55 years Exam Location:  [facility] Procedure:      VAS US  ABI WITH/WO TBI Referring Phys: [facility] --------------------------------------------------------------------------------  Indications: Rest pain, gangrene, and peripheral artery disease. High Risk Factors: Hypertension, Diabetes, no history of smoking, prior MI.  Vascular Interventions: 11/16/2023 Left pop to ATA bpg                          11/08/2023 Stent left EIA, SFA thrombectomy                          08/08/2023 Left SFA and EIA stent. Performing Technologist: Donnice Charnley RVT  Examination Guidelines: A complete evaluation includes at minimum, Doppler waveform signals and systolic blood pressure reading at the level of bilateral brachial, anterior tibial, and posterior tibial arteries, when vessel segments are accessible. Bilateral testing is considered an integral part of a complete examination. Photoelectric Plethysmograph (PPG) waveforms and toe systolic pressure readings  are included as required and additional duplex testing as needed. Limited examinations for reoccurring indications may be performed as noted.  ABI Findings: +---------+------------------+-----+----------+--------+ Right    Rt Pressure (mmHg)IndexWaveform  Comment  +---------+------------------+-----+----------+--------+ Brachial 161                                        +---------+------------------+-----+----------+--------+ PTA      73                0.45 monophasic         +---------+------------------+-----+----------+--------+ DP       60                0.37 monophasic         +---------+------------------+-----+----------+--------+ Great Toe0                 0.00 Absent             +---------+------------------+-----+----------+--------+ +---------+------------------+-----+----------+-------+ Left     Lt Pressure (mmHg)IndexWaveform  Comment +---------+------------------+-----+----------+-------+ Brachial 155                                      +---------+------------------+-----+----------+-------+ PTA      126               0.78 monophasic        +---------+------------------+-----+----------+-------+ DP       105               0.65 monophasic        +---------+------------------+-----+----------+-------+ Great Toe80                0.50                   +---------+------------------+-----+----------+-------+ +-------+-----------+-----------+------------+------------+ ABI/TBIToday's ABIToday's TBIPrevious ABIPrevious TBI +-------+-----------+-----------+------------+------------+ Right  0.45       0.00       0.54        0.37         +-------+-----------+-----------+------------+------------+ Left   0.78       0.50       0.44        0.00         +-------+-----------+-----------+------------+------------+ Right ABIs appear essentially unchanged compared to prior study on 08/30/2023. Left ABIs appear increased compared to prior study on 08/30/2023.  Summary: Right: Resting right ankle-brachial index indicates severe right lower extremity arterial disease. The right toe-brachial index is abnormal.  Left: Resting left ankle-brachial index indicates moderate left lower extremity arterial disease. The left toe-brachial index is abnormal.  *See table(s) above for measurements and observations.   Electronically signed by [name] MD on 12/18/2023 at 8:03:22 AM.    Final        Assessment & Plan:   1. 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 Thick scab on lower leg may detach, revealing open wound. Toes with stable dry gangrene. Right leg intervention deferred until groin wound heals. - Allow scab on lower leg to detach naturally. - Clean wound area with gauze and Betadine. - Keep foot dry and clean with Betadine. - Monitor toes for changes in pain, odor, or leakage. - Plan right leg intervention post groin  wound healing. -Return in 2 weeks    Assessment and Plan Assessment & Plan      Medications Ordered Prior to Encounter[2]  There are no Patient Instructions on file for this visit. Return in about 2 weeks (around 02/13/2024) for Wound Check FB.   [facility] Cartaya E Tanis Burnley, NP      [1]  Allergies Allergen Reactions   Bee Venom Anaphylaxis  [2]  Current Outpatient Medications on File Prior to Visit  Medication Sig Dispense Refill   aspirin  EC 81 MG tablet Take 1 tablet (81 mg total) by mouth daily. Swallow whole. 30 tablet 12   atorvastatin  (LIPITOR) 80 MG tablet Take 80 mg by mouth daily.     atorvastatin  (LIPITOR) 80 MG tablet Take 1 tablet (80 mg total) by mouth daily. 30 tablet 11   BRILINTA  90 MG TABS tablet Take 90 mg by mouth 2 (two) times daily.     clopidogrel  (PLAVIX ) 75 MG tablet Take 1 tablet (75 mg total) by mouth daily at 6 (six) AM. 30 tablet 11   cyclobenzaprine  (FLEXERIL ) 5 MG tablet Take 1 tablet (5 mg total) by mouth 3 (three) times daily as needed for muscle spasms. 60 tablet 0   doxycycline (VIBRAMYCIN) 100 MG capsule Take 100 mg by mouth 2 (two) times daily.     DULoxetine  (CYMBALTA ) 30 MG capsule Take 30 mg by mouth daily.     empagliflozin (JARDIANCE)  25 MG TABS tablet Take 25 mg by mouth daily.     gabapentin  (NEURONTIN ) 300 MG capsule Take 300 mg by mouth 3 (three) times daily.     glipiZIDE  (GLUCOTROL ) 5 MG tablet Take 1 tablet (5 mg total) by mouth daily before breakfast. 90 tablet 0   lisinopril  (ZESTRIL ) 20 MG tablet Take 1 tablet (20 mg total) by mouth daily. 30 tablet 1   nitroGLYCERIN  (NITROSTAT ) 0.3 MG SL tablet Place 0.3 mg under the tongue every 5 (five) minutes as needed for chest pain.     oxyCODONE  (OXY IR/ROXICODONE ) 5 MG immediate release tablet Take 1-2 tablets (5-10 mg total) by mouth every 6 (six) hours as needed for moderate pain (pain score 4-6). 30 tablet 0   pantoprazole  (PROTONIX ) 40 MG tablet Take 1 tablet (40 mg total) by mouth daily. 30 tablet 0   No current facility-administered medications on file prior to visit.

## 2024-02-12 ENCOUNTER — Telehealth (INDEPENDENT_AMBULATORY_CARE_PROVIDER_SITE_OTHER): Payer: Self-pay

## 2024-02-12 ENCOUNTER — Other Ambulatory Visit (INDEPENDENT_AMBULATORY_CARE_PROVIDER_SITE_OTHER): Payer: Self-pay | Admitting: Nurse Practitioner

## 2024-02-12 MED ORDER — SULFAMETHOXAZOLE-TRIMETHOPRIM 800-160 MG PO TABS: 1.0000 | ORAL_TABLET | Freq: Two times a day (BID) | ORAL | 0 refills | Status: AC

## 2024-02-12 NOTE — Telephone Encounter (Signed)
 Patient called in stated her wound on lower left leg is red and very angry with some swelling some warmth in the area pain in the area is at a 4/10 not draining no smell. Patient expressed concern of infection She reports the upper wound is also red again. Patient is unable to upload a picture or access MyChart. Please advise

## 2024-02-12 NOTE — Telephone Encounter (Signed)
 I sent in abx based on her last culture.  She has an appt with me Wednesday

## 2024-02-13 NOTE — Telephone Encounter (Signed)
 Call to patient advised she has an antibiotic that has been sent into [facility] on [address] in [place] she may pick up and she is to keep her appt on Wed. Patient in agreement no further questions at this time.

## 2024-02-14 ENCOUNTER — Other Ambulatory Visit (INDEPENDENT_AMBULATORY_CARE_PROVIDER_SITE_OTHER): Payer: Self-pay | Admitting: Nurse Practitioner

## 2024-02-14 ENCOUNTER — Ambulatory Visit (INDEPENDENT_AMBULATORY_CARE_PROVIDER_SITE_OTHER): Admitting: Nurse Practitioner

## 2024-02-14 ENCOUNTER — Encounter (INDEPENDENT_AMBULATORY_CARE_PROVIDER_SITE_OTHER): Payer: Self-pay | Admitting: Nurse Practitioner

## 2024-02-14 VITALS — BP 150/85 | HR 121 | Resp 18 | Ht 63.0 in | Wt 129.0 lb

## 2024-02-14 DIAGNOSIS — T8189XD Other complications of procedures, not elsewhere classified, subsequent encounter: Secondary | ICD-10-CM

## 2024-02-14 DIAGNOSIS — Z9889 Other specified postprocedural states: Secondary | ICD-10-CM

## 2024-02-14 DIAGNOSIS — I739 Peripheral vascular disease, unspecified: Secondary | ICD-10-CM

## 2024-02-18 ENCOUNTER — Encounter (INDEPENDENT_AMBULATORY_CARE_PROVIDER_SITE_OTHER): Payer: Self-pay | Admitting: Nurse Practitioner

## 2024-02-18 NOTE — Progress Notes (Signed)
 "  Subjective:    Patient ID: Claudia Mills, female    DOB: October 07, 1970, 54 y.o.   MRN: [id number] Chief Complaint  Patient presents with   Follow-up    2 week follow up wound check    HPI  Discussed the use of AI scribe software for clinical note transcription with the patient, who gave verbal consent to proceed.  History of Present Illness Claudia Mills is a 54 year old female with peripheral arterial disease status post lower extremity bypass who presents for evaluation of a non-healing surgical wound and dry gangrene of the toes.  She has a slow healing wound on her leg, attributed to friction, with recent detachment of the scab overnight or yesterday. Wound care is managed at home with saline and Betadine cleansing and Santyl ointment, most recently applied yesterday. The wound is kept clean and covered. She notes thick, scaly tissue at the wound site.  Previously, erythema was present around the wound and monitored for progression, but the redness has decreased over the past two days and has not spread. She denies leg pain but describes occasional involuntary movements in her toes. She started a new antibiotic yesterday, prescribed based on a prior wound culture, and will continue as directed.  She experiences pain in the toes with abnormal stepping, but there is no current plan for amputation.  There is a slowly decreasing wound in the groin. She expresses concern about maintaining blood flow in her leg following her prior bypass, with her last vascular evaluation in October. She notes her foot feels cold, while her leg remains warm, attributing the coldness to ambient temperature.    Results Diagnostic Lower extremity arterial ultrasound (11/2023): Bypass graft patent; no significant flow compromise  Wound culture collection Swab obtained from non-healing surgical wound with slough and surrounding erythema for microbiological culture.   Review of Systems  Skin:  Positive  for wound.  All other systems reviewed and are negative.      Objective:   Physical Exam Vitals reviewed.  HENT:     Head: Normocephalic.  Cardiovascular:     Rate and Rhythm: Normal rate.  Pulmonary:     Effort: Pulmonary effort is normal.  Skin:    General: Skin is warm and dry.  Neurological:     Mental Status: She is alert and oriented to person, place, and time.  Psychiatric:        Mood and Affect: Mood normal.        Behavior: Behavior normal.        Thought Content: Thought content normal.        Judgment: Judgment normal.     Physical Exam EXTREMITIES: Right foot cold, right leg warm. Right leg wound cultured, decreasing in size, with reduced redness.  BP (!) 150/85 (BP Location: Right Arm, Patient Position: Sitting, Cuff Size: Normal)   Pulse (!) 121   Resp 18   Ht 5' 3 (1.6 m)   Wt 129 lb (58.5 kg)   LMP 09/29/2015 Comment: neg preg test  BMI 22.85 kg/m   Past Medical History:  Diagnosis Date   Coronary artery disease    Diabetes mellitus without complication (HCC)    Hypertension    MI (myocardial infarction) (HCC)    PVD (peripheral vascular disease)    Tick bite    Pt states she does not remember daye of tic bite, but that is was since last visit with pain clinic and she was hospitalized for 1 day.  Social History   Socioeconomic History   Marital status: Widowed    Spouse name: Not on file   Number of children: Not on file   Years of education: Not on file   Highest education level: Not on file  Occupational History   Not on file  Tobacco Use   Smoking status: Never   Smokeless tobacco: Never  Vaping Use   Vaping status: Never Used  Substance and Sexual Activity   Alcohol use: Yes    Alcohol/week: 12.0 standard drinks of alcohol    Types: 12 Cans of beer per week   Drug use: Yes    Types: Marijuana    Comment: Patient states, I had some this morning.   Sexual activity: Not on file  Other Topics Concern   Not on file  Social  History Narrative   Not on file   Social Drivers of Health   Tobacco Use: Low Risk (02/18/2024)   Patient History    Smoking Tobacco Use: Never    Smokeless Tobacco Use: Never    Passive Exposure: Not on file  Financial Resource Strain: Not on file  Food Insecurity: No Food Insecurity (10/19/2022)   Hunger Vital Sign    Worried About Running Out of Food in the Last Year: Never true    Ran Out of Food in the Last Year: Never true  Transportation Needs: No Transportation Needs (10/19/2022)   PRAPARE - Administrator, Civil Service (Medical): No    Lack of Transportation (Non-Medical): No  Physical Activity: Not on file  Stress: Not on file  Social Connections: Not on file  Intimate Partner Violence: Not At Risk (10/19/2022)   Humiliation, Afraid, Rape, and Kick questionnaire    Fear of Current or Ex-Partner: No    Emotionally Abused: No    Physically Abused: No    Sexually Abused: No  Depression (PHQ2-9): Low Risk (09/12/2023)   Depression (PHQ2-9)    PHQ-2 Score: 2  Alcohol Screen: Not on file  Housing: Low Risk (10/19/2022)   Housing    Last Housing Risk Score: 0  Utilities: Not At Risk (10/19/2022)   [facility] Utilities    Threatened with loss of utilities: No  Health Literacy: Not on file    Past Surgical History:  Procedure Laterality Date   APPLICATION OF CELL SAVER Left 11/09/2023   Procedure: APPLICATION OF CELL SAVER;  Surgeon: Jama Cordella MATSU, MD;  Location: [facility];  Service: Vascular;  Laterality: Left;   APPLICATION OF CELL SAVER N/A 11/16/2023   Procedure: APPLICATION OF CELL SAVER;  Surgeon: Jama Cordella MATSU, MD;  Location: [facility];  Service: Vascular;  Laterality: N/A;   BYPASS GRAFT POPLITEAL TO POPLITEAL  11/16/2023   Procedure: CREATION, BYPASS, ARTERIAL, POPLITEAL;  Surgeon: Jama Cordella MATSU, MD;  Location: [facility];  Service: Vascular;;   BYPASS GRAFT POPLITEAL TO TIBIAL  11/16/2023   Procedure: BYPASS GRAFT POPLITEAL TO TIBIAL (CATHLAB);  Surgeon:  Jama Cordella MATSU, MD;  Location: [facility];  Service: Vascular;;   CORONARY ANGIOPLASTY WITH STENT PLACEMENT     ENDARTERECTOMY FEMORAL Left 11/09/2023   Procedure: ENDARTERECTOMY, FEMORAL;  Surgeon: Jama Cordella MATSU, MD;  Location: [facility];  Service: Vascular;  Laterality: Left;   ENDARTERECTOMY FEMORAL  11/16/2023   Procedure: ENDARTERECTOMY, FEMORAL;  Surgeon: Jama Cordella MATSU, MD;  Location: [facility];  Service: Vascular;;   ENDARTERECTOMY POPLITEAL  11/16/2023   Procedure: ENDARTERECTOMY, POPLITEAL;  Surgeon: Jama Cordella MATSU, MD;  Location: [facility];  Service: Vascular;;   ENDARTERECTOMY TIBIOPERONEAL  11/16/2023   Procedure: ENDARTERECTOMY TIBIOPERONEAL (CATHLAB);  Surgeon: Jama Cordella MATSU, MD;  Location: [facility];  Service: Vascular;;   IR KYPHO LUMBAR INC FX REDUCE BONE BX UNI/BIL CANNULATION INC/IMAGING  10/06/2022   IR RADIOLOGIST EVAL & MGMT  09/20/2022   IR RADIOLOGIST EVAL & MGMT  10/13/2022   IR RADIOLOGIST EVAL & MGMT  10/20/2022   LOWER EXTREMITY ANGIOGRAPHY Left 08/08/2023   Procedure: Lower Extremity Angiography;  Surgeon: Jama Cordella MATSU, MD;  Location: [facility];  Service: Cardiovascular;  Laterality: Left;   LOWER EXTREMITY ANGIOGRAPHY Left 11/08/2023   Procedure: Lower Extremity Angiography;  Surgeon: Jama Cordella MATSU, MD;  Location: [facility];  Service: Cardiovascular;  Laterality: Left;   LOWER EXTREMITY ANGIOGRAPHY Left 11/15/2023   Procedure: Lower Extremity Angiography;  Surgeon: Jama Cordella MATSU, MD;  Location: [facility];  Service: Cardiovascular;  Laterality: Left;   LOWER EXTREMITY INTERVENTION Left 08/08/2023   Procedure: LOWER EXTREMITY INTERVENTION;  Surgeon: Jama Cordella MATSU, MD;  Location: [facility];  Service: Cardiovascular;  Laterality: Left;   THROMBECTOMY FEMORAL ARTERY Left 11/09/2023   Procedure: THROMBECTOMY, ARTERY, FEMORAL and Popliteal;  Surgeon: Jama Cordella MATSU, MD;  Location: [facility];  Service:  Vascular;  Laterality: Left;    History reviewed. No pertinent family history.  Allergies[1]     Latest Ref Rng & Units 11/30/2023    4:56 PM 11/17/2023    4:18 AM 11/16/2023    4:44 AM  CBC  WBC 4.0 - 10.5 K/uL 11.2  12.9  8.1   Hemoglobin 12.0 - 15.0 g/dL 9.5  9.3  87.8   Hematocrit 36.0 - 46.0 % 30.7  27.8  36.5   Platelets 150 - 400 K/uL 545  182  234       CMP     Component Value Date/Time   NA 138 11/30/2023 1656   K 3.7 11/30/2023 1656   CL 103 11/30/2023 1656   CO2 21 (L) 11/30/2023 1656   GLUCOSE 153 (H) 11/30/2023 1656   BUN 6 11/30/2023 1656   CREATININE 0.72 11/30/2023 1656   CREATININE 0.58 09/20/2022 0930   CALCIUM  8.8 (L) 11/30/2023 1656   PROT 8.5 (H) 10/18/2022 1648   ALBUMIN 4.6 10/18/2022 1648   AST 17 10/18/2022 1648   ALT 12 10/18/2022 1648   ALKPHOS 126 10/18/2022 1648   BILITOT 1.4 (H) 10/18/2022 1648   EGFR 109 09/20/2022 0930   GFRNONAA >60 11/30/2023 1656     No results found.     Assessment & Plan:   1. Non-healing surgical wound, subsequent encounter (Primary) Non-healing surgical wound Chronic non-healing wound with recent scab detachment, showing improvement and decreased erythema. No spreading infection, but risk remains due to chronicity and slough. - Continued wound care with saline and Betadine cleansing, and Santyl ointment application. - Provided additional Santyl ointment and occlusive gauze for home use. - Ordered wound culture to evaluate for infection. - Follow-up in two weeks for reassessment.  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 Amputation necessary but deferred until wound healing and vascular optimization. - Monitor for changes, pain, or signs of infection in gangrenous toes. - Delay amputation until surgical wound is healed and blood flow is optimized. - Discussed future need for amputation, but not immediately indicated.    2. Peripheral arterial disease with  history of revascularization  Peripheral arterial disease of lower extremity with  prior bypass Peripheral arterial disease with prior bypass; bypass patent with warm leg and cold foot, likely due to small vessel disease and cold weather. Concern for vascular insufficiency. - Order ankle-brachial indices at next follow-up to assess perfusion. - Monitor for symptoms of worsening ischemia or bypass failure. - Plan to address vascular insufficiency prior to amputation of gangrenous toes.  Assessment and Plan Assessment & Plan        Medications Ordered Prior to Encounter[2]  There are no Patient Instructions on file for this visit. No follow-ups on file.   Trayce Caravello E Avory Rahimi, NP      [1]  Allergies Allergen Reactions   Bee Venom Anaphylaxis  [2]  Current Outpatient Medications on File Prior to Visit  Medication Sig Dispense Refill   aspirin  EC 81 MG tablet Take 1 tablet (81 mg total) by mouth daily. Swallow whole. 30 tablet 12   atorvastatin  (LIPITOR) 80 MG tablet Take 80 mg by mouth daily.     atorvastatin  (LIPITOR) 80 MG tablet Take 1 tablet (80 mg total) by mouth daily. 30 tablet 11   BRILINTA  90 MG TABS tablet Take 90 mg by mouth 2 (two) times daily.     clopidogrel  (PLAVIX ) 75 MG tablet Take 1 tablet (75 mg total) by mouth daily at 6 (six) AM. 30 tablet 11   cyclobenzaprine  (FLEXERIL ) 5 MG tablet Take 1 tablet (5 mg total) by mouth 3 (three) times daily as needed for muscle spasms. 60 tablet 0   DULoxetine  (CYMBALTA ) 30 MG capsule Take 30 mg by mouth daily.     empagliflozin (JARDIANCE) 25 MG TABS tablet Take 25 mg by mouth daily.     gabapentin  (NEURONTIN ) 300 MG capsule Take 300 mg by mouth 3 (three) times daily.     lisinopril  (ZESTRIL ) 20 MG tablet Take 1 tablet (20 mg total) by mouth daily. 30 tablet 1   lisinopril  (ZESTRIL ) 5 MG tablet Take 5 mg by mouth daily.     nitroGLYCERIN  (NITROSTAT ) 0.3 MG SL tablet Place 0.3 mg under the tongue every 5 (five) minutes as needed  for chest pain.     oxyCODONE  (OXY IR/ROXICODONE ) 5 MG immediate release tablet Take 1-2 tablets (5-10 mg total) by mouth every 6 (six) hours as needed for moderate pain (pain score 4-6). 30 tablet 0   pantoprazole  (PROTONIX ) 40 MG tablet Take 1 tablet (40 mg total) by mouth daily. 30 tablet 0   sulfamethoxazole -trimethoprim  (BACTRIM  DS) 800-160 MG tablet Take 1 tablet by mouth 2 (two) times daily. 20 tablet 0   glipiZIDE  (GLUCOTROL ) 5 MG tablet Take 1 tablet (5 mg total) by mouth daily before breakfast. 90 tablet 0   No current facility-administered medications on file prior to visit.   "

## 2024-02-19 LAB — AEROBIC CULTURE

## 2024-02-21 ENCOUNTER — Other Ambulatory Visit (INDEPENDENT_AMBULATORY_CARE_PROVIDER_SITE_OTHER): Payer: Self-pay | Admitting: Vascular Surgery

## 2024-02-21 DIAGNOSIS — I739 Peripheral vascular disease, unspecified: Secondary | ICD-10-CM

## 2024-02-22 ENCOUNTER — Telehealth (INDEPENDENT_AMBULATORY_CARE_PROVIDER_SITE_OTHER): Payer: Self-pay

## 2024-02-22 NOTE — Telephone Encounter (Signed)
 Patient called into nurse line to report that she is out of packing supplies and wont be back in the office until next week has enough for 4 more dressing changes. She reports that the dressing was yellow. Per NP and notes no packing needed. Patient to check with her niece to be sure of what was given to her as she has the paper for the dressing and call us  back.

## 2024-02-23 NOTE — Telephone Encounter (Signed)
 Patient stopped into office with a xeroform packaging she reports was laid on top of her wound after santal had been applied. Large xeroform given to patient advised her she may cut it in strips, she is to return on Wed.

## 2024-02-27 ENCOUNTER — Ambulatory Visit
Attending: Student in an Organized Health Care Education/Training Program | Admitting: Student in an Organized Health Care Education/Training Program

## 2024-02-27 ENCOUNTER — Encounter: Payer: Self-pay | Admitting: Student in an Organized Health Care Education/Training Program

## 2024-02-27 VITALS — BP 127/77 | HR 108 | Temp 98.0°F | Resp 16 | Ht 63.0 in | Wt 130.0 lb

## 2024-02-27 DIAGNOSIS — G894 Chronic pain syndrome: Secondary | ICD-10-CM | POA: Insufficient documentation

## 2024-02-27 DIAGNOSIS — M545 Low back pain, unspecified: Secondary | ICD-10-CM | POA: Insufficient documentation

## 2024-02-27 DIAGNOSIS — G8929 Other chronic pain: Secondary | ICD-10-CM | POA: Diagnosis present

## 2024-02-27 DIAGNOSIS — M47816 Spondylosis without myelopathy or radiculopathy, lumbar region: Secondary | ICD-10-CM | POA: Diagnosis present

## 2024-02-27 NOTE — Patient Instructions (Signed)
 ______________________________________________________________________    Preparing for your procedure  Appointments: If you think you may not be able to keep your appointment, call 24-48 hours in advance to cancel. We need time to make it available to others.  Procedure visits are for procedures only. During your procedure appointment there will be: NO Prescription Refills*. NO medication changes or discussions*. NO discussion of disability issues*. NO unrelated pain problem evaluations*. NO evaluations to order other pain procedures*. *These will be addressed at a separate and distinct evaluation encounter on the provider's evaluation schedule and not during procedure days.  Instructions: Food intake: Avoid eating anything solid for at least 8 hours prior to your procedure. Clear liquid intake: You may take clear liquids such as water up to 2 hours prior to your procedure. (No carbonated drinks. No soda.) Transportation: Unless otherwise stated by your physician, bring a driver. (Driver cannot be a Market researcher, Pharmacist, community, or any other form of public transportation.) Morning Medicines: Except for blood thinners, take all of your other morning medications with a sip of water. Make sure to take your heart and blood pressure medicines. If your blood pressure's lower number is above 100, the case will be rescheduled. Blood thinners: Make sure to stop your blood thinners as instructed.  If you take a blood thinner, but were not instructed to stop it, call our office [phone] and ask to talk to a nurse. Not stopping a blood thinner prior to certain procedures could lead to serious complications. Diabetics on insulin: Notify the staff so that you can be scheduled 1st case in the morning. If your diabetes requires high dose insulin, take only  of your normal insulin dose the morning of the procedure and notify the staff that you have done so. Preventing infections: Shower with an antibacterial soap the  morning of your procedure.  Build-up your immune system: Take 1000 mg of Vitamin C with every meal (3 times a day) the day prior to your procedure. Antibiotics: Inform the nursing staff if you are taking any antibiotics or if you have any conditions that may require antibiotics prior to procedures. (Example: recent joint implants)   Pregnancy: If you are pregnant make sure to notify the nursing staff. Not doing so may result in injury to the fetus, including death.  Sickness: If you have a cold, fever, or any active infections, call and cancel or reschedule your procedure. Receiving steroids while having an infection may result in complications. Arrival: You must be in the facility at least 30 minutes prior to your scheduled procedure. Tardiness: Your scheduled time is also the cutoff time. If you do not arrive at least 15 minutes prior to your procedure, you will be rescheduled.  Children: Do not bring any children with you. Make arrangements to keep them home. Dress appropriately: There is always a possibility that your clothing may get soiled. Avoid long dresses. Valuables: Do not bring any jewelry or valuables.  Reasons to call and reschedule or cancel your procedure: (Following these recommendations will minimize the risk of a serious complication.) Surgeries: Avoid having procedures within 2 weeks of any surgery. (Avoid for 2 weeks before or after any surgery). Flu Shots: Avoid having procedures within 2 weeks of a flu shots or . (Avoid for 2 weeks before or after immunizations). Barium: Avoid having a procedure within 7-10 days after having had a radiological study involving the use of radiological contrast. (Myelograms, Barium swallow or enema study). Heart attacks: Avoid any elective procedures or surgeries for the  initial 6 months after a "Myocardial Infarction" (Heart Attack). Blood thinners: It is imperative that you stop these medications before procedures. Let us know if you if you take  any blood thinner.  Infection: Avoid procedures during or within two weeks of an infection (including chest colds or gastrointestinal problems). Symptoms associated with infections include: Localized redness, fever, chills, night sweats or profuse sweating, burning sensation when voiding, cough, congestion, stuffiness, runny nose, sore throat, diarrhea, nausea, vomiting, cold or Flu symptoms, recent or current infections. It is specially important if the infection is over the area that we intend to treat. Heart and lung problems: Symptoms that may suggest an active cardiopulmonary problem include: cough, chest pain, breathing difficulties or shortness of breath, dizziness, ankle swelling, uncontrolled high or unusually low blood pressure, and/or palpitations. If you are experiencing any of these symptoms, cancel your procedure and contact your primary care physician for an evaluation.  Remember:  Regular Business hours are:  Monday to Thursday 8:00 AM to 4:00 PM  Provider's Schedule: Delano Metz, MD:  Procedure days: Tuesday and Thursday 7:30 AM to 4:00 PM  Edward Jolly, MD:  Procedure days: Monday and Wednesday 7:30 AM to 4:00 PM Last  Updated: 01/24/2023 ______________________________________________________________________    Radiofrequency Ablation Radiofrequency ablation is a procedure that is performed to relieve pain. The procedure is often used for back, neck, or arm pain. Radiofrequency ablation involves the use of a machine that creates radio waves to make heat. During the procedure, the heat is applied to the nerve that carries the pain signal. The heat damages the nerve and interferes with the pain signal. Pain relief usually starts about 2 weeks after the procedure and lasts for 6 months to 1 year. Tell a health care provider about: Any allergies you have. All medicines you are taking, including vitamins, herbs, eye drops, creams, and over-the-counter medicines. Any problems  you or family members have had with anesthetic medicines. Any bleeding problems you have. Any surgeries you have had. Any medical conditions you have. Whether you are pregnant or may be pregnant. What are the risks? Generally, this is a safe procedure. However, problems may occur, including: Pain or soreness at the injection site. Allergic reaction to medicines given during the procedure. Bleeding. Infection at the injection site. Damage to nerves or blood vessels. What happens before the procedure? When to stop eating and drinking Follow instructions from your health care provider about what you may eat and drink before your procedure. These may include: 8 hours before the procedure Stop eating most foods. Do not eat meat, fried foods, or fatty foods. Eat only light foods, such as toast or crackers. All liquids are okay except energy drinks and alcohol. 6 hours before the procedure Stop eating. Drink only clear liquids, such as water, clear fruit juice, black coffee, plain tea, and sports drinks. Do not drink energy drinks or alcohol. 2 hours before the procedure Stop drinking all liquids. You may be allowed to take medicine with small sips of water. If you do not follow your health care provider's instructions, your procedure may be delayed or canceled. Medicines Ask your health care provider about: Changing or stopping your regular medicines. This is especially important if you are taking diabetes medicines or blood thinners. Taking medicines such as aspirin and ibuprofen. These medicines can thin your blood. Do not take these medicines unless your health care provider tells you to take them. Taking over-the-counter medicines, vitamins, herbs, and supplements. General instructions Ask your health care  provider what steps will be taken to help prevent infection. These steps may include: Removing hair at the procedure site. Washing skin with a germ-killing soap. Taking antibiotic  medicine. If you will be going home right after the procedure, plan to have a responsible adult: Take you home from the hospital or clinic. You will not be allowed to drive. Care for you for the time you are told. What happens during the procedure?  You will be awake during the procedure. You will need to be able to talk with the health care provider during the procedure. An IV will be inserted into one of your veins. You will be given one or more of the following: A medicine to help you relax (sedative). A medicine to numb the area (local anesthetic). Your health care provider will insert a radiofrequency needle into the area to be treated. This is done with the help of fluoroscopy. A wire that carries the radio waves (electrode) will be put through the radiofrequency needle. An electrical pulse will be sent through the electrode to verify the correct nerve that is causing your pain. You will feel a tingling sensation, and you may have muscle twitching. The tissue around the needle tip will be heated by an electric current that comes from the radiofrequency machine. This will numb the nerves. The needle will be removed. A bandage (dressing) will be put on the insertion area. The procedure may vary among health care providers and hospitals. What happens after the procedure? Your blood pressure, heart rate, breathing rate, and blood oxygen level will be monitored until you leave the hospital or clinic. Return to your normal activities as told by your health care provider. Ask your health care provider what activities are safe for you. If you were given a sedative during the procedure, it can affect you for several hours. Do not drive or operate machinery until your health care provider says that it is safe. Summary Radiofrequency ablation is a procedure that is performed to relieve pain. The procedure is often used for back, neck, or arm pain. Radiofrequency ablation involves the use of a  machine that creates radio waves to make heat. Plan to have a responsible adult take you home from the hospital or clinic. Do not drive or operate machinery until your health care provider says that it is safe. Return to your normal activities as told by your health care provider. Ask your health care provider what activities are safe for you. This information is not intended to replace advice given to you by your health care provider. Make sure you discuss any questions you have with your health care provider. Document Revised: 07/21/2020 Document Reviewed: 07/21/2020 Elsevier Patient Education  2024 [employer].

## 2024-02-27 NOTE — Progress Notes (Signed)
 Safety precautions to be maintained throughout the outpatient stay will include: orient to surroundings, keep bed in low position, maintain call bell within reach at all times, provide assistance with transfer out of bed and ambulation.   Pt currently seeing FBrown form left side toes. Great toe and one eside it.

## 2024-02-27 NOTE — Progress Notes (Signed)
 PROVIDER NOTE: Interpretation of information contained herein should be left to medically-trained personnel. Specific patient instructions are provided elsewhere under Patient Instructions section of medical record. This document was created in part using AI and STT-dictation technology, any transcriptional errors that may result from this process are unintentional.  Patient: Claudia Mills  Service: E/M   PCP: Lorel Maxie LABOR, MD  DOB: 02-28-1970  DOS: 02/27/2024  Provider: Wallie Sherry, MD  MRN: [id number]  Delivery: Face-to-face  Specialty: Interventional Pain Management  Type: Established Patient  Setting: Ambulatory outpatient facility  Specialty designation: 09  Referring Prov.: Lorel Maxie LABOR, MD  Location: Outpatient office facility       History of present illness (HPI) Claudia Mills, a 54 y.o. year old female, is here today because of her Lumbar spondylosis [M47.816]. Claudia Mills primary complain today is Back Pain  Pertinent problems: Claudia Mills has Lumbar facet arthropathy and Chronic bilateral low back pain without sciatica on their pertinent problem list.  Pain Assessment: Severity of Chronic pain is reported as a 5 /10. Location: Back Left, Right, Lower/denies. Onset: More than a month ago. Quality: Aching. Timing: Constant. Modifying factor(s): leaning forward, rest. Vitals:  height is 5' 3 (1.6 m) and weight is 130 lb (59 kg). Her temperature is 98 F (36.7 C). Her blood pressure is 127/77 and her pulse is 108 (abnormal). Her respiration is 16 and oxygen saturation is 100%.  BMI: Estimated body mass index is 23.03 kg/m as calculated from the following:   Height as of this encounter: 5' 3 (1.6 m).   Weight as of this encounter: 130 lb (59 kg).  Last encounter: 05/31/2023. Last procedure: 07/19/2023.  Reason for encounter:  History of Present Illness   Anaira Claudia Mills is a 54 year old female who presents with post-surgical complications following leg bypass  surgery.  She underwent leg bypass surgery on September 24th or 25th, which involved 95 staples. The surgical site is infected in a couple of places, with one area where the scab came off prematurely. These infections are improving, but she is concerned about her toes turning green and is unsure when they will be addressed.  She experiences a flare-up of back pain, which she attributes to physical therapy. She previously underwent a nerve ablation procedure in June, which provided relief.  She is prescribed blood thinners, Berlanta and baby aspirin , but is not currently taking Berlanta because it made her sick.  She inquires about increasing the dosage of her pain medication, oxycodone , but is unsure who prescribes it.       Given the patient's return her axial low back pain we recommend repeating lumbar radiofrequency ablation.  She had her previous bilateral L3, L4, L5 RFA 07/19/2023 that provided her with 80% pain relief for over 6 months.  Given return of low back pain that is impacting her functional status we discussed repeating.  ROS  Constitutional: Denies any fever or chills Gastrointestinal: No reported hemesis, hematochezia, vomiting, or acute GI distress Musculoskeletal: axial low back pain Neurological: No reported episodes of acute onset apraxia, aphasia, dysarthria, agnosia, amnesia, paralysis, loss of coordination, or loss of consciousness  Medication Review  DULoxetine , aspirin  EC, atorvastatin , clopidogrel , cyclobenzaprine , empagliflozin, gabapentin , glipiZIDE , lisinopril , nitroGLYCERIN , oxyCODONE , pantoprazole , sulfamethoxazole -trimethoprim , and ticagrelor   History Review  Allergy: Claudia Mills is allergic to bee venom. Drug: Claudia Mills  reports current drug use. Drug: Marijuana. Alcohol:  reports current alcohol use of about 12.0 standard drinks of alcohol per week. Tobacco:  reports that she has never smoked. She has never used smokeless tobacco. Social: Claudia Mills  reports  that she has never smoked. She has never used smokeless tobacco. She reports current alcohol use of about 12.0 standard drinks of alcohol per week. She reports current drug use. Drug: Marijuana. Medical:  has a past medical history of Coronary artery disease, Diabetes mellitus without complication (HCC), Hypertension, MI (myocardial infarction) (HCC), PVD (peripheral vascular disease), and Tick bite. Surgical: Claudia Mills  has a past surgical history that includes Coronary angioplasty with stent; IR Radiologist Eval & Mgmt (09/20/2022); IR KYPHO LUMBAR INC FX REDUCE BONE BX UNI/BIL CANNULATION INC/IMAGING (10/06/2022); IR Radiologist Eval & Mgmt (10/13/2022); IR Radiologist Eval & Mgmt (10/20/2022); LOWER EXTREMITY INTERVENTION (Left, 08/08/2023); Lower Extremity Angiography (Left, 08/08/2023); Lower Extremity Angiography (Left, 11/08/2023); Lower Extremity Angiography (Left, 11/15/2023); Endarterectomy femoral (Left, 11/09/2023); Application of cell saver (Left, 11/09/2023); Thrombectomy femoral artery (Left, 11/09/2023); Application of cell saver (N/A, 11/16/2023); Bypass graft popliteal to popliteal (11/16/2023); Endarterectomy femoral (11/16/2023); Endarterectomy popliteal (11/16/2023); ENDARTERECTOMY TIBIOPERONEAL (11/16/2023); and BYPASS GRAFT POPLITEAL TO TIBIAL (11/16/2023). Family: family history is not on file.  Laboratory Chemistry Profile   Renal Lab Results  Component Value Date   BUN 6 11/30/2023   CREATININE 0.72 11/30/2023   BCR 10 09/20/2022   GFRAA >60 12/30/2015   GFRNONAA >60 11/30/2023    Hepatic Lab Results  Component Value Date   AST 17 10/18/2022   ALT 12 10/18/2022   ALBUMIN 4.6 10/18/2022   ALKPHOS 126 10/18/2022   LIPASE 55 (H) 10/18/2022    Electrolytes Lab Results  Component Value Date   NA 138 11/30/2023   K 3.7 11/30/2023   CL 103 11/30/2023   CALCIUM  8.8 (L) 11/30/2023   MG 1.6 (L) 10/19/2022   PHOS 2.8 10/19/2022    Bone No results found for: VD25OH, CI874NY7UNU,  CI6874NY7, CI7874NY7, 25OHVITD1, 25OHVITD2, 25OHVITD3, TESTOFREE, TESTOSTERONE  Inflammation (CRP: Acute Phase) (ESR: Chronic Phase) Lab Results  Component Value Date   LATICACIDVEN 1.2 11/30/2023         Note: Above Lab results reviewed.  Recent Imaging Review  VAS US  ABI WITH/WO TBI  LOWER EXTREMITY DOPPLER STUDY  Patient Name:  Jashay Roddy  Date of Exam:   12/14/2023 Medical Rec #: [id number]        Accession #:    [id number] Date of Birth: Sep 11, 1970        Patient Gender: F Patient Age:   10 years Exam Location:  [facility] Procedure:      VAS US  ABI WITH/WO TBI Referring Phys: [facility]  --------------------------------------------------------------------------------   Indications: Rest pain, gangrene, and peripheral artery disease.  High Risk Factors: Hypertension, Diabetes, no history of smoking, prior MI.   Vascular Interventions: 11/16/2023 Left pop to ATA bpg                           11/08/2023 Stent left EIA, SFA thrombectomy                           08/08/2023 Left SFA and EIA stent.  Performing Technologist: Donnice Charnley RVT    Examination Guidelines: A complete evaluation includes at minimum, Doppler waveform signals and systolic blood pressure reading at the level of bilateral brachial, anterior tibial, and posterior tibial arteries, when vessel segments are accessible. Bilateral testing is considered an integral part of a complete examination. Photoelectric Plethysmograph (  PPG) waveforms and toe systolic pressure readings are included as required and additional duplex testing as needed. Limited examinations for reoccurring indications may be performed as noted.    ABI Findings: +---------+------------------+-----+----------+--------+ Right    Rt Pressure (mmHg)IndexWaveform  Comment  +---------+------------------+-----+----------+--------+ Brachial 161                                        +---------+------------------+-----+----------+--------+ PTA      73                0.45 monophasic         +---------+------------------+-----+----------+--------+ DP       60                0.37 monophasic         +---------+------------------+-----+----------+--------+ Great Toe0                 0.00 Absent             +---------+------------------+-----+----------+--------+  +---------+------------------+-----+----------+-------+ Left     Lt Pressure (mmHg)IndexWaveform  Comment +---------+------------------+-----+----------+-------+ Brachial 155                                      +---------+------------------+-----+----------+-------+ PTA      126               0.78 monophasic        +---------+------------------+-----+----------+-------+ DP       105               0.65 monophasic        +---------+------------------+-----+----------+-------+ Great Toe80                0.50                   +---------+------------------+-----+----------+-------+  +-------+-----------+-----------+------------+------------+ ABI/TBIToday's ABIToday's TBIPrevious ABIPrevious TBI +-------+-----------+-----------+------------+------------+ Right  0.45       0.00       0.54        0.37         +-------+-----------+-----------+------------+------------+ Left   0.78       0.50       0.44        0.00         +-------+-----------+-----------+------------+------------+  Right ABIs appear essentially unchanged compared to prior study on 08/30/2023. Left ABIs appear increased compared to prior study on 08/30/2023.   Summary: Right: Resting right ankle-brachial index indicates severe right lower extremity arterial disease. The right toe-brachial index is abnormal.   Left: Resting left ankle-brachial index indicates moderate left lower extremity arterial disease. The left toe-brachial index is abnormal.    *See table(s) above for  measurements and observations.    Electronically signed by [name] MD on 12/18/2023 at 8:03:22 AM.      Final    L1-L2: No significant disc herniation or stenosis.   L2-L3: Mild bony retropulsion at the level of the L2 inferior endplate, slightly progressed. Progressive disc bulge. Facet arthrosis and ligamentum flavum hypertrophy. Right greater than left subarticular narrowing. Mild narrowing of the central canal. No significant foraminal stenosis.   L3-L4: Disc bulge. Superimposed broad-based disc protrusion spanning the central and bilateral subarticular zones. Facet arthrosis and ligamentum flavum hypertrophy. Bilateral subarticular narrowing. Moderate central canal stenosis. No significant foraminal stenosis. As before, there is a large  anterior disc extrusion near midline which contacts the aorta.   L4-L5: Disc bulge with endplate osteophytes. Superimposed moderately large central disc extrusion with mild caudal migration. Facet arthrosis and ligamentum flavum hypertrophy. Severe bilateral subarticular and central canal stenosis. Bilateral neural foraminal narrowing (moderate/severe right, mild left).   L5-S1: Disc bulge with endplate spurring. Superimposed moderately enlarged central/left subarticular disc extrusion with mild cranial migration. Facet arthrosis and ligamentum flavum hypertrophy. The disc extrusion results in severe left subarticular stenosis and likely encroaches upon the descending left S1 nerve root. Mild right subarticular stenosis. Moderate/severe bilateral neural foraminal narrowing.   IMPRESSION: 1. L1 compression fracture (30-40% height loss) with vertically-oriented fracture through the vertebral body, new from the prior lumbar spine CT of 04/07/2021 and acute in appearance. 2. L2 inferior endplate vertebral compression fracture with progressive height loss as compared to the prior lumbar spine CT (now 40-50%). This is age-indeterminate  but favored chronic given the degree of sclerosis at this site. 3. Apart from mild progression of a disc bulge at L2-L3, lumbar spondylosis is unchanged from the prior exam. Findings are most notably as follows. 4. At L3-L4, there is multifactorial bilateral subarticular narrowing and moderate central canal stenosis. 5. At L4-L5, there is multifactorial severe bilateral subarticular and central canal stenosis. Moderate/severe right neural foraminal and also present at this level. 6. At L5-S1, there is multifactorial severe left subarticular stenosis. Moderate/severe bilateral neural foraminal narrowing also present at this level. 7. Bilateral nonobstructive nephrolithiasis. 8. Distended urinary bladder, incompletely imaged. 9.  Aortic Atherosclerosis (ICD10-I70.0).   Electronically Signed By: Rockey Childs D.O. On: 06/28/2022 18:24 Note: Reviewed       Note: Reviewed        Note: Reviewed        Physical Exam  Vitals: BP 127/77   Pulse (!) 108   Temp 98 F (36.7 C)   Resp 16   Ht 5' 3 (1.6 m)   Wt 130 lb (59 kg)   LMP 09/29/2015 Comment: neg preg test  SpO2 100%   BMI 23.03 kg/m  BMI: Estimated body mass index is 23.03 kg/m as calculated from the following:   Height as of this encounter: 5' 3 (1.6 m).   Weight as of this encounter: 130 lb (59 kg). Ideal: Ideal body weight: 52.4 kg (115 lb 8.3 oz) Adjusted ideal body weight: 55 kg (121 lb 5 oz) General appearance: Well nourished, well developed, and well hydrated. In no apparent acute distress Mental status: Alert, oriented x 3 (person, place, & time)       Respiratory: No evidence of acute respiratory distress Eyes: PERLA  Lumbar Spine Area Exam  Skin & Axial Inspection: No masses, redness, or swelling Alignment: Symmetrical Functional ROM: Pain restricted ROM affecting both sides Stability: No instability detected Muscle Tone/Strength: Functionally intact. No obvious neuro-muscular anomalies detected. Sensory  (Neurological): Musculoskeletal pain pattern Palpation: No palpable anomalies       Provocative Tests: Hyperextension/rotation test: (+) bilaterally for facet joint pain. Lumbar quadrant test (Kemp's test): (+) bilaterally for facet joint pain.   Lower Extremity Exam      Side: Right lower extremity   Side: Left lower extremity  Stability: No instability observed           Stability: No instability observed          Skin & Extremity Inspection: Skin color, temperature, and hair growth are WNL. No peripheral edema or cyanosis. No masses, redness, swelling, asymmetry, or associated skin lesions. No contractures.  Skin & Extremity Inspection: Skin color, temperature, and hair growth are WNL. No peripheral edema or cyanosis. No masses, redness, swelling, asymmetry, or associated skin lesions. No contractures.  Functional ROM: Unrestricted ROM                   Functional ROM: Unrestricted ROM                  Muscle Tone/Strength: Functionally intact. No obvious neuro-muscular anomalies detected.   Muscle Tone/Strength: Functionally intact. No obvious neuro-muscular anomalies detected.  Sensory (Neurological): Unimpaired         Sensory (Neurological): Unimpaired        DTR: Patellar: deferred today Achilles: deferred today Plantar: deferred today   DTR: Patellar: deferred today Achilles: deferred today Plantar: deferred today  Palpation: No palpable anomalies   Palpation: No palpable anomalies     Assessment   Diagnosis  1. Lumbar spondylosis   2. Lumbar facet arthropathy   3. Chronic bilateral low back pain without sciatica   4. Chronic pain syndrome      Updated Problems: No problems updated.  Plan of Care  Claudia Mills is a 54 year old female with a history of chronic axial low back pain consistent with lumbar facet-mediated pain, previously treated successfully with interventional management. She underwent bilateral L3, L4, and L5 medial branch radiofrequency ablation on  07/19/2023, which resulted in approximately 80% sustained pain relief for over six months with meaningful improvement in functional capacity and activities of daily living. She now presents with recurrence of axial low back pain following recent vascular surgery and increased physical therapy activity, consistent with the expected time-limited benefit of radiofrequency denervation due to nerve regeneration.  Her current symptoms are localized to the low back without radicular features and are again impacting mobility, tolerance of rehabilitation, and overall functional status. She has failed conservative measures including medication management and physical therapy alone, and she has demonstrated a clear, durable response to prior facet denervation, meeting medical necessity criteria for repeat intervention.  Given the documented prior therapeutic benefit, functional improvement, and recurrence of pain after greater than six months, we recommend proceeding with repeat bilateral lumbar medial branch radiofrequency ablation at L3, L4, and L5 under fluoroscopic guidance to treat facet-mediated pain. Risks, benefits, and alternatives were reviewed, including bleeding, infection, neuritis, transient pain flare, incomplete relief, and the possibility of future repeat procedures. The patient verbalized understanding and wishes to proceed.  Given her recent lower extremity bypass surgery and current antiplatelet therapy, coordination with her vascular surgery team will be performed regarding management of antithrombotic medications prior to the procedure to ensure procedural safety. She will continue current pain medications as prescribed by her managing provider, and no opioid escalation is recommended at this time from an interventional standpoint.  Follow-up will occur after the procedure to assess response, functional improvement, and need for additional interventions.   Claudia Mills has a current  medication list which includes the following long-term medication(s): atorvastatin , atorvastatin , duloxetine , gabapentin , glipizide , lisinopril , lisinopril , and pantoprazole .  Pharmacotherapy (Medications Ordered): No orders of the defined types were placed in this encounter.  Orders:  Orders Placed This Encounter  Procedures   Radiofrequency,Lumbar    Standing Status:   Future    Expected Date:   03/05/2024    Expiration Date:   02/26/2025    Scheduling Instructions:     Side(s): Bilateral     Level(s): L3, L4, L5, Medial Branch Nerve(s)     Sedation: With  Sedation     Scheduling Timeframe: As soon as pre-approved    Where will this procedure be performed?:   [facility]     BLF L3-5 01/18/23, 05/03/2023   Return in about 1 week (around 03/05/2024) for B/L L3, 4, 5 RFA , in clinic IV Versed .    Recent Visits No visits were found meeting these conditions. Showing recent visits within past 90 days and meeting all other requirements Today's Visits Date Type Provider Dept  02/27/24 Office Visit Marcelino Nurse, MD [facility]  Showing today's visits and meeting all other requirements Future Appointments No visits were found meeting these conditions. Showing future appointments within next 90 days and meeting all other requirements  I discussed the assessment and treatment plan with the patient. The patient was provided an opportunity to ask questions and all were answered. The patient agreed with the plan and demonstrated an understanding of the instructions.  Patient advised to call back or seek an in-person evaluation if the symptoms or condition worsens.  I personally spent a total of 30 minutes in the care of the patient today including preparing to see the patient, getting/reviewing separately obtained history, performing a medically appropriate exam/evaluation, counseling and educating, placing orders, and documenting clinical information in the EHR.   Note  by: Nurse Marcelino, MD (TTS and AI technology used. I apologize for any typographical errors that were not detected and corrected.) Date: 02/27/2024; Time: 12:17 PM

## 2024-02-28 ENCOUNTER — Other Ambulatory Visit (INDEPENDENT_AMBULATORY_CARE_PROVIDER_SITE_OTHER)

## 2024-02-28 ENCOUNTER — Ambulatory Visit (INDEPENDENT_AMBULATORY_CARE_PROVIDER_SITE_OTHER): Admitting: Nurse Practitioner

## 2024-02-28 ENCOUNTER — Encounter (INDEPENDENT_AMBULATORY_CARE_PROVIDER_SITE_OTHER): Payer: Self-pay | Admitting: Nurse Practitioner

## 2024-02-28 VITALS — BP 139/81 | HR 100 | Resp 17 | Ht 63.0 in | Wt 129.8 lb

## 2024-02-28 DIAGNOSIS — I739 Peripheral vascular disease, unspecified: Secondary | ICD-10-CM

## 2024-02-28 DIAGNOSIS — I70223 Atherosclerosis of native arteries of extremities with rest pain, bilateral legs: Secondary | ICD-10-CM

## 2024-02-28 DIAGNOSIS — T8189XD Other complications of procedures, not elsewhere classified, subsequent encounter: Secondary | ICD-10-CM

## 2024-02-28 DIAGNOSIS — Z9889 Other specified postprocedural states: Secondary | ICD-10-CM

## 2024-02-29 ENCOUNTER — Encounter (INDEPENDENT_AMBULATORY_CARE_PROVIDER_SITE_OTHER): Payer: Self-pay | Admitting: Nurse Practitioner

## 2024-02-29 LAB — VAS US ABI WITH/WO TBI
Left ABI: 0.64
Right ABI: 0.45

## 2024-02-29 NOTE — Progress Notes (Signed)
 "  Subjective:    Patient ID: Claudia Mills, female    DOB: [birth year], 54 y.o.   MRN: [id number] Chief Complaint  Patient presents with   Follow-up    2 wk and ABI    HPI  Discussed the use of AI scribe software for clinical note transcription with the patient, who gave verbal consent to proceed.  History of Present Illness Claudia Mills is a 54 year old female with bilateral lower extremity peripheral arterial disease and prior vascular grafts who presents for evaluation of a non-healing left lower extremity wound.  She has longstanding peripheral arterial disease with prior bypass grafts to both lower extremities. She is currently experiencing a non-healing surgical wound of the left lower extremity, which had nearly closed but recently reopened after the scab detached. The wound has slightly enlarged, which she attributes to friction from clothing. She denies malodor, drainage, or other signs of infection. She continues to use Santyl ointment and Zeroform dressing with gauze for wound care, remains on prescribed antibiotics, and is compliant with wound care instructions with assistance from her niece as needed.  She reports worsening pain in her toes over the past week, described as spasms severe enough to cause tears and disrupt sleep. The pain has not improved with her current regimen of oxycodone  or muscle relaxant. She has not increased her pain medication dose. There is no malodor or drainage from the toes, but the pain is progressively worsening. She expresses concern regarding possible amputation.  Recent vascular studies demonstrate stable perfusion in the right lower extremity, but decreased flow in the left leg, with left ankle-brachial index declining from 0.78 to 0.64 and toe pressure at zero. She denies right leg symptoms and systemic signs such as fever, chills, or sweats.    Results Labs Wound culture: Within normal limits  Diagnostic Right ankle-brachial index  (02/28/2024): 0.45, unchanged from prior Right toe pressure (02/28/2024): Low flow to toes, unchanged from prior Left ankle-brachial index (02/28/2024): 0.64, decreased from prior 0.78 Left toe pressure (02/28/2024): 0.00, decreased from prior 0.50 Left lower extremity graft stenosis assessment (02/28/2024): Greater than 70% stenosis at distal graft anastomosis   Review of Systems     Objective:   Physical Exam  Physical Exam    BP 139/81   Pulse 100   Resp 17   Ht 5' 3 (1.6 m)   Wt 129 lb 12.8 oz (58.9 kg)   LMP 09/29/2015 Comment: neg preg test  BMI 22.99 kg/m   Past Medical History:  Diagnosis Date   Coronary artery disease    Diabetes mellitus without complication (HCC)    Hypertension    MI (myocardial infarction) (HCC)    PVD (peripheral vascular disease)    Tick bite    Pt states she does not remember daye of tic bite, but that is was since last visit with pain clinic and she was hospitalized for 1 day.    Social History   Socioeconomic History   Marital status: Widowed    Spouse name: Not on file   Number of children: Not on file   Years of education: Not on file   Highest education level: Not on file  Occupational History   Not on file  Tobacco Use   Smoking status: Never   Smokeless tobacco: Never  Vaping Use   Vaping status: Never Used  Substance and Sexual Activity   Alcohol use: Yes    Alcohol/week: 12.0 standard drinks of alcohol  Types: 12 Cans of beer per week   Drug use: Yes    Types: Marijuana    Comment: Patient states, I had some this morning.   Sexual activity: Not on file  Other Topics Concern   Not on file  Social History Narrative   Not on file   Social Drivers of Health   Tobacco Use: Low Risk (02/29/2024)   Patient History    Smoking Tobacco Use: Never    Smokeless Tobacco Use: Never    Passive Exposure: Not on file  Financial Resource Strain: Not on file  Food Insecurity: No Food Insecurity (10/19/2022)   Hunger  Vital Sign    Worried About Running Out of Food in the Last Year: Never true    Ran Out of Food in the Last Year: Never true  Transportation Needs: No Transportation Needs (10/19/2022)   PRAPARE - Administrator, Civil Service (Medical): No    Lack of Transportation (Non-Medical): No  Physical Activity: Not on file  Stress: Not on file  Social Connections: Not on file  Intimate Partner Violence: Not At Risk (10/19/2022)   Humiliation, Afraid, Rape, and Kick questionnaire    Fear of Current or Ex-Partner: No    Emotionally Abused: No    Physically Abused: No    Sexually Abused: No  Depression (PHQ2-9): Low Risk (09/12/2023)   Depression (PHQ2-9)    PHQ-2 Score: 2  Alcohol Screen: Not on file  Housing: Low Risk (10/19/2022)   Housing    Last Housing Risk Score: 0  Utilities: Not At Risk (10/19/2022)   AHC Utilities    Threatened with loss of utilities: No  Health Literacy: Not on file    Past Surgical History:  Procedure Laterality Date   APPLICATION OF CELL SAVER Left 11/09/2023   Procedure: APPLICATION OF CELL SAVER;  Surgeon: Jama Cordella MATSU, MD;  Location: [facility];  Service: Vascular;  Laterality: Left;   APPLICATION OF CELL SAVER N/A 11/16/2023   Procedure: APPLICATION OF CELL SAVER;  Surgeon: Jama Cordella MATSU, MD;  Location: [facility];  Service: Vascular;  Laterality: N/A;   BYPASS GRAFT POPLITEAL TO POPLITEAL  11/16/2023   Procedure: CREATION, BYPASS, ARTERIAL, POPLITEAL;  Surgeon: Jama Cordella MATSU, MD;  Location: [facility];  Service: Vascular;;   BYPASS GRAFT POPLITEAL TO TIBIAL  11/16/2023   Procedure: BYPASS GRAFT POPLITEAL TO TIBIAL (CATHLAB);  Surgeon: Jama Cordella MATSU, MD;  Location: [facility];  Service: Vascular;;   CORONARY ANGIOPLASTY WITH STENT PLACEMENT     ENDARTERECTOMY FEMORAL Left 11/09/2023   Procedure: ENDARTERECTOMY, FEMORAL;  Surgeon: Jama Cordella MATSU, MD;  Location: [facility];  Service: Vascular;  Laterality: Left;   ENDARTERECTOMY FEMORAL   11/16/2023   Procedure: ENDARTERECTOMY, FEMORAL;  Surgeon: Jama Cordella MATSU, MD;  Location: [facility];  Service: Vascular;;   ENDARTERECTOMY POPLITEAL  11/16/2023   Procedure: ENDARTERECTOMY, POPLITEAL;  Surgeon: Jama Cordella MATSU, MD;  Location: [facility];  Service: Vascular;;   ENDARTERECTOMY TIBIOPERONEAL  11/16/2023   Procedure: ENDARTERECTOMY TIBIOPERONEAL (CATHLAB);  Surgeon: Jama Cordella MATSU, MD;  Location: [facility];  Service: Vascular;;   IR KYPHO LUMBAR INC FX REDUCE BONE BX UNI/BIL CANNULATION INC/IMAGING  10/06/2022   IR RADIOLOGIST EVAL & MGMT  09/20/2022   IR RADIOLOGIST EVAL & MGMT  10/13/2022   IR RADIOLOGIST EVAL & MGMT  10/20/2022   LOWER EXTREMITY ANGIOGRAPHY Left 08/08/2023   Procedure: Lower Extremity Angiography;  Surgeon: Jama Cordella MATSU, MD;  Location: [facility];  Service: Cardiovascular;  Laterality: Left;   LOWER EXTREMITY ANGIOGRAPHY Left 11/08/2023   Procedure: Lower Extremity Angiography;  Surgeon: Jama Cordella MATSU, MD;  Location: [facility];  Service: Cardiovascular;  Laterality: Left;   LOWER EXTREMITY ANGIOGRAPHY Left 11/15/2023   Procedure: Lower Extremity Angiography;  Surgeon: Jama Cordella MATSU, MD;  Location: [facility];  Service: Cardiovascular;  Laterality: Left;   LOWER EXTREMITY INTERVENTION Left 08/08/2023   Procedure: LOWER EXTREMITY INTERVENTION;  Surgeon: Jama Cordella MATSU, MD;  Location: [facility];  Service: Cardiovascular;  Laterality: Left;   THROMBECTOMY FEMORAL ARTERY Left 11/09/2023   Procedure: THROMBECTOMY, ARTERY, FEMORAL and Popliteal;  Surgeon: Jama Cordella MATSU, MD;  Location: [facility];  Service: Vascular;  Laterality: Left;    History reviewed. No pertinent family history.  Allergies[1]     Latest Ref Rng & Units 11/30/2023    4:56 PM 11/17/2023    4:18 AM 11/16/2023    4:44 AM  CBC  WBC 4.0 - 10.5 K/uL 11.2  12.9  8.1   Hemoglobin 12.0 - 15.0 g/dL 9.5  9.3  87.8   Hematocrit 36.0 - 46.0 %  30.7  27.8  36.5   Platelets 150 - 400 K/uL 545  182  234       CMP     Component Value Date/Time   NA 138 11/30/2023 1656   K 3.7 11/30/2023 1656   CL 103 11/30/2023 1656   CO2 21 (L) 11/30/2023 1656   GLUCOSE 153 (H) 11/30/2023 1656   BUN 6 11/30/2023 1656   CREATININE 0.72 11/30/2023 1656   CREATININE 0.58 09/20/2022 0930   CALCIUM  8.8 (L) 11/30/2023 1656   PROT 8.5 (H) 10/18/2022 1648   ALBUMIN 4.6 10/18/2022 1648   AST 17 10/18/2022 1648   ALT 12 10/18/2022 1648   ALKPHOS 126 10/18/2022 1648   BILITOT 1.4 (H) 10/18/2022 1648   EGFR 109 09/20/2022 0930   GFRNONAA >60 11/30/2023 1656     VAS US  ABI WITH/WO TBI Result Date: 02/29/2024  LOWER EXTREMITY DOPPLER STUDY Patient Name:  Claudia Mills  Date of Exam:   02/28/2024 Medical Rec #: [id number]        Accession #:    [id number] Date of Birth: 12/20/1970        Patient Gender: F Patient Age:   29 years Exam Location:  [facility] Procedure:      VAS US  ABI WITH/WO TBI Referring Phys: [facility] --------------------------------------------------------------------------------  Indications: Rest pain, gangrene, and peripheral artery disease. High Risk Factors: Hypertension, hyperlipidemia, Diabetes, prior MI. Other Factors: Evaluation prior to left toe amputation.  Vascular Interventions: 11/16/2023 Left pop to ATA bpg sv, Prof to pop PTFE                          11/08/2023 Stent left EIA, SFA thrombectomy                          08/08/2023 Left SFA and EIA stent. Performing Technologist: Donnice Charnley RVT  Examination Guidelines: A complete evaluation includes at minimum, Doppler waveform signals and systolic blood pressure reading at the level of bilateral brachial, anterior tibial, and posterior tibial arteries, when vessel segments are accessible. Bilateral testing is considered an integral part of a complete examination. Photoelectric Plethysmograph (PPG) waveforms and toe systolic pressure readings are included  as required and additional duplex testing as needed. Limited  examinations for reoccurring indications may be performed as noted.  ABI Findings: +---------+------------------+-----+----------+--------+ Right    Rt Pressure (mmHg)IndexWaveform  Comment  +---------+------------------+-----+----------+--------+ Brachial 157                                       +---------+------------------+-----+----------+--------+ PTA      71                0.45 monophasic         +---------+------------------+-----+----------+--------+ DP       65                0.41 monophasic         +---------+------------------+-----+----------+--------+ Great Toe0                 0.00                    +---------+------------------+-----+----------+--------+ +---------+------------------+-----+----------+-------+ Left     Lt Pressure (mmHg)IndexWaveform  Comment +---------+------------------+-----+----------+-------+ Brachial 149                                      +---------+------------------+-----+----------+-------+ PTA      100               0.64 monophasic        +---------+------------------+-----+----------+-------+ DP       71                0.45 monophasic        +---------+------------------+-----+----------+-------+ Great Toe0                 0.00                   +---------+------------------+-----+----------+-------+ +-------+-----------+-----------+------------+------------+ ABI/TBIToday's ABIToday's TBIPrevious ABIPrevious TBI +-------+-----------+-----------+------------+------------+ Right  0.45       0.00       0.45        0.00         +-------+-----------+-----------+------------+------------+ Left   0.64       0.00       0.78        0.50         +-------+-----------+-----------+------------+------------+  Right ABIs appear essentially unchanged compared to prior study on 12/13/2024. Left ABIs appear decreased compared to prior study on  12/13/2024.  Summary: Right: Resting right ankle-brachial index indicates severe right lower extremity arterial disease. The right toe-brachial index is abnormal.  Left: Resting left ankle-brachial index indicates moderate left lower extremity arterial disease. The left toe-brachial index is abnormal.  *See table(s) above for measurements and observations.  Electronically signed by [name] MD on 02/29/2024 at 7:14:01 AM.    Final        Assessment & Plan:   1. 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 Wound reopened due to friction, no infection present. Perfusion restoration needed to prevent deterioration.  - Scheduled angiogram of left lower extremity for evaluation and potential treatment of graft stenosis. - Coordinated with surgical scheduler for procedure planning. - Discussed case with consulting vascular surgeon to finalize intervention plan. - Scheduled follow-up in approximately two weeks, subject to  clinical course and procedural scheduling.  2. Non-healing surgical wound, subsequent encounter - Continued Santyl ointment to wound. - Applied Vaseline and gauze to areas not open enough for Santyl. - Advised to avoid underwear and wear loose clothing to minimize friction. - Maintained wound care regimen with Zeroform and gauze. - Continued current antibiotic regimen.  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 Increasing pain and spasms due to poor distal perfusion. Amputation deferred until revascularization to minimize healing risks. - Deferred surgical intervention for toes until perfusion is restored. - Advised to request pain medication  refill if increased dose is effective and supply is low.   Medications Ordered Prior to Encounter[2]  There are no Patient Instructions on file for this visit. Return in about 2 weeks (around 03/13/2024) for 2 weeks no studies but we will also schedule with laura for angio .   Dannielle Baskins E Janis Sol, NP      [1]  Allergies Allergen Reactions   Bee Venom Anaphylaxis  [2]  Current Outpatient Medications on File Prior to Visit  Medication Sig Dispense Refill   aspirin  EC 81 MG tablet Take 1 tablet (81 mg total) by mouth daily. Swallow whole. 30 tablet 12   atorvastatin  (LIPITOR) 80 MG tablet Take 80 mg by mouth daily.     atorvastatin  (LIPITOR) 80 MG tablet Take 1 tablet (80 mg total) by mouth daily. 30 tablet 11   BRILINTA  90 MG TABS tablet Take 90 mg by mouth 2 (two) times daily. Patient does not take, states she will somedays     clopidogrel  (PLAVIX ) 75 MG tablet Take 1 tablet (75 mg total) by mouth daily at 6 (six) AM. 30 tablet 11   cyclobenzaprine  (FLEXERIL ) 5 MG tablet Take 1 tablet (5 mg total) by mouth 3 (three) times daily as needed for muscle spasms. 60 tablet 0   DULoxetine  (CYMBALTA ) 30 MG capsule Take 30 mg by mouth daily.     empagliflozin (JARDIANCE) 25 MG TABS tablet Take 25 mg by mouth daily.     gabapentin  (NEURONTIN ) 300 MG capsule Take 300 mg by mouth 3 (three) times daily.     glipiZIDE  (GLUCOTROL ) 5 MG tablet Take 1 tablet (5 mg total) by mouth daily before breakfast. 90 tablet 0   lisinopril  (ZESTRIL ) 20 MG tablet Take 1 tablet (20 mg total) by mouth daily. 30 tablet 1   lisinopril  (ZESTRIL ) 5 MG tablet Take 5 mg by mouth daily.     nitroGLYCERIN  (NITROSTAT ) 0.3 MG SL tablet Place 0.3 mg under the tongue every 5 (five) minutes as needed for chest pain.     oxyCODONE  (OXY IR/ROXICODONE ) 5 MG immediate release tablet Take 1-2 tablets (5-10 mg total) by mouth every 6 (six) hours as needed for moderate pain (pain score 4-6). 30 tablet 0   pantoprazole  (PROTONIX ) 40 MG  tablet Take 1 tablet (40 mg total) by mouth daily. 30 tablet 0   sulfamethoxazole -trimethoprim  (BACTRIM  DS) 800-160 MG tablet Take 1 tablet by mouth 2 (two) times daily. 20 tablet 0   No current facility-administered medications on file prior to visit.   "

## 2024-03-05 ENCOUNTER — Telehealth (INDEPENDENT_AMBULATORY_CARE_PROVIDER_SITE_OTHER): Payer: Self-pay

## 2024-03-05 ENCOUNTER — Telehealth: Payer: Self-pay

## 2024-03-05 NOTE — Telephone Encounter (Signed)
 Spoke with the patient and she has been rescheduled to have her LLE angio with Dr. Jama. Patient is scheduled to arrive to the [facility] on 03/12/24 with a 10:30 am arrival tim. Pre-procedure instructions will be sent to Mychart and mailed.

## 2024-03-05 NOTE — Telephone Encounter (Signed)
 She is scheduled for an RFA on Monday and she is having an angiogram on Tuesday and wants to make sure that is ok.

## 2024-03-05 NOTE — Telephone Encounter (Signed)
 I will ask Dr. Marcelino.

## 2024-03-05 NOTE — Telephone Encounter (Signed)
 Per Dr. Marcelino, ok to have angiogram day after RFA.

## 2024-03-06 NOTE — Telephone Encounter (Signed)
 Patient notified

## 2024-03-07 ENCOUNTER — Other Ambulatory Visit (INDEPENDENT_AMBULATORY_CARE_PROVIDER_SITE_OTHER): Payer: Self-pay | Admitting: Nurse Practitioner

## 2024-03-07 ENCOUNTER — Other Ambulatory Visit (INDEPENDENT_AMBULATORY_CARE_PROVIDER_SITE_OTHER): Payer: Self-pay

## 2024-03-07 MED ORDER — OXYCODONE HCL 5 MG PO TABS: 5.0000 mg | ORAL_TABLET | Freq: Four times a day (QID) | ORAL | 0 refills | Status: AC | PRN

## 2024-03-08 ENCOUNTER — Telehealth (INDEPENDENT_AMBULATORY_CARE_PROVIDER_SITE_OTHER): Payer: Self-pay

## 2024-03-08 NOTE — Telephone Encounter (Signed)
 Patient called at this time in reference to pain medication refill, I called patient at this time and made her aware her prescription has been sent to the pharmacy at this time.

## 2024-03-08 NOTE — Telephone Encounter (Signed)
 In error

## 2024-03-11 ENCOUNTER — Ambulatory Visit: Admitting: Student in an Organized Health Care Education/Training Program

## 2024-03-12 ENCOUNTER — Encounter: Payer: Self-pay | Admitting: Anesthesiology

## 2024-03-12 ENCOUNTER — Ambulatory Visit: Admission: RE | Admit: 2024-03-12 | Source: Home / Self Care | Admitting: Vascular Surgery

## 2024-03-12 ENCOUNTER — Encounter: Admission: RE | Payer: Self-pay | Source: Home / Self Care

## 2024-03-12 DIAGNOSIS — I70229 Atherosclerosis of native arteries of extremities with rest pain, unspecified extremity: Secondary | ICD-10-CM

## 2024-03-12 NOTE — Anesthesia Preprocedure Evaluation (Signed)
"                                    Anesthesia Evaluation  Patient identified by MRN, date of birth, ID band Patient awake    Reviewed: Allergy & Precautions, H&P , NPO status , Patient's Chart, lab work & pertinent test results, reviewed documented beta blocker date and time   Airway Mallampati: II  TM Distance: >3 FB Neck ROM: full    Dental  (+) Teeth Intact   Pulmonary neg pulmonary ROS, Patient abstained from smoking.   Pulmonary exam normal        Cardiovascular Exercise Tolerance: Poor hypertension, On Medications + angina with exertion + CAD, + Past MI and + Peripheral Vascular Disease  Normal cardiovascular exam Rate:Normal     Neuro/Psych  PSYCHIATRIC DISORDERS       Neuromuscular disease    GI/Hepatic Neg liver ROS,GERD  Medicated,,  Endo/Other  negative endocrine ROSdiabetes    Renal/GU negative Renal ROS  negative genitourinary   Musculoskeletal   Abdominal   Peds  Hematology negative hematology ROS (+)   Anesthesia Other Findings   Reproductive/Obstetrics negative OB ROS                              Anesthesia Physical Anesthesia Plan  ASA: 4  Anesthesia Plan: General LMA   Post-op Pain Management:    Induction:   PONV Risk Score and Plan:   Airway Management Planned:   Additional Equipment:   Intra-op Plan:   Post-operative Plan:   Informed Consent: I have reviewed the patients History and Physical, chart, labs and discussed the procedure including the risks, benefits and alternatives for the proposed anesthesia with the patient or authorized representative who has indicated his/her understanding and acceptance.       Plan Discussed with: CRNA  Anesthesia Plan Comments:         Anesthesia Quick Evaluation  "

## 2024-03-13 ENCOUNTER — Ambulatory Visit (INDEPENDENT_AMBULATORY_CARE_PROVIDER_SITE_OTHER): Admitting: Nurse Practitioner

## 2024-03-13 ENCOUNTER — Encounter (INDEPENDENT_AMBULATORY_CARE_PROVIDER_SITE_OTHER): Payer: Self-pay | Admitting: Nurse Practitioner

## 2024-03-13 VITALS — BP 114/63 | HR 94 | Resp 18

## 2024-03-13 DIAGNOSIS — I739 Peripheral vascular disease, unspecified: Secondary | ICD-10-CM

## 2024-03-13 DIAGNOSIS — Z9889 Other specified postprocedural states: Secondary | ICD-10-CM

## 2024-03-13 DIAGNOSIS — T8189XD Other complications of procedures, not elsewhere classified, subsequent encounter: Secondary | ICD-10-CM

## 2024-03-13 NOTE — Progress Notes (Signed)
 "  Subjective:    Patient ID: Claudia Mills, female    DOB: 09-03-1970, 54 y.o.   MRN: [id number] Chief Complaint  Patient presents with   Wound Check    2 week wound check    HPI  Discussed the use of AI scribe software for clinical note transcription with the patient, who gave verbal consent to proceed.  History of Present Illness Claudia Mills is a 54 year old female with severe peripheral arterial disease and non-healing left lower extremity wound who presents for rescheduling of vascular intervention due to worsening symptoms.  She was unable to attend her scheduled vascular procedure several days ago due to being unable to leave her home. Since then, she has experienced increased drainage from her chronic left lower extremity wound, which remains non-healing. She describes severe pain in the affected leg, particularly when her contralateral leg rests on it, as well as persistent pain in her toes. The pain is occasionally severe enough to cause her to cry and has resulted in difficulty sleeping for the past three to four nights.  She reports intermittent sensations of malaise, including mild nausea, but denies lightheadedness or significant weakness. She has not eaten since attempting to eat breakfast earlier in the day and suspects her blood glucose may be contributing to her symptoms. She notes mild improvement after sitting and drinking water . She recalls similar symptoms over a year ago, including two falls and a syncopal episode while riding in a car, for which she was evaluated in the emergency department and found not to have had a seizure or stroke.  Her left lower extremity surgical wound remains non-healing, with a scab at the site and concern for a possible retained suture. She denies purulent drainage or excessive exudate. She applies ointment to the wound, which does not exacerbate her pain. She is not currently taking Brilinta . She has pain medication at home and typically  takes one tablet at a time.    Results     Review of Systems  Constitutional:  Positive for fatigue.  Skin:  Positive for wound.  Neurological:  Positive for weakness.       Objective:   Physical Exam Vitals reviewed.  HENT:     Head: Normocephalic.  Cardiovascular:     Rate and Rhythm: Normal rate.     Pulses:          Dorsalis pedis pulses are 0 on the right side and 0 on the left side.       Posterior tibial pulses are 0 on the right side and 0 on the left side.  Pulmonary:     Effort: Pulmonary effort is normal.  Skin:    General: Skin is warm and dry.  Neurological:     Mental Status: She is alert and oriented to person, place, and time.     Motor: Weakness present.     Gait: Gait abnormal.  Psychiatric:        Mood and Affect: Mood normal.        Behavior: Behavior normal.        Thought Content: Thought content normal.        Judgment: Judgment normal.     Physical Exam    BP 114/63 (BP Location: Right Arm)   Pulse 94   Resp 18   LMP 09/29/2015 Comment: neg preg test  Past Medical History:  Diagnosis Date   Coronary artery disease    Diabetes mellitus without complication (HCC)  Hypertension    MI (myocardial infarction) (HCC)    PVD (peripheral vascular disease)    Tick bite    Pt states she does not remember daye of tic bite, but that is was since last visit with pain clinic and she was hospitalized for 1 day.    Social History   Socioeconomic History   Marital status: Widowed    Spouse name: Not on file   Number of children: Not on file   Years of education: Not on file   Highest education level: Not on file  Occupational History   Not on file  Tobacco Use   Smoking status: Never   Smokeless tobacco: Never  Vaping Use   Vaping status: Never Used  Substance and Sexual Activity   Alcohol use: Yes    Alcohol/week: 12.0 standard drinks of alcohol    Types: 12 Cans of beer per week   Drug use: Yes    Types: Marijuana     Comment: Patient states, I had some this morning.   Sexual activity: Not on file  Other Topics Concern   Not on file  Social History Narrative   Not on file   Social Drivers of Health   Tobacco Use: Low Risk (03/13/2024)   Patient History    Smoking Tobacco Use: Never    Smokeless Tobacco Use: Never    Passive Exposure: Not on file  Financial Resource Strain: Not on file  Food Insecurity: No Food Insecurity (10/19/2022)   Hunger Vital Sign    Worried About Running Out of Food in the Last Year: Never true    Ran Out of Food in the Last Year: Never true  Transportation Needs: No Transportation Needs (10/19/2022)   PRAPARE - Administrator, Civil Service (Medical): No    Lack of Transportation (Non-Medical): No  Physical Activity: Not on file  Stress: Not on file  Social Connections: Not on file  Intimate Partner Violence: Not At Risk (10/19/2022)   Humiliation, Afraid, Rape, and Kick questionnaire    Fear of Current or Ex-Partner: No    Emotionally Abused: No    Physically Abused: No    Sexually Abused: No  Depression (PHQ2-9): Low Risk (09/12/2023)   Depression (PHQ2-9)    PHQ-2 Score: 2  Alcohol Screen: Not on file  Housing: Low Risk (10/19/2022)   Housing    Last Housing Risk Score: 0  Utilities: Not At Risk (10/19/2022)   AHC Utilities    Threatened with loss of utilities: No  Health Literacy: Not on file    Past Surgical History:  Procedure Laterality Date   APPLICATION OF CELL SAVER Left 11/09/2023   Procedure: APPLICATION OF CELL SAVER;  Surgeon: Jama Cordella MATSU, MD;  Location: [facility];  Service: Vascular;  Laterality: Left;   APPLICATION OF CELL SAVER N/A 11/16/2023   Procedure: APPLICATION OF CELL SAVER;  Surgeon: Jama Cordella MATSU, MD;  Location: [facility];  Service: Vascular;  Laterality: N/A;   BYPASS GRAFT POPLITEAL TO POPLITEAL  11/16/2023   Procedure: CREATION, BYPASS, ARTERIAL, POPLITEAL;  Surgeon: Jama Cordella MATSU, MD;  Location: [facility];   Service: Vascular;;   BYPASS GRAFT POPLITEAL TO TIBIAL  11/16/2023   Procedure: BYPASS GRAFT POPLITEAL TO TIBIAL (CATHLAB);  Surgeon: Jama Cordella MATSU, MD;  Location: [facility];  Service: Vascular;;   CORONARY ANGIOPLASTY WITH STENT PLACEMENT     ENDARTERECTOMY FEMORAL Left 11/09/2023   Procedure: ENDARTERECTOMY, FEMORAL;  Surgeon: Jama Cordella MATSU, MD;  Location:  [facility];  Service: Vascular;  Laterality: Left;   ENDARTERECTOMY FEMORAL  11/16/2023   Procedure: ENDARTERECTOMY, FEMORAL;  Surgeon: Jama Cordella MATSU, MD;  Location: [facility];  Service: Vascular;;   ENDARTERECTOMY POPLITEAL  11/16/2023   Procedure: ENDARTERECTOMY, POPLITEAL;  Surgeon: Jama Cordella MATSU, MD;  Location: [facility];  Service: Vascular;;   ENDARTERECTOMY TIBIOPERONEAL  11/16/2023   Procedure: ENDARTERECTOMY TIBIOPERONEAL (CATHLAB);  Surgeon: Jama Cordella MATSU, MD;  Location: [facility];  Service: Vascular;;   IR KYPHO LUMBAR INC FX REDUCE BONE BX UNI/BIL CANNULATION INC/IMAGING  10/06/2022   IR RADIOLOGIST EVAL & MGMT  09/20/2022   IR RADIOLOGIST EVAL & MGMT  10/13/2022   IR RADIOLOGIST EVAL & MGMT  10/20/2022   LOWER EXTREMITY ANGIOGRAPHY Left 08/08/2023   Procedure: Lower Extremity Angiography;  Surgeon: Jama Cordella MATSU, MD;  Location: [facility];  Service: Cardiovascular;  Laterality: Left;   LOWER EXTREMITY ANGIOGRAPHY Left 11/08/2023   Procedure: Lower Extremity Angiography;  Surgeon: Jama Cordella MATSU, MD;  Location: [facility];  Service: Cardiovascular;  Laterality: Left;   LOWER EXTREMITY ANGIOGRAPHY Left 11/15/2023   Procedure: Lower Extremity Angiography;  Surgeon: Jama Cordella MATSU, MD;  Location: [facility];  Service: Cardiovascular;  Laterality: Left;   LOWER EXTREMITY INTERVENTION Left 08/08/2023   Procedure: LOWER EXTREMITY INTERVENTION;  Surgeon: Jama Cordella MATSU, MD;  Location: [facility];  Service: Cardiovascular;  Laterality: Left;   THROMBECTOMY FEMORAL ARTERY Left  11/09/2023   Procedure: THROMBECTOMY, ARTERY, FEMORAL and Popliteal;  Surgeon: Jama Cordella MATSU, MD;  Location: [facility];  Service: Vascular;  Laterality: Left;    History reviewed. No pertinent family history.  Allergies[1]     Latest Ref Rng & Units 11/30/2023    4:56 PM 11/17/2023    4:18 AM 11/16/2023    4:44 AM  CBC  WBC 4.0 - 10.5 K/uL 11.2  12.9  8.1   Hemoglobin 12.0 - 15.0 g/dL 9.5  9.3  87.8   Hematocrit 36.0 - 46.0 % 30.7  27.8  36.5   Platelets 150 - 400 K/uL 545  182  234       CMP     Component Value Date/Time   NA 138 11/30/2023 1656   K 3.7 11/30/2023 1656   CL 103 11/30/2023 1656   CO2 21 (L) 11/30/2023 1656   GLUCOSE 153 (H) 11/30/2023 1656   BUN 6 11/30/2023 1656   CREATININE 0.72 11/30/2023 1656   CREATININE 0.58 09/20/2022 0930   CALCIUM  8.8 (L) 11/30/2023 1656   PROT 8.5 (H) 10/18/2022 1648   ALBUMIN 4.6 10/18/2022 1648   AST 17 10/18/2022 1648   ALT 12 10/18/2022 1648   ALKPHOS 126 10/18/2022 1648   BILITOT 1.4 (H) 10/18/2022 1648   EGFR 109 09/20/2022 0930   GFRNONAA >60 11/30/2023 1656     VAS US  ABI WITH/WO TBI Result Date: 02/29/2024  LOWER EXTREMITY DOPPLER STUDY Patient Name:  Dymon Summerhill  Date of Exam:   02/28/2024 Medical Rec #: [id number]        Accession #:    [id number] Date of Birth: 1970/12/05        Patient Gender: F Patient Age:   53 years Exam Location:  [facility] Procedure:      VAS US  ABI WITH/WO TBI Referring Phys: [facility] --------------------------------------------------------------------------------  Indications: Rest pain, gangrene, and peripheral artery disease. High Risk Factors: Hypertension, hyperlipidemia, Diabetes, prior MI. Other Factors: Evaluation prior to left  toe amputation.  Vascular Interventions: 11/16/2023 Left pop to ATA bpg sv, Prof to pop PTFE                          11/08/2023 Stent left EIA, SFA thrombectomy                          08/08/2023 Left SFA and EIA stent. Performing  Technologist: Donnice Charnley RVT  Examination Guidelines: A complete evaluation includes at minimum, Doppler waveform signals and systolic blood pressure reading at the level of bilateral brachial, anterior tibial, and posterior tibial arteries, when vessel segments are accessible. Bilateral testing is considered an integral part of a complete examination. Photoelectric Plethysmograph (PPG) waveforms and toe systolic pressure readings are included as required and additional duplex testing as needed. Limited examinations for reoccurring indications may be performed as noted.  ABI Findings: +---------+------------------+-----+----------+--------+ Right    Rt Pressure (mmHg)IndexWaveform  Comment  +---------+------------------+-----+----------+--------+ Brachial 157                                       +---------+------------------+-----+----------+--------+ PTA      71                0.45 monophasic         +---------+------------------+-----+----------+--------+ DP       65                0.41 monophasic         +---------+------------------+-----+----------+--------+ Great Toe0                 0.00                    +---------+------------------+-----+----------+--------+ +---------+------------------+-----+----------+-------+ Left     Lt Pressure (mmHg)IndexWaveform  Comment +---------+------------------+-----+----------+-------+ Brachial 149                                      +---------+------------------+-----+----------+-------+ PTA      100               0.64 monophasic        +---------+------------------+-----+----------+-------+ DP       71                0.45 monophasic        +---------+------------------+-----+----------+-------+ Great Toe0                 0.00                   +---------+------------------+-----+----------+-------+ +-------+-----------+-----------+------------+------------+ ABI/TBIToday's ABIToday's TBIPrevious  ABIPrevious TBI +-------+-----------+-----------+------------+------------+ Right  0.45       0.00       0.45        0.00         +-------+-----------+-----------+------------+------------+ Left   0.64       0.00       0.78        0.50         +-------+-----------+-----------+------------+------------+  Right ABIs appear essentially unchanged compared to prior study on 12/13/2024. Left ABIs appear decreased compared to prior study on 12/13/2024.  Summary: Right: Resting right ankle-brachial index indicates severe right lower extremity arterial disease. The right toe-brachial index is abnormal.  Left: Resting left ankle-brachial index indicates  moderate left lower extremity arterial disease. The left toe-brachial index is abnormal.  *See table(s) above for measurements and observations.  Electronically signed by [name] MD on 02/29/2024 at 7:14:01 AM.    Final        Assessment & Plan:   1. Peripheral arterial disease with history of revascularization (Primary) Atherosclerosis of native arteries of both lower extremities with rest pain Chronic, severe peripheral arterial disease with significant narrowing of the bypass, resulting in poor perfusion and rest pain. Symptoms and wound healing are not expected to improve until revascularization is performed. Procedural access may be challenging due to prior bypass, and a crossover approach may be required. Pain is currently managed with oral analgesics as a temporary measure. Risks discussed include procedural access difficulties and the need for targeted intervention to restore perfusion. - Rescheduled angiogram and intervention to restore perfusion to the most symptomatic leg as soon as possible, with possible crossover approach. - Provided anticipatory guidance regarding the procedure, including sedation with fentanyl  and midazolam . - Advised use of oral analgesics for symptomatic relief until revascularization. - Checked blood pressure  and provided a snack to address possible hypoglycemia contributing to symptoms.   2. Non-healing surgical wound, subsequent encounter Non-healing surgical wound of lower extremity Persistent non-healing wound likely secondary to inadequate perfusion from underlying arterial disease. Increased drainage present, without evidence of gross infection or purulent drainage. Healing is unlikely until perfusion is improved. - Instructed staff to clean and dress the wound. - Applied ointment safe for healthy tissue. - Advised against probing the wound to avoid additional injury. - Planned vascular intervention to improve perfusion and promote wound healing.  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 Improvement is not expected until revascularization is achieved. - Planned angiogram and revascularization to improve distal perfusion and address underlying ischemia.   Assessment and Plan Assessment & Plan        Medications Ordered Prior to Encounter[2]  There are no Patient Instructions on file for this visit. No follow-ups on file.   Narya Beavin E Donnel Venuto, NP      [1]  Allergies Allergen Reactions   Bee Venom Anaphylaxis  [2]  Current Outpatient Medications on File Prior to Visit  Medication Sig Dispense Refill   aspirin  EC 81 MG tablet Take 1 tablet (81 mg total) by mouth daily. Swallow whole. 30 tablet 12   atorvastatin  (LIPITOR) 80 MG tablet Take 80 mg by mouth daily.     atorvastatin  (LIPITOR) 80 MG tablet Take 1 tablet (80 mg total) by mouth daily. 30 tablet 11   clopidogrel  (PLAVIX ) 75 MG tablet Take 1 tablet (75 mg total) by mouth daily at 6 (six) AM. 30 tablet 11   cyclobenzaprine  (FLEXERIL ) 5 MG tablet Take 1 tablet (5 mg total) by mouth 3 (three) times daily as needed for muscle spasms. 60 tablet 0   DULoxetine  (CYMBALTA ) 30 MG capsule Take 30 mg by mouth daily.     empagliflozin  (JARDIANCE) 25 MG TABS tablet Take 25 mg by mouth daily.     gabapentin  (NEURONTIN ) 300 MG capsule Take 300 mg by mouth 3 (three) times daily.     glipiZIDE  (GLUCOTROL ) 5 MG tablet Take 1 tablet (5 mg total) by mouth daily before breakfast. 90 tablet 0   lisinopril  (ZESTRIL ) 20 MG tablet Take 1 tablet (20 mg total) by mouth daily. 30 tablet 1   lisinopril  (ZESTRIL ) 5 MG  tablet Take 5 mg by mouth daily.     nitroGLYCERIN  (NITROSTAT ) 0.3 MG SL tablet Place 0.3 mg under the tongue every 5 (five) minutes as needed for chest pain.     oxyCODONE  (OXY IR/ROXICODONE ) 5 MG immediate release tablet Take 1-2 tablets (5-10 mg total) by mouth every 6 (six) hours as needed for moderate pain (pain score 4-6). 30 tablet 0   pantoprazole  (PROTONIX ) 40 MG tablet Take 1 tablet (40 mg total) by mouth daily. 30 tablet 0   sulfamethoxazole -trimethoprim  (BACTRIM  DS) 800-160 MG tablet Take 1 tablet by mouth 2 (two) times daily. (Patient not taking: Reported on 03/13/2024) 20 tablet 0   No current facility-administered medications on file prior to visit.   "

## 2024-03-15 ENCOUNTER — Inpatient Hospital Stay
Admission: EM | Admit: 2024-03-15 | Source: Home / Self Care | Attending: Internal Medicine | Admitting: Internal Medicine

## 2024-03-15 ENCOUNTER — Other Ambulatory Visit: Payer: Self-pay

## 2024-03-15 ENCOUNTER — Emergency Department

## 2024-03-15 ENCOUNTER — Telehealth (INDEPENDENT_AMBULATORY_CARE_PROVIDER_SITE_OTHER): Payer: Self-pay

## 2024-03-15 DIAGNOSIS — E1165 Type 2 diabetes mellitus with hyperglycemia: Secondary | ICD-10-CM | POA: Diagnosis present

## 2024-03-15 DIAGNOSIS — I10 Essential (primary) hypertension: Secondary | ICD-10-CM | POA: Diagnosis present

## 2024-03-15 DIAGNOSIS — L03116 Cellulitis of left lower limb: Secondary | ICD-10-CM

## 2024-03-15 DIAGNOSIS — I70222 Atherosclerosis of native arteries of extremities with rest pain, left leg: Secondary | ICD-10-CM | POA: Diagnosis not present

## 2024-03-15 DIAGNOSIS — I70229 Atherosclerosis of native arteries of extremities with rest pain, unspecified extremity: Secondary | ICD-10-CM

## 2024-03-15 DIAGNOSIS — T8149XA Infection following a procedure, other surgical site, initial encounter: Secondary | ICD-10-CM

## 2024-03-15 DIAGNOSIS — I2510 Atherosclerotic heart disease of native coronary artery without angina pectoris: Secondary | ICD-10-CM

## 2024-03-15 DIAGNOSIS — M79672 Pain in left foot: Principal | ICD-10-CM

## 2024-03-15 DIAGNOSIS — T8189XA Other complications of procedures, not elsewhere classified, initial encounter: Secondary | ICD-10-CM

## 2024-03-15 DIAGNOSIS — I96 Gangrene, not elsewhere classified: Secondary | ICD-10-CM | POA: Diagnosis present

## 2024-03-15 LAB — CBC WITH DIFFERENTIAL/PLATELET
Abs Immature Granulocytes: 0.02 10*3/uL (ref 0.00–0.07)
Basophils Absolute: 0 10*3/uL (ref 0.0–0.1)
Basophils Relative: 1 %
Eosinophils Absolute: 0.1 10*3/uL (ref 0.0–0.5)
Eosinophils Relative: 1 %
HCT: 35.3 % — ABNORMAL LOW (ref 36.0–46.0)
Hemoglobin: 11.5 g/dL — ABNORMAL LOW (ref 12.0–15.0)
Immature Granulocytes: 0 %
Lymphocytes Relative: 11 %
Lymphs Abs: 0.7 10*3/uL (ref 0.7–4.0)
MCH: 29.6 pg (ref 26.0–34.0)
MCHC: 32.6 g/dL (ref 30.0–36.0)
MCV: 91 fL (ref 80.0–100.0)
Monocytes Absolute: 0.3 10*3/uL (ref 0.1–1.0)
Monocytes Relative: 5 %
Neutro Abs: 5.5 10*3/uL (ref 1.7–7.7)
Neutrophils Relative %: 82 %
Platelets: 268 10*3/uL (ref 150–400)
RBC: 3.88 MIL/uL (ref 3.87–5.11)
RDW: 17.4 % — ABNORMAL HIGH (ref 11.5–15.5)
WBC: 6.7 10*3/uL (ref 4.0–10.5)
nRBC: 0 % (ref 0.0–0.2)

## 2024-03-15 LAB — COMPREHENSIVE METABOLIC PANEL WITH GFR
ALT: 25 U/L (ref 0–44)
AST: 28 U/L (ref 15–41)
Albumin: 4.8 g/dL (ref 3.5–5.0)
Alkaline Phosphatase: 140 U/L — ABNORMAL HIGH (ref 38–126)
Anion gap: 13 (ref 5–15)
BUN: 5 mg/dL — ABNORMAL LOW (ref 6–20)
CO2: 24 mmol/L (ref 22–32)
Calcium: 10 mg/dL (ref 8.9–10.3)
Chloride: 94 mmol/L — ABNORMAL LOW (ref 98–111)
Creatinine, Ser: 0.69 mg/dL (ref 0.44–1.00)
GFR, Estimated: >60 mL/min
Glucose, Bld: 240 mg/dL — ABNORMAL HIGH (ref 70–99)
Potassium: 3.9 mmol/L (ref 3.5–5.1)
Sodium: 131 mmol/L — ABNORMAL LOW (ref 135–145)
Total Bilirubin: 0.3 mg/dL (ref 0.0–1.2)
Total Protein: 8.6 g/dL — ABNORMAL HIGH (ref 6.5–8.1)

## 2024-03-15 LAB — LACTIC ACID, PLASMA
Lactic Acid, Venous: 2 mmol/L (ref 0.5–1.9)
Lactic Acid, Venous: 1.3 mmol/L (ref 0.5–1.9)

## 2024-03-15 LAB — APTT: aPTT: 35 s (ref 24–36)

## 2024-03-15 LAB — PROTIME-INR
INR: 1 (ref 0.8–1.2)
Prothrombin Time: 13.3 s (ref 11.4–15.2)

## 2024-03-15 LAB — CBG MONITORING, ED: Glucose-Capillary: 249 mg/dL — ABNORMAL HIGH (ref 70–99)

## 2024-03-15 MED ORDER — HYDRALAZINE HCL 20 MG/ML IJ SOLN: 5.0000 mg | INTRAMUSCULAR | Status: DC | PRN

## 2024-03-15 MED ORDER — VANCOMYCIN HCL 1250 MG/250ML IV SOLN
1250.0000 mg | INTRAVENOUS | Status: DC
  Administered 2024-03-16 – 2024-03-17 (×2): 1250 mg via INTRAVENOUS
  Filled 2024-03-15 (×3): qty 250

## 2024-03-15 MED ORDER — GABAPENTIN 300 MG PO CAPS
300.0000 mg | ORAL_CAPSULE | Freq: Three times a day (TID) | ORAL | Status: AC
  Administered 2024-03-15 – 2024-03-22 (×21): 300 mg via ORAL
  Filled 2024-03-15 (×21): qty 1

## 2024-03-15 MED ORDER — ASPIRIN 81 MG PO TBEC
81.0000 mg | DELAYED_RELEASE_TABLET | Freq: Every day | ORAL | Status: AC
  Administered 2024-03-16 – 2024-03-22 (×6): 81 mg via ORAL
  Filled 2024-03-15 (×6): qty 1

## 2024-03-15 MED ORDER — CYCLOBENZAPRINE HCL 5 MG PO TABS
5.0000 mg | ORAL_TABLET | Freq: Three times a day (TID) | ORAL | Status: DC | PRN
  Administered 2024-03-17 (×2): 5 mg via ORAL
  Filled 2024-03-15 (×2): qty 1

## 2024-03-15 MED ORDER — ACETAMINOPHEN 325 MG PO TABS: 650.0000 mg | ORAL_TABLET | Freq: Four times a day (QID) | ORAL | Status: DC | PRN

## 2024-03-15 MED ORDER — ONDANSETRON HCL 4 MG PO TABS
4.0000 mg | ORAL_TABLET | Freq: Four times a day (QID) | ORAL | Status: DC | PRN
  Administered 2024-03-20: 4 mg via ORAL
  Filled 2024-03-15: qty 1

## 2024-03-15 MED ORDER — HYDROMORPHONE HCL 1 MG/ML IJ SOLN
1.0000 mg | Freq: Once | INTRAMUSCULAR | Status: AC
  Administered 2024-03-15: 1 mg via INTRAVENOUS
  Filled 2024-03-15: qty 1

## 2024-03-15 MED ORDER — ONDANSETRON HCL 4 MG/2ML IJ SOLN
4.0000 mg | Freq: Four times a day (QID) | INTRAMUSCULAR | Status: DC | PRN
  Administered 2024-03-16: 4 mg via INTRAVENOUS
  Filled 2024-03-15: qty 2

## 2024-03-15 MED ORDER — PIPERACILLIN-TAZOBACTAM 3.375 G IVPB 30 MIN
3.3750 g | Freq: Once | INTRAVENOUS | Status: AC
  Administered 2024-03-15: 3.375 g via INTRAVENOUS
  Filled 2024-03-15: qty 50

## 2024-03-15 MED ORDER — DULOXETINE HCL 30 MG PO CPEP
30.0000 mg | ORAL_CAPSULE | Freq: Every day | ORAL | Status: DC
  Administered 2024-03-16 – 2024-03-20 (×5): 30 mg via ORAL
  Filled 2024-03-15 (×5): qty 1

## 2024-03-15 MED ORDER — VANCOMYCIN HCL 1250 MG/250ML IV SOLN
1250.0000 mg | Freq: Once | INTRAVENOUS | Status: AC
  Administered 2024-03-15: 1250 mg via INTRAVENOUS
  Filled 2024-03-15: qty 250

## 2024-03-15 MED ORDER — SODIUM CHLORIDE 0.9 % IV BOLUS
2000.0000 mL | Freq: Once | INTRAVENOUS | Status: AC
  Administered 2024-03-15: 2000 mL via INTRAVENOUS

## 2024-03-15 MED ORDER — LISINOPRIL 5 MG PO TABS
5.0000 mg | ORAL_TABLET | Freq: Every day | ORAL | Status: AC
  Administered 2024-03-16 – 2024-03-22 (×6): 5 mg via ORAL
  Filled 2024-03-15 (×6): qty 1

## 2024-03-15 MED ORDER — PIPERACILLIN-TAZOBACTAM 3.375 G IVPB
3.3750 g | Freq: Three times a day (TID) | INTRAVENOUS | Status: DC
  Administered 2024-03-16 – 2024-03-18 (×8): 3.375 g via INTRAVENOUS
  Filled 2024-03-15 (×8): qty 50

## 2024-03-15 MED ORDER — ALBUTEROL SULFATE (2.5 MG/3ML) 0.083% IN NEBU: 2.5000 mg | INHALATION_SOLUTION | RESPIRATORY_TRACT | Status: AC | PRN

## 2024-03-15 MED ORDER — HEPARIN (PORCINE) 25000 UT/250ML-% IV SOLN
1000.0000 [IU]/h | INTRAVENOUS | Status: DC
  Administered 2024-03-15 – 2024-03-18 (×2): 1000 [IU]/h via INTRAVENOUS
  Filled 2024-03-15 (×4): qty 250

## 2024-03-15 MED ORDER — HEPARIN SODIUM (PORCINE) 5000 UNIT/ML IJ SOLN
60.0000 [IU]/kg | Freq: Once | INTRAMUSCULAR | Status: AC
  Administered 2024-03-15: 3550 [IU] via INTRAVENOUS
  Filled 2024-03-15: qty 1

## 2024-03-15 MED ORDER — ACETAMINOPHEN 650 MG RE SUPP: 650.0000 mg | Freq: Four times a day (QID) | RECTAL | Status: DC | PRN

## 2024-03-15 MED ORDER — ATORVASTATIN CALCIUM 80 MG PO TABS
80.0000 mg | ORAL_TABLET | Freq: Every day | ORAL | Status: AC
  Administered 2024-03-16 – 2024-03-22 (×6): 80 mg via ORAL
  Filled 2024-03-15: qty 4
  Filled 2024-03-15: qty 1
  Filled 2024-03-15 (×2): qty 4
  Filled 2024-03-15 (×2): qty 1

## 2024-03-15 MED ORDER — HYDROMORPHONE HCL 1 MG/ML IJ SOLN
0.5000 mg | INTRAMUSCULAR | Status: DC | PRN
  Administered 2024-03-16 – 2024-03-17 (×4): 1 mg via INTRAVENOUS
  Administered 2024-03-17: 0.5 mg via INTRAVENOUS
  Administered 2024-03-18 – 2024-03-21 (×12): 1 mg via INTRAVENOUS
  Filled 2024-03-15 (×16): qty 1

## 2024-03-15 MED ORDER — NITROGLYCERIN 0.3 MG SL SUBL: 0.3000 mg | SUBLINGUAL_TABLET | SUBLINGUAL | Status: AC | PRN

## 2024-03-15 MED ORDER — OXYCODONE HCL 5 MG PO TABS
5.0000 mg | ORAL_TABLET | ORAL | Status: DC | PRN
  Administered 2024-03-16 – 2024-03-20 (×11): 5 mg via ORAL
  Filled 2024-03-15 (×12): qty 1

## 2024-03-15 NOTE — ED Provider Triage Note (Signed)
 Emergency Medicine Provider Triage Evaluation Note  Claudia Mills , a 54 y.o. female  was evaluated in triage.  Pt complains of rotting foot, pain and redness.  Review of Systems  Positive:  Negative:   Physical Exam  BP (!) 141/86   Temp 99.8 F (37.7 C) (Oral)   Resp 18   Ht 5' 3 (1.6 m)   Wt 59 kg   LMP 09/29/2015 Comment: neg preg test  SpO2 100%   BMI 23.03 kg/m  Gen:   Awake, no distress   Resp:  Normal effort  MSK:   Moves extremities without difficulty , necrotic toes on left foot Other:    Medical Decision Making  Medically screening exam initiated at 4:41 PM.  Appropriate orders placed.  [name] was informed that the remainder of the evaluation will be completed by another provider, this initial triage assessment does not replace that evaluation, and the importance of remaining in the ED until their evaluation is complete.     Gasper Devere ORN, PA-C 03/15/24 1642

## 2024-03-15 NOTE — ED Triage Notes (Signed)
 Pt to ED for L foot pain. Pt had surgery on L leg September 2025. Pt reports having an infection at surgical site on calf. Pt reports pain beginning in the toes yesterday. Pts great and second toe are black with redness going up foot.

## 2024-03-15 NOTE — Assessment & Plan Note (Addendum)
 Continue lisinopri

## 2024-03-15 NOTE — Assessment & Plan Note (Addendum)
 No complaints of chest pain Continue lisinopril  atorvastatin , aspirin  with nitroglycerin  sublingual as needed

## 2024-03-15 NOTE — H&P (Signed)
 " History and Physical    Patient: Claudia Mills FMW:[id number] DOB: 1971/01/26 DOA: 03/15/2024 DOS: the patient was seen and examined on 03/15/2024 PCP: Lorel Maxie LABOR, MD  Patient coming from: Home  Chief Complaint:  Chief Complaint  Patient presents with   Foot Pain    HPI: Claudia Mills is a 54 y.o. female with medical history significant for CAD with history of NSTEMI s/p stent mid LAD, HTN, DM with neuropathy, PAD with prior revascularization 10/2023, currently with dry gangrene of toes left foot, ongoing rest pain,chronic nonhealing surgical wound due to suspected narrowing of prior bypass,  being admitted with suspected cellulitis due to wound infection as well as limb ischemia.  Last appointment with vascular was 03/11/2024, and angiogram was scheduled for 03/12/2024 however patient could not make it due to the weather. Today she presents with worsening left foot pain, starting in the toes and extending to the calf.  She denies fever or chills. On arrival, temp 99.8 and tachycardic to the 120s with BP 141/86. Labs mostly unremarkable with normal WBC and lactic acid and mild hyperglycemia of 240  EKG, personally interpreted showing sinus tachycardia at 132 Chest x-ray nonacute Left foot x-ray showing soft tissue ulceration about the 1st and 2nd toes without evidence of osteomyelitis  Patient was started on vancomycin  and Zosyn  and given a fluid bolus Started on heparin  infusion Admission requested     Past Medical History:  Diagnosis Date   Coronary artery disease    Diabetes mellitus without complication (HCC)    Hypertension    MI (myocardial infarction) (HCC)    PVD (peripheral vascular disease)    Tick bite    Pt states she does not remember daye of tic bite, but that is was since last visit with pain clinic and she was hospitalized for 1 day.   Past Surgical History:  Procedure Laterality Date   APPLICATION OF CELL SAVER Left 11/09/2023   Procedure: APPLICATION OF  CELL SAVER;  Surgeon: Jama Cordella MATSU, MD;  Location: [facility];  Service: Vascular;  Laterality: Left;   APPLICATION OF CELL SAVER N/A 11/16/2023   Procedure: APPLICATION OF CELL SAVER;  Surgeon: Jama Cordella MATSU, MD;  Location: [facility];  Service: Vascular;  Laterality: N/A;   BYPASS GRAFT POPLITEAL TO POPLITEAL  11/16/2023   Procedure: CREATION, BYPASS, ARTERIAL, POPLITEAL;  Surgeon: Jama Cordella MATSU, MD;  Location: [facility];  Service: Vascular;;   BYPASS GRAFT POPLITEAL TO TIBIAL  11/16/2023   Procedure: BYPASS GRAFT POPLITEAL TO TIBIAL (CATHLAB);  Surgeon: Jama Cordella MATSU, MD;  Location: [facility];  Service: Vascular;;   CORONARY ANGIOPLASTY WITH STENT PLACEMENT     ENDARTERECTOMY FEMORAL Left 11/09/2023   Procedure: ENDARTERECTOMY, FEMORAL;  Surgeon: Jama Cordella MATSU, MD;  Location: [facility];  Service: Vascular;  Laterality: Left;   ENDARTERECTOMY FEMORAL  11/16/2023   Procedure: ENDARTERECTOMY, FEMORAL;  Surgeon: Jama Cordella MATSU, MD;  Location: [facility];  Service: Vascular;;   ENDARTERECTOMY POPLITEAL  11/16/2023   Procedure: ENDARTERECTOMY, POPLITEAL;  Surgeon: Jama Cordella MATSU, MD;  Location: [facility];  Service: Vascular;;   ENDARTERECTOMY TIBIOPERONEAL  11/16/2023   Procedure: ENDARTERECTOMY TIBIOPERONEAL (CATHLAB);  Surgeon: Jama Cordella MATSU, MD;  Location: [facility];  Service: Vascular;;   IR KYPHO LUMBAR INC FX REDUCE BONE BX UNI/BIL CANNULATION INC/IMAGING  10/06/2022   IR RADIOLOGIST EVAL & MGMT  09/20/2022   IR RADIOLOGIST EVAL & MGMT  10/13/2022   IR RADIOLOGIST EVAL & MGMT  10/20/2022  LOWER EXTREMITY ANGIOGRAPHY Left 08/08/2023   Procedure: Lower Extremity Angiography;  Surgeon: Jama Cordella MATSU, MD;  Location: [facility];  Service: Cardiovascular;  Laterality: Left;   LOWER EXTREMITY ANGIOGRAPHY Left 11/08/2023   Procedure: Lower Extremity Angiography;  Surgeon: Jama Cordella MATSU, MD;  Location: [facility];  Service: Cardiovascular;  Laterality:  Left;   LOWER EXTREMITY ANGIOGRAPHY Left 11/15/2023   Procedure: Lower Extremity Angiography;  Surgeon: Jama Cordella MATSU, MD;  Location: [facility];  Service: Cardiovascular;  Laterality: Left;   LOWER EXTREMITY INTERVENTION Left 08/08/2023   Procedure: LOWER EXTREMITY INTERVENTION;  Surgeon: Jama Cordella MATSU, MD;  Location: [facility];  Service: Cardiovascular;  Laterality: Left;   THROMBECTOMY FEMORAL ARTERY Left 11/09/2023   Procedure: THROMBECTOMY, ARTERY, FEMORAL and Popliteal;  Surgeon: Jama Cordella MATSU, MD;  Location: [facility];  Service: Vascular;  Laterality: Left;   Social History:  reports that she has never smoked. She has never used smokeless tobacco. She reports current alcohol use of about 12.0 standard drinks of alcohol per week. She reports current drug use. Drug: Marijuana.  Allergies[1]  No family history on file.  Prior to Admission medications  Medication Sig Start Date End Date Taking? Authorizing Provider  aspirin  EC 81 MG tablet Take 1 tablet (81 mg total) by mouth daily. Swallow whole. 11/24/23  Yes Pace, Brien R, NP  atorvastatin  (LIPITOR ) 80 MG tablet Take 80 mg by mouth daily. 11/07/23  Yes [provider]  atorvastatin  (LIPITOR ) 80 MG tablet Take 1 tablet (80 mg total) by mouth daily. 11/23/23  Yes Pace, Brien R, NP  clopidogrel  (PLAVIX ) 75 MG tablet Take 1 tablet (75 mg total) by mouth daily at 6 (six) AM. 11/24/23  Yes Pace, Brien R, NP  cyclobenzaprine  (FLEXERIL ) 5 MG tablet Take 1 tablet (5 mg total) by mouth 3 (three) times daily as needed for muscle spasms. 01/16/24  Yes Brown, Fallon E, NP  DULoxetine  (CYMBALTA ) 30 MG capsule Take 30 mg by mouth daily.   Yes [provider]  empagliflozin (JARDIANCE) 25 MG TABS tablet Take 25 mg by mouth daily.   Yes [provider]  gabapentin  (NEURONTIN ) 300 MG capsule Take 300 mg by mouth 3 (three) times daily.   Yes [provider]  glipiZIDE  (GLUCOTROL ) 5 MG tablet  Take 1 tablet (5 mg total) by mouth daily before breakfast. 10/19/22 03/15/24 Yes Caleen Qualia, MD  lisinopril  (ZESTRIL ) 5 MG tablet Take 5 mg by mouth daily. 02/12/24  Yes [provider]  nitroGLYCERIN  (NITROSTAT ) 0.3 MG SL tablet Place 0.3 mg under the tongue every 5 (five) minutes as needed for chest pain.   Yes [provider]  oxyCODONE  (OXY IR/ROXICODONE ) 5 MG immediate release tablet Take 1-2 tablets (5-10 mg total) by mouth every 6 (six) hours as needed for moderate pain (pain score 4-6). 03/07/24  Yes Brown, Fallon E, NP  pantoprazole  (PROTONIX ) 40 MG tablet Take 1 tablet (40 mg total) by mouth daily. 10/19/22  Yes Caleen Qualia, MD  lisinopril  (ZESTRIL ) 20 MG tablet Take 1 tablet (20 mg total) by mouth daily. Patient not taking: Reported on 03/15/2024 10/20/22   Amin, Sumayya, MD  sulfamethoxazole -trimethoprim  (BACTRIM  DS) 800-160 MG tablet Take 1 tablet by mouth 2 (two) times daily. Patient not taking: Reported on 03/13/2024 02/12/24   Delores Orvin BRAVO, NP    Physical Exam: Vitals:   03/15/24 1800 03/15/24 1830 03/15/24 1900 03/15/24 1920  BP:  (!) 155/73 (!) 155/76 ROLLEN)  155/76  Pulse:  (!) 122 (!) 120 (!) 122  Resp: 16 16  17   Temp:    99 F (37.2 C)  TempSrc:      SpO2:  100% 100% 100%  Weight:      Height:       Physical Exam Vitals and nursing note reviewed.  Constitutional:      General: She is not in acute distress. HENT:     Head: Normocephalic and atraumatic.  Cardiovascular:     Rate and Rhythm: Regular rhythm. Tachycardia present.     Heart sounds: Normal heart sounds.  Pulmonary:     Effort: Pulmonary effort is normal.     Breath sounds: Normal breath sounds.  Abdominal:     Palpations: Abdomen is soft.     Tenderness: There is no abdominal tenderness.  Neurological:     Mental Status: Mental status is at baseline.     Labs on Admission: I have personally reviewed following labs and imaging studies  CBC: Recent Labs  Lab 03/15/24 1650   WBC 6.7  NEUTROABS 5.5  HGB 11.5*  HCT 35.3*  MCV 91.0  PLT 268   Basic Metabolic Panel: Recent Labs  Lab 03/15/24 1650  NA 131*  K 3.9  CL 94*  CO2 24  GLUCOSE 240*  BUN 5*  CREATININE 0.69  CALCIUM  10.0   GFR: Estimated Creatinine Clearance: 67.3 mL/min (by C-G formula based on SCr of 0.69 mg/dL). Liver Function Tests: Recent Labs  Lab 03/15/24 1650  AST 28  ALT 25  ALKPHOS 140*  BILITOT 0.3  PROT 8.6*  ALBUMIN 4.8   No results for input(s): LIPASE, AMYLASE in the last 168 hours. No results for input(s): AMMONIA in the last 168 hours. Coagulation Profile: Recent Labs  Lab 03/15/24 1650  INR 1.0   Cardiac Enzymes: No results for input(s): CKTOTAL, CKMB, CKMBINDEX, TROPONINI in the last 168 hours. BNP (last 3 results) No results for input(s): PROBNP in the last 8760 hours. HbA1C: No results for input(s): HGBA1C in the last 72 hours. CBG: Recent Labs  Lab 03/15/24 1642  GLUCAP 249*   Lipid Profile: No results for input(s): CHOL, HDL, LDLCALC, TRIG, CHOLHDL, LDLDIRECT in the last 72 hours. Thyroid  Function Tests: No results for input(s): TSH, T4TOTAL, FREET4, T3FREE, THYROIDAB in the last 72 hours. Anemia Panel: No results for input(s): VITAMINB12, FOLATE, FERRITIN, TIBC, IRON, RETICCTPCT in the last 72 hours. Urine analysis:    Component Value Date/Time   COLORURINE STRAW (A) 10/18/2022 1648   APPEARANCEUR CLEAR (A) 10/18/2022 1648   APPEARANCEUR Clear 04/23/2012 1141   LABSPEC 1.028 10/18/2022 1648   LABSPEC 1.015 04/23/2012 1141   PHURINE 6.0 10/18/2022 1648   GLUCOSEU >=500 (A) 10/18/2022 1648   GLUCOSEU >=500 04/23/2012 1141   HGBUR SMALL (A) 10/18/2022 1648   BILIRUBINUR NEGATIVE 10/18/2022 1648   BILIRUBINUR Negative 04/23/2012 1141   KETONESUR 80 (A) 10/18/2022 1648   PROTEINUR 100 (A) 10/18/2022 1648   NITRITE NEGATIVE 10/18/2022 1648   LEUKOCYTESUR TRACE (A) 10/18/2022 1648    LEUKOCYTESUR Negative 04/23/2012 1141    Radiological Exams on Admission: DG Foot Complete Left Result Date: 03/15/2024 EXAM: 3 OR MORE VIEW(S) XRAY OF THE LEFT FOOT 03/15/2024 05:07:00 PM COMPARISON: None available. CLINICAL HISTORY: Infection. FINDINGS: BONES AND JOINTS: No radiographic evidence of osteomyelitis. No acute fracture or dislocation. SOFT TISSUES: Soft tissue ulceration/irregularity about the 1st and 2nd toes. IMPRESSION: 1. Soft tissue ulceration/irregularity about the 1st and 2nd toes without radiographic  evidence of osteomyelitis. Electronically signed by: Norman Gatlin MD 03/15/2024 05:20 PM EST RP Workstation: [id number]   DG Chest 2 View if patient is not in a treatment room. Result Date: 03/15/2024 EXAM: 2 VIEW(S) XRAY OF THE CHEST 03/15/2024 05:07:00 PM COMPARISON: None available. CLINICAL HISTORY: Suspected sepsis. FINDINGS: LUNGS AND PLEURA: No focal pulmonary opacity. No pleural effusion. No pneumothorax. HEART AND MEDIASTINUM: Coronary stents noted. No acute abnormality of the cardiac and mediastinal silhouettes. BONES AND SOFT TISSUES: Vertebroplasty changes in lower thoracic spine. Chronic compression deformities in thoracic spine. IMPRESSION: 1. No acute cardiopulmonary abnormality. Electronically signed by: Norman Gatlin MD 03/15/2024 05:17 PM EST RP Workstation: [id number]   Data Reviewed for HPI: Relevant notes from primary care and specialist visits, past discharge summaries as available in EHR, including Care Everywhere. Prior diagnostic testing as pertinent to current admission diagnoses Updated medications and problem lists for reconciliation ED course, including vitals, labs, imaging, treatment and response to treatment Triage notes, nursing and pharmacy notes and ED provider's notes Notable results as noted above in HPI      Assessment and Plan: * Critical limb ischemia of left lower extremity with rest pain (HCC) Continue heparin   infusion  Nonhealing surgical wound with infection , initial encounter Gangrene toes of left foot PAD s/p bypass 10/2023 with chronic nonhealing surgical wound Patient with increased rest pain Continue heparin  infusion Pain control Vascular consult  Uncontrolled type 2 diabetes mellitus with hyperglycemia (HCC) Sliding scale insulin  coverage  CAD S/P percutaneous coronary angioplasty No complaints of chest pain Continue lisinopril  atorvastatin , aspirin  with nitroglycerin  sublingual as needed  Essential hypertension Continue lisinopri    DVT prophylaxis: heparin   Consults: Vascular, Dr. Tisa  Advance Care Planning:   Code Status: Full Code   Family Communication: none  Disposition Plan: Back to previous home environment  Severity of Illness: The appropriate patient status for this patient is OBSERVATION. Observation status is judged to be reasonable and necessary in order to provide the required intensity of service to ensure the patient's safety. The patient's presenting symptoms, physical exam findings, and initial radiographic and laboratory data in the context of their medical condition is felt to place them at decreased risk for further clinical deterioration. Furthermore, it is anticipated that the patient will be medically stable for discharge from the hospital within 2 midnights of admission.   Author: Delayne LULLA Solian, MD 03/15/2024 10:21 PM  For on call review [email or web address].      [1]  Allergies Allergen Reactions   Bee Venom Anaphylaxis   "

## 2024-03-15 NOTE — ED Provider Notes (Signed)
 "  [facility] Provider Note    Event Date/Time   First MD Initiated Contact with Patient 03/15/24 1742     (approximate)   History   Foot Pain   HPI  Claudia Mills is a 54 y.o. female who presents to the ED for evaluation of Foot Pain   I reviewed vascular surgery clinic visit from 2 days ago.  History of severe PAD and nonhealing left lower extremity wound.  Plans for outpatient revascularization canceled due to weather and patient inability to make the appointment earlier this week.  Patient presents with increasing pain and redness to her left foot over the past 1-2 days.  No systemic symptoms, no trauma.  Does report new redness to the left foot streaking up to her ankle   Physical Exam   Triage Vital Signs: ED Triage Vitals  Encounter Vitals Group     BP 03/15/24 1635 (!) 141/86     Girls Systolic BP Percentile --      Girls Diastolic BP Percentile --      Boys Systolic BP Percentile --      Boys Diastolic BP Percentile --      Pulse Rate 03/15/24 1635 (!) 129     Resp 03/15/24 1635 18     Temp 03/15/24 1635 99.8 F (37.7 C)     Temp Source 03/15/24 1635 Oral     SpO2 03/15/24 1635 100 %     Weight 03/15/24 1634 130 lb (59 kg)     Height 03/15/24 1634 5' 3 (1.6 m)     Head Circumference --      Peak Flow --      Pain Score 03/15/24 1640 6     Pain Loc --      Pain Education --      Exclude from Growth Chart --     Most recent vital signs: Vitals:   03/15/24 1900 03/15/24 1920  BP: (!) 155/76 (!) 155/76  Pulse: (!) 120 (!) 122  Resp:  17  Temp:  99 F (37.2 C)  SpO2: 100% 100%    General: Awake, no distress.  CV:  Good peripheral perfusion.  Resp:  Normal effort.  Abd:  No distention.  MSK:  No deformity noted.  Neuro:  No focal deficits appreciated. Other:  Gangrenous tips of the first 2 toes on the left, reportedly chronic.  Erythematous streaking proximal to this through the midfoot and towards the ankle.  Difficult to  palpate a left DP pulse, delayed capillary refill is present     ED Results / Procedures / Treatments   Labs (all labs ordered are listed, but only abnormal results are displayed) Labs Reviewed  COMPREHENSIVE METABOLIC PANEL WITH GFR - Abnormal; Notable for the following components:      Result Value   Sodium 131 (*)    Chloride 94 (*)    Glucose, Bld 240 (*)    BUN 5 (*)    Total Protein 8.6 (*)    Alkaline Phosphatase 140 (*)    All other components within normal limits  LACTIC ACID, PLASMA - Abnormal; Notable for the following components:   Lactic Acid, Venous 2.0 (*)    All other components within normal limits  CBC WITH DIFFERENTIAL/PLATELET - Abnormal; Notable for the following components:   Hemoglobin 11.5 (*)    HCT 35.3 (*)    RDW 17.4 (*)    All other components within normal limits  CBG MONITORING, ED -  Abnormal; Notable for the following components:   Glucose-Capillary 249 (*)    All other components within normal limits  CULTURE, BLOOD (ROUTINE X 2)  CULTURE, BLOOD (ROUTINE X 2)  LACTIC ACID, PLASMA  PROTIME-INR  URINALYSIS, W/ REFLEX TO CULTURE (INFECTION SUSPECTED)  APTT  HIV ANTIBODY (ROUTINE TESTING W REFLEX)    EKG Sinus tachycardia with a rate of 132 bpm.  Normal axis and intervals.  No acute signs of acute ischemia.  RADIOLOGY Plain film of the left foot interpreted by me without signs of fracture or osteomyelitis CXR interpreted by me without evidence of acute cardiopulmonary pathology.  Official radiology report(s): DG Foot Complete Left Result Date: 03/15/2024 EXAM: 3 OR MORE VIEW(S) XRAY OF THE LEFT FOOT 03/15/2024 05:07:00 PM COMPARISON: None available. CLINICAL HISTORY: Infection. FINDINGS: BONES AND JOINTS: No radiographic evidence of osteomyelitis. No acute fracture or dislocation. SOFT TISSUES: Soft tissue ulceration/irregularity about the 1st and 2nd toes. IMPRESSION: 1. Soft tissue ulceration/irregularity about the 1st and 2nd toes  without radiographic evidence of osteomyelitis. Electronically signed by: Norman Gatlin MD 03/15/2024 05:20 PM EST RP Workstation: [id number]   DG Chest 2 View if patient is not in a treatment room. Result Date: 03/15/2024 EXAM: 2 VIEW(S) XRAY OF THE CHEST 03/15/2024 05:07:00 PM COMPARISON: None available. CLINICAL HISTORY: Suspected sepsis. FINDINGS: LUNGS AND PLEURA: No focal pulmonary opacity. No pleural effusion. No pneumothorax. HEART AND MEDIASTINUM: Coronary stents noted. No acute abnormality of the cardiac and mediastinal silhouettes. BONES AND SOFT TISSUES: Vertebroplasty changes in lower thoracic spine. Chronic compression deformities in thoracic spine. IMPRESSION: 1. No acute cardiopulmonary abnormality. Electronically signed by: Norman Gatlin MD 03/15/2024 05:17 PM EST RP Workstation: [id number]    PROCEDURES and INTERVENTIONS:  .Critical Care  Performed by: Claudene Rover, MD Authorized by: Claudene Rover, MD   Critical care provider statement:    Critical care time (minutes):  30   Critical care time was exclusive of:  Separately billable procedures and treating other patients   Critical care was necessary to treat or prevent imminent or life-threatening deterioration of the following conditions:  Circulatory failure   Critical care was time spent personally by me on the following activities:  Development of treatment plan with patient or surrogate, discussions with consultants, evaluation of patient's response to treatment, examination of patient, ordering and review of laboratory studies, ordering and review of radiographic studies, ordering and performing treatments and interventions, pulse oximetry, re-evaluation of patient's condition and review of old charts .1-3 Lead EKG Interpretation  Performed by: Claudene Rover, MD Authorized by: Claudene Rover, MD     Interpretation: abnormal     ECG rate:  122   ECG rate assessment: tachycardic     Rhythm: sinus tachycardia     Ectopy:  none     Conduction: normal     Medications  vancomycin  (VANCOREADY) IVPB 1250 mg/250 mL (has no administration in time range)  sodium chloride  0.9 % bolus 2,000 mL (has no administration in time range)  heparin  injection 3,550 Units (has no administration in time range)  acetaminophen  (TYLENOL ) tablet 650 mg (has no administration in time range)    Or  acetaminophen  (TYLENOL ) suppository 650 mg (has no administration in time range)  oxyCODONE  (Oxy IR/ROXICODONE ) immediate release tablet 5 mg (has no administration in time range)  HYDROmorphone  (DILAUDID ) injection 0.5-1 mg (has no administration in time range)  ondansetron  (ZOFRAN ) tablet 4 mg (has no administration in time range)    Or  ondansetron  (ZOFRAN ) injection 4 mg (has  no administration in time range)  albuterol  (PROVENTIL ) (2.5 MG/3ML) 0.083% nebulizer solution 2.5 mg (has no administration in time range)  hydrALAZINE  (APRESOLINE ) injection 5 mg (has no administration in time range)  HYDROmorphone  (DILAUDID ) injection 1 mg (1 mg Intravenous Given 03/15/24 1924)  piperacillin -tazobactam (ZOSYN ) IVPB 3.375 g (3.375 g Intravenous New Bag/Given 03/15/24 1924)     IMPRESSION / MDM / ASSESSMENT AND PLAN / ED COURSE  I reviewed the triage vital signs and the nursing notes.  Differential diagnosis includes, but is not limited to, cellulitis, abscess, sepsis, limb ischemia, fracture, osteomyelitis  {Patient presents with symptoms of an acute illness or injury that is potentially life-threatening.  Patient presents with progressive rest pains and signs of critical limb ischemia and acute cellulitis of her left foot requiring IV antibiotics, hepatization and medical admission.  Sinus tachycardia, borderline temperature but stable without signs of shock.  No leukocytosis.  Mild lactic acidosis clears on repeat.  Mild hyperglycemia without signs of DKA.  Start antibiotics, heparin  and consult medicine for admission      FINAL CLINICAL  IMPRESSION(S) / ED DIAGNOSES   Final diagnoses:  Foot pain, left  Critical limb ischemia of left lower extremity (HCC)  Cellulitis of left lower extremity     Rx / DC Orders   ED Discharge Orders     None        Note:  This document was prepared using Dragon voice recognition software and may include unintentional dictation errors.   Claudene Rover, MD 03/15/24 1955  "

## 2024-03-15 NOTE — Assessment & Plan Note (Signed)
 PAD s/p bypass 10/2023 with chronic nonhealing surgical wound Patient with increased rest pain Continue heparin  infusion Pain control Vascular consult

## 2024-03-15 NOTE — Consult Note (Signed)
 Pharmacy Consult Note - Anticoagulation  Pharmacy Consult for heparin  Indication: limb ischemia  PATIENT MEASUREMENTS: Height: 5' 3 (160 cm) Weight: 59 kg (130 lb) IBW/kg (Calculated) : 52.4 HEPARIN  DW (KG): 59  VITAL SIGNS: Temp: 99 F (37.2 C) (01/30 1920) Temp Source: Oral (01/30 1635) BP: 155/76 (01/30 1920) Pulse Rate: 122 (01/30 1920)  Recent Labs    03/15/24 1650  HGB 11.5*  HCT 35.3*  PLT 268  LABPROT 13.3  INR 1.0  CREATININE 0.69    Estimated Creatinine Clearance: 67.3 mL/min (by C-G formula based on SCr of 0.69 mg/dL).  PAST MEDICAL HISTORY: Past Medical History:  Diagnosis Date   Coronary artery disease    Diabetes mellitus without complication (HCC)    Hypertension    MI (myocardial infarction) (HCC)    PVD (peripheral vascular disease)    Tick bite    Pt states she does not remember daye of tic bite, but that is was since last visit with pain clinic and she was hospitalized for 1 day.    ASSESSMENT: 54 y.o. female with PMH including T2DM, HLD, CAD/NSTEMI, PAD is presenting with limb ischemia. Patient is not on chronic anticoagulation per chart review. Pharmacy has been consulted to initiate and manage heparin  intravenous infusion.  Pertinent medications: No chronic anticoagulation PTA per chart review Clopidogrel  75mg  daily Aspirin  81mg  daily  Goal(s) of therapy: Heparin  level 0.3 - 0.7 units/mL Monitor platelets by anticoagulation protocol: Yes   Baseline anticoagulation labs: Recent Labs    03/15/24 1650  INR 1.0  HGB 11.5*  PLT 268  Baseline aPTT also ordered  Date Time aPTT/HL Rate/Comment    PLAN: MD ordered 3550 unit heparin  bolus already Start heparin  infusion at 1000 units/hour. Check heparin  level in 6 hours, then daily once at least two levels are consecutively therapeutic. Monitor CBC daily while on heparin  infusion.  Will M. Lenon, PharmD, BCPS Clinical Pharmacist 03/15/2024 8:11 PM

## 2024-03-15 NOTE — Telephone Encounter (Signed)
 I would definitely proceed to the ER

## 2024-03-15 NOTE — Assessment & Plan Note (Signed)
 Continue heparin infusion

## 2024-03-15 NOTE — Telephone Encounter (Signed)
 Patient called into nurse line with complaint of extreme pain, she reports she has taken 2 pain pills with no relief, was unable to sleep last night is having trouble ambulating and 10/10 pain in toes and her foot. Patient inquired if it warrants a trip to the ED, advised her that would be in her best interest to go. Patient in agreement and will get a bag together and go to the ED. NP advised

## 2024-03-15 NOTE — Progress Notes (Signed)
 Pharmacy Antibiotic Note  Claudia Mills is a 54 y.o. female admitted on 03/15/2024 with wound infection.  Pharmacy has been consulted for Vanc, Zosyn  dosing.  Plan: Zosyn  3.375 gm IV X 1 over 30 min given in ED on 1/30 @ 1924. Zosyn  3.375 gm IV Q8H EI ordered to start on 1/31 @ 0100.  Vancomycin  1250 mg IV X 1 given in ED on 1/30 @ 2122. Vancomycin  1250 mg IV Q24H ordered to start on 1/31 @ 2100.  AUC = 488.5 Vanc trough = 9.9   Height: 5' 3 (160 cm) Weight: 59 kg (130 lb) IBW/kg (Calculated) : 52.4  Temp (24hrs), Avg:99.4 F (37.4 C), Min:99 F (37.2 C), Max:99.8 F (37.7 C)  Recent Labs  Lab 03/15/24 1650 03/15/24 1842  WBC 6.7  --   CREATININE 0.69  --   LATICACIDVEN 2.0* 1.3    Estimated Creatinine Clearance: 67.3 mL/min (by C-G formula based on SCr of 0.69 mg/dL).    Allergies[1]  Antimicrobials this admission:   >>    >>   Dose adjustments this admission:   Microbiology results:  BCx:   UCx:    Sputum:   MRSA PCR:   Thank you for allowing pharmacy to be a part of this patients care.  Stelios Kirby D 03/15/2024 11:11 PM     [1]  Allergies Allergen Reactions   Bee Venom Anaphylaxis

## 2024-03-15 NOTE — Assessment & Plan Note (Signed)
 Sliding scale insulin  coverage

## 2024-03-16 DIAGNOSIS — I70222 Atherosclerosis of native arteries of extremities with rest pain, left leg: Secondary | ICD-10-CM | POA: Diagnosis not present

## 2024-03-16 LAB — CBC
HCT: 28.2 % — ABNORMAL LOW (ref 36.0–46.0)
Hemoglobin: 9 g/dL — ABNORMAL LOW (ref 12.0–15.0)
MCH: 29.6 pg (ref 26.0–34.0)
MCHC: 31.9 g/dL (ref 30.0–36.0)
MCV: 92.8 fL (ref 80.0–100.0)
Platelets: 214 10*3/uL (ref 150–400)
RBC: 3.04 MIL/uL — ABNORMAL LOW (ref 3.87–5.11)
RDW: 17.4 % — ABNORMAL HIGH (ref 11.5–15.5)
WBC: 4.4 10*3/uL (ref 4.0–10.5)
nRBC: 0 % (ref 0.0–0.2)

## 2024-03-16 LAB — URINALYSIS, W/ REFLEX TO CULTURE (INFECTION SUSPECTED)
Bacteria, UA: NONE SEEN
Bilirubin Urine: NEGATIVE
Glucose, UA: 50 mg/dL — AB
Ketones, ur: NEGATIVE mg/dL
Nitrite: NEGATIVE
Protein, ur: NEGATIVE mg/dL
Specific Gravity, Urine: 1.012 (ref 1.005–1.030)
pH: 5 (ref 5.0–8.0)

## 2024-03-16 LAB — HEPARIN LEVEL (UNFRACTIONATED)
Heparin Unfractionated: 0.44 [IU]/mL (ref 0.30–0.70)
Heparin Unfractionated: 0.49 [IU]/mL (ref 0.30–0.70)

## 2024-03-16 LAB — CBG MONITORING, ED: Glucose-Capillary: 86 mg/dL (ref 70–99)

## 2024-03-16 NOTE — Progress Notes (Signed)
 " PROGRESS NOTE Claudia Mills    DOB: 20-Jan-1971, 54 y.o.  FMW:[id number]    Code Status: Full Code   DOA: 03/15/2024   LOS: 1  Brief hospital course  Claudia Mills is a 54 y.o. female with a PMH significant for CAD with history of NSTEMI s/p stent mid LAD, HTN, DM with neuropathy, PAD with prior revascularization 10/2023, currently with dry gangrene of toes left foot, ongoing rest pain,chronic nonhealing surgical wound due to suspected narrowing of prior bypass,  being admitted with suspected cellulitis due to wound infection as well as limb ischemia.  ED course: temp 99.8 and tachycardic to the 120s with BP 141/86. Labs mostly unremarkable with normal WBC and lactic acid and mild hyperglycemia of 240  EKG: sinus tachycardia at 132 Chest x-ray nonacute Left foot x-ray showing soft tissue ulceration about the 1st and 2nd toes without evidence of osteomyelitis  03/16/24 -vascular surgery consulted- intervention planned tentatively for this week.   Assessment & Plan  Principal Problem:   Critical limb ischemia of left lower extremity with rest pain [facility]) Active Problems:   Nonhealing surgical wound with infection , initial encounter   Other postprocedural wound infection   Uncontrolled type 2 diabetes mellitus with hyperglycemia (HCC)   CAD S/P percutaneous coronary angioplasty   Essential hypertension  Critical limb ischemia of left lower extremity with rest pain (HCC) Continue heparin  infusion Vascular surgery tentatively planning an intervention this week - analgesia PRN   Nonhealing surgical wound with infection , initial encounter Gangrene toes of left foot PAD s/p bypass 10/2023 with chronic nonhealing surgical wound Pain improved today Continue heparin  infusion Pain control Vascular consult   Uncontrolled type 2 diabetes mellitus with hyperglycemia (HCC) Sliding scale insulin  coverage   CAD S/P percutaneous coronary angioplasty No complaints of chest pain Continue  lisinopril  atorvastatin , aspirin  with nitroglycerin  sublingual as needed   Essential hypertension Continue lisinopril   Body mass index is 23.03 kg/m.  VTE ppx: heparin  gtt  Diet:     Diet   Diet NPO time specified Except for: Sips with Meds, Ice Chips   Consultants: Vascular surgery   Subjective 03/16/24    Pt reports feeling improved today. Pain is well controlled currently.    Objective  Blood pressure 131/83, pulse 96, temperature 97.8 F (36.6 C), temperature source Oral, resp. rate 16, height 5' 3 (1.6 m), weight 59 kg, last menstrual period 09/29/2015, SpO2 99%.  Intake/Output Summary (Last 24 hours) at 03/16/2024 0802 Last data filed at 03/16/2024 0244 Gross per 24 hour  Intake 50 ml  Output 1000 ml  Net -950 ml   Filed Weights   03/15/24 1634  Weight: 59 kg    Physical Exam:  General: awake, alert, NAD HEENT: atraumatic, clear conjunctiva, anicteric sclera, MMM, hearing grossly normal Respiratory: normal respiratory effort. Cardiovascular: extremities well perfused, quick capillary refill, normal S1/S2, RRR, no JVD, murmurs Nervous: A&O x3. no gross focal neurologic deficits, normal speech Extremities: LE wounds in clean dry dressing. Black first and second toe on left foot.  Psychiatry: normal mood, congruent affect  Labs   I have personally reviewed the following labs and imaging studies CBC    Component Value Date/Time   WBC 4.4 03/16/2024 0253   RBC 3.04 (L) 03/16/2024 0253   HGB 9.0 (L) 03/16/2024 0253   HCT 28.2 (L) 03/16/2024 0253   PLT 214 03/16/2024 0253   MCV 92.8 03/16/2024 0253   MCH 29.6 03/16/2024 0253   MCHC 31.9 03/16/2024 0253  RDW 17.4 (H) 03/16/2024 0253   LYMPHSABS 0.7 03/15/2024 1650   MONOABS 0.3 03/15/2024 1650   EOSABS 0.1 03/15/2024 1650   BASOSABS 0.0 03/15/2024 1650      Latest Ref Rng & Units 03/15/2024    4:50 PM 11/30/2023    4:56 PM 11/23/2023    5:16 AM  BMP  Glucose 70 - 99 mg/dL 759  846    BUN 6 - 20  mg/dL 5  6    Creatinine 9.55 - 1.00 mg/dL 9.30  9.27  9.54   Sodium 135 - 145 mmol/L 131  138    Potassium 3.5 - 5.1 mmol/L 3.9  3.7    Chloride 98 - 111 mmol/L 94  103    CO2 22 - 32 mmol/L 24  21    Calcium  8.9 - 10.3 mg/dL 89.9  8.8      DG Foot Complete Left Result Date: 03/15/2024 EXAM: 3 OR MORE VIEW(S) XRAY OF THE LEFT FOOT 03/15/2024 05:07:00 PM COMPARISON: None available. CLINICAL HISTORY: Infection. FINDINGS: BONES AND JOINTS: No radiographic evidence of osteomyelitis. No acute fracture or dislocation. SOFT TISSUES: Soft tissue ulceration/irregularity about the 1st and 2nd toes. IMPRESSION: 1. Soft tissue ulceration/irregularity about the 1st and 2nd toes without radiographic evidence of osteomyelitis. Electronically signed by: Norman Gatlin MD 03/15/2024 05:20 PM EST RP Workstation: [id number]   DG Chest 2 View if patient is not in a treatment room. Result Date: 03/15/2024 EXAM: 2 VIEW(S) XRAY OF THE CHEST 03/15/2024 05:07:00 PM COMPARISON: None available. CLINICAL HISTORY: Suspected sepsis. FINDINGS: LUNGS AND PLEURA: No focal pulmonary opacity. No pleural effusion. No pneumothorax. HEART AND MEDIASTINUM: Coronary stents noted. No acute abnormality of the cardiac and mediastinal silhouettes. BONES AND SOFT TISSUES: Vertebroplasty changes in lower thoracic spine. Chronic compression deformities in thoracic spine. IMPRESSION: 1. No acute cardiopulmonary abnormality. Electronically signed by: Norman Gatlin MD 03/15/2024 05:17 PM EST RP Workstation: [id number]   Disposition Plan & Communication  Patient status: Inpatient  Admitted From: Home Planned disposition location: Home Anticipated discharge date: next week pending vascular management   Family Communication: none at bedside     Author: Marien LITTIE Piety, DO [facility] 03/16/2024, 8:02 AM   Available by Epic secure chat 7AM-7PM. If 7PM-7AM, please contact night-coverage.  [facility] contact information found on  [email or web address].  "

## 2024-03-16 NOTE — Consult Note (Signed)
 Pharmacy Consult Note - Anticoagulation  Pharmacy Consult for heparin  Indication: limb ischemia  PATIENT MEASUREMENTS: Height: 5' 3 (160 cm) Weight: 59 kg (130 lb) IBW/kg (Calculated) : 52.4 HEPARIN  DW (KG): 59  VITAL SIGNS: Temp: 97.7 F (36.5 C) (01/31 0025) Temp Source: Oral (01/31 0025) BP: 133/60 (01/31 0025) Pulse Rate: 105 (01/31 0025)  Recent Labs    03/15/24 1650 03/15/24 2000 03/16/24 0253 03/16/24 0333  HGB 11.5*  --  9.0*  --   HCT 35.3*  --  28.2*  --   PLT 268  --  214  --   APTT  --  35  --   --   LABPROT 13.3  --   --   --   INR 1.0  --   --   --   HEPARINUNFRC  --   --   --  0.44  CREATININE 0.69  --   --   --     Estimated Creatinine Clearance: 67.3 mL/min (by C-G formula based on SCr of 0.69 mg/dL).  PAST MEDICAL HISTORY: Past Medical History:  Diagnosis Date   Coronary artery disease    Diabetes mellitus without complication (HCC)    Hypertension    MI (myocardial infarction) (HCC)    PVD (peripheral vascular disease)    Tick bite    Pt states she does not remember daye of tic bite, but that is was since last visit with pain clinic and she was hospitalized for 1 day.    ASSESSMENT: 54 y.o. female with PMH including T2DM, HLD, CAD/NSTEMI, PAD is presenting with limb ischemia. Patient is not on chronic anticoagulation per chart review. Pharmacy has been consulted to initiate and manage heparin  intravenous infusion.  Pertinent medications: No chronic anticoagulation PTA per chart review Clopidogrel  75mg  daily Aspirin  81mg  daily  Goal(s) of therapy: Heparin  level 0.3 - 0.7 units/mL Monitor platelets by anticoagulation protocol: Yes   Baseline anticoagulation labs: Recent Labs    03/15/24 1650 03/15/24 2000 03/16/24 0253  APTT  --  35  --   INR 1.0  --   --   HGB 11.5*  --  9.0*  PLT 268  --  214  Baseline aPTT also ordered  Date Time aPTT/HL Rate/Comment  1/31     0333      0.44              1000 units/hr - therapeutic X  1  PLAN: 1/31:  HL @ 0333 = 0.44, therapeutic X 1 - Will continue pt on current rate and recheck HL in 6 hrs Monitor CBC daily while on heparin  infusion.  Donnita Farina D Clinical Pharmacist 03/16/2024 4:12 AM

## 2024-03-16 NOTE — Consult Note (Signed)
 Pharmacy Consult Note - Anticoagulation  Pharmacy Consult for heparin  Indication: limb ischemia  PATIENT MEASUREMENTS: Height: 5' 3 (160 cm) Weight: 59 kg (130 lb) IBW/kg (Calculated) : 52.4 HEPARIN  DW (KG): 59  VITAL SIGNS: Temp: 97.8 F (36.6 C) (01/31 0658) Temp Source: Oral (01/31 0658) BP: 137/66 (01/31 1000) Pulse Rate: 98 (01/31 1000)  Recent Labs    03/15/24 1650 03/15/24 2000 03/16/24 0253 03/16/24 0333 03/16/24 0949  HGB 11.5*  --  9.0*  --   --   HCT 35.3*  --  28.2*  --   --   PLT 268  --  214  --   --   APTT  --  35  --   --   --   LABPROT 13.3  --   --   --   --   INR 1.0  --   --   --   --   HEPARINUNFRC  --   --   --    < > 0.49  CREATININE 0.69  --   --   --   --    < > = values in this interval not displayed.    Estimated Creatinine Clearance: 67.3 mL/min (by C-G formula based on SCr of 0.69 mg/dL).  PAST MEDICAL HISTORY: Past Medical History:  Diagnosis Date   Coronary artery disease    Diabetes mellitus without complication (HCC)    Hypertension    MI (myocardial infarction) (HCC)    PVD (peripheral vascular disease)    Tick bite    Pt states she does not remember daye of tic bite, but that is was since last visit with pain clinic and she was hospitalized for 1 day.    ASSESSMENT: 54 y.o. female with PMH including T2DM, HLD, CAD/NSTEMI, PAD is presenting with limb ischemia. Patient is not on chronic anticoagulation per chart review. Pharmacy has been consulted to initiate and manage heparin  intravenous infusion.  Pertinent medications: No chronic anticoagulation PTA per chart review Clopidogrel  75mg  daily Aspirin  81mg  daily  Goal(s) of therapy: Heparin  level 0.3 - 0.7 units/mL Monitor platelets by anticoagulation protocol: Yes   Baseline anticoagulation labs: Recent Labs    03/15/24 1650 03/15/24 2000 03/16/24 0253  APTT  --  35  --   INR 1.0  --   --   HGB 11.5*  --  9.0*  PLT 268  --  214  Baseline aPTT also  ordered   PLAN: heparin  level therapeutic x 2 - continue heparin  infusion at 1000 units/hr - recheck next heparin  level in am 02/01 - Monitor CBC daily while on heparin  infusion.  Adriana JONETTA Bolster Clinical Pharmacist 03/16/2024 12:36 PM

## 2024-03-17 DIAGNOSIS — Z9861 Coronary angioplasty status: Secondary | ICD-10-CM | POA: Diagnosis not present

## 2024-03-17 DIAGNOSIS — T8189XA Other complications of procedures, not elsewhere classified, initial encounter: Secondary | ICD-10-CM

## 2024-03-17 DIAGNOSIS — I10 Essential (primary) hypertension: Secondary | ICD-10-CM

## 2024-03-17 DIAGNOSIS — I2510 Atherosclerotic heart disease of native coronary artery without angina pectoris: Secondary | ICD-10-CM | POA: Diagnosis not present

## 2024-03-17 DIAGNOSIS — E1165 Type 2 diabetes mellitus with hyperglycemia: Secondary | ICD-10-CM | POA: Diagnosis not present

## 2024-03-17 DIAGNOSIS — I70222 Atherosclerosis of native arteries of extremities with rest pain, left leg: Secondary | ICD-10-CM | POA: Diagnosis not present

## 2024-03-17 DIAGNOSIS — I96 Gangrene, not elsewhere classified: Secondary | ICD-10-CM | POA: Diagnosis present

## 2024-03-17 LAB — CBC
HCT: 29.9 % — ABNORMAL LOW (ref 36.0–46.0)
Hemoglobin: 9.4 g/dL — ABNORMAL LOW (ref 12.0–15.0)
MCH: 29.7 pg (ref 26.0–34.0)
MCHC: 31.4 g/dL (ref 30.0–36.0)
MCV: 94.6 fL (ref 80.0–100.0)
Platelets: 197 10*3/uL (ref 150–400)
RBC: 3.16 MIL/uL — ABNORMAL LOW (ref 3.87–5.11)
RDW: 17 % — ABNORMAL HIGH (ref 11.5–15.5)
WBC: 5.4 10*3/uL (ref 4.0–10.5)
nRBC: 0 % (ref 0.0–0.2)

## 2024-03-17 LAB — HEPARIN LEVEL (UNFRACTIONATED): Heparin Unfractionated: 0.68 [IU]/mL (ref 0.30–0.70)

## 2024-03-17 LAB — TYPE AND SCREEN
ABO/RH(D): O POS
Antibody Screen: NEGATIVE

## 2024-03-17 NOTE — Progress Notes (Signed)
"   ° °  Subjective/Chief Complaint: Pain in LEFT foot/leg slightly better. Anxious about toes   Objective: Vital signs in last 24 hours: Temp:  [97.9 F (36.6 C)-98.7 F (37.1 C)] 97.9 F (36.6 C) (02/01 0814) Pulse Rate:  [89-102] 93 (02/01 0700) Resp:  [14-26] 15 (02/01 0700) BP: (129-147)/(57-78) 142/74 (02/01 0947) SpO2:  [98 %-100 %] 100 % (02/01 0700)    Intake/Output from previous day: No intake/output data recorded. Intake/Output this shift: No intake/output data recorded.  General appearance: alert and no distress Cardio: regular rate and rhythm Extremities: LEFT lower extremity- warm to foot, increased erythema of foot, stable gangrene of toes, +motor/sensory  Lab Results:  Recent Labs    03/16/24 0253 03/17/24 0429  WBC 4.4 5.4  HGB 9.0* 9.4*  HCT 28.2* 29.9*  PLT 214 197   BMET Recent Labs    03/15/24 1650  NA 131*  K 3.9  CL 94*  CO2 24  GLUCOSE 240*  BUN 5*  CREATININE 0.69  CALCIUM  10.0   PT/INR Recent Labs    03/15/24 1650  LABPROT 13.3  INR 1.0   ABG No results for input(s): PHART, HCO3 in the last 72 hours.  Invalid input(s): PCO2, PO2  Studies/Results: DG Foot Complete Left Result Date: 03/15/2024 EXAM: 3 OR MORE VIEW(S) XRAY OF THE LEFT FOOT 03/15/2024 05:07:00 PM COMPARISON: None available. CLINICAL HISTORY: Infection. FINDINGS: BONES AND JOINTS: No radiographic evidence of osteomyelitis. No acute fracture or dislocation. SOFT TISSUES: Soft tissue ulceration/irregularity about the 1st and 2nd toes. IMPRESSION: 1. Soft tissue ulceration/irregularity about the 1st and 2nd toes without radiographic evidence of osteomyelitis. Electronically signed by: Norman Gatlin MD 03/15/2024 05:20 PM EST RP Workstation: [id number]   DG Chest 2 View if patient is not in a treatment room. Result Date: 03/15/2024 EXAM: 2 VIEW(S) XRAY OF THE CHEST 03/15/2024 05:07:00 PM COMPARISON: None available. CLINICAL HISTORY: Suspected sepsis. FINDINGS:  LUNGS AND PLEURA: No focal pulmonary opacity. No pleural effusion. No pneumothorax. HEART AND MEDIASTINUM: Coronary stents noted. No acute abnormality of the cardiac and mediastinal silhouettes. BONES AND SOFT TISSUES: Vertebroplasty changes in lower thoracic spine. Chronic compression deformities in thoracic spine. IMPRESSION: 1. No acute cardiopulmonary abnormality. Electronically signed by: Norman Gatlin MD 03/15/2024 05:17 PM EST RP Workstation: [id number]    Anti-infectives: Anti-infectives (From admission, onward)    Start     Dose/Rate Route Frequency Ordered Stop   03/16/24 2100  vancomycin  (VANCOREADY) IVPB 1250 mg/250 mL        1,250 mg 166.7 mL/hr over 90 Minutes Intravenous Every 24 hours 03/15/24 2310     03/16/24 0100  piperacillin -tazobactam (ZOSYN ) IVPB 3.375 g        3.375 g 12.5 mL/hr over 240 Minutes Intravenous Every 8 hours 03/15/24 2308     03/15/24 2000  vancomycin  (VANCOREADY) IVPB 1250 mg/250 mL        1,250 mg 166.7 mL/hr over 90 Minutes Intravenous  Once 03/15/24 1917 03/15/24 2252   03/15/24 1930  piperacillin -tazobactam (ZOSYN ) IVPB 3.375 g        3.375 g 100 mL/hr over 30 Minutes Intravenous  Once 03/15/24 1917 03/15/24 1954       Assessment/Plan: Ischemic rest pain LEFT, non healing surgical wounds s/p multiple lower extremity revascularizations Right lower extremity chronic ischemia  Continue Heparin  gtt Intervention early next week per Dr. Jama Discussed foot gangrene with Dr. Lennie- Podiatry Pain control EKG  LOS: 2 days    Tisa Dakin A 03/17/2024  "

## 2024-03-17 NOTE — Consult Note (Signed)
 PODIATRY / FOOT AND ANKLE SURGERY CONSULTATION NOTE  Requesting Physician: Dr. Tisa  Reason for consult: Gangrene toes left foot  Chief Complaint: Left foot pain/gangrene   HPI: Claudia Mills is a 54 y.o. female who presents with gangrenous changes to the left foot involving 1st and 2nd toes.  Patient notes that these gangrenous changes have been present since the fall.  Patient has underwent a few revascularization procedures but continues to have problems with circulation of the left lower extremity.  Vascular team is planning revascularization procedure this Tuesday.  Could consider possible amputations of 1st and 2nd toes after that time.  Patient currently complains of left leg and foot pain.  Displays no symptoms on the right side though at this time.  PMHx:  Past Medical History:  Diagnosis Date   Coronary artery disease    Diabetes mellitus without complication (HCC)    Hypertension    MI (myocardial infarction) (HCC)    PVD (peripheral vascular disease)    Tick bite    Pt states she does not remember daye of tic bite, but that is was since last visit with pain clinic and she was hospitalized for 1 day.    Surgical Hx:  Past Surgical History:  Procedure Laterality Date   APPLICATION OF CELL SAVER Left 11/09/2023   Procedure: APPLICATION OF CELL SAVER;  Surgeon: Jama Cordella MATSU, MD;  Location: [facility];  Service: Vascular;  Laterality: Left;   APPLICATION OF CELL SAVER N/A 11/16/2023   Procedure: APPLICATION OF CELL SAVER;  Surgeon: Jama Cordella MATSU, MD;  Location: [facility];  Service: Vascular;  Laterality: N/A;   BYPASS GRAFT POPLITEAL TO POPLITEAL  11/16/2023   Procedure: CREATION, BYPASS, ARTERIAL, POPLITEAL;  Surgeon: Jama Cordella MATSU, MD;  Location: [facility];  Service: Vascular;;   BYPASS GRAFT POPLITEAL TO TIBIAL  11/16/2023   Procedure: BYPASS GRAFT POPLITEAL TO TIBIAL (CATHLAB);  Surgeon: Jama Cordella MATSU, MD;  Location: [facility];  Service: Vascular;;   CORONARY  ANGIOPLASTY WITH STENT PLACEMENT     ENDARTERECTOMY FEMORAL Left 11/09/2023   Procedure: ENDARTERECTOMY, FEMORAL;  Surgeon: Jama Cordella MATSU, MD;  Location: [facility];  Service: Vascular;  Laterality: Left;   ENDARTERECTOMY FEMORAL  11/16/2023   Procedure: ENDARTERECTOMY, FEMORAL;  Surgeon: Jama Cordella MATSU, MD;  Location: [facility];  Service: Vascular;;   ENDARTERECTOMY POPLITEAL  11/16/2023   Procedure: ENDARTERECTOMY, POPLITEAL;  Surgeon: Jama Cordella MATSU, MD;  Location: [facility];  Service: Vascular;;   ENDARTERECTOMY TIBIOPERONEAL  11/16/2023   Procedure: ENDARTERECTOMY TIBIOPERONEAL (CATHLAB);  Surgeon: Jama Cordella MATSU, MD;  Location: [facility];  Service: Vascular;;   IR KYPHO LUMBAR INC FX REDUCE BONE BX UNI/BIL CANNULATION INC/IMAGING  10/06/2022   IR RADIOLOGIST EVAL & MGMT  09/20/2022   IR RADIOLOGIST EVAL & MGMT  10/13/2022   IR RADIOLOGIST EVAL & MGMT  10/20/2022   LOWER EXTREMITY ANGIOGRAPHY Left 08/08/2023   Procedure: Lower Extremity Angiography;  Surgeon: Jama Cordella MATSU, MD;  Location: [facility];  Service: Cardiovascular;  Laterality: Left;   LOWER EXTREMITY ANGIOGRAPHY Left 11/08/2023   Procedure: Lower Extremity Angiography;  Surgeon: Jama Cordella MATSU, MD;  Location: [facility];  Service: Cardiovascular;  Laterality: Left;   LOWER EXTREMITY ANGIOGRAPHY Left 11/15/2023   Procedure: Lower Extremity Angiography;  Surgeon: Jama Cordella MATSU, MD;  Location: [facility];  Service: Cardiovascular;  Laterality: Left;   LOWER EXTREMITY INTERVENTION Left 08/08/2023   Procedure: LOWER EXTREMITY INTERVENTION;  Surgeon: Jama,  Cordella MATSU, MD;  Location: [facility];  Service: Cardiovascular;  Laterality: Left;   THROMBECTOMY FEMORAL ARTERY Left 11/09/2023   Procedure: THROMBECTOMY, ARTERY, FEMORAL and Popliteal;  Surgeon: Jama Cordella MATSU, MD;  Location: [facility];  Service: Vascular;  Laterality: Left;    FHx: No family history on file.  Social  History:  reports that she has never smoked. She has never used smokeless tobacco. She reports current alcohol use of about 12.0 standard drinks of alcohol per week. She reports current drug use. Drug: Marijuana.  Allergies: Allergies[1]  (Not in a hospital admission)   Physical Exam: General: Alert and oriented.  No apparent distress.  Vascular: DP/PT pulses nonpalpable bilateral, capillary fill time intact to all digits except for 1st and 2nd toes of the left foot which are necrotic.  Left leg appears to have mild edema and slight erythematous hue compared to right side.  Neuro: Light touch sensation slightly reduced to bilateral lower extremities.  Derm: Gangrenous changes present to the 1st and 2nd toes left foot which appear to be dry    MSK: Pain on palpation of the left foot and ankle.  Results for orders placed or performed during the hospital encounter of 03/15/24 (from the past 48 hours)  CBG monitoring, ED     Status: Abnormal   Collection Time: 03/15/24  4:42 PM  Result Value Ref Range   Glucose-Capillary 249 (H) 70 - 99 mg/dL    Comment: Glucose reference range applies only to samples taken after fasting for at least 8 hours.  Comprehensive metabolic panel     Status: Abnormal   Collection Time: 03/15/24  4:50 PM  Result Value Ref Range   Sodium 131 (L) 135 - 145 mmol/L   Potassium 3.9 3.5 - 5.1 mmol/L   Chloride 94 (L) 98 - 111 mmol/L   CO2 24 22 - 32 mmol/L   Glucose, Bld 240 (H) 70 - 99 mg/dL    Comment: Glucose reference range applies only to samples taken after fasting for at least 8 hours.   BUN 5 (L) 6 - 20 mg/dL   Creatinine, Ser 9.30 0.44 - 1.00 mg/dL   Calcium  10.0 8.9 - 10.3 mg/dL   Total Protein 8.6 (H) 6.5 - 8.1 g/dL   Albumin 4.8 3.5 - 5.0 g/dL   AST 28 15 - 41 U/L   ALT 25 0 - 44 U/L   Alkaline Phosphatase 140 (H) 38 - 126 U/L   Total Bilirubin 0.3 0.0 - 1.2 mg/dL   GFR, Estimated >39 >39 mL/min    Comment: (NOTE) Calculated using the CKD-EPI  Creatinine Equation (2021)    Anion gap 13 5 - 15    Comment: Performed at [facility], [address] [place], [place] [address]  Lactic acid, plasma     Status: Abnormal   Collection Time: 03/15/24  4:50 PM  Result Value Ref Range   Lactic Acid, Venous 2.0 (HH) 0.5 - 1.9 mmol/L    Comment: Critical Value, Read Back and verified with REINA GALJOUR @1736  03/15/24 MJU Performed at [facility], [address] [place], [place] [address]   CBC with Differential     Status: Abnormal   Collection Time: 03/15/24  4:50 PM  Result Value Ref Range   WBC 6.7 4.0 - 10.5 K/uL   RBC 3.88 3.87 - 5.11 MIL/uL   Hemoglobin 11.5 (L) 12.0 - 15.0 g/dL   HCT 64.6 (L) 63.9 - 53.9 %   MCV  91.0 80.0 - 100.0 fL   MCH 29.6 26.0 - 34.0 pg   MCHC 32.6 30.0 - 36.0 g/dL   RDW 82.5 (H) 88.4 - 84.4 %   Platelets 268 150 - 400 K/uL   nRBC 0.0 0.0 - 0.2 %   Neutrophils Relative % 82 %   Neutro Abs 5.5 1.7 - 7.7 K/uL   Lymphocytes Relative 11 %   Lymphs Abs 0.7 0.7 - 4.0 K/uL   Monocytes Relative 5 %   Monocytes Absolute 0.3 0.1 - 1.0 K/uL   Eosinophils Relative 1 %   Eosinophils Absolute 0.1 0.0 - 0.5 K/uL   Basophils Relative 1 %   Basophils Absolute 0.0 0.0 - 0.1 K/uL   Immature Granulocytes 0 %   Abs Immature Granulocytes 0.02 0.00 - 0.07 K/uL    Comment: Performed at [facility], [address] [place], [place] [address]  Protime-INR     Status: None   Collection Time: 03/15/24  4:50 PM  Result Value Ref Range   Prothrombin Time 13.3 11.4 - 15.2 seconds   INR 1.0 0.8 - 1.2    Comment: (NOTE) INR goal varies based on device and disease states. Performed at [facility], [address] [place], [place] [address]   Culture, blood (Routine x 2)     Status: None (Preliminary result)   Collection Time: 03/15/24  4:50 PM   Specimen: BLOOD  Result Value Ref Range   Specimen Description BLOOD BLOOD LEFT ARM    Special Requests      BOTTLES DRAWN AEROBIC AND  ANAEROBIC Blood Culture adequate volume   Culture      NO GROWTH 2 DAYS Performed at [facility], [address], [place], [place] [address]    Report Status PENDING   Culture, blood (Routine x 2)     Status: None (Preliminary result)   Collection Time: 03/15/24  4:51 PM   Specimen: BLOOD  Result Value Ref Range   Specimen Description BLOOD LEFT ANTECUBITAL    Special Requests      BOTTLES DRAWN AEROBIC AND ANAEROBIC Blood Culture results may not be optimal due to an inadequate volume of blood received in culture bottles   Culture      NO GROWTH 2 DAYS Performed at [facility], [address] [place], [place] [address]    Report Status PENDING   Lactic acid, plasma     Status: None   Collection Time: 03/15/24  6:42 PM  Result Value Ref Range   Lactic Acid, Venous 1.3 0.5 - 1.9 mmol/L    Comment: Performed at [facility], [address] [place], [place] [address]  APTT     Status: None   Collection Time: 03/15/24  8:00 PM  Result Value Ref Range   aPTT 35 24 - 36 seconds    Comment: Performed at [facility], [address] [place], [place] [address]  CBC     Status: Abnormal   Collection Time: 03/16/24  2:53 AM  Result Value Ref Range   WBC 4.4 4.0 - 10.5 K/uL   RBC 3.04 (L) 3.87 - 5.11 MIL/uL   Hemoglobin 9.0 (L) 12.0 - 15.0 g/dL   HCT 71.7 (L) 63.9 - 53.9 %   MCV 92.8 80.0 - 100.0 fL   MCH 29.6 26.0 - 34.0 pg   MCHC 31.9 30.0 - 36.0 g/dL   RDW 82.5 (H) 88.4 - 84.4 %   Platelets 214 150 - 400  K/uL   nRBC 0.0 0.0 - 0.2 %    Comment: Performed at [facility], [address] [place], [place] [address]  Heparin  level (unfractionated)     Status: None   Collection Time: 03/16/24  3:33 AM  Result Value Ref Range   Heparin  Unfractionated 0.44 0.30 - 0.70 IU/mL    Comment: (NOTE) The clinical reportable range upper limit is being lowered to >1.10 to align with the FDA approved guidance for the current  laboratory assay.  If heparin  results are below expected values, and patient dosage has  been confirmed, suggest follow up testing of antithrombin III levels. Performed at [facility], [address] [place], [place] [address]   Heparin  level (unfractionated)     Status: None   Collection Time: 03/16/24  9:49 AM  Result Value Ref Range   Heparin  Unfractionated 0.49 0.30 - 0.70 IU/mL    Comment: (NOTE) The clinical reportable range upper limit is being lowered to >1.10 to align with the FDA approved guidance for the current laboratory assay.  If heparin  results are below expected values, and patient dosage has  been confirmed, suggest follow up testing of antithrombin III levels. Performed at [facility], [address] [place], [place] [address]   CBG monitoring, ED     Status: None   Collection Time: 03/16/24 12:01 PM  Result Value Ref Range   Glucose-Capillary 86 70 - 99 mg/dL    Comment: Glucose reference range applies only to samples taken after fasting for at least 8 hours.  Urinalysis, w/ Reflex to Culture (Infection Suspected) -Urine, Clean Catch     Status: Abnormal   Collection Time: 03/16/24  1:20 PM  Result Value Ref Range   Specimen Source URINE, CATHETERIZED    Color, Urine STRAW (A) YELLOW   APPearance CLEAR (A) CLEAR   Specific Gravity, Urine 1.012 1.005 - 1.030   pH 5.0 5.0 - 8.0   Glucose, UA 50 (A) NEGATIVE mg/dL   Hgb urine dipstick SMALL (A) NEGATIVE   Bilirubin Urine NEGATIVE NEGATIVE   Ketones, ur NEGATIVE NEGATIVE mg/dL   Protein, ur NEGATIVE NEGATIVE mg/dL   Nitrite NEGATIVE NEGATIVE   Leukocytes,Ua LARGE (A) NEGATIVE   RBC / HPF 0-5 0 - 5 RBC/hpf   WBC, UA 21-50 0 - 5 WBC/hpf    Comment:        Reflex urine culture not performed if WBC <=10, OR if Squamous epithelial cells >5. If Squamous epithelial cells >5 suggest recollection.    Bacteria, UA NONE SEEN NONE SEEN   Squamous Epithelial / HPF 0-5 0 - 5 /HPF   Mucus  PRESENT     Comment: Performed at [facility], [address] [place], [place] [address]  Heparin  level (unfractionated)     Status: None   Collection Time: 03/17/24  4:29 AM  Result Value Ref Range   Heparin  Unfractionated 0.68 0.30 - 0.70 IU/mL    Comment: (NOTE) The clinical reportable range upper limit is being lowered to >1.10 to align with the FDA approved guidance for the current laboratory assay.  If heparin  results are below expected values, and patient dosage has  been confirmed, suggest follow up testing of antithrombin III levels. Performed at [facility], [address] [place], [place] [address]   CBC     Status: Abnormal   Collection Time: 03/17/24  4:29 AM  Result Value Ref Range   WBC 5.4 4.0 - 10.5 K/uL  RBC 3.16 (L) 3.87 - 5.11 MIL/uL   Hemoglobin 9.4 (L) 12.0 - 15.0 g/dL   HCT 70.0 (L) 63.9 - 53.9 %   MCV 94.6 80.0 - 100.0 fL   MCH 29.7 26.0 - 34.0 pg   MCHC 31.4 30.0 - 36.0 g/dL   RDW 82.9 (H) 88.4 - 84.4 %   Platelets 197 150 - 400 K/uL   nRBC 0.0 0.0 - 0.2 %    Comment: Performed at [facility], [address] [place], [place] [address]  Type and screen [facility]     Status: None   Collection Time: 03/17/24 10:13 AM  Result Value Ref Range   ABO/RH(D) O POS    Antibody Screen NEG    Sample Expiration      03/20/2024,2359 Performed at [facility], [address] [place], [place] [address]    DG Foot Complete Left Result Date: 03/15/2024 EXAM: 3 OR MORE VIEW(S) XRAY OF THE LEFT FOOT 03/15/2024 05:07:00 PM COMPARISON: None available. CLINICAL HISTORY: Infection. FINDINGS: BONES AND JOINTS: No radiographic evidence of osteomyelitis. No acute fracture or dislocation. SOFT TISSUES: Soft tissue ulceration/irregularity about the 1st and 2nd toes. IMPRESSION: 1. Soft tissue ulceration/irregularity about the 1st and 2nd toes without radiographic evidence of osteomyelitis. Electronically  signed by: Norman Gatlin MD 03/15/2024 05:20 PM EST RP Workstation: [id number]   DG Chest 2 View if patient is not in a treatment room. Result Date: 03/15/2024 EXAM: 2 VIEW(S) XRAY OF THE CHEST 03/15/2024 05:07:00 PM COMPARISON: None available. CLINICAL HISTORY: Suspected sepsis. FINDINGS: LUNGS AND PLEURA: No focal pulmonary opacity. No pleural effusion. No pneumothorax. HEART AND MEDIASTINUM: Coronary stents noted. No acute abnormality of the cardiac and mediastinal silhouettes. BONES AND SOFT TISSUES: Vertebroplasty changes in lower thoracic spine. Chronic compression deformities in thoracic spine. IMPRESSION: 1. No acute cardiopulmonary abnormality. Electronically signed by: Norman Gatlin MD 03/15/2024 05:17 PM EST RP Workstation: [id number]    Blood pressure (!) 145/73, pulse 93, temperature 97.8 F (36.6 C), temperature source Oral, resp. rate 16, height 5' 3 (1.6 m), weight 59 kg, last menstrual period 09/29/2015, SpO2 99%.  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 - Dry gangrene present to 1st and 2nd toes left foot which is chronic at this time. - Do not believe antibiotics are indicated at this time his toes are stable. - Appreciate vascular recommendations, planning for intervention on Tuesday.  Will follow-up after that time for plan of possible amputation of 1st and 2nd toes. - Discussed with patient we will follow back up after revascularization procedure to discuss this further. - Recommend Betadine paint to 1st and 2nd toes daily and to keep areas dry.  Prentice Lee, DPM 03/17/2024, 1:25 PM         [1]  Allergies Allergen Reactions   Bee Venom Anaphylaxis

## 2024-03-17 NOTE — Consult Note (Signed)
 Pharmacy Consult Note - Anticoagulation  Pharmacy Consult for heparin  Indication: limb ischemia  PATIENT MEASUREMENTS: Height: 5' 3 (160 cm) Weight: 59 kg (130 lb) IBW/kg (Calculated) : 52.4 HEPARIN  DW (KG): 59  VITAL SIGNS: Temp: 97.9 F (36.6 C) (02/01 0814) Temp Source: Oral (02/01 0814) BP: 142/74 (02/01 0700) Pulse Rate: 93 (02/01 0700)  Recent Labs    03/15/24 1650 03/15/24 2000 03/16/24 0253 03/17/24 0429  HGB 11.5*  --    < > 9.4*  HCT 35.3*  --    < > 29.9*  PLT 268  --    < > 197  APTT  --  35  --   --   LABPROT 13.3  --   --   --   INR 1.0  --   --   --   HEPARINUNFRC  --   --    < > 0.68  CREATININE 0.69  --   --   --    < > = values in this interval not displayed.    Estimated Creatinine Clearance: 67.3 mL/min (by C-G formula based on SCr of 0.69 mg/dL).  PAST MEDICAL HISTORY: Past Medical History:  Diagnosis Date   Coronary artery disease    Diabetes mellitus without complication (HCC)    Hypertension    MI (myocardial infarction) (HCC)    PVD (peripheral vascular disease)    Tick bite    Pt states she does not remember daye of tic bite, but that is was since last visit with pain clinic and she was hospitalized for 1 day.    ASSESSMENT: 54 y.o. female with PMH including T2DM, HLD, CAD/NSTEMI, PAD is presenting with limb ischemia. Patient is not on chronic anticoagulation per chart review. Pharmacy has been consulted to initiate and manage heparin  intravenous infusion.  Pertinent medications: No chronic anticoagulation PTA per chart review Clopidogrel  75mg  daily Aspirin  81mg  daily  Goal(s) of therapy: Heparin  level 0.3 - 0.7 units/mL Monitor platelets by anticoagulation protocol: Yes   Baseline anticoagulation labs: Recent Labs    03/15/24 1650 03/15/24 2000 03/16/24 0253 03/17/24 0429  APTT  --  35  --   --   INR 1.0  --   --   --   HGB 11.5*  --  9.0* 9.4*  PLT 268  --  214 197  Baseline aPTT also ordered   PLAN: heparin  level  therapeutic x 3 - continue heparin  infusion at 1000 units/hr - recheck next heparin  level in am 02/02 - Monitor CBC daily while on heparin  infusion.  Adriana JONETTA Bolster Clinical Pharmacist 03/17/2024 8:55 AM

## 2024-03-17 NOTE — Progress Notes (Signed)
 [facility]  - [place] [facility]   PATIENT NAME: Claudia Mills    MR#:  [id number]  DATE OF BIRTH:  August 04, 1970  SUBJECTIVE:      VITALS:  Blood pressure (!) 142/74, pulse 93, temperature 97.9 F (36.6 C), temperature source Oral, resp. rate 15, height 5' 3 (1.6 m), weight 59 kg, last menstrual period 09/29/2015, SpO2 100%.  PHYSICAL EXAMINATION:   GENERAL:  54 y.o.-year-old patient with no acute distress.  LUNGS: Normal breath sounds bilaterally, no wheezing CARDIOVASCULAR: S1, S2 normal. No murmur   ABDOMEN: Soft, nontender, nondistended. Bowel sounds present.  EXTREMITIES: No  edema b/l.    NEUROLOGIC: nonfocal  patient is alert and awake SKIN: No obvious rash, lesion, or ulcer.   LABORATORY PANEL:  CBC Recent Labs  Lab 03/17/24 0429  WBC 5.4  HGB 9.4*  HCT 29.9*  PLT 197    Chemistries  Recent Labs  Lab 03/15/24 1650  NA 131*  K 3.9  CL 94*  CO2 24  GLUCOSE 240*  BUN 5*  CREATININE 0.69  CALCIUM  10.0  AST 28  ALT 25  ALKPHOS 140*  BILITOT 0.3   Cardiac Enzymes No results for input(s): TROPONINI in the last 168 hours. RADIOLOGY:  DG Foot Complete Left Result Date: 03/15/2024 EXAM: 3 OR MORE VIEW(S) XRAY OF THE LEFT FOOT 03/15/2024 05:07:00 PM COMPARISON: None available. CLINICAL HISTORY: Infection. FINDINGS: BONES AND JOINTS: No radiographic evidence of osteomyelitis. No acute fracture or dislocation. SOFT TISSUES: Soft tissue ulceration/irregularity about the 1st and 2nd toes. IMPRESSION: 1. Soft tissue ulceration/irregularity about the 1st and 2nd toes without radiographic evidence of osteomyelitis. Electronically signed by: Norman Gatlin MD 03/15/2024 05:20 PM EST RP Workstation: [id number]   DG Chest 2 View if patient is not in a treatment room. Result Date: 03/15/2024 EXAM: 2 VIEW(S) XRAY OF THE CHEST 03/15/2024 05:07:00 PM COMPARISON: None available. CLINICAL HISTORY: Suspected sepsis. FINDINGS: LUNGS AND PLEURA: No focal  pulmonary opacity. No pleural effusion. No pneumothorax. HEART AND MEDIASTINUM: Coronary stents noted. No acute abnormality of the cardiac and mediastinal silhouettes. BONES AND SOFT TISSUES: Vertebroplasty changes in lower thoracic spine. Chronic compression deformities in thoracic spine. IMPRESSION: 1. No acute cardiopulmonary abnormality. Electronically signed by: Norman Gatlin MD 03/15/2024 05:17 PM EST RP Workstation: [id number]    Assessment and Plan Claudia Mills is a 54 y.o. female with a PMH significant for CAD with history of NSTEMI s/p stent mid LAD, HTN, DM with neuropathy, PAD with prior revascularization 10/2023, currently with dry gangrene of toes left foot, ongoing rest pain,chronic nonhealing surgical wound due to suspected narrowing of prior bypass,  being admitted with suspected cellulitis due to wound infection as well as limb ischemia.   Chest x-ray nonacute Left foot x-ray showing soft tissue ulceration about the 1st and 2nd toes without evidence of osteomyelitis   Critical limb ischemia of left lower extremity with rest pain (HCC) --Continue heparin  infusion --Vascular surgery tentatively planning an intervention next week - -analgesia PRN   Nonhealing surgical wound with infection , initial encounter Gangrene toes of left foot PAD s/p bypass 10/2023 with chronic nonhealing surgical wound --Pain improved today --Continue heparin  infusion --Pain control   Uncontrolled type 2 diabetes mellitus with hyperglycemia (HCC) --Sliding scale insulin  coverage   CAD S/P percutaneous coronary angioplasty --No complaints of chest pain --Continue lisinopril  atorvastatin , aspirin  with nitroglycerin  sublingual as needed   Essential hypertension --Continue lisinopril     Procedures: Family communication :none at bedside Consults :Vascular CODE  STATUS: full DVT Prophylaxis :heparin  gtt Level of care: Med-Surg Status is: Inpatient Remains inpatient appropriate because:  Awaiting definitive vascular surgery plan next week    TOTAL TIME TAKING CARE OF THIS PATIENT: 40 minutes.  >50% time spent on counselling and coordination of care  Note: This dictation was prepared with Dragon dictation along with smaller phrase technology. Any transcriptional errors that result from this process are unintentional.  [name] M.D    [facility]   CC: Primary care physician; Lorel Maxie LABOR, MD

## 2024-03-18 DIAGNOSIS — E1165 Type 2 diabetes mellitus with hyperglycemia: Secondary | ICD-10-CM | POA: Diagnosis not present

## 2024-03-18 DIAGNOSIS — I70222 Atherosclerosis of native arteries of extremities with rest pain, left leg: Secondary | ICD-10-CM | POA: Diagnosis not present

## 2024-03-18 DIAGNOSIS — I2510 Atherosclerotic heart disease of native coronary artery without angina pectoris: Secondary | ICD-10-CM | POA: Diagnosis not present

## 2024-03-18 DIAGNOSIS — T8189XA Other complications of procedures, not elsewhere classified, initial encounter: Secondary | ICD-10-CM | POA: Diagnosis not present

## 2024-03-18 LAB — CBC
HCT: 29.6 % — ABNORMAL LOW (ref 36.0–46.0)
Hemoglobin: 9.4 g/dL — ABNORMAL LOW (ref 12.0–15.0)
MCH: 29.5 pg (ref 26.0–34.0)
MCHC: 31.8 g/dL (ref 30.0–36.0)
MCV: 92.8 fL (ref 80.0–100.0)
Platelets: 215 10*3/uL (ref 150–400)
RBC: 3.19 MIL/uL — ABNORMAL LOW (ref 3.87–5.11)
RDW: 17.1 % — ABNORMAL HIGH (ref 11.5–15.5)
WBC: 5.6 10*3/uL (ref 4.0–10.5)
nRBC: 0 % (ref 0.0–0.2)

## 2024-03-18 LAB — BASIC METABOLIC PANEL WITH GFR
Anion gap: 10 (ref 5–15)
BUN: <5 mg/dL — ABNORMAL LOW (ref 6–20)
CO2: 26 mmol/L (ref 22–32)
Calcium: 8.9 mg/dL (ref 8.9–10.3)
Chloride: 101 mmol/L (ref 98–111)
Creatinine, Ser: 0.54 mg/dL (ref 0.44–1.00)
GFR, Estimated: >60 mL/min
Glucose, Bld: 121 mg/dL — ABNORMAL HIGH (ref 70–99)
Potassium: 3.7 mmol/L (ref 3.5–5.1)
Sodium: 136 mmol/L (ref 135–145)

## 2024-03-18 LAB — URINE CULTURE: Culture: NO GROWTH

## 2024-03-18 LAB — CBG MONITORING, ED: Glucose-Capillary: 157 mg/dL — ABNORMAL HIGH (ref 70–99)

## 2024-03-18 LAB — CREATININE, SERUM
Creatinine, Ser: 0.63 mg/dL (ref 0.44–1.00)
GFR, Estimated: >60 mL/min

## 2024-03-18 LAB — HEPARIN LEVEL (UNFRACTIONATED): Heparin Unfractionated: 0.62 [IU]/mL (ref 0.30–0.70)

## 2024-03-18 LAB — GLUCOSE, CAPILLARY: Glucose-Capillary: 133 mg/dL — ABNORMAL HIGH (ref 70–99)

## 2024-03-18 MED ORDER — INSULIN ASPART 100 UNIT/ML IJ SOLN
0.0000 [IU] | Freq: Every day | INTRAMUSCULAR | Status: AC
  Administered 2024-03-19 – 2024-03-21 (×2): 2 [IU] via SUBCUTANEOUS
  Filled 2024-03-18 (×2): qty 2

## 2024-03-18 MED ORDER — POLYETHYLENE GLYCOL 3350 17 G PO PACK
17.0000 g | PACK | Freq: Every day | ORAL | Status: DC | PRN
  Administered 2024-03-20: 17 g via ORAL
  Filled 2024-03-18: qty 1

## 2024-03-18 MED ORDER — INSULIN ASPART 100 UNIT/ML IJ SOLN
0.0000 [IU] | Freq: Three times a day (TID) | INTRAMUSCULAR | Status: AC
  Administered 2024-03-18 – 2024-03-19 (×2): 2 [IU] via SUBCUTANEOUS
  Administered 2024-03-20: 1 [IU] via SUBCUTANEOUS
  Administered 2024-03-20 (×2): 2 [IU] via SUBCUTANEOUS
  Administered 2024-03-21 – 2024-03-22 (×2): 3 [IU] via SUBCUTANEOUS
  Administered 2024-03-22: 2 [IU] via SUBCUTANEOUS
  Administered 2024-03-22: 1 [IU] via SUBCUTANEOUS
  Filled 2024-03-18 (×2): qty 2
  Filled 2024-03-18: qty 1
  Filled 2024-03-18: qty 2
  Filled 2024-03-18: qty 1
  Filled 2024-03-18: qty 3
  Filled 2024-03-18: qty 2
  Filled 2024-03-18: qty 1
  Filled 2024-03-18: qty 3

## 2024-03-18 NOTE — ED Notes (Addendum)
 RN unable to find a pedal or post ankle pulse on the left foot with doppler. Pt complaining of pain behind the left knee as well. RN medicated pt for pain. RN reached out to hospitalist and vascular. Per Delores PIETY  I talked with Dr. Marea, he said we'll move her up to first case tomorrow but no intervention now.

## 2024-03-19 ENCOUNTER — Encounter: Payer: Self-pay | Admitting: Internal Medicine

## 2024-03-19 ENCOUNTER — Encounter: Admission: EM | Payer: Self-pay | Source: Home / Self Care | Attending: Internal Medicine

## 2024-03-19 ENCOUNTER — Inpatient Hospital Stay: Admitting: Certified Registered"

## 2024-03-19 DIAGNOSIS — E1165 Type 2 diabetes mellitus with hyperglycemia: Secondary | ICD-10-CM | POA: Diagnosis not present

## 2024-03-19 DIAGNOSIS — I70222 Atherosclerosis of native arteries of extremities with rest pain, left leg: Secondary | ICD-10-CM | POA: Diagnosis not present

## 2024-03-19 DIAGNOSIS — I2510 Atherosclerotic heart disease of native coronary artery without angina pectoris: Secondary | ICD-10-CM | POA: Diagnosis not present

## 2024-03-19 DIAGNOSIS — T8189XA Other complications of procedures, not elsewhere classified, initial encounter: Secondary | ICD-10-CM | POA: Diagnosis not present

## 2024-03-19 LAB — CREATININE, SERUM
Creatinine, Ser: 0.52 mg/dL (ref 0.44–1.00)
Creatinine, Ser: 0.51 mg/dL (ref 0.44–1.00)
GFR, Estimated: >60 mL/min
GFR, Estimated: >60 mL/min

## 2024-03-19 LAB — BUN: BUN: <5 mg/dL — ABNORMAL LOW (ref 6–20)

## 2024-03-19 LAB — HIV ANTIBODY (ROUTINE TESTING W REFLEX): HIV Screen 4th Generation wRfx: NONREACTIVE

## 2024-03-19 LAB — GLUCOSE, CAPILLARY
Glucose-Capillary: 113 mg/dL — ABNORMAL HIGH (ref 70–99)
Glucose-Capillary: 152 mg/dL — ABNORMAL HIGH (ref 70–99)
Glucose-Capillary: 247 mg/dL — ABNORMAL HIGH (ref 70–99)

## 2024-03-19 LAB — CBC
HCT: 29.7 % — ABNORMAL LOW (ref 36.0–46.0)
Hemoglobin: 9.5 g/dL — ABNORMAL LOW (ref 12.0–15.0)
MCH: 29.6 pg (ref 26.0–34.0)
MCHC: 32 g/dL (ref 30.0–36.0)
MCV: 92.5 fL (ref 80.0–100.0)
Platelets: 235 10*3/uL (ref 150–400)
RBC: 3.21 MIL/uL — ABNORMAL LOW (ref 3.87–5.11)
RDW: 16.8 % — ABNORMAL HIGH (ref 11.5–15.5)
WBC: 5.9 10*3/uL (ref 4.0–10.5)
nRBC: 0 % (ref 0.0–0.2)

## 2024-03-19 LAB — HEPARIN LEVEL (UNFRACTIONATED): Heparin Unfractionated: 0.4 [IU]/mL (ref 0.30–0.70)

## 2024-03-19 MED ORDER — SODIUM CHLORIDE 0.9 % IV SOLN: INTRAVENOUS | Status: AC

## 2024-03-19 MED ORDER — SODIUM CHLORIDE 0.9% FLUSH
3.0000 mL | Freq: Two times a day (BID) | INTRAVENOUS | Status: DC
  Administered 2024-03-19 – 2024-03-20 (×3): 3 mL via INTRAVENOUS

## 2024-03-19 MED ORDER — PHENYLEPHRINE HCL-NACL 20-0.9 MG/250ML-% IV SOLN
INTRAVENOUS | Status: AC
  Filled 2024-03-19: qty 250

## 2024-03-19 MED ORDER — IODIXANOL 320 MG/ML IV SOLN
INTRAVENOUS | Status: DC | PRN
  Administered 2024-03-19: 75 mL via INTRA_ARTERIAL

## 2024-03-19 MED ORDER — LIDOCAINE HCL (CARDIAC) PF 100 MG/5ML IV SOSY
PREFILLED_SYRINGE | INTRAVENOUS | Status: DC | PRN
  Administered 2024-03-19: 80 mg via INTRAVENOUS

## 2024-03-19 MED ORDER — DEXMEDETOMIDINE HCL IN NACL 80 MCG/20ML IV SOLN
INTRAVENOUS | Status: AC
  Filled 2024-03-19: qty 20

## 2024-03-19 MED ORDER — ONDANSETRON HCL 4 MG/2ML IJ SOLN
INTRAMUSCULAR | Status: AC
  Filled 2024-03-19: qty 2

## 2024-03-19 MED ORDER — FAMOTIDINE 20 MG PO TABS: 40.0000 mg | ORAL_TABLET | Freq: Once | ORAL | Status: DC | PRN

## 2024-03-19 MED ORDER — EPHEDRINE 5 MG/ML INJ
INTRAVENOUS | Status: AC
  Filled 2024-03-19: qty 5

## 2024-03-19 MED ORDER — LIDOCAINE HCL (PF) 2 % IJ SOLN
INTRAMUSCULAR | Status: AC
  Filled 2024-03-19: qty 5

## 2024-03-19 MED ORDER — SODIUM CHLORIDE 0.9% FLUSH: 3.0000 mL | INTRAVENOUS | Status: DC | PRN

## 2024-03-19 MED ORDER — LIDOCAINE HCL (PF) 1 % IJ SOLN
INTRAMUSCULAR | Status: DC | PRN
  Administered 2024-03-19: 10 mL via INTRADERMAL

## 2024-03-19 MED ORDER — HEPARIN (PORCINE) 25000 UT/250ML-% IV SOLN
1000.0000 [IU]/h | INTRAVENOUS | Status: DC
  Administered 2024-03-19 – 2024-03-21 (×3): 1000 [IU]/h via INTRAVENOUS
  Filled 2024-03-19 (×2): qty 250

## 2024-03-19 MED ORDER — MIDAZOLAM HCL (PF) 2 MG/2ML IJ SOLN
INTRAMUSCULAR | Status: DC | PRN
  Administered 2024-03-19: 2 mg via INTRAVENOUS

## 2024-03-19 MED ORDER — PROPOFOL 10 MG/ML IV BOLUS
INTRAVENOUS | Status: AC
  Filled 2024-03-19: qty 20

## 2024-03-19 MED ORDER — MIDAZOLAM HCL 2 MG/2ML IJ SOLN
INTRAMUSCULAR | Status: AC
  Filled 2024-03-19: qty 2

## 2024-03-19 MED ORDER — LACTATED RINGERS IV SOLN: INTRAVENOUS | Status: DC | PRN

## 2024-03-19 MED ORDER — CEFAZOLIN SODIUM-DEXTROSE 2-4 GM/100ML-% IV SOLN
2.0000 g | INTRAVENOUS | Status: AC
  Administered 2024-03-19: 2 g via INTRAVENOUS

## 2024-03-19 MED ORDER — ONDANSETRON HCL 4 MG/2ML IJ SOLN: 4.0000 mg | Freq: Four times a day (QID) | INTRAMUSCULAR | Status: DC | PRN

## 2024-03-19 MED ORDER — HEPARIN SODIUM (PORCINE) 1000 UNIT/ML IJ SOLN
INTRAMUSCULAR | Status: AC
  Filled 2024-03-19: qty 10

## 2024-03-19 MED ORDER — KETAMINE HCL 50 MG/5ML IJ SOSY
PREFILLED_SYRINGE | INTRAMUSCULAR | Status: DC | PRN
  Administered 2024-03-19 (×2): 10 mg via INTRAVENOUS

## 2024-03-19 MED ORDER — NITROGLYCERIN 1 MG/10 ML FOR IR/CATH LAB
INTRA_ARTERIAL | Status: DC | PRN
  Administered 2024-03-19: 200 ug via INTRA_ARTERIAL

## 2024-03-19 MED ORDER — DEXMEDETOMIDINE HCL IN NACL 80 MCG/20ML IV SOLN
INTRAVENOUS | Status: DC | PRN
  Administered 2024-03-19: 12 ug via INTRAVENOUS

## 2024-03-19 MED ORDER — ONDANSETRON HCL 4 MG/2ML IJ SOLN
INTRAMUSCULAR | Status: DC | PRN
  Administered 2024-03-19: 4 mg via INTRAVENOUS

## 2024-03-19 MED ORDER — PHENYLEPHRINE HCL-NACL 20-0.9 MG/250ML-% IV SOLN
INTRAVENOUS | Status: DC | PRN
  Administered 2024-03-19: 75 ug/min via INTRAVENOUS

## 2024-03-19 MED ORDER — EPHEDRINE SULFATE-NACL 50-0.9 MG/10ML-% IV SOSY
PREFILLED_SYRINGE | INTRAVENOUS | Status: DC | PRN
  Administered 2024-03-19 (×3): 10 mg via INTRAVENOUS
  Administered 2024-03-19: 5 mg via INTRAVENOUS

## 2024-03-19 MED ORDER — PHENYLEPHRINE 80 MCG/ML (10ML) SYRINGE FOR IV PUSH (FOR BLOOD PRESSURE SUPPORT)
PREFILLED_SYRINGE | INTRAVENOUS | Status: DC | PRN
  Administered 2024-03-19: 240 ug via INTRAVENOUS
  Administered 2024-03-19: 80 ug via INTRAVENOUS
  Administered 2024-03-19 (×2): 240 ug via INTRAVENOUS

## 2024-03-19 MED ORDER — HEPARIN (PORCINE) IN NACL 1000-0.9 UT/500ML-% IV SOLN
INTRAVENOUS | Status: DC | PRN
  Administered 2024-03-19: 1000 mL

## 2024-03-19 MED ORDER — SODIUM CHLORIDE 0.9 % IV SOLN: 250.0000 mL | INTRAVENOUS | Status: DC | PRN

## 2024-03-19 MED ORDER — MIDAZOLAM HCL 2 MG/ML PO SYRP
8.0000 mg | ORAL_SOLUTION | Freq: Once | ORAL | Status: AC | PRN
  Administered 2024-03-19: 8 mg via ORAL

## 2024-03-19 MED ORDER — SODIUM CHLORIDE 0.9 % IV SOLN: INTRAVENOUS | Status: DC

## 2024-03-19 MED ORDER — PROPOFOL 10 MG/ML IV BOLUS
INTRAVENOUS | Status: DC | PRN
  Administered 2024-03-19: 200 mg via INTRAVENOUS

## 2024-03-19 MED ORDER — METHYLPREDNISOLONE SODIUM SUCC 125 MG IJ SOLR: 125.0000 mg | Freq: Once | INTRAMUSCULAR | Status: DC | PRN

## 2024-03-19 MED ORDER — HEPARIN SODIUM (PORCINE) 1000 UNIT/ML IJ SOLN
INTRAMUSCULAR | Status: DC | PRN
  Administered 2024-03-19: 6000 [IU] via INTRAVENOUS

## 2024-03-19 MED ORDER — CEFAZOLIN SODIUM-DEXTROSE 2-4 GM/100ML-% IV SOLN
INTRAVENOUS | Status: AC
  Filled 2024-03-19: qty 100

## 2024-03-19 MED ORDER — PHENYLEPHRINE 80 MCG/ML (10ML) SYRINGE FOR IV PUSH (FOR BLOOD PRESSURE SUPPORT)
PREFILLED_SYRINGE | INTRAVENOUS | Status: AC
  Filled 2024-03-19: qty 10

## 2024-03-19 MED ORDER — HYDROMORPHONE HCL 1 MG/ML IJ SOLN
1.0000 mg | Freq: Once | INTRAMUSCULAR | Status: DC | PRN
  Filled 2024-03-19: qty 1

## 2024-03-19 MED ORDER — KETAMINE HCL 50 MG/5ML IJ SOSY
PREFILLED_SYRINGE | INTRAMUSCULAR | Status: AC
  Filled 2024-03-19: qty 5

## 2024-03-19 MED ORDER — DIPHENHYDRAMINE HCL 50 MG/ML IJ SOLN: 50.0000 mg | Freq: Once | INTRAMUSCULAR | Status: DC | PRN

## 2024-03-19 MED ORDER — MIDAZOLAM HCL 2 MG/ML PO SYRP
ORAL_SOLUTION | ORAL | Status: AC
  Filled 2024-03-19: qty 5

## 2024-03-19 NOTE — Transfer of Care (Signed)
 Immediate Anesthesia Transfer of Care Note  Patient: Claudia Mills  Procedure(s) Performed: Lower Extremity Angiography (Left) PERIPHERAL VASCULAR THROMBECTOMY  Patient Location: PACU and Nursing Unit  Anesthesia Type:General  Level of Consciousness: awake, alert , and oriented  Airway & Oxygen Therapy: Patient Spontanous Breathing and Patient connected to nasal cannula oxygen  Post-op Assessment: Report given to RN and Post -op Vital signs reviewed and stable  Post vital signs: stable  Last Vitals:  Vitals Value Taken Time  BP    Temp    Pulse    Resp    SpO2      Last Pain:  Vitals:   03/19/24 1129  TempSrc: Temporal  PainSc: 7       Patients Stated Pain Goal: 3 (03/15/24 2000)  Complications: There were no known notable events for this encounter.

## 2024-03-19 NOTE — Plan of Care (Signed)
  Problem: Education: Goal: Ability to describe self-care measures that may prevent or decrease complications (Diabetes Survival Skills Education) will improve Outcome: Progressing   Problem: Coping: Goal: Ability to adjust to condition or change in health will improve Outcome: Progressing

## 2024-03-19 NOTE — TOC CM/SW Note (Addendum)
 Transition of Care [facility]) CM/SW Note    Transition of Care [facility]) - Inpatient Brief Assessment   Patient Details  Name: Claudia Mills MRN: [id number] Date of Birth: 27-Apr-1970  Transition of Care [facility]) CM/SW Contact:    Alvaro Louder, LCSW Phone Number: 03/19/2024, 9:21 AM   Clinical Narrative:  Per chart review TOC consulted for medication assistance. LCSWA referred to Pharmacy for medications. LCSWA to follow recommendations placed by PT and OT.  TOC to follow for discharges   Transition of Care Asessment: Insurance and Status: Insurance coverage has been reviewed Patient has primary care physician: Yes Home environment has been reviewed: Single family home Prior level of function:: Market Researcher Home Services: No current home services Social Drivers of Health Review: SDOH reviewed no interventions necessary Readmission risk has been reviewed: Yes Transition of care needs: no transition of care needs at this time

## 2024-03-19 NOTE — Plan of Care (Signed)
" °  Problem: Education: Goal: Ability to describe self-care measures that may prevent or decrease complications (Diabetes Survival Skills Education) will improve Outcome: Progressing   Problem: Coping: Goal: Ability to adjust to condition or change in health will improve Outcome: Progressing   Problem: Skin Integrity: Goal: Risk for impaired skin integrity will decrease Outcome: Progressing   Problem: Education: Goal: Knowledge of General Education information will improve Description: Including pain rating scale, medication(s)/side effects and non-pharmacologic comfort measures Outcome: Progressing   Problem: Clinical Measurements: Goal: Cardiovascular complication will be avoided Outcome: Progressing   Problem: Coping: Goal: Level of anxiety will decrease Outcome: Progressing   "

## 2024-03-19 NOTE — Consult Note (Signed)
 Pharmacy Consult Note - Anticoagulation  Pharmacy Consult for heparin  Indication: limb ischemia  PATIENT MEASUREMENTS: Height: 5' 3 (160 cm) Weight: 59 kg (130 lb) IBW/kg (Calculated) : 52.4 HEPARIN  DW (KG): 59  VITAL SIGNS: Temp: 97.8 F (36.6 C) (02/03 0304) BP: 154/76 (02/03 0304) Pulse Rate: 93 (02/03 0304)  Recent Labs    03/19/24 0508  HGB 9.5*  HCT 29.7*  PLT 235  HEPARINUNFRC 0.40  CREATININE 0.52    Estimated Creatinine Clearance: 67.3 mL/min (by C-G formula based on SCr of 0.52 mg/dL).  PAST MEDICAL HISTORY: Past Medical History:  Diagnosis Date   Coronary artery disease    Diabetes mellitus without complication (HCC)    Hypertension    MI (myocardial infarction) (HCC)    PVD (peripheral vascular disease)    Tick bite    Pt states she does not remember daye of tic bite, but that is was since last visit with pain clinic and she was hospitalized for 1 day.    ASSESSMENT: 54 y.o. female with PMH including T2DM, HLD, CAD/NSTEMI, PAD is presenting with limb ischemia. Patient is not on chronic anticoagulation per chart review. Pharmacy has been consulted to initiate and manage heparin  intravenous infusion.  Pertinent medications: No chronic anticoagulation PTA per chart review Clopidogrel  75mg  daily Aspirin  81mg  daily  Goal(s) of therapy: Heparin  level 0.3 - 0.7 units/mL Monitor platelets by anticoagulation protocol: Yes   Baseline anticoagulation labs: Recent Labs    03/17/24 0429 03/18/24 0405 03/19/24 0508  HGB 9.4* 9.4* 9.5*  PLT 197 215 235  Baseline aPTT also ordered   PLAN: 02/03:  HL @ 0508 = 0.40, therapeutic X 5 - will continue pt on current rate and recheck HL on 2/04 with AM labs - Monitor CBC daily while on heparin  infusion.  Rankin CANDIE Dills, PharmD, [facility] 03/19/2024 6:25 AM

## 2024-03-19 NOTE — Anesthesia Procedure Notes (Signed)
 Procedure Name: LMA Insertion Date/Time: 03/19/2024 12:32 PM  Performed by: Governor Mayo B, CRNAPre-anesthesia Checklist: Patient identified, Emergency Drugs available, Patient being monitored, Suction available and Timeout performed Patient Re-evaluated:Patient Re-evaluated prior to induction Oxygen Delivery Method: Circle system utilized Preoxygenation: Pre-oxygenation with 100% oxygen Induction Type: IV induction Ventilation: Mask ventilation without difficulty LMA: LMA with gastric port inserted LMA Size: 4.0 Number of attempts: 1 Dental Injury: Teeth and Oropharynx as per pre-operative assessment

## 2024-03-19 NOTE — Consult Note (Signed)
 WOC Nurse Consult Note: patient with known L leg ischemia and planned intervention by vascular 03/19/2024; has been evaluated by podiatry with instructions per their note to paint L toes with Betadine   Reason for Consult:wounds to left leg and foot  Wound type:1.  Full thickness non healing surgical wound post vascular intervention to L leg  2.  Ischemic toes L 1st and 2nd digits  Pressure Injury POA: not pressure  Measurement: see nursing flowsheet  Wound bed: full thickness above L knee with thick brown scabbed material; 3 separate areas to L lower leg with dark devitalized tissue  Drainage (amount, consistency, odor) appears dry  Periwound:  Dressing procedure/placement/frequency:  Paint wounds above L knee and L lower leg with Betadine, allow to air dry. Apply Xeroform gauze (TI#[id number]) to wound beds daily and secure with silicone foam or dry gauze and Kerlix roll gauze whichever is preferred.  Paint 1st and 2nd toes L foot with Betadine 2 times daily.    Patient with planned intervention by vascular. On review of vascular notes they had been using Santyl to some of the wounds for debridement. Patient should continue to follow with vascular surgeon for ongoing management of wounds.   POC discussed with bedside nurse. WOC team will not follow. Reconsult if further needs arise.   Thank you,    Powell Bar MSN, RN-BC, [employer]

## 2024-03-19 NOTE — Interval H&P Note (Signed)
 History and Physical Interval Note:  03/19/2024 8:59 AM  Claudia Mills  has presented today for surgery, with the diagnosis of Critical limb ischemia.  The various methods of treatment have been discussed with the patient and family. After consideration of risks, benefits and other options for treatment, the patient has consented to  Procedures: Lower Extremity Angiography (Left) as a surgical intervention.  The patient's history has been reviewed, patient examined, no change in status, stable for surgery.  I have reviewed the patient's chart and labs.  Questions were answered to the patient's satisfaction.     [name]

## 2024-03-20 ENCOUNTER — Ambulatory Visit: Admitting: Student in an Organized Health Care Education/Training Program

## 2024-03-20 LAB — CULTURE, BLOOD (ROUTINE X 2)
Culture: NO GROWTH
Culture: NO GROWTH
Special Requests: ADEQUATE

## 2024-03-20 LAB — BPAM RBC
Blood Product Expiration Date: 202602232359
Blood Product Expiration Date: 202603082359
Blood Product Expiration Date: 202603082359
Blood Product Expiration Date: 202603082359
Unit Type and Rh: 5100
Unit Type and Rh: 5100
Unit Type and Rh: 5100
Unit Type and Rh: 5100

## 2024-03-20 LAB — TYPE AND SCREEN
ABO/RH(D): O POS
Antibody Screen: NEGATIVE
Unit division: 0
Unit division: 0
Unit division: 0
Unit division: 0

## 2024-03-20 LAB — GLUCOSE, CAPILLARY
Glucose-Capillary: 162 mg/dL — ABNORMAL HIGH (ref 70–99)
Glucose-Capillary: 138 mg/dL — ABNORMAL HIGH (ref 70–99)
Glucose-Capillary: 177 mg/dL — ABNORMAL HIGH (ref 70–99)
Glucose-Capillary: 124 mg/dL — ABNORMAL HIGH (ref 70–99)

## 2024-03-20 LAB — PREPARE RBC (CROSSMATCH)

## 2024-03-20 LAB — CBC
HCT: 26.1 % — ABNORMAL LOW (ref 36.0–46.0)
Hemoglobin: 8.4 g/dL — ABNORMAL LOW (ref 12.0–15.0)
MCH: 29.5 pg (ref 26.0–34.0)
MCHC: 32.2 g/dL (ref 30.0–36.0)
MCV: 91.6 fL (ref 80.0–100.0)
Platelets: 227 10*3/uL (ref 150–400)
RBC: 2.85 MIL/uL — ABNORMAL LOW (ref 3.87–5.11)
RDW: 16.9 % — ABNORMAL HIGH (ref 11.5–15.5)
WBC: 7.4 10*3/uL (ref 4.0–10.5)
nRBC: 0 % (ref 0.0–0.2)

## 2024-03-20 LAB — HEPARIN LEVEL (UNFRACTIONATED)
Heparin Unfractionated: 0.5 [IU]/mL (ref 0.30–0.70)
Heparin Unfractionated: 0.4 [IU]/mL (ref 0.30–0.70)

## 2024-03-20 MED ORDER — CHLORHEXIDINE GLUCONATE CLOTH 2 % EX PADS: 6.0000 | MEDICATED_PAD | Freq: Every day | CUTANEOUS | Status: DC

## 2024-03-20 MED ORDER — CEFAZOLIN SODIUM-DEXTROSE 2-4 GM/100ML-% IV SOLN: 2.0000 g | INTRAVENOUS | Status: DC

## 2024-03-20 MED ORDER — CHLORHEXIDINE GLUCONATE CLOTH 2 % EX PADS
6.0000 | MEDICATED_PAD | Freq: Every day | CUTANEOUS | Status: DC
  Administered 2024-03-21: 6 via TOPICAL

## 2024-03-20 MED ORDER — ALUM & MAG HYDROXIDE-SIMETH 200-200-20 MG/5ML PO SUSP
15.0000 mL | Freq: Four times a day (QID) | ORAL | Status: AC | PRN
  Administered 2024-03-20: 15 mL via ORAL
  Filled 2024-03-20: qty 30

## 2024-03-20 MED ORDER — GLIPIZIDE ER 5 MG PO TB24
5.0000 mg | ORAL_TABLET | Freq: Every day | ORAL | Status: DC
  Filled 2024-03-20: qty 1

## 2024-03-20 MED ORDER — POLYETHYLENE GLYCOL 3350 17 G PO PACK
17.0000 g | PACK | Freq: Two times a day (BID) | ORAL | Status: DC
  Administered 2024-03-20: 17 g via ORAL
  Filled 2024-03-20 (×2): qty 1

## 2024-03-20 MED ORDER — BISACODYL 5 MG PO TBEC
10.0000 mg | DELAYED_RELEASE_TABLET | Freq: Every day | ORAL | Status: DC
  Administered 2024-03-20: 10 mg via ORAL
  Filled 2024-03-20: qty 2

## 2024-03-20 NOTE — Anesthesia Preprocedure Evaluation (Signed)
 "                                  Anesthesia Evaluation  Patient identified by MRN, date of birth, ID band Patient awake    Reviewed: Allergy & Precautions, H&P , NPO status , Patient's Chart, lab work & pertinent test results  History of Anesthesia Complications Negative for: history of anesthetic complications  Airway Mallampati: III  TM Distance: >3 FB Neck ROM: full    Dental  (+) Upper Dentures, Lower Dentures   Pulmonary shortness of breath and with exertion, neg recent URI, Patient abstained from smoking.   Pulmonary exam normal        Cardiovascular hypertension, (-) angina + CAD, + Past MI (NSTEMI 01/30/2019), + Cardiac Stents (2.5 x 38 mm Xience DES mid LAD (SCAD), 02/01/2019 at [facility]) and + Peripheral Vascular Disease  Normal cardiovascular exam(-) dysrhythmias (-) Valvular Problems/Murmurs  2D echocardiogram 01/30/2019 revealed normal left ventricular function, with LVEF greater than 55%.  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  The patient underwent PCI with 2.5 x 38 mm Xience DES mid LAD with PTCA of diagonal branch   Neuro/Psych neg Seizures PSYCHIATRIC DISORDERS       Neuromuscular disease    GI/Hepatic negative GI ROS,,,(+)     substance abuse  alcohol use and marijuana use  Endo/Other  diabetes, Type 2    Renal/GU      Musculoskeletal  (+) Arthritis ,    Abdominal Normal abdominal exam  (+)   Peds  Hematology  (+) Blood dyscrasia, anemia   Anesthesia Other Findings Critical limb ischemia of left lower extremity with rest pain   Past Medical History: No date: Coronary artery disease No date: Diabetes mellitus without complication (HCC) No date: Hypertension No date: MI (myocardial infarction) (HCC) No date: PVD (peripheral vascular disease) No date: Tick bite     Comment:  Pt states she does not remember  daye of tic bite, but               that is was since last visit with pain clinic and she was              hospitalized for 1 day.  Past Surgical History: No date: CORONARY ANGIOPLASTY WITH STENT PLACEMENT 10/06/2022: IR KYPHO LUMBAR INC FX REDUCE BONE BX UNI/BIL CANNULATION  INC/IMAGING 09/20/2022: IR RADIOLOGIST EVAL & MGMT 10/13/2022: IR RADIOLOGIST EVAL & MGMT 10/20/2022: IR RADIOLOGIST EVAL & MGMT 08/08/2023: LOWER EXTREMITY ANGIOGRAPHY; Left     Comment:  Procedure: Lower Extremity Angiography;  Surgeon:               Jama Cordella MATSU, MD;  Location: [facility] INVASIVE CV LAB;               Service: Cardiovascular;  Laterality: Left; 11/08/2023: LOWER EXTREMITY ANGIOGRAPHY; Left     Comment:  Procedure: Lower Extremity Angiography;  Surgeon:               Jama Cordella MATSU, MD;  Location: [facility] INVASIVE CV LAB;               Service: Cardiovascular;  Laterality: Left; 08/08/2023: LOWER EXTREMITY INTERVENTION; Left     Comment:  Procedure: LOWER EXTREMITY INTERVENTION;  Surgeon:  Schnier, Cordella MATSU, MD;  Location: [facility] INVASIVE CV LAB;               Service: Cardiovascular;  Laterality: Left;  BMI    Body Mass Index: 25.12 kg/m      Reproductive/Obstetrics negative OB ROS                              Anesthesia Physical Anesthesia Plan  ASA: 3  Anesthesia Plan: General   Post-op Pain Management: Tylenol  PO (pre-op)*, Celebrex  PO (pre-op)* and Gabapentin  PO (pre-op)*   Induction: Intravenous  PONV Risk Score and Plan: 2 and Ondansetron , Dexamethasone  and Midazolam   Airway Management Planned: Oral ETT  Additional Equipment: Arterial line  Intra-op Plan:   Post-operative Plan: Extubation in OR  Informed Consent: I have reviewed the patients History and Physical, chart, labs and discussed the procedure including the risks, benefits and alternatives for the proposed anesthesia with the patient or authorized representative who has  indicated his/her understanding and acceptance.     Dental Advisory Given  Plan Discussed with: CRNA and Surgeon  Anesthesia Plan Comments:          Anesthesia Quick Evaluation  "

## 2024-03-20 NOTE — Progress Notes (Incomplete Revision)
 [facility]  - [place] at [facility]   PATIENT NAME: Claudia Mills    MR#:  [id number]  DATE OF BIRTH:  September 28, 1970  SUBJECTIVE:  no family at bedside. Continues with heparin  drip. Patient is status post left lower extremity angiogram with angioplasty and stent placement continues with left leg pain. Did work with physical therapy today.  VITALS:  Blood pressure (!) 110/54, pulse 97, temperature 98.2 F (36.8 C), resp. rate 18, height 5' 3 (1.6 m), weight 59 kg, last menstrual period 09/29/2015, SpO2 99%.  PHYSICAL EXAMINATION:   GENERAL:  54 y.o.-year-old patient with no acute distress.  LUNGS: Normal breath sounds bilaterally, no wheezing CARDIOVASCULAR: S1, S2 normal. No murmur   ABDOMEN: Soft, nontender, nondistended. Bowel sounds present.  EXTREMITIES:  NEUROLOGIC: nonfocal  patient is alert and awake    LABORATORY PANEL:  CBC Recent Labs  Lab 03/20/24 0559  WBC 7.4  HGB 8.4*  HCT 26.1*  PLT 227    Chemistries  Recent Labs  Lab 03/15/24 1650 03/18/24 0405 03/18/24 2152 03/19/24 0508 03/19/24 1555  NA 131*  --  136  --   --   K 3.9  --  3.7  --   --   CL 94*  --  101  --   --   CO2 24  --  26  --   --   GLUCOSE 240*  --  121*  --   --   BUN 5*  --  <5*  --  <5*  CREATININE 0.69   < > 0.54   < > 0.51  CALCIUM  10.0  --  8.9  --   --   AST 28  --   --   --   --   ALT 25  --   --   --   --   ALKPHOS 140*  --   --   --   --   BILITOT 0.3  --   --   --   --    < > = values in this interval not displayed.    Assessment and Plan Claudia Mills is a 54 y.o. female with a PMH significant for CAD with history of NSTEMI s/p stent mid LAD, HTN, DM with neuropathy, PAD with prior revascularization 10/2023, currently with dry gangrene of toes left foot, ongoing rest pain,chronic nonhealing surgical wound due to suspected narrowing of prior bypass,  being admitted with suspected cellulitis due to wound infection as well as limb ischemia.   Chest x-ray  nonacute Left foot x-ray showing soft tissue ulceration about the 1st and 2nd toes without evidence of osteomyelitis   Critical limb ischemia of left lower extremity with rest pain (HCC) --Continue heparin  infusion --2/2Vascular surgery tentatively planning an intervention next week -- podiatry input appreciated. Considering left first and second toe amputation wants intervention done by vascular --Per podiatry--dry gangrene--ok to d/c abxs --2/3--left foot angiogram per Dr schnier --2/4-- status post left lower extremity angioplasty with stent placement and left profunda for [name] artery, left external iliac artery and mechanical thrombectomy left external iliac artery -- in discussion with Dr. Jama and Dr. Greig will podiatry the current left lower prendre for today. Change to aspirin  and Plavix  tomorrow. Patient can discharge to home with outpatient follow-up with Dr. Jama in a week. -- Per Dr. Tanda no indication for amputation given PAD worried about stump healing. -- PT started. Patient walked 20 feet with postop boot. Home health will be arranged.  DME ordered.  Addendum: Dr Jama plans to take pt to OR 03/21/24 for left LE bypass with Cryovein given severe rest pai.   Nonhealing surgical wound with infection , initial encounter Gangrene toes of left foot PAD s/p bypass 10/2023 with chronic nonhealing surgical wound --Pain improved today --Continue heparin  infusion--change to ASA +plavix  from 2/5 --Pain control   Uncontrolled type 2 diabetes mellitus with hyperglycemia (HCC) --Sliding scale insulin  coverage -- will start patient on glipizide  XL   CAD S/P percutaneous coronary angioplasty --No complaints of chest pain --Continue lisinopril  atorvastatin , aspirin  with nitroglycerin  sublingual as needed   Essential hypertension --Continue lisinopril     Procedures: Family communication :none at bedside Consults :Vascular, podiatry CODE STATUS: full DVT Prophylaxis  :heparin  gtt Level of care: Med-Surg Status is: Inpatient Remains inpatient appropriate because: Awaiting definitive vascular and podiatry plan this  week    TOTAL TIME TAKING CARE OF THIS PATIENT: 40 minutes.  >50% time spent on counselling and coordination of care  Note: This dictation was prepared with Dragon dictation along with smaller phrase technology. Any transcriptional errors that result from this process are unintentional.  Claudia Mills M.D    [facility]   CC: Primary care physician; Claudia Mills LABOR, MD

## 2024-03-20 NOTE — Evaluation (Signed)
 Physical Therapy Evaluation Patient Details Name: Claudia Mills MRN: [id number] DOB: 1971-01-14 Today's Date: 03/20/2024  History of Present Illness  Claudia Mills is a 54 y.o. female with medical history significant for CAD with history of NSTEMI s/p stent mid LAD, HTN, DM with neuropathy, PAD with prior revascularization 10/2023, currently with dry gangrene of toes left foot, ongoing rest pain,chronic nonhealing surgical wound due to suspected narrowing of prior bypass,  being admitted with suspected cellulitis due to wound infection as well as limb ischemia.  Last appointment with vascular was 03/11/2024, and angiogram was scheduled for 03/12/2024 however patient could not make it due to the weather.  Today she presents with worsening left foot pain, starting in the toes and extending to the calf.  Patient is s/p L LE angioplasty and stent placements.   Clinical Impression  Patient received in bed, she is pleasant and agrees to PT assessment. Patient is mod I for bed mobility. Pain with movement. She is able to stand with supervision and RW. Ambulated 20 feet with RW and supervision. Patient is pain limited, but moving well. Instructed patient in donning/doffing post op shoe for mobility. She will continue to benefit from skilled PT to improve functional independence and strength.           If plan is discharge home, recommend the following: A little help with walking and/or transfers;A little help with bathing/dressing/bathroom   Can travel by private vehicle    yes    Equipment Recommendations Rolling walker (2 wheels)  Recommendations for Other Services       Functional Status Assessment Patient has had a recent decline in their functional status and demonstrates the ability to make significant improvements in function in a reasonable and predictable amount of time.     Precautions / Restrictions Precautions Precautions: None Recall of Precautions/Restrictions:  Intact Precaution/Restrictions Comments: post op shoe on L Restrictions Weight Bearing Restrictions Per Provider Order: No      Mobility  Bed Mobility Overal bed mobility: Modified Independent                  Transfers Overall transfer level: Modified independent Equipment used: Rolling walker (2 wheels)                    Ambulation/Gait Ambulation/Gait assistance: Supervision Gait Distance (Feet): 20 Feet Assistive device: Rolling walker (2 wheels) Gait Pattern/deviations: Step-to pattern, Decreased stance time - left Gait velocity: decr     General Gait Details: limited by pain. No lob  Stairs            Wheelchair Mobility     Tilt Bed    Modified Rankin (Stroke Patients Only)       Balance Overall balance assessment: Modified Independent                                           Pertinent Vitals/Pain Pain Assessment Pain Assessment: 0-10 Pain Score: 10-Worst pain ever Pain Location: L leg Pain Descriptors / Indicators: Discomfort, Sore, Throbbing Pain Intervention(s): Monitored during session, Repositioned    Home Living Family/patient expects to be discharged to:: Private residence Living Arrangements: Parent Available Help at Discharge: Family;Available PRN/intermittently Type of Home: Mobile home Home Access: Stairs to enter Entrance Stairs-Rails: Right;Left;Can reach both Entrance Stairs-Number of Steps: 5   Home Layout: One level Home Equipment: Rollator (4 wheels)  Prior Function Prior Level of Function : Independent/Modified Independent;Driving             Mobility Comments: pt reports using rollator for mobility, denies falls. gait distance limited by leg pain ADLs Comments: Pt states that she is mostly IND with ADLs, drives regularly     Extremity/Trunk Assessment   Upper Extremity Assessment Upper Extremity Assessment: Overall WFL for tasks assessed    Lower Extremity  Assessment Lower Extremity Assessment: LLE deficits/detail LLE: Unable to fully assess due to pain LLE Coordination: decreased gross motor    Cervical / Trunk Assessment Cervical / Trunk Assessment: Normal  Communication   Communication Communication: No apparent difficulties    Cognition Arousal: Alert Behavior During Therapy: WFL for tasks assessed/performed   PT - Cognitive impairments: No apparent impairments                         Following commands: Intact       Cueing Cueing Techniques: Verbal cues     General Comments      Exercises     Assessment/Plan    PT Assessment Patient needs continued PT services  PT Problem List Decreased activity tolerance;Decreased mobility;Decreased strength;Decreased skin integrity;Pain       PT Treatment Interventions DME instruction;Gait training;Stair training;Functional mobility training;Therapeutic activities;Patient/family education    PT Goals (Current goals can be found in the Care Plan section)  Acute Rehab PT Goals Patient Stated Goal: return home PT Goal Formulation: With patient Time For Goal Achievement: 03/23/24 Potential to Achieve Goals: Good    Frequency Min 2X/week     Co-evaluation               AM-PAC PT 6 Clicks Mobility  Outcome Measure Help needed turning from your back to your side while in a flat bed without using bedrails?: None Help needed moving from lying on your back to sitting on the side of a flat bed without using bedrails?: None Help needed moving to and from a bed to a chair (including a wheelchair)?: None Help needed standing up from a chair using your arms (e.g., wheelchair or bedside chair)?: None Help needed to walk in hospital room?: A Little Help needed climbing 3-5 steps with a railing? : A Little 6 Click Score: 22    End of Session   Activity Tolerance: Patient limited by pain Patient left: in bed;with call bell/phone within reach Nurse Communication:  Mobility status PT Visit Diagnosis: Other abnormalities of gait and mobility (R26.89);Pain;Difficulty in walking, not elsewhere classified (R26.2) Pain - Right/Left: Left Pain - part of body: Leg    Time: 1042-1100 PT Time Calculation (min) (ACUTE ONLY): 18 min   Charges:   PT Evaluation $PT Eval Low Complexity: 1 Low PT Treatments $Gait Training: 8-22 mins PT General Charges $$ ACUTE PT VISIT: 1 Visit         Kaitlan Bin, PT, [facility] 03/20/24,11:10 AM

## 2024-03-20 NOTE — Progress Notes (Signed)
 SPIRITUAL CARE AND COUNSELING CONSULT NOTE   VISIT SUMMARY Waiting for bypass surgery tomorrow, prayed with patient.    SPIRITUAL ENCOUNTER                                                                                                                                                                      Type of Visit: Initial Care provided to:: Patient Reason for visit: Surgical OnCall Visit: No   SPIRITUAL FRAMEWORK  Presenting Themes: Goals in life/care, Caregiving needs, Rituals and practive, Community and relationships Community/Connection: Family Strengths: Sense of humor and resilience Needs/Challenges/Barriers: need rehab proxity to mother who is 73yo Patient Stress Factors: Health changes   GOALS   Self/Personal Goals: Get through bypass surgery and find rehab close to mom who is 73yo Clinical Care Goals: She may lose toes but hoping bypass will prevent losing leg   INTERVENTIONS   Spiritual Care Interventions Made: Compassionate presence, Reflective listening, Prayer, Encouragement    INTERVENTION OUTCOMES   Outcomes: Awareness of support, Connection to spiritual care, Reduced isolation  SPIRITUAL CARE PLAN   Spiritual Care Issues Still Outstanding: Claudia Mills will continue to follow    If immediate needs arise, please contact [facility] 24 hour on call [phone]   Barnie JINNY Record, Chaplain  03/20/2024 3:39 PM

## 2024-03-20 NOTE — Plan of Care (Signed)

## 2024-03-20 NOTE — Progress Notes (Incomplete)
 PODIATRY: PROGRESS NOTE    Surgery:  Procedures (LRB): Lower Extremity Angiography (Left) PERIPHERAL VASCULAR THROMBECTOMY POD:  1 Day Post-Op  O/N: NAEON  Subjective:  Patient resting comfortably at bedside.  Patient reports pain has improved 'a lot' but still has mild 'tolerable' pain to foot. Would like to review plan from podiatric standpoint.  Denies F/C/N/V/SOB/CP. Denies acute calf pain.    PHYSICAL EXAMINATION: BP 132/67 (BP Location: Right Arm)   Pulse (!) 110   Temp 98.2 F (36.8 C)   Resp 18   Ht 5' 3 (1.6 m)   Wt 59 kg   LMP 09/29/2015 Comment: neg preg test  SpO2 96%   BMI 23.03 kg/m ? GEN: NAD. AOX3. ? RESP: Non-labored breathing on RA.? ABD: NT/ND of all four quadrants.? NEURO: Moving all four extremities spontaneously. ? ? FOCUSED LOWER EXTREMITY EXAMINATION:?  NEURO: ? - SLIT to tib/saph/dp/sp/sural nerve distributions. ? - No paresthesias elicited on examination. ??  VASCULAR: ? - DP/PT - nonpalpable - cannot achieve doppler AT  - Capillary refill <3 seconds. ? - No focal edema noted. ? - Focal edema noted adjacent to surgical site ; as expected for post-operative state. ??  MSK: ? - TTP ***. ? - No*** gross skeletal deformities noted. ? - No calf tenderness. ? - Negative Homan's maneuver.  ??  DERM: ? - Dressings c/d/I. ? - Surgical site well coapted. ? - No clinical signs of dehiscence noted***. ? - Open surgical incision with no purulent / active*** drainage. ? - No proximal streaking. No malodor. ? - No clinical signs of infection noted. ??        Results for orders placed or performed during the hospital encounter of 03/15/24  Culture, blood (Routine x 2)     Status: None   Collection Time: 03/15/24  4:50 PM   Specimen: BLOOD  Result Value Ref Range Status   Specimen Description BLOOD BLOOD LEFT ARM  Final   Special Requests   Final    BOTTLES DRAWN AEROBIC AND ANAEROBIC Blood Culture adequate volume   Culture   Final     NO GROWTH 5 DAYS Performed at [facility], [address] [place], [place] [address]    Report Status 03/20/2024 FINAL  Final  Culture, blood (Routine x 2)     Status: None   Collection Time: 03/15/24  4:51 PM   Specimen: BLOOD  Result Value Ref Range Status   Specimen Description BLOOD LEFT ANTECUBITAL  Final   Special Requests   Final    BOTTLES DRAWN AEROBIC AND ANAEROBIC Blood Culture results may not be optimal due to an inadequate volume of blood received in culture bottles   Culture   Final    NO GROWTH 5 DAYS Performed at [facility], [address], [place], [place] [address]    Report Status 03/20/2024 FINAL  Final  Urine Culture     Status: None   Collection Time: 03/16/24  1:20 PM   Specimen: Urine, Random  Result Value Ref Range Status   Specimen Description   Final    URINE, RANDOM Performed at [facility], [address] [place], [place] [address]    Special Requests   Final    NONE Reflexed from [id number] Performed at [facility], [address] [place], [place] [address]    Culture   Final    NO GROWTH Performed at [facility], [address] [place], [place] [address]  Report Status 03/18/2024 FINAL  Final    * No specimens in log *   ASSESSMENT:?  Claudia Mills is a 54 y.o. female ?***??? #***?  PLAN:? - Activity: WBAT from podiatric standpoint.  - Wound Care: Betadine paint daily - no occlusive dressings.   - ABX: Appreciate assistance with antibiotic stewardship from medicine / pharmacy / ID services.  Anti-infectives (From admission, onward)    Start     Dose/Rate Route Frequency Ordered Stop   03/21/24 0600  ceFAZolin  (ANCEF ) IVPB 2g/100 mL premix        2 g 200 mL/hr over 30 Minutes Intravenous On call to O.R. 03/20/24 1505 03/22/24 0559   03/19/24 1121  ceFAZolin  (ANCEF ) IVPB 2g/100 mL premix        2 g 200 mL/hr over 30 Minutes Intravenous 30 min pre-op 03/19/24 1121 03/19/24 1245    03/16/24 2100  vancomycin  (VANCOREADY) IVPB 1250 mg/250 mL  Status:  Discontinued        1,250 mg 166.7 mL/hr over 90 Minutes Intravenous Every 24 hours 03/15/24 2310 03/18/24 1302   03/16/24 0100  piperacillin -tazobactam (ZOSYN ) IVPB 3.375 g  Status:  Discontinued        3.375 g 12.5 mL/hr over 240 Minutes Intravenous Every 8 hours 03/15/24 2308 03/18/24 1302   03/15/24 2000  vancomycin  (VANCOREADY) IVPB 1250 mg/250 mL        1,250 mg 166.7 mL/hr over 90 Minutes Intravenous  Once 03/15/24 1917 03/15/24 2252   03/15/24 1930  piperacillin -tazobactam (ZOSYN ) IVPB 3.375 g        3.375 g 100 mL/hr over 30 Minutes Intravenous  Once 03/15/24 1917 03/15/24 1954       - Dispo:

## 2024-03-20 NOTE — TOC CM/SW Note (Addendum)
 Transition of Care (TOC) CM/SW Note    Patient is not able to walk the distance required to go the bathroom, or he/she is unable to safely negotiate stairs required to access the bathroom.  A 3in1 BSC will alleviate this problem    The patient has a mobility limitation that significantly impairs their ability to participate in one or more mobility-related activities of daily living in the home. The Patient is able to safety use the walker. The functional mobility deficit can be sufficiently resolved by the use of a walker

## 2024-03-20 NOTE — Progress Notes (Signed)
" ° ° °  PROCEDURAL EXPEDITER PROGRESS NOTE  Patient Name: [name]  DOB:03/09/1970 Date of Admission: 03/15/2024  Date of Assessment:03/20/24   -------------------------------------------------------------------------------------------------------------------   Brief clinical summary: Patient going to OR for femoral to peroneal bypass tomorrow.  Orders in place:   No   Communication with surgical team if no orders: IB MD  Labs, test, and orders reviewed: yes  Requires surgical clearance:   No  What type of clearance: n/a  Clearance received: n/a  Barriers noted:no preop surgical orders   Intervention provided by [facility] team: requested orders  Barrier resolved:   Yes, orders received   -------------------------------------------------------------------------------------------------------------------  [facility] Patient Care Command Expediter, Rexene LITTIE Kirks Please contact us  directly via secure chat (search for [facility]) or by calling us  at [phone] [facility]).  "

## 2024-03-20 NOTE — Consult Note (Signed)
 Pharmacy Consult Note - Anticoagulation  Pharmacy Consult for heparin  Indication: limb ischemia  PATIENT MEASUREMENTS: Height: 5' 3 (160 cm) Weight: 59 kg (130 lb) IBW/kg (Calculated) : 52.4 HEPARIN  DW (KG): 59  VITAL SIGNS: Temp: 98.2 F (36.8 C) (02/04 0759) BP: 110/54 (02/04 0759) Pulse Rate: 97 (02/04 0759)  Recent Labs    03/19/24 1555 03/19/24 2237 03/20/24 0559  HGB  --   --  8.4*  HCT  --   --  26.1*  PLT  --   --  227  HEPARINUNFRC  --    < > 0.40  CREATININE 0.51  --   --    < > = values in this interval not displayed.    Estimated Creatinine Clearance: 67.3 mL/min (by C-G formula based on SCr of 0.51 mg/dL).  PAST MEDICAL HISTORY: Past Medical History:  Diagnosis Date   Coronary artery disease    Diabetes mellitus without complication (HCC)    Hypertension    MI (myocardial infarction) (HCC)    PVD (peripheral vascular disease)    Tick bite    Pt states she does not remember daye of tic bite, but that is was since last visit with pain clinic and she was hospitalized for 1 day.    ASSESSMENT: 54 y.o. female with PMH including T2DM, HLD, CAD/NSTEMI, PAD is presenting with limb ischemia. Patient is not on chronic anticoagulation per chart review. Pharmacy has been consulted to initiate and manage heparin  intravenous infusion.  Pertinent medications: No chronic anticoagulation PTA per chart review Clopidogrel  75mg  daily Aspirin  81mg  daily  Goal(s) of therapy: Heparin  level 0.3 - 0.7 units/mL Monitor platelets by anticoagulation protocol: Yes   Baseline anticoagulation labs: Recent Labs    03/18/24 0405 03/19/24 0508 03/20/24 0559  HGB 9.4* 9.5* 8.4*  PLT 215 235 227  Baseline aPTT also ordered  02/03:  HL @ 0508 = 0.40, therapeutic X 5 02/04 1532 heparin  drip restart 02/04 2237 HL 0.50 therapeutic x1 after restart 02/04 0559 HL 0.40, therapeutic x2    PLAN: - will continue pt on current rate of 1000 units/hr and recheck HL on 2/05 with  AM labs - Monitor CBC daily while on heparin  infusion.  Allean Haas PharmD Clinical Pharmacist 03/20/2024

## 2024-03-20 NOTE — Progress Notes (Signed)
 [facility]  - [place] [facility]   PATIENT NAME: Claudia Mills    MR#:  [id number]  DATE OF BIRTH:  1970/06/28  SUBJECTIVE:  no family at bedside. Continues with heparin  drip. Patient is status post left lower extremity angiogram with angioplasty and stent placement continues with left leg pain. Did work with physical therapy today.  VITALS:  Blood pressure (!) 110/54, pulse 97, temperature 98.2 F (36.8 C), resp. rate 18, height 5' 3 (1.6 m), weight 59 kg, last menstrual period 09/29/2015, SpO2 99%.  PHYSICAL EXAMINATION:   GENERAL:  54 y.o.-year-old patient with no acute distress.  LUNGS: Normal breath sounds bilaterally, no wheezing CARDIOVASCULAR: S1, S2 normal. No murmur   ABDOMEN: Soft, nontender, nondistended. Bowel sounds present.  EXTREMITIES:  NEUROLOGIC: nonfocal  patient is alert and awake    LABORATORY PANEL:  CBC Recent Labs  Lab 03/20/24 0559  WBC 7.4  HGB 8.4*  HCT 26.1*  PLT 227    Chemistries  Recent Labs  Lab 03/15/24 1650 03/18/24 0405 03/18/24 2152 03/19/24 0508 03/19/24 1555  NA 131*  --  136  --   --   K 3.9  --  3.7  --   --   CL 94*  --  101  --   --   CO2 24  --  26  --   --   GLUCOSE 240*  --  121*  --   --   BUN 5*  --  <5*  --  <5*  CREATININE 0.69   < > 0.54   < > 0.51  CALCIUM  10.0  --  8.9  --   --   AST 28  --   --   --   --   ALT 25  --   --   --   --   ALKPHOS 140*  --   --   --   --   BILITOT 0.3  --   --   --   --    < > = values in this interval not displayed.    Assessment and Plan Claudia Mills is a 54 y.o. female with a PMH significant for CAD with history of NSTEMI s/p stent mid LAD, HTN, DM with neuropathy, PAD with prior revascularization 10/2023, currently with dry gangrene of toes left foot, ongoing rest pain,chronic nonhealing surgical wound due to suspected narrowing of prior bypass,  being admitted with suspected cellulitis due to wound infection as well as limb ischemia.   Chest x-ray  nonacute Left foot x-ray showing soft tissue ulceration about the 1st and 2nd toes without evidence of osteomyelitis   Critical limb ischemia of left lower extremity with rest pain (HCC) --Continue heparin  infusion --2/2Vascular surgery tentatively planning an intervention next week -- podiatry input appreciated. Considering left first and second toe amputation wants intervention done by vascular --Per podiatry--dry gangrene--ok to d/c abxs --2/3--left foot angiogram per Dr schnier --2/4-- status post left lower extremity angioplasty with stent placement and left profunda for [name] artery, left external iliac artery and mechanical thrombectomy left external iliac artery -- in discussion with Dr. Jama and Dr. Greig will podiatry the current left lower prendre for today. Change to aspirin  and Plavix  tomorrow. Patient can discharge to home with outpatient follow-up with Dr. Jama in a week. -- Per Dr. Tanda no indication for amputation given PAD worried about stump healing. -- PT started. Patient walked 20 feet with postop boot. Home health will be arranged.  DME ordered.   Nonhealing surgical wound with infection , initial encounter Gangrene toes of left foot PAD s/p bypass 10/2023 with chronic nonhealing surgical wound --Pain improved today --Continue heparin  infusion--change to ASA +plavix  from 2/5 --Pain control   Uncontrolled type 2 diabetes mellitus with hyperglycemia (HCC) --Sliding scale insulin  coverage -- will start patient on glipizide  XL   CAD S/P percutaneous coronary angioplasty --No complaints of chest pain --Continue lisinopril  atorvastatin , aspirin  with nitroglycerin  sublingual as needed   Essential hypertension --Continue lisinopril     Procedures: Family communication :none at bedside Consults :Vascular, podiatry CODE STATUS: full DVT Prophylaxis :heparin  gtt Level of care: Med-Surg Status is: Inpatient Remains inpatient appropriate because: Awaiting  definitive vascular and podiatry plan this  week    TOTAL TIME TAKING CARE OF THIS PATIENT: 40 minutes.  >50% time spent on counselling and coordination of care  Note: This dictation was prepared with Dragon dictation along with smaller phrase technology. Any transcriptional errors that result from this process are unintentional.  [name] M.D    [facility]   CC: Primary care physician; Lorel Maxie LABOR, MD

## 2024-03-20 NOTE — Progress Notes (Signed)
 [facility]  Daily Progress Note   Subjective  -   Patient states that the rest pain is gone but is soon as she tries to walk on her foot it is excruciatingly painful  Objective Vitals:   03/19/24 1926 03/19/24 1935 03/20/24 0521 03/20/24 0759  BP: 139/62 (!) 152/65 121/62 (!) 110/54  Pulse:  (!) 110 (!) 102 97  Resp: 20 18 16 18   Temp: 99.1 F (37.3 C) 98.2 F (36.8 C) 98.4 F (36.9 C) 98.2 F (36.8 C)  TempSrc:      SpO2: 99% 100% 94% 99%  Weight:      Height:        Intake/Output Summary (Last 24 hours) at 03/20/2024 1448 Last data filed at 03/20/2024 1300 Gross per 24 hour  Intake 360 ml  Output --  Net 360 ml    PULM  CTAB CV  RRR VASC  the left lower extremity remains very cold to the touch and is cyanotic.  Dry gangrene of the 1st and 2nd toe is present unchanged she also has gangrenous ulcers of the as well as the above-knee medial thigh area.  These are uninfected as well.  Laboratory CBC    Component Value Date/Time   WBC 7.4 03/20/2024 0559   HGB 8.4 (L) 03/20/2024 0559   HCT 26.1 (L) 03/20/2024 0559   PLT 227 03/20/2024 0559    BMET    Component Value Date/Time   NA 136 03/18/2024 2152   K 3.7 03/18/2024 2152   CL 101 03/18/2024 2152   CO2 26 03/18/2024 2152   GLUCOSE 121 (H) 03/18/2024 2152   BUN <5 (L) 03/19/2024 1555   CREATININE 0.51 03/19/2024 1555   CREATININE 0.58 09/20/2022 0930   CALCIUM  8.9 03/18/2024 2152   GFRNONAA >60 03/19/2024 1555   GFRAA >60 12/30/2015 2048    Assessment/Planning: Atherosclerotic occlusive disease bilateral lower extremities with multiple ulcerations and gangrene of the left lower extremity and foot Although my original intention was to try to delay redo bypass surgery I do not believe that this will have any meaningful benefit.  Although the revascularization yesterday has eliminated her rest pain she can still not ambulate her foot clinically remains severely ischemic and she has  multiple wounds which will not heal.  In particular the calf wound represents as big an issue as any of the others.  Given this I sat with the patient and discussed the options I believe if we are going to save her leg that a redo femoral-peroneal bypass is the only chance.  Further intervention does not have a role in the situation.  I discussed the risks and the dire need to attempt limb salvage.  Patient expresses that she does not wish to lose her leg and that even though she had a very difficult postoperative course last time she is willing to move forward.  The operating room has been able to procure a cadaver vein which apparently are in short supply.  Given this I have recommended that we move forward tomorrow morning 7:30 AM with femoral to peroneal bypass using cadaver vein.  [name]  03/20/2024, 2:48 PM

## 2024-03-20 NOTE — H&P (View-Only) (Signed)
 [facility]  Daily Progress Note   Subjective  -   Patient states that the rest pain is gone but is soon as she tries to walk on her foot it is excruciatingly painful  Objective Vitals:   03/19/24 1926 03/19/24 1935 03/20/24 0521 03/20/24 0759  BP: 139/62 (!) 152/65 121/62 (!) 110/54  Pulse:  (!) 110 (!) 102 97  Resp: 20 18 16 18   Temp: 99.1 F (37.3 C) 98.2 F (36.8 C) 98.4 F (36.9 C) 98.2 F (36.8 C)  TempSrc:      SpO2: 99% 100% 94% 99%  Weight:      Height:        Intake/Output Summary (Last 24 hours) at 03/20/2024 1448 Last data filed at 03/20/2024 1300 Gross per 24 hour  Intake 360 ml  Output --  Net 360 ml    PULM  CTAB CV  RRR VASC  the left lower extremity remains very cold to the touch and is cyanotic.  Dry gangrene of the 1st and 2nd toe is present unchanged she also has gangrenous ulcers of the as well as the above-knee medial thigh area.  These are uninfected as well.  Laboratory CBC    Component Value Date/Time   WBC 7.4 03/20/2024 0559   HGB 8.4 (L) 03/20/2024 0559   HCT 26.1 (L) 03/20/2024 0559   PLT 227 03/20/2024 0559    BMET    Component Value Date/Time   NA 136 03/18/2024 2152   K 3.7 03/18/2024 2152   CL 101 03/18/2024 2152   CO2 26 03/18/2024 2152   GLUCOSE 121 (H) 03/18/2024 2152   BUN <5 (L) 03/19/2024 1555   CREATININE 0.51 03/19/2024 1555   CREATININE 0.58 09/20/2022 0930   CALCIUM  8.9 03/18/2024 2152   GFRNONAA >60 03/19/2024 1555   GFRAA >60 12/30/2015 2048    Assessment/Planning: Atherosclerotic occlusive disease bilateral lower extremities with multiple ulcerations and gangrene of the left lower extremity and foot Although my original intention was to try to delay redo bypass surgery I do not believe that this will have any meaningful benefit.  Although the revascularization yesterday has eliminated her rest pain she can still not ambulate her foot clinically remains severely ischemic and she has  multiple wounds which will not heal.  In particular the calf wound represents as big an issue as any of the others.  Given this I sat with the patient and discussed the options I believe if we are going to save her leg that a redo femoral-peroneal bypass is the only chance.  Further intervention does not have a role in the situation.  I discussed the risks and the dire need to attempt limb salvage.  Patient expresses that she does not wish to lose her leg and that even though she had a very difficult postoperative course last time she is willing to move forward.  The operating room has been able to procure a cadaver vein which apparently are in short supply.  Given this I have recommended that we move forward tomorrow morning 7:30 AM with femoral to peroneal bypass using cadaver vein.  [name]  03/20/2024, 2:48 PM

## 2024-03-20 NOTE — Plan of Care (Signed)
   Problem: Education: Goal: Ability to describe self-care measures that may prevent or decrease complications (Diabetes Survival Skills Education) will improve Outcome: Progressing   Problem: Coping: Goal: Ability to adjust to condition or change in health will improve Outcome: Progressing   Problem: Fluid Volume: Goal: Ability to maintain a balanced intake and output will improve Outcome: Progressing

## 2024-03-20 NOTE — Progress Notes (Signed)
 Last BM on 03/15/24. RN offered if she wanted to have suppository, patient refused stated I normally move bowel once a week Patient educated about risk of getting constipation, patient acknowledge. MD notified. Given PO Miralax  and Dulcolax.

## 2024-03-21 ENCOUNTER — Encounter: Payer: Self-pay | Admitting: Internal Medicine

## 2024-03-21 ENCOUNTER — Encounter: Admission: EM | Payer: Self-pay | Source: Home / Self Care | Attending: Internal Medicine

## 2024-03-21 LAB — CREATININE, SERUM
Creatinine, Ser: 0.51 mg/dL (ref 0.44–1.00)
GFR, Estimated: >60 mL/min

## 2024-03-21 LAB — CBC
HCT: 27.3 % — ABNORMAL LOW (ref 36.0–46.0)
HCT: 24.3 % — ABNORMAL LOW (ref 36.0–46.0)
Hemoglobin: 8.8 g/dL — ABNORMAL LOW (ref 12.0–15.0)
Hemoglobin: 7.7 g/dL — ABNORMAL LOW (ref 12.0–15.0)
MCH: 29.3 pg (ref 26.0–34.0)
MCH: 28.9 pg (ref 26.0–34.0)
MCHC: 32.2 g/dL (ref 30.0–36.0)
MCHC: 31.7 g/dL (ref 30.0–36.0)
MCV: 91 fL (ref 80.0–100.0)
MCV: 91.4 fL (ref 80.0–100.0)
Platelets: 236 10*3/uL (ref 150–400)
Platelets: 209 10*3/uL (ref 150–400)
RBC: 3 MIL/uL — ABNORMAL LOW (ref 3.87–5.11)
RBC: 2.66 MIL/uL — ABNORMAL LOW (ref 3.87–5.11)
RDW: 17.2 % — ABNORMAL HIGH (ref 11.5–15.5)
RDW: 17.4 % — ABNORMAL HIGH (ref 11.5–15.5)
WBC: 7.6 10*3/uL (ref 4.0–10.5)
WBC: 10.9 10*3/uL — ABNORMAL HIGH (ref 4.0–10.5)
nRBC: 0 % (ref 0.0–0.2)
nRBC: 0 % (ref 0.0–0.2)

## 2024-03-21 LAB — GLUCOSE, CAPILLARY
Glucose-Capillary: 162 mg/dL — ABNORMAL HIGH (ref 70–99)
Glucose-Capillary: 230 mg/dL — ABNORMAL HIGH (ref 70–99)
Glucose-Capillary: 217 mg/dL — ABNORMAL HIGH (ref 70–99)
Glucose-Capillary: 239 mg/dL — ABNORMAL HIGH (ref 70–99)

## 2024-03-21 LAB — HEPARIN LEVEL (UNFRACTIONATED): Heparin Unfractionated: 0.38 [IU]/mL (ref 0.30–0.70)

## 2024-03-21 LAB — MRSA NEXT GEN BY PCR, NASAL: MRSA by PCR Next Gen: NOT DETECTED

## 2024-03-21 MED ORDER — CEFAZOLIN SODIUM-DEXTROSE 2-4 GM/100ML-% IV SOLN
2.0000 g | INTRAVENOUS | Status: AC
  Administered 2024-03-21 (×2): 2 g via INTRAVENOUS

## 2024-03-21 MED ORDER — ONDANSETRON HCL 4 MG/2ML IJ SOLN
INTRAMUSCULAR | Status: AC
  Filled 2024-03-21: qty 2

## 2024-03-21 MED ORDER — OXYCODONE HCL 5 MG PO TABS
5.0000 mg | ORAL_TABLET | ORAL | Status: AC | PRN
  Administered 2024-03-21: 10 mg via ORAL
  Administered 2024-03-21: 5 mg via ORAL
  Administered 2024-03-22 (×4): 10 mg via ORAL
  Filled 2024-03-21 (×4): qty 2
  Filled 2024-03-21: qty 1
  Filled 2024-03-21: qty 2

## 2024-03-21 MED ORDER — LABETALOL HCL 5 MG/ML IV SOLN
10.0000 mg | INTRAVENOUS | Status: AC | PRN
  Administered 2024-03-21 (×2): 10 mg via INTRAVENOUS
  Filled 2024-03-21 (×2): qty 4

## 2024-03-21 MED ORDER — CHLORHEXIDINE GLUCONATE CLOTH 2 % EX PADS: 6.0000 | MEDICATED_PAD | Freq: Once | CUTANEOUS | Status: DC

## 2024-03-21 MED ORDER — CLOPIDOGREL BISULFATE 75 MG PO TABS
75.0000 mg | ORAL_TABLET | Freq: Every day | ORAL | Status: AC
  Administered 2024-03-22: 75 mg via ORAL
  Filled 2024-03-21: qty 1

## 2024-03-21 MED ORDER — ACETAMINOPHEN 500 MG PO TABS
1000.0000 mg | ORAL_TABLET | Freq: Once | ORAL | Status: AC
  Administered 2024-03-21: 1000 mg via ORAL

## 2024-03-21 MED ORDER — CELECOXIB 200 MG PO CAPS
200.0000 mg | ORAL_CAPSULE | Freq: Once | ORAL | Status: AC
  Administered 2024-03-21: 200 mg via ORAL

## 2024-03-21 MED ORDER — METOPROLOL TARTRATE 5 MG/5ML IV SOLN: 2.5000 mg | INTRAVENOUS | Status: AC | PRN

## 2024-03-21 MED ORDER — MIDAZOLAM HCL 2 MG/2ML IJ SOLN
INTRAMUSCULAR | Status: AC
  Filled 2024-03-21: qty 2

## 2024-03-21 MED ORDER — SORBITOL 70 % SOLN: 30.0000 mL | Freq: Every day | Status: AC | PRN

## 2024-03-21 MED ORDER — ROCURONIUM BROMIDE 10 MG/ML (PF) SYRINGE
PREFILLED_SYRINGE | INTRAVENOUS | Status: AC
  Filled 2024-03-21: qty 10

## 2024-03-21 MED ORDER — HYDRALAZINE HCL 20 MG/ML IJ SOLN
5.0000 mg | INTRAMUSCULAR | Status: AC | PRN
  Administered 2024-03-21: 5 mg via INTRAVENOUS
  Filled 2024-03-21: qty 1

## 2024-03-21 MED ORDER — MORPHINE SULFATE (PF) 2 MG/ML IV SOLN: 2.0000 mg | INTRAVENOUS | Status: AC | PRN

## 2024-03-21 MED ORDER — SODIUM CHLORIDE 0.9 % IR SOLN
Status: DC | PRN
  Administered 2024-03-21: 3000 mL

## 2024-03-21 MED ORDER — PHENYLEPHRINE 80 MCG/ML (10ML) SYRINGE FOR IV PUSH (FOR BLOOD PRESSURE SUPPORT)
PREFILLED_SYRINGE | INTRAVENOUS | Status: AC
  Filled 2024-03-21: qty 10

## 2024-03-21 MED ORDER — LACTATED RINGERS IR SOLN
Status: DC | PRN
  Administered 2024-03-21: 1000 mL

## 2024-03-21 MED ORDER — ONDANSETRON HCL 4 MG/2ML IJ SOLN
INTRAMUSCULAR | Status: DC | PRN
  Administered 2024-03-21 (×2): 4 mg via INTRAVENOUS

## 2024-03-21 MED ORDER — HEPARIN SODIUM (PORCINE) 1000 UNIT/ML IJ SOLN
INTRAMUSCULAR | Status: AC
  Filled 2024-03-21: qty 10

## 2024-03-21 MED ORDER — OXYCODONE HCL 5 MG/5ML PO SOLN: 5.0000 mg | Freq: Once | ORAL | Status: DC | PRN

## 2024-03-21 MED ORDER — FENTANYL CITRATE (PF) 100 MCG/2ML IJ SOLN
INTRAMUSCULAR | Status: DC | PRN
  Administered 2024-03-21: 25 ug via INTRAVENOUS
  Administered 2024-03-21: 50 ug via INTRAVENOUS
  Administered 2024-03-21: 25 ug via INTRAVENOUS
  Administered 2024-03-21 (×2): 50 ug via INTRAVENOUS

## 2024-03-21 MED ORDER — PHENYLEPHRINE 80 MCG/ML (10ML) SYRINGE FOR IV PUSH (FOR BLOOD PRESSURE SUPPORT)
PREFILLED_SYRINGE | INTRAVENOUS | Status: DC | PRN
  Administered 2024-03-21 (×9): 80 ug via INTRAVENOUS

## 2024-03-21 MED ORDER — ACETAMINOPHEN 650 MG RE SUPP: 325.0000 mg | RECTAL | Status: AC | PRN

## 2024-03-21 MED ORDER — DEXMEDETOMIDINE HCL IN NACL 80 MCG/20ML IV SOLN
INTRAVENOUS | Status: DC | PRN
  Administered 2024-03-21: 4 ug via INTRAVENOUS

## 2024-03-21 MED ORDER — DEXMEDETOMIDINE HCL IN NACL 80 MCG/20ML IV SOLN
INTRAVENOUS | Status: AC
  Filled 2024-03-21: qty 20

## 2024-03-21 MED ORDER — GABAPENTIN 300 MG PO CAPS
ORAL_CAPSULE | ORAL | Status: AC
  Filled 2024-03-21: qty 1

## 2024-03-21 MED ORDER — CEFAZOLIN SODIUM-DEXTROSE 2-4 GM/100ML-% IV SOLN
2.0000 g | Freq: Three times a day (TID) | INTRAVENOUS | Status: AC
  Administered 2024-03-21 – 2024-03-22 (×2): 2 g via INTRAVENOUS
  Filled 2024-03-21 (×3): qty 100

## 2024-03-21 MED ORDER — LIDOCAINE HCL (CARDIAC) PF 100 MG/5ML IV SOSY
PREFILLED_SYRINGE | INTRAVENOUS | Status: DC | PRN
  Administered 2024-03-21 (×2): 100 mg via INTRAVENOUS

## 2024-03-21 MED ORDER — OXYCODONE HCL 5 MG PO TABS: 5.0000 mg | ORAL_TABLET | Freq: Once | ORAL | Status: DC | PRN

## 2024-03-21 MED ORDER — MIDAZOLAM HCL (PF) 2 MG/2ML IJ SOLN
INTRAMUSCULAR | Status: DC | PRN
  Administered 2024-03-21: 2 mg via INTRAVENOUS

## 2024-03-21 MED ORDER — ACETAMINOPHEN 10 MG/ML IV SOLN
INTRAVENOUS | Status: AC
  Filled 2024-03-21: qty 100

## 2024-03-21 MED ORDER — ENOXAPARIN SODIUM 40 MG/0.4ML IJ SOSY
40.0000 mg | PREFILLED_SYRINGE | INTRAMUSCULAR | Status: AC
  Administered 2024-03-22: 40 mg via SUBCUTANEOUS
  Filled 2024-03-21: qty 0.4

## 2024-03-21 MED ORDER — DEXAMETHASONE SOD PHOSPHATE PF 10 MG/ML IJ SOLN
INTRAMUSCULAR | Status: AC
  Filled 2024-03-21: qty 1

## 2024-03-21 MED ORDER — HEPARIN SODIUM (PORCINE) 10000 UNIT/ML IJ SOLN
INTRAMUSCULAR | Status: AC
  Filled 2024-03-21: qty 1

## 2024-03-21 MED ORDER — HEPARIN SODIUM (PORCINE) 5000 UNIT/ML IJ SOLN
INTRAMUSCULAR | Status: AC
  Filled 2024-03-21: qty 1

## 2024-03-21 MED ORDER — CEFAZOLIN SODIUM 1 G IJ SOLR
INTRAMUSCULAR | Status: AC
  Filled 2024-03-21: qty 20

## 2024-03-21 MED ORDER — SODIUM CHLORIDE 0.9 % IV SOLN: INTRAVENOUS | Status: DC | PRN

## 2024-03-21 MED ORDER — FENTANYL CITRATE (PF) 100 MCG/2ML IJ SOLN
INTRAMUSCULAR | Status: AC
  Filled 2024-03-21: qty 2

## 2024-03-21 MED ORDER — SENNA 8.6 MG PO TABS
1.0000 | ORAL_TABLET | Freq: Every day | ORAL | Status: AC
  Filled 2024-03-21: qty 1

## 2024-03-21 MED ORDER — INSULIN ASPART 100 UNIT/ML IJ SOLN
INTRAMUSCULAR | Status: AC
  Filled 2024-03-21: qty 5

## 2024-03-21 MED ORDER — SUGAMMADEX SODIUM 200 MG/2ML IV SOLN
INTRAVENOUS | Status: DC | PRN
  Administered 2024-03-21: 200 mg via INTRAVENOUS

## 2024-03-21 MED ORDER — HEPARIN 30,000 UNITS/1000 ML (OHS) CELLSAVER SOLUTION
Status: AC
  Filled 2024-03-21: qty 1000

## 2024-03-21 MED ORDER — PROPOFOL 10 MG/ML IV BOLUS
INTRAVENOUS | Status: DC | PRN
  Administered 2024-03-21: 140 mg via INTRAVENOUS

## 2024-03-21 MED ORDER — HEPARIN SODIUM (PORCINE) 1000 UNIT/ML IJ SOLN
INTRAMUSCULAR | Status: DC | PRN
  Administered 2024-03-21: 7000 [IU] via INTRAVENOUS
  Administered 2024-03-21: 2000 [IU] via INTRAVENOUS
  Administered 2024-03-21: 1000 [IU] via INTRAVENOUS

## 2024-03-21 MED ORDER — GABAPENTIN 300 MG PO CAPS
300.0000 mg | ORAL_CAPSULE | Freq: Once | ORAL | Status: AC
  Administered 2024-03-21: 300 mg via ORAL

## 2024-03-21 MED ORDER — ACETAMINOPHEN 325 MG PO TABS
325.0000 mg | ORAL_TABLET | ORAL | Status: AC | PRN
  Administered 2024-03-21: 650 mg via ORAL
  Filled 2024-03-21: qty 2

## 2024-03-21 MED ORDER — DROPERIDOL 2.5 MG/ML IJ SOLN: 0.6250 mg | Freq: Once | INTRAMUSCULAR | Status: DC | PRN

## 2024-03-21 MED ORDER — ROCURONIUM BROMIDE 100 MG/10ML IV SOLN
INTRAVENOUS | Status: DC | PRN
  Administered 2024-03-21 (×2): 15 mg via INTRAVENOUS
  Administered 2024-03-21: 10 mg via INTRAVENOUS
  Administered 2024-03-21: 60 mg via INTRAVENOUS

## 2024-03-21 MED ORDER — ACETAMINOPHEN 10 MG/ML IV SOLN: 1000.0000 mg | Freq: Once | INTRAVENOUS | Status: DC | PRN

## 2024-03-21 MED ORDER — NITROGLYCERIN IN D5W 200-5 MCG/ML-% IV SOLN: 5.0000 ug/min | INTRAVENOUS | Status: AC

## 2024-03-21 MED ORDER — CLOPIDOGREL BISULFATE 75 MG PO TABS
150.0000 mg | ORAL_TABLET | Freq: Once | ORAL | Status: AC
  Administered 2024-03-21: 150 mg via ORAL
  Filled 2024-03-21: qty 2

## 2024-03-21 MED ORDER — INSULIN ASPART 100 UNIT/ML IJ SOLN
5.0000 [IU] | Freq: Once | INTRAMUSCULAR | Status: AC
  Administered 2024-03-21: 5 [IU] via SUBCUTANEOUS

## 2024-03-21 MED ORDER — VANCOMYCIN HCL 1000 MG IV SOLR
INTRAVENOUS | Status: AC
  Filled 2024-03-21: qty 20

## 2024-03-21 MED ORDER — SODIUM CHLORIDE 0.9 % IV SOLN: 500.0000 mL | Freq: Once | INTRAVENOUS | Status: AC | PRN

## 2024-03-21 MED ORDER — ACETAMINOPHEN 10 MG/ML IV SOLN
INTRAVENOUS | Status: DC | PRN
  Administered 2024-03-21: 1000 mg via INTRAVENOUS

## 2024-03-21 MED ORDER — ONDANSETRON HCL 4 MG/2ML IJ SOLN: 4.0000 mg | Freq: Four times a day (QID) | INTRAMUSCULAR | Status: AC | PRN

## 2024-03-21 MED ORDER — LIDOCAINE HCL (PF) 2 % IJ SOLN
INTRAMUSCULAR | Status: AC
  Filled 2024-03-21: qty 5

## 2024-03-21 MED ORDER — POTASSIUM CHLORIDE CRYS ER 20 MEQ PO TBCR: 40.0000 meq | EXTENDED_RELEASE_TABLET | Freq: Every day | ORAL | Status: AC | PRN

## 2024-03-21 MED ORDER — CEFAZOLIN SODIUM-DEXTROSE 2-4 GM/100ML-% IV SOLN
INTRAVENOUS | Status: AC
  Filled 2024-03-21: qty 100

## 2024-03-21 MED ORDER — ACETAMINOPHEN 500 MG PO TABS
ORAL_TABLET | ORAL | Status: AC
  Filled 2024-03-21: qty 2

## 2024-03-21 MED ORDER — FENTANYL CITRATE (PF) 100 MCG/2ML IJ SOLN: 25.0000 ug | INTRAMUSCULAR | Status: DC | PRN

## 2024-03-21 MED ORDER — CELECOXIB 200 MG PO CAPS
ORAL_CAPSULE | ORAL | Status: AC
  Filled 2024-03-21: qty 1

## 2024-03-21 MED ORDER — PHENYLEPHRINE HCL-NACL 20-0.9 MG/250ML-% IV SOLN
INTRAVENOUS | Status: AC
  Filled 2024-03-21: qty 250

## 2024-03-21 MED ORDER — VASHE WOUND IRRIGATION OPTIME
TOPICAL | Status: DC | PRN
  Administered 2024-03-21: 34 [oz_av] via TOPICAL

## 2024-03-21 MED ORDER — EPHEDRINE SULFATE-NACL 50-0.9 MG/10ML-% IV SOSY
PREFILLED_SYRINGE | INTRAVENOUS | Status: DC | PRN
  Administered 2024-03-21 (×3): 5 mg via INTRAVENOUS

## 2024-03-21 MED ORDER — GENTAMICIN SULFATE 40 MG/ML IJ SOLN
INTRAMUSCULAR | Status: DC | PRN
  Administered 2024-03-21: 160 mg

## 2024-03-21 MED ORDER — PROPOFOL 10 MG/ML IV BOLUS
INTRAVENOUS | Status: AC
  Filled 2024-03-21: qty 20

## 2024-03-21 MED ORDER — ESMOLOL HCL 100 MG/10ML IV SOLN
INTRAVENOUS | Status: AC
  Filled 2024-03-21: qty 10

## 2024-03-21 MED ORDER — HEMOSTATIC AGENTS (NO CHARGE) OPTIME
TOPICAL | Status: DC | PRN
  Administered 2024-03-21: 1 via TOPICAL

## 2024-03-21 MED ORDER — EPHEDRINE 5 MG/ML INJ
INTRAVENOUS | Status: AC
  Filled 2024-03-21: qty 5

## 2024-03-21 MED ORDER — VANCOMYCIN HCL 1 G IV SOLR
INTRAVENOUS | Status: DC | PRN
  Administered 2024-03-21: 1000 mg

## 2024-03-21 MED ORDER — FENTANYL CITRATE (PF) 100 MCG/2ML IJ SOLN
25.0000 ug | INTRAMUSCULAR | Status: DC | PRN
  Administered 2024-03-21: 25 ug via INTRAVENOUS

## 2024-03-21 MED ORDER — GENTAMICIN SULFATE 40 MG/ML IJ SOLN
INTRAMUSCULAR | Status: AC
  Filled 2024-03-21: qty 4

## 2024-03-21 MED ORDER — DEXAMETHASONE SOD PHOSPHATE PF 10 MG/ML IJ SOLN
INTRAMUSCULAR | Status: DC | PRN
  Administered 2024-03-21: 10 mg via INTRAVENOUS

## 2024-03-21 MED ORDER — PHENYLEPHRINE HCL-NACL 20-0.9 MG/250ML-% IV SOLN
INTRAVENOUS | Status: DC | PRN
  Administered 2024-03-21: 20 ug/min via INTRAVENOUS

## 2024-03-21 MED ORDER — PHENOL 1.4 % MT LIQD: 1.0000 | OROMUCOSAL | Status: AC | PRN

## 2024-03-21 NOTE — Op Note (Signed)
 [facility]    OPERATIVE NOTE   PROCEDURE: left common femoral artery to distal peroneal artery bypass with cryopreserved great saphenous vein Separate left profunda femoris endarterectomy 3.   Redo (multiple time) vascular surgery with redo exposures at both locations 4.   Placement of antibiotic impregnated beads into both incisions  PRE-OPERATIVE DIAGNOSIS: Atherosclerotic Occlusive Disease with gangrene LLE  POST-OPERATIVE DIAGNOSIS:  Atherosclerotic Occlusive Disease with gangrene LLE  SURGEONS: Selinda Gu, MD and [name], MD - cosurgeons  ASSISTANT(S): none  ANESTHESIA: general  ESTIMATED BLOOD LOSS: 550 cc  FINDING(S): None   SPECIMEN(S):  left profunda femorus plaque  INDICATIONS:   Claudia Mills is a 54 y.o. female who presents with recurrent ischemia of the left lower extremity despite multiple previous interventions and surgeries now with gangrenous changes to her left foot. The risk, benefits, and alternative for bypass operations were discussed with the patient.  The patient is aware the risks include but are not limited to: bleeding, infection, myocardial infarction, stroke, limb loss, nerve damage, need for additional procedures in the future, wound complications, and inability to complete the bypass.  The patient is voices understanding of these risks and agreed to proceed.  DESCRIPTION: After informed consent was obtained, the patient was brought back to the operating room and placed in the supine position.  Prior to induction, the patient was given intravenous antibiotics.  After general anesthesia is induced, the patient was prepped and draped in the standard fashion for a femoral to popliteal bypass operation.  Appropriate timeout is called.  Co-surgeons are required because this is a complex multilevel procedure with work being performed simultaneously from both the femoral and the tibial incision.  This also expedites the procedure making  a shorter operative time reducing complications and improving patient safety.   Attention was turned to the left groin.  A longitudinal incision was made over the left common femoral artery.  Using blunt dissection and electrocautery, the artery was dissected circumferentially from the inguinal ligament down to the femoral bifurcation.  The superficial femoral artery, primary profunda femoral artery branches, and proximal common femoral artery are looped with Silastic vessel loops.  There were 2 previous surgical bypasses that were identified as well.  This was a very tedious and difficult dissection due to the multiple time reoperative surgery in the groin.   Medial incision was then made in the distal calf and the peroneal artery was identified. It was dissected circumferentially and looped with Silastic vessel loops.  This was also a tedious dissection due to her previous femoral to distal bypass and the wound and scar in the proximal to mid medial calf.  The peroneal artery was the only distal target.  It was quite small but our only option for bypass target.  A tunneler was then passed and a cryopreserved great saphenous vein graft was then pulled through the tunnel. The distal end was approximated to the tibial artery.  The patient was given 7000 units of Heparin  intravenously, and this is allowed to circulate for proximally 5 minutes.  The common femoral artery and profunda femoral artery were then clamped along with any circumflex branches.  Arteriotomy is then made in the common femoral artery with an 11 blade and extended with Potts scissors. Arteriotomy is extended into the what was likely the hood of the superficial femoral artery at the location of the previous bypass graft with PTFE.  This allowed visualization of the profunda femoris artery which had significant disease  proximally.  Profunda femoris endarterectomy was then performed under direct visualization beginning at the origin and going  down about 2 to 3 cm with a large amount of plaque removed.  This was separate and distinct to the common femoral artery bypass site and necessary to provide collateral blood flow knowing that the bypass durability is suboptimal with a patient with such extensive and severe longstanding peripheral arterial disease.  When the proximal portion of the profunda femoris artery plaque was removed, there was excellent backbleeding and good flow in the origin demonstrating a successful endarterectomy of the profunda femoris artery.  The proximal extent of the bypass vein was trimmed to the appropriate shape.  The Cryopreserved saphenous vein graft was sewn to the common femoral artery in an end-to-side configuration with a running stitch of 5-0 Prolene suture.  Prior to completing the suture line the anastomosis is flushed and subsequently the suture line is completed. Flow was then reestablished to the profunda femoris artery.  The anastomosis is checked for leaks and the cryopreserved vein graft is clamped proximally.  Subsequently, dry gauze with pressure was placed around the suture line. Attention was then turned to the distal end of the graft.  We released the clamp and pulsatile bleeding from the graft was evident.  We reclamped the graft near the arterial anastomosis.  The conduit was irrigated with heparinized saline.    Attention was then turned to the tibial target site. The leg was straightened and final measurements were made. Arteriotomy was made with a 11-blade and extended with Potts scissors.  The distal end of the conduit was spatulated to the appropriate length.  The cryopreserved saphenous vein was then sewn to the to the artery in an end-to-side configuration with a running stitch of 6-0 Prolene.  Prior to completing the anastomosis, flushing maneuvers were performed and then flow was established distally.  After inspecting the anastomosis for hemostasis, gauze was placed around the suture line.    The wounds were then irrigated with 1 liter of Vashe irrigation.  The wounds were then inspected for hemostasis. Bleeding points were controlled with electrocautery, and suture repair of active bleeding points.  Fibrillar and Hemoblast were then placed in the bed of the wounds. Once hemostasis was achieved.  We then placed 1 g of vancomycin  and 160 mg of gentamicin  impregnated Stimulan beads in both wounds.  The groin incision was then closed in multiple layers using both 2-0 and 3-0 Vicryl in running fashion. Skin was closed with a staples. The medial calf was then closed layers using  3-0 Vicryl, 4-0 Nylon, and staples.  It should be noted that after the deep layers were closed in each wound, Celerate was used to help promote healing.  A sterile dressing was applied to each incision.   COMPLICATIONS: none  CONDITION: stable  Selinda Gu, M.D. [facility] Office: [phone]  03/21/2024, 1:11 PM

## 2024-03-21 NOTE — Transfer of Care (Signed)
 Immediate Anesthesia Transfer of Care Note  Patient: Claudia Mills  Procedure(s) Performed: CREATION, BYPASS, ARTERIAL, FEMORAL TO PERONEAL, USING GRAFT (Left) APPLICATION OF CELL SAVER ENDARTERECTOMY, FEMORAL  Patient Location: PACU  Anesthesia Type:General  Level of Consciousness: awake and drowsy  Airway & Oxygen Therapy: Patient Spontanous Breathing and Patient connected to face mask oxygen  Post-op Assessment: Report given to RN and Post -op Vital signs reviewed and stable  Post vital signs: Reviewed and stable  Last Vitals:  Vitals Value Taken Time  BP 119/61 03/21/24 13:17  Temp    Pulse 106 03/21/24 13:25  Resp 15 03/21/24 13:25  SpO2 100 % 03/21/24 13:25  Vitals shown include unfiled device data.  Last Pain:  Vitals:   03/21/24 0704  TempSrc: Temporal  PainSc: 1       Patients Stated Pain Goal: 3 (03/15/24 2000)  Complications: No notable events documented.

## 2024-03-21 NOTE — Plan of Care (Signed)
  Problem: Education: Goal: Ability to describe self-care measures that may prevent or decrease complications (Diabetes Survival Skills Education) will improve Outcome: Progressing Goal: Individualized Educational Video(s) Outcome: Progressing   Problem: Coping: Goal: Ability to adjust to condition or change in health will improve Outcome: Progressing   Problem: Fluid Volume: Goal: Ability to maintain a balanced intake and output will improve Outcome: Progressing   Problem: Health Behavior/Discharge Planning: Goal: Ability to identify and utilize available resources and services will improve Outcome: Progressing Goal: Ability to manage health-related needs will improve Outcome: Progressing   Problem: Metabolic: Goal: Ability to maintain appropriate glucose levels will improve Outcome: Progressing   Problem: Nutritional: Goal: Maintenance of adequate nutrition will improve Outcome: Progressing Goal: Progress toward achieving an optimal weight will improve Outcome: Progressing   Problem: Skin Integrity: Goal: Risk for impaired skin integrity will decrease Outcome: Progressing   Problem: Tissue Perfusion: Goal: Adequacy of tissue perfusion will improve Outcome: Progressing   Problem: Education: Goal: Knowledge of General Education information will improve Description: Including pain rating scale, medication(s)/side effects and non-pharmacologic comfort measures Outcome: Progressing   Problem: Health Behavior/Discharge Planning: Goal: Ability to manage health-related needs will improve Outcome: Progressing   Problem: Clinical Measurements: Goal: Ability to maintain clinical measurements within normal limits will improve Outcome: Progressing Goal: Will remain free from infection Outcome: Progressing Goal: Diagnostic test results will improve Outcome: Progressing Goal: Respiratory complications will improve Outcome: Progressing Goal: Cardiovascular complication will  be avoided Outcome: Progressing   Problem: Activity: Goal: Risk for activity intolerance will decrease Outcome: Progressing   Problem: Nutrition: Goal: Adequate nutrition will be maintained Outcome: Progressing   Problem: Coping: Goal: Level of anxiety will decrease Outcome: Progressing   Problem: Elimination: Goal: Will not experience complications related to bowel motility Outcome: Progressing Goal: Will not experience complications related to urinary retention Outcome: Progressing   Problem: Pain Managment: Goal: General experience of comfort will improve and/or be controlled Outcome: Progressing   Problem: Safety: Goal: Ability to remain free from injury will improve Outcome: Progressing   Problem: Skin Integrity: Goal: Risk for impaired skin integrity will decrease Outcome: Progressing   Problem: Education: Goal: Understanding of CV disease, CV risk reduction, and recovery process will improve Outcome: Progressing Goal: Individualized Educational Video(s) Outcome: Progressing   Problem: Activity: Goal: Ability to return to baseline activity level will improve Outcome: Progressing   Problem: Cardiovascular: Goal: Ability to achieve and maintain adequate cardiovascular perfusion will improve Outcome: Progressing Goal: Vascular access site(s) Level 0-1 will be maintained Outcome: Progressing

## 2024-03-21 NOTE — Plan of Care (Signed)

## 2024-03-21 NOTE — Progress Notes (Signed)
 [facility]  - [place] [facility]   PATIENT NAME: Claudia Mills    MR#:  [id number]  DATE OF BIRTH:  1970-12-01  SUBJECTIVE:  Pt seen in PACU after surgery today.  RN at bedside reports pt doing well, no complications or issues since out of OR.  Pt sedated but responds to stimulus.  VITALS:  Blood pressure 126/63, pulse (!) 101, temperature 97.9 F (36.6 C), temperature source Oral, resp. rate 15, height 5' 3 (1.6 m), weight 59.5 kg, last menstrual period 09/29/2015, SpO2 98%.  PHYSICAL EXAMINATION:   General exam: sedated responds to stimulation, no acute distress HEENT: eyes closed, atrumatic  Respiratory system: CTAB, no wheezes, rales or rhonchi, normal respiratory effort. Cardiovascular system: normal S1/S2, RRR  Gastrointestinal system: soft, NT, ND Central nervous system: exam limited as pt sedated post-operatively Extremities: left surgical sites with intact dressings, doppler pulse sites marked on left foot, pulses not palpable Skin: dry, intact, normal temperature     LABORATORY PANEL:  CBC Recent Labs  Lab 03/21/24 1635  WBC 10.9*  HGB 7.7*  HCT 24.3*  PLT 209    Chemistries  Recent Labs  Lab 03/15/24 1650 03/18/24 0405 03/18/24 2152 03/19/24 0508 03/19/24 1555 03/21/24 1635  NA 131*  --  136  --   --   --   K 3.9  --  3.7  --   --   --   CL 94*  --  101  --   --   --   CO2 24  --  26  --   --   --   GLUCOSE 240*  --  121*  --   --   --   BUN 5*  --  <5*  --  <5*  --   CREATININE 0.69   < > 0.54   < > 0.51 0.51  CALCIUM  10.0  --  8.9  --   --   --   AST 28  --   --   --   --   --   ALT 25  --   --   --   --   --   ALKPHOS 140*  --   --   --   --   --   BILITOT 0.3  --   --   --   --   --    < > = values in this interval not displayed.    Assessment and Plan Bethanne Mule is a 54 y.o. female with a PMH significant for CAD with history of NSTEMI s/p stent mid LAD, HTN, DM with neuropathy, PAD with prior revascularization  10/2023, currently with dry gangrene of toes left foot, ongoing rest pain,chronic nonhealing surgical wound due to suspected narrowing of prior bypass,  being admitted with suspected cellulitis due to wound infection as well as limb ischemia.   Chest x-ray nonacute Left foot x-ray showing soft tissue ulceration about the 1st and 2nd toes without evidence of osteomyelitis   Critical limb ischemia of left lower extremity with rest pain (HCC) --Continue heparin  infusion --2/2Vascular surgery tentatively planning an intervention next week -- podiatry input appreciated. Considering left first and second toe amputation wants intervention done by vascular --Per podiatry--dry gangrene--ok to d/c abxs --2/3--left foot angiogram per Dr schnier --2/4-- status post left lower extremity angioplasty with stent placement and left profunda for [name] artery, left external iliac artery and mechanical thrombectomy left external iliac artery --2/5-- pt underwent femoral-peroneal bypass  today with Drs Jama and Marea --Changed to aspirin  and Plavix  today -- Per Dr. Tanda Podiatry no indication for amputation given PAD worried about stump healing. -- PT started. Patient walked 20 feet with postop boot. -- Home health will be arranged. DME ordered.   Nonhealing surgical wound with infection , initial encounter Gangrene toes of left foot PAD s/p bypass 10/2023 with chronic nonhealing surgical wound --anticoagulation / antiplatelets per vascular sx --now off heparin  infusion  --changed to ASA +plavix  from 2/6 --Pain control   Uncontrolled type 2 diabetes mellitus with hyperglycemia (HCC) --Sliding scale insulin  coverage -- start patient on glipizide  XL on dc or once no further procedures   CAD S/P percutaneous coronary angioplasty --No complaints of chest pain --Continue lisinopril  atorvastatin , aspirin  with nitroglycerin  sublingual as needed   Essential hypertension --Continue lisinopril      Procedures: Family communication :none at bedside Consults :Vascular, podiatry CODE STATUS: full DVT Prophylaxis: heparin  gtt   Level of care: ICU Status is: Inpatient Remains inpatient appropriate because: Awaiting definitive vascular and podiatry plan this  week    TOTAL TIME TAKING CARE OF THIS PATIENT: 40 minutes.  >50% time spent on counselling and coordination of care  Note: This dictation was prepared with Dragon dictation along with smaller phrase technology. Any transcriptional errors that result from this process are unintentional.  Burnard DELENA Cunning, DO   [facility]   CC: Primary care physician; Lorel Maxie DELENA, MD

## 2024-03-21 NOTE — Anesthesia Procedure Notes (Addendum)
 Procedure Name: Intubation Date/Time: 03/21/2024 7:44 AM  Performed by: Trudy Rankin LABOR, CRNAPre-anesthesia Checklist: Patient identified, Patient being monitored, Timeout performed, Emergency Drugs available and Suction available Patient Re-evaluated:Patient Re-evaluated prior to induction Oxygen Delivery Method: Circle System Utilized Preoxygenation: Pre-oxygenation with 100% oxygen Induction Type: IV induction and Rapid sequence Laryngoscope Size: Mac, 3 and McGrath Grade View: Grade I Tube type: Oral Tube size: 7.5 mm Number of attempts: 1 Airway Equipment and Method: Stylet and Video-laryngoscopy Placement Confirmation: ETT inserted through vocal cords under direct vision, positive ETCO2 and breath sounds checked- equal and bilateral Secured at: 20 cm Tube secured with: Tape Dental Injury: Teeth and Oropharynx as per pre-operative assessment

## 2024-03-21 NOTE — Anesthesia Procedure Notes (Deleted)
 Arterial Line Insertion Start/End2/06/2024 7:52 AM, 03/21/2024 7:57 AM Performed by: Vicci Camellia Glatter, MD, Trudy Rankin LABOR, CRNA, CRNA  Patient location: OR. Preanesthetic checklist: patient identified, IV checked, site marked, risks and benefits discussed, surgical consent, monitors and equipment checked, pre-op evaluation, timeout performed and anesthesia consent Lidocaine  1% used for infiltration Right, radial was placed Catheter size: 20 G Hand hygiene performed  and maximum sterile barriers used  Allen's test indicative of satisfactory collateral circulation Attempts: 2 Procedure performed using ultrasound to evaluate access site. Ultrasound Notes:relevant anatomy identified, ultrasound used to visualize needle entry and vessel patent under ultrasound. Following insertion, dressing applied and Biopatch. Post procedure assessment: normal and unchanged  Patient tolerated the procedure well with no immediate complications.

## 2024-03-21 NOTE — Interval H&P Note (Signed)
 History and Physical Interval Note:  03/21/2024 7:25 AM  Claudia Mills  has presented today for surgery, with the diagnosis of Left lower critical limb ischemia.  The various methods of treatment have been discussed with the patient and family. After consideration of risks, benefits and other options for treatment, the patient has consented to  Procedures with comments: CREATION, BYPASS, ARTERIAL, FEMORAL TO PERONEAL, USING GRAFT (Left) - Redo with Cryovein APPLICATION OF CELL SAVER (N/A) as a surgical intervention.  The patient's history has been reviewed, patient examined, no change in status, stable for surgery.  I have reviewed the patient's chart and labs.  Questions were answered to the patient's satisfaction.     [name]

## 2024-03-21 NOTE — Anesthesia Procedure Notes (Signed)
 Arterial Line Insertion Start/End2/06/2024 7:40 AM, 03/21/2024 7:56 AM Performed by: Vicci Camellia Glatter, MD, Trudy Rankin LABOR, CRNA, CRNA  Patient location: Pre-op. Preanesthetic checklist: patient identified, IV checked, site marked, risks and benefits discussed, surgical consent, monitors and equipment checked, pre-op evaluation, timeout performed and anesthesia consent Patient sedated Right, radial was placed Catheter size: 20 G Hand hygiene performed  and maximum sterile barriers used   Attempts: 2 Procedure performed using ultrasound to evaluate access site. Ultrasound Notes:relevant anatomy identified, ultrasound used to visualize needle entry and vessel patent under ultrasound. Following insertion, dressing applied and Biopatch. Post procedure assessment: normal and unchanged  Post procedure complications: local hematoma. Additional procedure comments: First attempt by CRNA without ultrasound and second attempt with ultrasound. Distal hematoma noted on ultrasound imaging. Third attempt by MD with ultrasound with entry proximal to hematoma. No flash in catheter but needle tip was visualized and catheter threaded with no resistance. Return of blood flow confirmed. SABRA

## 2024-03-21 NOTE — Progress Notes (Signed)
 PT Cancellation Note  Patient Details Name: Claudia Mills MRN: [id number] DOB: 02-03-1971   Cancelled Treatment:    Reason Eval/Treat Not Completed: Patient not medically ready.  Pt in PACU currently and will have new orders for PT following recovery and transfer to ICU.  Hold until new orders have been placed.     Fonda Simpers, PT, DPT Physical Therapist - [facility]  03/21/24, 2:28 PM

## 2024-03-21 NOTE — Op Note (Signed)
 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 Critical limb ischemia left lower extremity  POST-OPERATIVE DIAGNOSIS:  Same   CO-SURGEONS: Cordella KANDICE Shawl, M.D. and Selinda GORMAN Gu, MD  ASSISTANT(S): None  ANESTHESIA: general  ESTIMATED BLOOD LOSS: 550 cc  FINDING(S): None   SPECIMEN(S): Plaque from the left profunda femoris  INDICATIONS:   Claudia Mills is a 54 y.o. female who presents with critical limb ischemia and gangrenous changes to the left 1st and 2nd toes. The risk, benefits, and alternative for bypass operations were discussed with the patient.  The patient is aware the risks include but are not limited to: bleeding, infection, myocardial infarction, stroke, limb loss, nerve damage, need for additional procedures in the future, wound complications, and inability to complete the bypass.  The patient is voices understanding of these risks and agreed to proceed.  DESCRIPTION: After informed consent was obtained, the patient was brought back to the operating room and placed in the supine position.  Prior to induction, the patient was given intravenous antibiotics.  After general anesthesia is induced, the patient was prepped and draped in the standard fashion for a femoral to popliteal bypass operation.  Appropriate timeout is called.  Co-surgeons are required because this is a complex multilevel procedure with work being performed simultaneously from both the patient's right and left sides.  This also expedites the procedure making a shorter operative time reducing complications and improving patient safety.   Attention was turned  to the left groin.  A longitudinal incision was made over the left common femoral artery.  Using blunt dissection and electrocautery, the artery was dissected circumferentially from the inguinal ligament down to the femoral bifurcation.  The dissection was incredibly tedious secondary to profound scar tissue.  This was some of the most dense adherent scarring that I have seen and essentially obliterated most of the anatomic planes.  Two previous bypass grafts were encountered as well adding to the complexity of the dissection.  The superficial femoral artery, profunda femoral artery, and proximal left common femoral artery are looped with Silastic vessel loops.  Circumflex branches were also dissected and controlled with vessel loops as needed.     Medial incision was then made in the calf distal to the previous incision and the peroneal artery was identified. It was dissected circumferentially and looped with Silastic vessel loops.  The cadaver vein was then prepared on the back table.  A tunneler was then passed and a cadaver vein was marked with a racing stripe.  It was then pulled through the tunnel. The distal end was approximated to the tibial artery.  The patient was given 6000 units of Heparin  intravenously (2 additional boluses of heparin  were given at approximately 1 hour intervals), and this is allowed to circulate for proximally 5 minutes.  The external iliac artery, superficial femoral artery, and profunda femoral artery were then clamped along with any circumflex branches.  Arteriotomy is then made in the hood of the previous PTFE bypass and extended onto the the common femoral artery with an 11 blade and extended with Potts scissors. Arteriotomy is extended allowing the majority the PTFE to be resected opening up the common femoral.  Attention was then turned to the ostia of the profunda femoris which on the angiogram from yesterday demonstrated a greater than 80% ostial lesion.  This is  separate and distinct as the common femoral did not need endarterectomy at all.  The profunda femoris endarterectomy was performed with an eversion technique.  This resulted in removal of the ostial plaque in the profunda which was now wide open.  The proximal extent of the bypass vein was trimmed to the appropriate shape.  The cadaver vein graft was sewn to the common femoral artery in an end-to-side configuration with a running stitch of 5-0 Prolene suture.  Prior to completing the suture line the anastomosis is flushed and subsequently the suture line is completed. Flow was then reestablished to the profunda femoris artery.  The anastomosis is checked for leaks and the vein graft is clamped proximally.  Subsequently, Surgicel was placed around the suture line. Attention was then turned to the distal end of the graft.  I released the clamp and pulsatile bleeding from the graft was evident.  I reclamped the graft near the proximal portion of the calf incision.  The conduit was irrigated with heparinized saline.    Attention was then turned to the peroneal target site. The leg was straightened and final measurements were made. Arteriotomy was made in the peroneal with a 11-blade and extended with Potts scissors.  The distal end of the conduit was spatulated to the appropriate length.  The vein was then sewn to the to the artery in an end-to-side configuration with a running stitch of 6-0 Prolene.  Prior to completing the anastomosis, flushing maneuvers were performed and then flow was established distally.  After inspecting the anastomosis for hemostasis, Surgicel was placed around the suture line.   The wounds were then irrigated with Vashe he.  The wounds were then inspected for hemostasis. Bleeding points were controlled with electrocautery, and suture repair of active bleeding points.  Fibrillar and hemoblast were then placed in the bed of the wounds. Once hemostasis was achieved.  Antibiotic impregnated  beads with 1 g of vancomycin  and 100 mg of gentamicin  were then placed in both of the incisions.  Celebrate was then placed in both incisions as well.  The groin incision was then closed in multiple layers using both 2-0 and 3-0 Vicryl in interrupted and running fashion. Skin was closed with a staples. The medial calf and medial thigh wound were then closed layers using  3-0 Vicryl, and then interrupted vertical mattress sutures of 3-0 nylon interspersed with staples.   A honeycomb dressing was applied to each incision.   COMPLICATIONS: None  CONDITION: Improved  Cordella KANDICE Shawl, M.D. [facility] Office: [phone]  03/21/2024, 1:19 PM

## 2024-03-22 ENCOUNTER — Encounter: Payer: Self-pay | Admitting: Vascular Surgery

## 2024-03-22 LAB — CBC
HCT: 23.3 % — ABNORMAL LOW (ref 36.0–46.0)
Hemoglobin: 7.6 g/dL — ABNORMAL LOW (ref 12.0–15.0)
MCH: 29.7 pg (ref 26.0–34.0)
MCHC: 32.6 g/dL (ref 30.0–36.0)
MCV: 91 fL (ref 80.0–100.0)
Platelets: 228 10*3/uL (ref 150–400)
RBC: 2.56 MIL/uL — ABNORMAL LOW (ref 3.87–5.11)
RDW: 17.4 % — ABNORMAL HIGH (ref 11.5–15.5)
WBC: 12.6 10*3/uL — ABNORMAL HIGH (ref 4.0–10.5)
nRBC: 0 % (ref 0.0–0.2)

## 2024-03-22 LAB — GLUCOSE, CAPILLARY
Glucose-Capillary: 210 mg/dL — ABNORMAL HIGH (ref 70–99)
Glucose-Capillary: 138 mg/dL — ABNORMAL HIGH (ref 70–99)
Glucose-Capillary: 187 mg/dL — ABNORMAL HIGH (ref 70–99)
Glucose-Capillary: 177 mg/dL — ABNORMAL HIGH (ref 70–99)

## 2024-03-22 LAB — BASIC METABOLIC PANEL WITH GFR
Anion gap: 12 (ref 5–15)
BUN: 10 mg/dL (ref 6–20)
CO2: 20 mmol/L — ABNORMAL LOW (ref 22–32)
Calcium: 8.1 mg/dL — ABNORMAL LOW (ref 8.9–10.3)
Chloride: 105 mmol/L (ref 98–111)
Creatinine, Ser: 0.57 mg/dL (ref 0.44–1.00)
GFR, Estimated: >60 mL/min
Glucose, Bld: 238 mg/dL — ABNORMAL HIGH (ref 70–99)
Potassium: 3.7 mmol/L (ref 3.5–5.1)
Sodium: 137 mmol/L (ref 135–145)

## 2024-03-22 MED ORDER — CHLORHEXIDINE GLUCONATE CLOTH 2 % EX PADS
6.0000 | MEDICATED_PAD | Freq: Every day | CUTANEOUS | Status: AC
  Administered 2024-03-22: 6 via TOPICAL

## 2024-03-22 MED ORDER — ACETAMINOPHEN 500 MG PO TABS
1000.0000 mg | ORAL_TABLET | Freq: Four times a day (QID) | ORAL | Status: AC | PRN
  Administered 2024-03-22: 1000 mg via ORAL
  Filled 2024-03-22: qty 2

## 2024-03-22 MED ORDER — ORAL CARE MOUTH RINSE: 15.0000 mL | OROMUCOSAL | Status: AC | PRN

## 2024-03-22 NOTE — Anesthesia Postprocedure Evaluation (Signed)
"   Anesthesia Post Note  Patient: Claudia Mills  Procedure(s) Performed: CREATION, BYPASS, ARTERIAL, FEMORAL TO PERONEAL, USING GRAFT (Left) APPLICATION OF CELL SAVER ENDARTERECTOMY, FEMORAL  Patient location during evaluation: SICU Anesthesia Type: General Level of consciousness: awake, awake and alert and oriented Pain management: pain level controlled Vital Signs Assessment: post-procedure vital signs reviewed and stable Respiratory status: spontaneous breathing Cardiovascular status: stable Postop Assessment: no apparent nausea or vomiting Anesthetic complications: no   No notable events documented.   Last Vitals:  Vitals:   03/22/24 0600 03/22/24 0700  BP: 127/75 (!) 145/69  Pulse: 88 86  Resp: 15 14  Temp:  36.5 C  SpO2: 100% 99%    Last Pain:  Vitals:   03/22/24 0700  TempSrc:   PainSc: 4                  Deshaun Weisinger B Kamaury Cutbirth      "

## 2024-03-22 NOTE — Evaluation (Signed)
 Occupational Therapy Evaluation Patient Details Name: Claudia Mills MRN: [id number] DOB: 05-01-1970 Today's Date: 03/22/2024   History of Present Illness   Pt is a 54 y/o F admitted on 03/15/24 after presenting with c/o L foot pain. Pt underwent LLE angiogram (03/19/24) with angioplasty & stent placement (03/20/24). Pt is s/p femoral-peroneal bypass (03/21/24). PMH: CAD, NSTEMI s/p stent mid LAD, HTN, DM, neuropathy, PAD with revascularization 10/2023, tick bite     Clinical Impressions Chart reviewed to date, pt greeted semi supine in bed, agreeable to OT evaluation. PTA pt is generally MOD I-I in ADL/IADL, amb with rollator PRN community distances (no AD in house). Pt presents with deficits in strength, endurance, activity tolerance, balance, affecting safe and optimal ADL completion. MAX A required for LB dressing (edu re compensatory techniques), amb in room with RW approx 15'. Honeycomb on lower leg with blood on distal portion of dressing pre session, appears the same post session, nurse notified. VSS throughout. Pt will benefit from acute OT to address deficits/facilitate optimal ADL/functional mobility engagement.      If plan is discharge home, recommend the following:   A little help with walking and/or transfers;A little help with bathing/dressing/bathroom;Assist for transportation;Help with stairs or ramp for entrance     Functional Status Assessment   Patient has had a recent decline in their functional status and demonstrates the ability to make significant improvements in function in a reasonable and predictable amount of time.     Equipment Recommendations   BSC/3in1;Other (comment) (2WW)     Recommendations for Other Services         Precautions/Restrictions   Precautions Precautions: Fall Recall of Precautions/Restrictions: Intact Precaution/Restrictions Comments: post op shoe on L Restrictions Weight Bearing Restrictions Per Provider Order: No      Mobility Bed Mobility Overal bed mobility: Needs Assistance Bed Mobility: Supine to Sit, Sit to Supine     Supine to sit: Modified independent (Device/Increase time), HOB elevated, Used rails Sit to supine: Min assist, Used rails   General bed mobility comments: for management of BLE    Transfers Overall transfer level: Needs assistance Equipment used: Rolling walker (2 wheels) Transfers: Sit to/from Stand Sit to Stand: Contact guard assist                  Balance Overall balance assessment: Needs assistance Sitting-balance support: Feet supported Sitting balance-Leahy Scale: Good     Standing balance support: During functional activity, Bilateral upper extremity supported Standing balance-Leahy Scale: Fair                             ADL either performed or assessed with clinical judgement   ADL Overall ADL's : Needs assistance/impaired                     Lower Body Dressing: Maximal assistance Lower Body Dressing Details (indicate cue type and reason): donn/doff post op shoe Toilet Transfer: Contact guard assist;Ambulation;Rolling walker (2 wheels) Toilet Transfer Details (indicate cue type and reason): simulated         Functional mobility during ADLs: Contact guard assist;Rolling walker (2 wheels);Cueing for sequencing (approx 15' in room)       Vision Patient Visual Report: No change from baseline       Perception         Praxis         Pertinent Vitals/Pain Pain Assessment Pain Assessment: 0-10 Pain Score: 3  Pain Location: L leg Pain Descriptors / Indicators: Discomfort Pain Intervention(s): Repositioned, Monitored during session     Extremity/Trunk Assessment Upper Extremity Assessment Upper Extremity Assessment: Overall WFL for tasks assessed   Lower Extremity Assessment Lower Extremity Assessment: LLE deficits/detail LLE: Unable to fully assess due to pain LLE Coordination: decreased gross motor        Communication Communication Communication: No apparent difficulties   Cognition Arousal: Alert Behavior During Therapy: WFL for tasks assessed/performed Cognition: No apparent impairments                               Following commands: Intact       Cueing  General Comments   Cueing Techniques: Verbal cues  Educated pt on use of post op shoe & how to don it - messaged MD to see if pt still needs to wear it with mobility.   Exercises Other Exercises Other Exercises: edu re role of OT, role of rehab, discharge recommendations   Shoulder Instructions      Home Living Family/patient expects to be discharged to:: Private residence Living Arrangements: Parent Available Help at Discharge: Family;Available PRN/intermittently Type of Home: Mobile home Home Access: Stairs to enter Entrance Stairs-Number of Steps: 5 Entrance Stairs-Rails: Right;Left;Can reach both Home Layout: One level     Bathroom Shower/Tub: Chief Strategy Officer: Standard     Home Equipment: Rollator (4 wheels)          Prior Functioning/Environment Prior Level of Function : Independent/Modified Independent;Driving             Mobility Comments: amb with rollator ADLs Comments: pt amb mostly indep with ADL/IADL    OT Problem List: Decreased strength;Decreased activity tolerance;Impaired balance (sitting and/or standing);Decreased knowledge of precautions;Decreased knowledge of use of DME or AE   OT Treatment/Interventions: Self-care/ADL training;Therapeutic exercise;Energy conservation;DME and/or AE instruction;Patient/family education;Therapeutic activities      OT Goals(Current goals can be found in the care plan section)   Acute Rehab OT Goals Patient Stated Goal: improve function OT Goal Formulation: With patient Time For Goal Achievement: 04/05/24 Potential to Achieve Goals: Good ADL Goals Pt Will Perform Grooming: with modified  independence;sitting;standing Pt Will Perform Lower Body Dressing: with modified independence;sitting/lateral leans;sit to/from stand Pt Will Transfer to Toilet: with modified independence;ambulating Pt Will Perform Toileting - Clothing Manipulation and hygiene: with modified independence;sitting/lateral leans;sit to/from stand   OT Frequency:  Min 2X/week    Co-evaluation              AM-PAC OT 6 Clicks Daily Activity     Outcome Measure Help from another person eating meals?: None Help from another person taking care of personal grooming?: None Help from another person toileting, which includes using toliet, bedpan, or urinal?: None Help from another person bathing (including washing, rinsing, drying)?: A Little Help from another person to put on and taking off regular upper body clothing?: None Help from another person to put on and taking off regular lower body clothing?: A Lot 6 Click Score: 21   End of Session Equipment Utilized During Treatment: Rolling walker (2 wheels) Nurse Communication: Mobility status  Activity Tolerance: Patient tolerated treatment well Patient left: in bed;with call bell/phone within reach  OT Visit Diagnosis: Other abnormalities of gait and mobility (R26.89)                Time: 1044-1100 OT Time Calculation (min): 16 min Charges:  OT  General Charges $OT Visit: 1 Visit OT Evaluation $OT Eval Moderate Complexity: 1 Mod  Therisa Sheffield, OTD OTR/L  03/22/24, 1:13 PM

## 2024-03-22 NOTE — Progress Notes (Signed)
 SPIRITUAL CARE AND COUNSELING CONSULT NOTE   VISIT SUMMARY Follow-up ICU visit with patient from Ortho initial visit earlier in the week.   SPIRITUAL ENCOUNTER                                                                                                                                                                      Type of Visit: Follow up Care provided to:: Pt and family Conversation partners present during encounter: Nurse Reason for visit: Surgical OnCall Visit: No   SPIRITUAL FRAMEWORK  Presenting Themes: Goals in life/care, Caregiving needs, Community and relationships Community/Connection: Family Strengths: sense of humor and connection to family Needs/Challenges/Barriers: need rehab proximity to mom who is 55 years old. Patient Stress Factors: Family relationships, Health changes Family Stress Factors: Family relationships   GOALS   Self/Personal Goals: Talk with son whose birthday is today. Clinical Care Goals: Discharge home or rehab close to mom   INTERVENTIONS   Spiritual Care Interventions Made: Compassionate presence, Reflective listening, Encouragement    INTERVENTION OUTCOMES   Outcomes: Awareness of health, Awareness of support  SPIRITUAL CARE PLAN   Spiritual Care Issues Still Outstanding: Chaplain will continue to follow    If immediate needs arise, please contact [facility] 24 hour on call [phone]   Barnie JINNY Record, Chaplain  03/22/2024 1:29 PM

## 2024-03-22 NOTE — Progress Notes (Addendum)
 [facility]  - [place] [facility]   PATIENT NAME: Claudia Mills    MR#:  [id number]  DATE OF BIRTH:  12-Oct-1970  SUBJECTIVE:  Pt in ICU working with OT this AM. She reports overall feeling well.  No acute complaints including pain, dyspnea, chest pain. No acute events reported.  OT pointed out inferior left leg dressing with some mild bleeding visualized since getting the patient up to edge of bed.  VITALS:  Blood pressure 139/60, pulse 94, temperature 97.7 F (36.5 C), resp. rate 15, height 5' 3 (1.6 m), weight 59.5 kg, last menstrual period 09/29/2015, SpO2 100%.  PHYSICAL EXAMINATION:   General exam: awake & alert, no acute distress Respiratory system: CTAB, no wheezes, rales or rhonchi, normal respiratory effort. Cardiovascular system: normal S1/S2, RRR  Gastrointestinal system: soft, NT, ND Central nervous system: exam limited as pt sedated post-operatively Extremities: left surgical sites with intact dressings - inferior left leg dressing with visible fresh blood at bottom, no peripheral edema Skin: dry, intact, normal temperature     LABORATORY PANEL:  CBC Recent Labs  Lab 03/22/24 0344  WBC 12.6*  HGB 7.6*  HCT 23.3*  PLT 228    Chemistries  Recent Labs  Lab 03/15/24 1650 03/18/24 0405 03/22/24 0344  NA 131*   < > 137  K 3.9   < > 3.7  CL 94*   < > 105  CO2 24   < > 20*  GLUCOSE 240*   < > 238*  BUN 5*   < > 10  CREATININE 0.69   < > 0.57  CALCIUM  10.0   < > 8.1*  AST 28  --   --   ALT 25  --   --   ALKPHOS 140*  --   --   BILITOT 0.3  --   --    < > = values in this interval not displayed.    Assessment and Plan Claudia Mills is a 54 y.o. female with a PMH significant for CAD with history of NSTEMI s/p stent mid LAD, HTN, DM with neuropathy, PAD with prior revascularization 10/2023, currently with dry gangrene of toes left foot, ongoing rest pain,chronic nonhealing surgical wound due to suspected narrowing of prior bypass,   being admitted with suspected cellulitis due to wound infection as well as limb ischemia.   Chest x-ray nonacute Left foot x-ray showing soft tissue ulceration about the 1st and 2nd toes without evidence of osteomyelitis   Critical limb ischemia of left lower extremity with rest pain (HCC) --Managed with heparin  infusion --2/2--Vascular surgery tentatively planning an intervention next week -- podiatry input appreciated. Considering left first and second toe amputation wants intervention done by vascular --Per podiatry--dry gangrene--ok to d/c abxs --2/3--left foot angiogram per Dr schnier --2/4-- status post left lower extremity angioplasty with stent placement and left profunda for [name] artery, left external iliac artery and mechanical thrombectomy left external iliac artery --2/5-- pt underwent femoral-peroneal bypass today with Drs Jama and Marea --Changed to aspirin  and Plavix  today -- Per Dr. Tanda Podiatry no indication for amputation given PAD worried about stump healing. -- PT started. Patient walked 20 feet with postop boot.  -- Home health will be arranged. DME ordered.  --2/6 -- awaiting seen by vascular surgery today, POD-1 Mild amount of bleeding at inferior dressing working with OT   Nonhealing surgical wound with infection , initial encounter Gangrene toes of left foot PAD s/p bypass 10/2023 with chronic nonhealing surgical  wound --anticoagulation / antiplatelets per vascular sx --now off heparin  infusion  --changed to ASA +plavix  from 2/6 --Pain control   Uncontrolled type 2 diabetes mellitus with hyperglycemia (HCC) --Sliding scale insulin  coverage -- start patient on glipizide  XL on dc or once no further procedures   CAD S/P percutaneous coronary angioplasty --No complaints of chest pain --Continue lisinopril  atorvastatin , aspirin  with nitroglycerin  sublingual as needed   Essential hypertension --Continue lisinopril     Procedures: Family  communication :none at bedside Consults :Vascular, podiatry CODE STATUS: full DVT Prophylaxis: heparin  gtt   Level of care: Progressive Status is: Inpatient Remains inpatient appropriate because: pending clearance by specialist/s for d/c    TOTAL TIME TAKING CARE OF THIS PATIENT: 40 minutes.  >50% time spent on counselling and coordination of care  Note: This dictation was prepared with Dragon dictation along with smaller phrase technology. Any transcriptional errors that result from this process are unintentional.  Burnard DELENA Cunning, DO   [facility]   CC: Primary care physician; Lorel Maxie DELENA, MD

## 2024-03-22 NOTE — Plan of Care (Signed)
" °  Problem: Education: Goal: Ability to describe self-care measures that may prevent or decrease complications (Diabetes Survival Skills Education) will improve Outcome: Progressing Goal: Individualized Educational Video(s) Outcome: Progressing   Problem: Coping: Goal: Ability to adjust to condition or change in health will improve Outcome: Progressing   Problem: Fluid Volume: Goal: Ability to maintain a balanced intake and output will improve Outcome: Progressing   Problem: Health Behavior/Discharge Planning: Goal: Ability to identify and utilize available resources and services will improve Outcome: Progressing Goal: Ability to manage health-related needs will improve Outcome: Progressing   Problem: Metabolic: Goal: Ability to maintain appropriate glucose levels will improve Outcome: Progressing   Problem: Nutritional: Goal: Maintenance of adequate nutrition will improve Outcome: Progressing Goal: Progress toward achieving an optimal weight will improve Outcome: Progressing   Problem: Skin Integrity: Goal: Risk for impaired skin integrity will decrease Outcome: Progressing   Problem: Tissue Perfusion: Goal: Adequacy of tissue perfusion will improve Outcome: Progressing   Problem: Education: Goal: Knowledge of General Education information will improve Description: Including pain rating scale, medication(s)/side effects and non-pharmacologic comfort measures Outcome: Progressing   Problem: Health Behavior/Discharge Planning: Goal: Ability to manage health-related needs will improve Outcome: Progressing   Problem: Clinical Measurements: Goal: Ability to maintain clinical measurements within normal limits will improve Outcome: Progressing Goal: Will remain free from infection Outcome: Progressing Goal: Diagnostic test results will improve Outcome: Progressing Goal: Respiratory complications will improve Outcome: Progressing Goal: Cardiovascular complication will  be avoided Outcome: Progressing   Problem: Activity: Goal: Risk for activity intolerance will decrease Outcome: Progressing   Problem: Nutrition: Goal: Adequate nutrition will be maintained Outcome: Progressing   Problem: Coping: Goal: Level of anxiety will decrease Outcome: Progressing   Problem: Elimination: Goal: Will not experience complications related to bowel motility Outcome: Progressing Goal: Will not experience complications related to urinary retention Outcome: Progressing   Problem: Pain Managment: Goal: General experience of comfort will improve and/or be controlled Outcome: Progressing   Problem: Safety: Goal: Ability to remain free from injury will improve Outcome: Progressing   Problem: Skin Integrity: Goal: Risk for impaired skin integrity will decrease Outcome: Progressing   Problem: Education: Goal: Understanding of CV disease, CV risk reduction, and recovery process will improve Outcome: Progressing Goal: Individualized Educational Video(s) Outcome: Progressing   Problem: Activity: Goal: Ability to return to baseline activity level will improve Outcome: Progressing   "

## 2024-03-22 NOTE — Progress Notes (Signed)
 [facility]  Daily Progress Note   Subjective  -   Standing with walker on entrance to room.  She notes the leg feels better but still needing additional pain control   Objective Vitals:   03/22/24 0500 03/22/24 0600 03/22/24 0700 03/22/24 1100  BP: 124/64 127/75 (!) 145/69 139/60  Pulse: 89 88 86 94  Resp: 16 15 14 15   Temp:   97.7 F (36.5 C)   TempSrc:      SpO2: 97% 100% 99% 100%  Weight:      Height:        Intake/Output Summary (Last 24 hours) at 03/22/2024 1435 Last data filed at 03/22/2024 0700 Gross per 24 hour  Intake 200 ml  Output --  Net 200 ml    PULM  CTAB CV  RRR VASC  Some shadowing in calf hone comb, dopplerable pulses on L.  Gangrenous LLE, still stable.   Laboratory CBC    Component Value Date/Time   WBC 12.6 (H) 03/22/2024 0344   HGB 7.6 (L) 03/22/2024 0344   HCT 23.3 (L) 03/22/2024 0344   PLT 228 03/22/2024 0344    BMET    Component Value Date/Time   NA 137 03/22/2024 0344   K 3.7 03/22/2024 0344   CL 105 03/22/2024 0344   CO2 20 (L) 03/22/2024 0344   GLUCOSE 238 (H) 03/22/2024 0344   BUN 10 03/22/2024 0344   CREATININE 0.57 03/22/2024 0344   CREATININE 0.58 09/20/2022 0930   CALCIUM  8.1 (L) 03/22/2024 0344   GFRNONAA >60 03/22/2024 0344   GFRAA >60 12/30/2015 2048    Assessment/Planning: POD #1 s/p left femoral to distal peroneal artery bypass and left profunda endarterectomy  Added tylenol  1000mg  to help with pain control as patient does want IV pain medications  Continue to work with PT Watch small oozing area of calf, reinforce with ABD if needed  Orvin FORBES Daring  03/22/2024, 2:35 PM

## 2024-03-22 NOTE — Evaluation (Signed)
 Physical Therapy Re-Evaluation Patient Details Name: Claudia Mills MRN: [id number] DOB: [birth year] Today's Date: 03/22/2024  History of Present Illness  Pt is a 54 y/o F admitted on 03/15/24 after presenting with c/o L foot pain. Pt underwent LLE angiogram (03/19/24) with angioplasty & stent placement (03/20/24). Pt is s/p femoral-peroneal bypass (03/21/24). PMH: CAD, NSTEMI s/p stent mid LAD, HTN, DM, neuropathy, PAD with revascularization 10/2023, tick bite  Clinical Impression  Pt seen for PT re-evaluation with pt agreeable, family present in room. Pt reports prior to admission she was ambulatory with rollator. On this date, pt is able to complete bed mobility with mod I, ambulate into hallway with RW & CGA. Pt without LOB during gait but demonstrates decreased weight shift to LLE in stance phase. Recommend ongoing PT services to progress mobility as able.      If plan is discharge home, recommend the following: A little help with walking and/or transfers;Assistance with cooking/housework;A little help with bathing/dressing/bathroom;Assist for transportation   Can travel by private vehicle        Equipment Recommendations Rolling walker (2 wheels);BSC/3in1  Recommendations for Other Services       Functional Status Assessment Patient has had a recent decline in their functional status and demonstrates the ability to make significant improvements in function in a reasonable and predictable amount of time.     Precautions / Restrictions Precautions Precautions: Fall Precaution/Restrictions Comments: post op shoe on L Restrictions Weight Bearing Restrictions Per Provider Order: No      Mobility  Bed Mobility Overal bed mobility: Needs Assistance Bed Mobility: Supine to Sit     Supine to sit: Modified independent (Device/Increase time), HOB elevated, Used rails (exit R side of bed)          Transfers Overall transfer level: Needs assistance Equipment used: Rolling walker (2  wheels) Transfers: Sit to/from Stand Sit to Stand: Contact guard assist                Ambulation/Gait Ambulation/Gait assistance: Contact guard assist Gait Distance (Feet): 50 Feet Assistive device: Rolling walker (2 wheels) Gait Pattern/deviations: Decreased step length - right, Decreased step length - left, Decreased stride length, Decreased dorsiflexion - right, Decreased dorsiflexion - left, Decreased stance time - left, Decreased weight shift to left Gait velocity: decreased     General Gait Details: no overt LOB  Stairs            Wheelchair Mobility     Tilt Bed    Modified Rankin (Stroke Patients Only)       Balance Overall balance assessment: Needs assistance Sitting-balance support: Feet supported Sitting balance-Leahy Scale: Good     Standing balance support: During functional activity, Bilateral upper extremity supported Standing balance-Leahy Scale: Fair                               Pertinent Vitals/Pain Pain Assessment Pain Assessment: 0-10 Pain Score: 3  Pain Location: L leg Pain Descriptors / Indicators: Discomfort Pain Intervention(s): Monitored during session    Home Living Family/patient expects to be discharged to:: Private residence Living Arrangements: Parent Available Help at Discharge: Family;Available PRN/intermittently Type of Home: Mobile home Home Access: Stairs to enter Entrance Stairs-Rails: Right;Left;Can reach both Entrance Stairs-Number of Steps: 5   Home Layout: One level Home Equipment: Rollator (4 wheels)      Prior Function Prior Level of Function : Independent/Modified Independent;Driving  Mobility Comments: pt reports using rollator for mobility, denies falls. gait distance limited by leg pain ADLs Comments: Pt states that she is mostly IND with ADLs, drives regularly     Extremity/Trunk Assessment   Upper Extremity Assessment Upper Extremity Assessment: Overall WFL for  tasks assessed    Lower Extremity Assessment Lower Extremity Assessment: LLE deficits/detail LLE: Unable to fully assess due to pain       Communication   Communication Communication: No apparent difficulties    Cognition Arousal: Alert Behavior During Therapy: WFL for tasks assessed/performed   PT - Cognitive impairments: No apparent impairments                         Following commands: Intact       Cueing Cueing Techniques: Verbal cues     General Comments General comments (skin integrity, edema, etc.): Educated pt on use of post op shoe & how to don it - messaged MD to see if pt still needs to wear it with mobility.    Exercises     Assessment/Plan    PT Assessment    PT Problem List Decreased activity tolerance;Decreased mobility;Decreased strength;Decreased skin integrity;Pain;Decreased balance;Decreased knowledge of use of DME;Decreased knowledge of precautions;Decreased safety awareness;Cardiopulmonary status limiting activity;Decreased range of motion       PT Treatment Interventions Therapeutic exercise;DME instruction;Gait training;Balance training;Stair training;Neuromuscular re-education;Functional mobility training;Therapeutic activities;Patient/family education;Modalities    PT Goals (Current goals can be found in the Care Plan section)  Acute Rehab PT Goals Patient Stated Goal: decreased pain, return home PT Goal Formulation: With patient Time For Goal Achievement: 04/05/24 Potential to Achieve Goals: Good    Frequency Min 2X/week     Co-evaluation               AM-PAC PT 6 Clicks Mobility  Outcome Measure Help needed turning from your back to your side while in a flat bed without using bedrails?: None Help needed moving from lying on your back to sitting on the side of a flat bed without using bedrails?: A Little Help needed moving to and from a bed to a chair (including a wheelchair)?: A Little Help needed standing up  from a chair using your arms (e.g., wheelchair or bedside chair)?: A Little Help needed to walk in hospital room?: A Little Help needed climbing 3-5 steps with a railing? : A Little 6 Click Score: 19    End of Session   Activity Tolerance: Patient tolerated treatment well;Patient limited by pain Patient left: in chair;with call bell/phone within reach;with family/visitor present Nurse Communication: Mobility status PT Visit Diagnosis: Other abnormalities of gait and mobility (R26.89);Pain;Difficulty in walking, not elsewhere classified (R26.2);Muscle weakness (generalized) (M62.81) Pain - Right/Left: Left Pain - part of body: Leg    Time: 8866-8853 PT Time Calculation (min) (ACUTE ONLY): 13 min   Charges:   PT Evaluation $PT Re-evaluation: 1 Re-eval   PT General Charges $$ ACUTE PT VISIT: 1 Visit         Richerd Pinal, PT, DPT 03/22/24, 12:00 PM   Richerd CHRISTELLA Pinal 03/22/2024, 12:00 PM
